# Patient Record
Sex: Male | Born: 1950
Health system: Southern US, Community
[De-identification: ages and names within clinical notes are randomized; demographics above are authoritative.]

## PROBLEM LIST (undated history)

## (undated) DIAGNOSIS — C7800 Secondary malignant neoplasm of unspecified lung: Secondary | ICD-10-CM

## (undated) DIAGNOSIS — C801 Malignant (primary) neoplasm, unspecified: Secondary | ICD-10-CM

## (undated) DIAGNOSIS — C787 Secondary malignant neoplasm of liver and intrahepatic bile duct: Secondary | ICD-10-CM

---

## 2018-07-20 DIAGNOSIS — K625 Hemorrhage of anus and rectum: Secondary | ICD-10-CM | POA: Diagnosis not present

## 2018-07-20 DIAGNOSIS — K5900 Constipation, unspecified: Secondary | ICD-10-CM | POA: Diagnosis not present

## 2018-07-20 DIAGNOSIS — K6289 Other specified diseases of anus and rectum: Secondary | ICD-10-CM | POA: Diagnosis not present

## 2018-07-28 ENCOUNTER — Other Ambulatory Visit: Payer: Self-pay | Admitting: Gastroenterology

## 2018-07-28 ENCOUNTER — Ambulatory Visit
Admission: RE | Admit: 2018-07-28 | Discharge: 2018-07-28 | Disposition: A | Discharge status: Home / Self Care | Payer: Self-pay | Source: Ambulatory Visit | Attending: Gastroenterology | Admitting: Gastroenterology

## 2018-07-28 DIAGNOSIS — R933 Abnormal findings on diagnostic imaging of other parts of digestive tract: Secondary | ICD-10-CM

## 2018-07-28 DIAGNOSIS — R918 Other nonspecific abnormal finding of lung field: Secondary | ICD-10-CM | POA: Diagnosis not present

## 2018-07-28 DIAGNOSIS — K769 Liver disease, unspecified: Secondary | ICD-10-CM | POA: Diagnosis not present

## 2018-07-28 DIAGNOSIS — K921 Melena: Secondary | ICD-10-CM | POA: Diagnosis not present

## 2018-07-28 DIAGNOSIS — D122 Benign neoplasm of ascending colon: Secondary | ICD-10-CM | POA: Diagnosis not present

## 2018-07-28 DIAGNOSIS — K5900 Constipation, unspecified: Secondary | ICD-10-CM | POA: Diagnosis not present

## 2018-07-28 DIAGNOSIS — K625 Hemorrhage of anus and rectum: Secondary | ICD-10-CM | POA: Diagnosis not present

## 2018-07-28 DIAGNOSIS — K635 Polyp of colon: Secondary | ICD-10-CM | POA: Diagnosis not present

## 2018-07-28 DIAGNOSIS — C20 Malignant neoplasm of rectum: Secondary | ICD-10-CM | POA: Diagnosis not present

## 2018-07-28 MED ORDER — IOPAMIDOL (ISOVUE-300) INJECTION 61%
125.0000 mL | Freq: Once | INTRAVENOUS | Status: AC | PRN
  Administered 2018-07-28: 125 mL via INTRAVENOUS

## 2018-08-03 ENCOUNTER — Telehealth: Payer: Self-pay | Admitting: Hematology

## 2018-08-03 NOTE — Telephone Encounter (Signed)
Lft vm to schedule an appt

## 2018-08-04 ENCOUNTER — Inpatient Hospital Stay: Payer: BLUE CROSS/BLUE SHIELD | Attending: Hematology | Admitting: Hematology

## 2018-08-04 ENCOUNTER — Encounter: Payer: Self-pay | Admitting: *Deleted

## 2018-08-04 ENCOUNTER — Encounter: Payer: Self-pay | Admitting: Hematology

## 2018-08-04 VITALS — BP 180/110 | HR 82 | Temp 98.7°F | Resp 17 | Ht 73.0 in | Wt 253.7 lb

## 2018-08-04 DIAGNOSIS — C20 Malignant neoplasm of rectum: Secondary | ICD-10-CM | POA: Diagnosis not present

## 2018-08-04 DIAGNOSIS — C787 Secondary malignant neoplasm of liver and intrahepatic bile duct: Secondary | ICD-10-CM

## 2018-08-04 DIAGNOSIS — Z7189 Other specified counseling: Secondary | ICD-10-CM

## 2018-08-04 DIAGNOSIS — Z006 Encounter for examination for normal comparison and control in clinical research program: Secondary | ICD-10-CM

## 2018-08-04 DIAGNOSIS — Z23 Encounter for immunization: Secondary | ICD-10-CM | POA: Diagnosis not present

## 2018-08-04 MED ORDER — INFLUENZA VAC SPLIT QUAD 0.5 ML IM SUSY
0.5000 mL | PREFILLED_SYRINGE | Freq: Once | INTRAMUSCULAR | Status: AC
  Administered 2018-08-04: 0.5 mL via INTRAMUSCULAR

## 2018-08-04 MED ORDER — LIDOCAINE-PRILOCAINE 2.5-2.5 % EX CREA: TOPICAL_CREAM | CUTANEOUS | 3 refills | Status: DC

## 2018-08-04 MED ORDER — INFLUENZA VAC SPLIT QUAD 0.5 ML IM SUSY
PREFILLED_SYRINGE | INTRAMUSCULAR | Status: AC
  Filled 2018-08-04: qty 0.5

## 2018-08-04 MED ORDER — ONDANSETRON HCL 8 MG PO TABS: 8.0000 mg | ORAL_TABLET | Freq: Two times a day (BID) | ORAL | 1 refills | Status: DC | PRN

## 2018-08-04 MED ORDER — PROCHLORPERAZINE MALEATE 10 MG PO TABS: 10.0000 mg | ORAL_TABLET | Freq: Four times a day (QID) | ORAL | 1 refills | Status: DC | PRN

## 2018-08-04 MED ORDER — CAPECITABINE 500 MG PO TABS: 850.0000 mg/m2 | ORAL_TABLET | Freq: Two times a day (BID) | ORAL | 0 refills | Status: DC

## 2018-08-04 MED ORDER — ACETAMINOPHEN-CODEINE #3 300-30 MG PO TABS: 1.0000 | ORAL_TABLET | ORAL | 0 refills | Status: DC | PRN

## 2018-08-04 NOTE — Progress Notes (Signed)
[place]  [phone] [phone]) [phone]  Clinic New Consult Note   Patient Care Team: Merry Proud, MD as PCP - General (Internal Medicine)   Date of Service:  08/04/2018   REFERRAL PHYSICIAN: DR. Benson Norway   CHIEF COMPLAINTS/PURPOSE OF CONSULTATION:  Newly diagnosed rectal Cancer   HISTORY OF PRESENTING ILLNESS:  Billy Mendoza 67 y.o. male is a here because of newly diagnosed rectal cancer. The patient was referred by Dr. Benson Norway. The patient presents to the clinic today accompanied by his wife Billy Mendoza, who is a Marine scientist.  He presented with rectal bleeding for one year, initially was on and off, overall mild, he thought was hemorrhoids.  It has been getting worse lately in the past 2 months, he has constipation also. He has also developed some rectal pain, constant, 4/10 on pain scale, on tylenol 2-4 times a day, worse at night, which wakes him up, but he is able to fall back sleep. No nausea, abdominal pain or bloating.  He also reports frequent night sweats in the past week, no fever or chills.  He had a dry cough last week, mild, improved now, he took some Mucinex last week. He had intentional weight loss for 25 lbs over 3 months.  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   Due to the worsening rectal bleeding, he was referred to gastroenterologist Dr. Almyra Free, underwent colonoscopy on July 28, 2018.  It showed a malignant partially obstructing tumor in the rectum, 8 cm from anal verge, biopsied.  Labs showed invasive adenocarcinoma.  He underwent staging CT chest, abdomen and pelvis with contrast, which unfortunately showed multiple small lung nodules, and several liver lesions, highly suspicious for metastasis.  He has no significant past medical history.  He is married, lives with his wife.  He has 2 adopted daughters, who lives in  [place].  They come in to visit him in a few weeks.  MEDICAL HISTORY:  History reviewed. No pertinent past medical history.  SURGICAL HISTORY: History reviewed. No pertinent surgical history.  SOCIAL HISTORY: Social History   Socioeconomic History  . Marital status: Married    Spouse name: Not on file  . Number of children: Not on file  . Years of education: Not on file  . Highest education level: Not on file  Occupational History  . Not on file  Social Needs  . Financial resource strain: Not on file  . Food insecurity:    Worry: Not on file    Inability: Not on file  . Transportation needs:    Medical: Not on file    Non-medical: Not on file  Tobacco Use  . Smoking status: Former Smoker    Years: 10.00    Types: Cigarettes, Cigars  . Smokeless tobacco: Never Used  . Tobacco comment: quit in Sep 2019   Substance and Sexual Activity  . Alcohol use: Yes    Alcohol/week: 1.0 - 2.0 standard drinks    Types: 1 - 2 Shots of liquor per week  . Drug use: Not on file  . Sexual activity: Not on file  Lifestyle  . Physical activity:    Days per week: Not on file    Minutes per session: Not on file  . Stress: Not on file  Relationships  . Social connections:    Talks on phone: Not on file    Gets together:  Not on file    Attends religious service: Not on file    Active member of club or organization: Not on file    Attends meetings of clubs or organizations: Not on file    Relationship status: Not on file  . Intimate partner violence:    Fear of current or ex partner: Not on file    Emotionally abused: Not on file    Physically abused: Not on file    Forced sexual activity: Not on file  Other Topics Concern  . Not on file  Social History Narrative  . Not on file    FAMILY HISTORY: Family History  Problem Relation Age of Onset  . Cancer Mother 43       breast cancer  . Atrial fibrillation Father   . Heart failure Father   . Cancer Maternal Grandmother         lung cancer     ALLERGIES:  has No Known Allergies.  MEDICATIONS:  Current Outpatient Medications  Medication Sig Dispense Refill  . acetaminophen (TYLENOL) 325 MG tablet Take 650 mg by mouth every 6 (six) hours as needed.    Marland Kitchen ibuprofen (ADVIL,MOTRIN) 200 MG tablet Take 200 mg by mouth every 6 (six) hours as needed.    . polyethylene glycol (MIRALAX / GLYCOLAX) packet Take 17 g by mouth daily.    Marland Kitchen acetaminophen-codeine (TYLENOL #3) 300-30 MG tablet Take 1 tablet by mouth every 4 (four) hours as needed for moderate pain. 20 tablet 0   No current facility-administered medications for this visit.     REVIEW OF SYSTEMS:    Constitutional: Denies fevers, chills or abnormal night sweats (+) mild fatigue, and 25 pounds intentional weight loss Eyes: Denies blurriness of vision, double vision or watery eyes Ears, nose, mouth, throat, and face: Denies mucositis or sore throat Respiratory: Deniesyspnea or wheezes, mild dry cough last week  Cardiovascular: Denies palpitation, chest discomfort or lower extremity swelling Gastrointestinal:  See HPI  Skin: Denies abnormal skin rashes Lymphatics: Denies new lymphadenopathy or easy bruising Neurological:Denies numbness, tingling or new weaknesses Behavioral/Psych: Mood is stable, no new changes  All other systems were reviewed with the patient and are negative.  PHYSICAL EXAMINATION: ECOG PERFORMANCE STATUS: 1 - Symptomatic but completely ambulatory  Vitals:   08/04/18 1445  BP: (!) 180/110  Pulse: 82  Resp: 17  Temp: 98.7 F (37.1 C)  SpO2: 95%   Filed Weights   08/04/18 1445  Weight: 253 lb 11.2 oz (115.1 kg)   GENERAL:alert, no distress and comfortable SKIN: skin color, texture, turgor are normal, no rashes or significant lesions EYES: normal, conjunctiva are pink and non-injected, sclera clear OROPHARYNX:no exudate, no erythema and lips, buccal mucosa, and tongue normal  NECK: supple, thyroid normal size, non-tender, without  nodularity LYMPH:  no palpable lymphadenopathy in the cervical, axillary or inguinal LUNGS: clear to auscultation and percussion with normal breathing effort HEART: regular rate & rhythm and no murmurs and no lower extremity edema ABDOMEN:abdomen soft, non-tender and normal bowel sounds Musculoskeletal:no cyanosis of digits and no clubbing  PSYCH: alert & oriented x 3 with fluent speech NEURO: no focal motor/sensory deficits  LABORATORY DATA:  I have reviewed the data as listed Outside lab on July 28, 2018  CEA 108 WBC 8.5, hemoglobin 12.7, hematocrit 36.6, MCV 91, platelet 404  PATHOLOGY   Biopsy 07/28/18 Final Microscopic Diagnosis A.Colon, Ascending, Polyp:  -Fragments of tubular adenoma -No high-grade dysplasia of malignancy.  B. Rectum, Mass, Polyp:  -  Invasive adenocarcinoma, moderately - poorly differentiatred.        PROCEDURES  Colonoscopy by Dr. Benson Norway 07/28/18 IMPRESSION:  -one 16m polyp in the ascending colon, removed with a cold snare. Resected and retrieved. -Malignant partiallt obstructing tumur in teh rectum. Biopsied -Diverticulosis in teh sigmoid colon.     RADIOGRAPHIC STUDIES: I have personally reviewed the radiological images as listed and agreed with the findings in the report. Ct Chest W Contrast  Result Date: 07/28/2018 CLINICAL DATA:  Patient with mass found on colonoscopy. EXAM: CT CHEST, ABDOMEN, AND PELVIS WITH CONTRAST TECHNIQUE: Multidetector CT imaging of the chest, abdomen and pelvis was performed following the standard protocol during bolus administration of intravenous contrast. [place] [address] Results: Creatinine 0.8 mg/dL. CONTRAST:  1265mISOVUE-300 IOPAMIDOL (ISOVUE-300) INJECTION 61% COMPARISON:  None. FINDINGS: CT CHEST FINDINGS Cardiovascular: Normal heart size. No pericardial effusion. Thoracic aortic vascular calcifications. Mediastinum/Nodes: No axillary adenopathy.  There is a 2.4 cm subcarinal lymph node (image 34; series 2). There is a 2.6 cm right hilar node (image 31; series 2). Normal appearance of the esophagus. There is a 1.3 cm left paratracheal node (image 25; series 2). Lungs/Pleura: Central airways are patent. Multiple bilateral pulmonary nodules are demonstrated. Reference subpleural 0.5 cm left lower lobe nodule (image 73; series 4). 0.3 cm nodule in the lingula (image 80; series 4). 0.8 cm right middle lobe nodule (image 88; series 4). 1.0 cm right lower lobe nodule (image 101; series 4). 0.7 cm right lower lobe nodule (image 110; series 4). 3.6 x 2.2 cm masslike area of consolidation subpleural right lower lobe (image 118; series 4). No pleural effusion or pneumothorax. Musculoskeletal: No aggressive or acute appearing osseous lesions. CT ABDOMEN PELVIS FINDINGS Hepatobiliary: Liver is normal in size and contour. Multiple low-attenuation lesions are demonstrated throughout the left and right hepatic lobes. Reference 1.8 x 1.9 cm lesion left hepatic lobe (image 55; series 2). Reference 7.2 x 5.6 cm lesion hepatic dome (image 45; series 2). Reference 1.8 x 2.1 cm lesion right hepatic lobe (image 58; series 2). Gallbladder is unremarkable. No intrahepatic or extrahepatic biliary ductal dilatation. Pancreas: Unremarkable Spleen: Unremarkable Adrenals/Urinary Tract: Normal adrenal glands. Kidneys enhance symmetrically with contrast. Multiple bilateral renal cysts are demonstrated. Additionally there are subcentimeter too small to characterize low-attenuation renal lesions. There is a 2.3 cm exophytic lesion off the interpolar region of the right kidney (image 72; series 2) with an internal density of 17 Hounsfield units, favored represent a mildly complicated cyst. No hydronephrosis. Urinary bladder is unremarkable. Stomach/Bowel: There is a heterogeneously enhancing mass involving the rectum which measures 5.0 x 3.5 cm (image 121; series 2). There is a 1.0 cm  pericolonic lymph node (image 116; series 2). There multiple smaller pericolonic nodes. Stranding and small amount of fluid about the rectum within the pelvis. No upstream bowel obstruction. Normal appendix. No evidence for small bowel obstruction. No free fluid or free intraperitoneal air. Normal morphology of the stomach. Vascular/Lymphatic: Normal caliber abdominal aorta. Peripheral calcified atherosclerotic plaque. No retroperitoneal lymphadenopathy. Reproductive: Heterogeneous calcifications in the prostate. Other: Small bilateral fat containing inguinal hernias. Musculoskeletal: Lumbar spine degenerative changes. No aggressive or acute appearing osseous lesions. IMPRESSION: 1. Heterogeneous mass at the level of the rectum most compatible with primary colonic carcinoma. There is adjacent fat stranding and small lymph nodes within the pelvis. 2. Multiple lesions within the liver compatible with hepatic metastatic disease. 3. Multiple pulmonary nodules compatible with pulmonary  metastatic disease. 4. Mediastinal adenopathy compatible with metastatic adenopathy. 5. There is a masslike area of subpleural consolidation within the right lower lobe which may represent metastatic disease, infectious/inflammatory process or potentially primary pulmonary malignancy. 6. There is a 2.3 cm exophytic lesion off the interpolar region the right kidney with internal density greater than that of fluid which may represent a complicated cyst. Consider performing follow-up exam with pre and post contrast sequences for definitive characterization. Electronically Signed   By: Lovey Newcomer M.D.   On: 07/28/2018 16:44   Ct Abdomen Pelvis W Contrast  Result Date: 07/28/2018 CLINICAL DATA:  Patient with mass found on colonoscopy. EXAM: CT CHEST, ABDOMEN, AND PELVIS WITH CONTRAST TECHNIQUE: Multidetector CT imaging of the chest, abdomen and pelvis was performed following the standard protocol during bolus administration of intravenous  contrast. Creatinine was obtained on site at [facility] at [address] Results: Creatinine 0.8 mg/dL. CONTRAST:  152m ISOVUE-300 IOPAMIDOL (ISOVUE-300) INJECTION 61% COMPARISON:  None. FINDINGS: CT CHEST FINDINGS Cardiovascular: Normal heart size. No pericardial effusion. Thoracic aortic vascular calcifications. Mediastinum/Nodes: No axillary adenopathy. There is a 2.4 cm subcarinal lymph node (image 34; series 2). There is a 2.6 cm right hilar node (image 31; series 2). Normal appearance of the esophagus. There is a 1.3 cm left paratracheal node (image 25; series 2). Lungs/Pleura: Central airways are patent. Multiple bilateral pulmonary nodules are demonstrated. Reference subpleural 0.5 cm left lower lobe nodule (image 73; series 4). 0.3 cm nodule in the lingula (image 80; series 4). 0.8 cm right middle lobe nodule (image 88; series 4). 1.0 cm right lower lobe nodule (image 101; series 4). 0.7 cm right lower lobe nodule (image 110; series 4). 3.6 x 2.2 cm masslike area of consolidation subpleural right lower lobe (image 118; series 4). No pleural effusion or pneumothorax. Musculoskeletal: No aggressive or acute appearing osseous lesions. CT ABDOMEN PELVIS FINDINGS Hepatobiliary: Liver is normal in size and contour. Multiple low-attenuation lesions are demonstrated throughout the left and right hepatic lobes. Reference 1.8 x 1.9 cm lesion left hepatic lobe (image 55; series 2). Reference 7.2 x 5.6 cm lesion hepatic dome (image 45; series 2). Reference 1.8 x 2.1 cm lesion right hepatic lobe (image 58; series 2). Gallbladder is unremarkable. No intrahepatic or extrahepatic biliary ductal dilatation. Pancreas: Unremarkable Spleen: Unremarkable Adrenals/Urinary Tract: Normal adrenal glands. Kidneys enhance symmetrically with contrast. Multiple bilateral renal cysts are demonstrated. Additionally there are subcentimeter too small to characterize low-attenuation renal lesions. There is a 2.3 cm exophytic  lesion off the interpolar region of the right kidney (image 72; series 2) with an internal density of 17 Hounsfield units, favored represent a mildly complicated cyst. No hydronephrosis. Urinary bladder is unremarkable. Stomach/Bowel: There is a heterogeneously enhancing mass involving the rectum which measures 5.0 x 3.5 cm (image 121; series 2). There is a 1.0 cm pericolonic lymph node (image 116; series 2). There multiple smaller pericolonic nodes. Stranding and small amount of fluid about the rectum within the pelvis. No upstream bowel obstruction. Normal appendix. No evidence for small bowel obstruction. No free fluid or free intraperitoneal air. Normal morphology of the stomach. Vascular/Lymphatic: Normal caliber abdominal aorta. Peripheral calcified atherosclerotic plaque. No retroperitoneal lymphadenopathy. Reproductive: Heterogeneous calcifications in the prostate. Other: Small bilateral fat containing inguinal hernias. Musculoskeletal: Lumbar spine degenerative changes. No aggressive or acute appearing osseous lesions. IMPRESSION: 1. Heterogeneous mass at the level of the rectum most compatible with primary colonic carcinoma. There is adjacent fat stranding and small lymph nodes  within the pelvis. 2. Multiple lesions within the liver compatible with hepatic metastatic disease. 3. Multiple pulmonary nodules compatible with pulmonary metastatic disease. 4. Mediastinal adenopathy compatible with metastatic adenopathy. 5. There is a masslike area of subpleural consolidation within the right lower lobe which may represent metastatic disease, infectious/inflammatory process or potentially primary pulmonary malignancy. 6. There is a 2.3 cm exophytic lesion off the interpolar region the right kidney with internal density greater than that of fluid which may represent a complicated cyst. Consider performing follow-up exam with pre and post contrast sequences for definitive characterization. Electronically Signed    By: Lovey Newcomer M.D.   On: 07/28/2018 16:44    CT CAP W Contrast 07/28/18  IMPRESSION: 1. Heterogeneous mass at the level of the rectum most compatible with primary colonic carcinoma. There is adjacent fat stranding and small lymph nodes within the pelvis.  2. Multiple lesions within the liver compatible with hepatic metastatic disease 3. Multiple pulmonary nodules compatible with pulmonary metastatic disease 4. Mediastinal adenopathy compatible with metastatic adenopathy.  5. There is a masslike area of subpleural consolidation within the right lower lobe which may represent metastatic disease, infectious.inflammatory process or potentially primary pulmonary malignancy.  6. There is a 2.3 cm exophytic lesion off the interpolar region of the right kidney with internal density greater than that of fluid which may represent a complicated cyst. Consider performing follow-up exam with pre and post contrast sequences for definitive characteristics.      ASSESSMENT & PLAN:  Billy Mendoza is a 67 y.o. male significant past medical history, presented with rectal bleeding for 1 year, and recent rectal pain and weight loss.    1.  Proximal rectal adenocarcinoma, likely metastatic to lungs, nodes and liver -I reviewed his outside colonoscopy, biopsy results, and his staging CT scan images in person with patient and his wife in details. -Unfortunately CT scan is highly suspicious for bilateral lung, node and multiple liver metastasis. -The proximal rectal mass is partially obstructing, he does not have significant symptoms of bowel obstruction, rectal bleeding is overall mild. -I recommend a liver biopsy to confirm metastasis -He is scheduled to see radiation oncologist Dr. Lisbeth Renshaw next week.  Given no significant bowel obstruction, severe pain or bleeding, he may not need immediate surgery or radiation for local symptoms related to his rectal tumor.  We did discuss the indication for radiation, and  surgery, such as worsening bowel obstruction, or severe bleeding or pain. -If the liver biopsy confirms metastasis, I recommend him to start with systemic chemotherapy, with a goal of disease control, and to prolong his life.  If he responds well to the chemotherapy, his rectal symptoms will likely also improve. -We review the nature history of metastatic colorectal cancer, the survival with or without treatment, We discussed his cancer is not curable, the goal of therapy is palliative. -We discussed the role of local therapy in metastatic colorectal cancer, such as SBRT to lung lesions, liver ablation or surgery, if he has dramatic response to chemotherapy, we will revisit this option of local therapy.  However giving the metastasis to the both lung and liver, especially diffuse liver metastasis, he is unlikely a candidate for local therapy. -I request his tumor to be tested for MMR, and foundation 1, to see if he is a candidate for immunotherapy or targeted therapy. -I recommend him to start chemotherapy in about 2 weeks.  We discussed chemo, such as FOLFOX, CAPOX, FOLFIRI, or FOLFOXIRI.  Tensional side effects, logistics discussed with  patient.  He strongly prefers oral chemo, and less frequent treatment, and opted CAPOX.  --Chemotherapy consent: Side effects including but does not not limited to, fatigue, nausea, vomiting, diarrhea, hair loss, neuropathy, fluid retention, renal and kidney dysfunction, neutropenic fever, needed for blood transfusion, bleeding, coronary artery spasm, heart attack, heart failure, were discussed with patient in great detail. He agrees to proceed. -The goal of therapy is palliative, along his life, and improve his cancer related symptoms. -Port placement by IR next week.  We discussed the benefit, and potential risks of port, after lengthy discussion, and with wife's encouragement, he agreed -We discussed low residue diet, to avoid bowel obstruction.  -We discussed using  stool softener and laxative as needed, to avoid constipation. -Patient consult next week -We also discussed his stress level, cancer related to depression and anxiety.  He has good support from his wife, feels less anxious this week than when he was diagnosed with cancer last week.  I offered social work consult, he declined. -will see him back with first cycle chemo   2. Rectal pain, constipation and bleeding  -He is taking Tylenol 2-4 times a day, pain is overall manageable.  He does not have occasional severe pain, wakes him up at night. -Prescribed Tylenol 3, to use as needed for severe pain -He is to use stool softener, laxative such as MiraLAX or Senokot, to avoid constipation -While she is bleeding closely.  We will check his iron level   3. Goal of care discussion  -We again discussed the incurable nature of his cancer, and the overall poor prognosis, especially if he does not have good response to chemotherapy or progress on chemo -The patient understands the goal of care is palliative. -He is full code for now    PLAN:  -IR liver biopsy and port placement next week -Chemo class, lab, dietitian consult next week -Lab, flush, follow-up, and chemo CAPOX starting the week of 11/11, I will prescribe Xeloda     Orders Placed This Encounter  Procedures  . IR Perc Tun Perit Cath W/Port    Standing Status:   Future    Standing Expiration Date:   10/05/2019    Order Specific Question:   Reason for exam:    Answer:   chemotherapy    Order Specific Question:   Preferred Imaging Location?    Answer:   [facility]  . US BIOPSY (LIVER)    Standing Status:   Future    Standing Expiration Date:   10/05/2019    Order Specific Question:   Lab orders requested (DO NOT place separate lab orders, these will be automatically ordered during procedure specimen collection):    Answer:   Surgical Pathology    Order Specific Question:   Reason for Exam (SYMPTOM  OR DIAGNOSIS REQUIRED)      Answer:   confirm metastasis    Order Specific Question:   Preferred imaging location?    Answer:   [facility]    All questions were answered. The patient knows to call the clinic with any problems, questions or concerns. I spent 55 minutes counseling the patient face to face. The total time spent in the appointment was 60 minutes and more than 50% was on counseling.     Truitt Merle, MD 08/04/2018 5:38 PM  I, Joslyn Devon, am acting as scribe for Truitt Merle, MD.   I have reviewed the above documentation for accuracy and completeness, and I agree with the above.

## 2018-08-04 NOTE — Progress Notes (Signed)
START ON PATHWAY REGIMEN - Colorectal     A cycle is every 21 days:     Capecitabine      Oxaliplatin      Bevacizumab-xxxx   **Always confirm dose/schedule in your pharmacy ordering system**  Patient Characteristics: Distant Metastases, First Line, Nonsurgical Candidate, KRAS Mutation Positive/Unknown, BRAF Wild-Type/Unknown, PS = 0,1; Bevacizumab Eligible Therapeutic Status: Distant Metastases BRAF Mutation Status: Awaiting Test Results KRAS/NRAS Mutation Status: Awaiting Test Results Line of Therapy: First Line Performance Status: PS = 0, 1 Bevacizumab Eligibility: Eligible Intent of Therapy: Non-Curative / Palliative Intent, Discussed with Patient

## 2018-08-04 NOTE — Research (Deleted)
EXACT SCIENCES 201801 BLOOD SAMPLE COLLECTION TO EVALUATE BIOMARKERS IN SUBJECTSWITH UNTREATED SOLID TUMORS.  Dr. Burr Medico referred patient for the above study. Met with patient and his wife briefly inexam room. Informed him that this study involves aone time collection of bloodand demographic/medical history information to beused for research. Informed patient that participation is completely voluntary.Blood must be collected prior to starting any cancer treatment or surgery. Patients will be given a [employer] gift card for their participation. Patient stated he was interested in learning more about this study. I providedhim with the [facility] embedded consent form for protocol version2.0, IRB approved 11/17/17. Informed him consent has to be signed prior to drawing blood for the study. Patient denied any questions at this time and states it is ok for research nurse to call him after his schedule is made to arrange a time to meet before his lab appointment next week. Gave patient my business card and a clinical researchinformational brochure. Encouraged him to call research nurse if any questions before our next contact. Theyverbalized understanding. Thanked patient very much for his time today and willingness to consider this study.  Foye Spurling, BSN, RN Clinical Research Nurse 08/04/2018 4:30 PM  Called patient to follow up after reviewing his schedule.  He has liver biopsy scheduled for 11/12 which is only 2 days before his lab appt on 11/14.  We would not be able to collect blood on 11/14 as study protocol requires at least 7 days between biopsy and blood collection.  Patient verbalized understanding and offered to have his blood drawn for the research study before his appt with Dr. Lisbeth Renshaw next week on Tues 11/5.  Patient agreed to come in one hour prior to Jack appt to consent for study and have blood drawn.  Informed patient I will meet him in the lobby of the cancer center at 1:30 pm on  Tuesday to review and sign consent prior to blood draw.  Thanked patient very much for his willingness to participate in this study.  Gave him my phone number and asked him to call if any problems or questions prior to our appt on Tuesday 08/09/18 at 1:30 pm.  He verbalized understanding.  Foye Spurling, BSN, RN Clinical Research Nurse 08/05/2018 4:20 PM   Met with patient and his wife in private exam room prior to lab appointment today. He was reminded that study participation is voluntary andit involves drawing 5 tubes of blood and sharing some medical and demographic information. Spent 10 minutes reviewing consent form for protocol version 2.0, IRB approved 11/17/17, with patient in it's entirety, including the purpose, risks and possible benefits of this study.  Patient denied any questions and stated he wants to enroll on the study.  Patient signed and dated the [place] embedded consent form. After patients signature, this research nurse spent 5 minutes asking patient family cancer history and his tobacco and alcoholhistory for the study. Thanked patient very much for his participation in this study. Patient was then escorted to lobby to register for his lab appointment.  Research Assistant, Farris Has, supplied research tubes and instructions to draw labs per protocol to Lab staff. She will also give patient the [employer] gift card after his blood is drawn today and a copy of his signed consent form.  Patient meets all eligibility criteria for this study and will be enrolled. Eligibility confirmed by second research nurse, Otilio Miu. Dr. Burr Medico also agrees patient meets all eligibility and none of the exclusion criteria to be enrolled  on this study.  Foye Spurling, BSN, RN Clinical Research Nurse 08/09/2018 2:00 PM   Research blood was drawn per protocol using Kit #                   , patient tolerated well without any adverse events. Blood processed and packed for shipping to study.  Foye Spurling, BSN, RN Clinical Research Nurse 08/09/2018 4:00 PM

## 2018-08-04 NOTE — Progress Notes (Signed)
  Oncology Nurse Navigator Documentation  Navigator Location: [facility] (08/04/18 1610) Referral date to RadOnc/MedOnc: 08/03/18 (08/04/18 1610) )Navigator Encounter Type: Initial MedOnc (08/04/18 1610)   Met with Billy Mendoza and his wife. Explained role of nurse navigator.  Contact names and phone numbers were provided for entire [facility] team.  Information on pelvic floor rehab provided.  No barriers to care identified at present time.  Will continue to follow as needed Abnormal Finding Date: 07/28/18 (08/04/18 1610)                 Patient Visit Type: MedOnc;Initial (08/04/18 1610) Treatment Phase: Abnormal Scans (08/04/18 1610) Barriers/Navigation Needs: No barriers at this time (08/04/18 1610)                Acuity: Level 2 (08/04/18 1610)   Acuity Level 2: Initial guidance, education and coordination as needed (08/04/18 1610)     Time Spent with Patient: 15 (08/04/18 1610)

## 2018-08-08 NOTE — Progress Notes (Signed)
GI Location of Tumor / Histology: Rectal Cancer  Billy Mendoza presented with rectal bleeding for one year, initially was on and off, overall mild.  Bleeding getting worse over the past 2 months associated with constipation and rectal pain.  He is also having frequent night sweats.  CT CAP 07/28/2018: Heterogeneous mass at the level of the rectum most compatible with primary colonic carcinoma.  There is adjacent fat stranding and small lymph nodes within the pelvis.  Multiple small lung nodules and several liver lesions, highly suspicious for metastasis.  Colonoscopy 07/28/2018: malignant partially obstructing tumor in the rectum, 8 cm from the anal verge.  Biopsies of   Past/Anticipated interventions by GI, if any: Dr. Benson Norway 08/02/2018 -Bleeding per rectum, constipation, proctalgia. -His current symptoms may be from an anal fissure, he decided to forego the rectal exam and he will have the exam at the time of the colonoscopy. -Recommendations will be finalized pending his procedure findings. -Plan: colonoscopy   Past/Anticipated interventions by surgeon, if any:   Past/Anticipated interventions by medical oncology, if any: Dr. Burr Medico 08/04/2018 - Unfortunately CT scan is highly suspicious for bilateral lung, node and multiple liver metastasis. -The proximal rectal mass is partially obstructing, he does not have significant symptoms of bowel obstruction, rectal bleeding is overall mild. -I recommend a liver biopsy to confirm metastasis. -He is scheduled to see radiation oncologist, given no significant bowel obstruction, severe pain or bleeding, he may not need immediate surgery or radiation for local symptoms related to his rectal tumor. -We did discuss the indication for radiation, and surgery, such as worsening bowel obstruction, or severe bleeding or pain. -If the liver biopsy confirms metastasis, I recommend him to start with systemic chemotherapy. -We discussed the role of local therapy  in metastatic colorectal cancer, such as SBRT to lung lesions, liver ablation or surgery, if he has dramatic response to chemotherapy, we will revisit this option of local therapy.  However giving the metastasis to the both lung and liver, especially diffuse liver metastasis, he is unlikely a candidate for local therapy. -I recommend him to start chemotherapy in about 2 weeks.  He strongly prefers oral chemo, and less frequent treatment, and opted CAPOX starting the week of 11/11, I will prescribe Xeloda.    Weight changes, if any: None that wasn't meant for, changed diet.  Bowel/Bladder complaints, if any: Some constipation, resolving.  Changes in diet, using miralax BID, benefiber in am.  Nausea / Vomiting, if any: No  Pain issues, if any:  4/10 without pain medication   Any blood per rectum: Ocassional bleeding, sometimes bright red, other times darker.  Bleeding has decreased.  BP (!) 153/89 (Patient Position: Sitting)   Pulse 88   Temp 99.2 F (37.3 C) (Oral)   Resp 20   Ht 6\' 1"  (1.854 m)   Wt 245 lb 12.8 oz (111.5 kg)   SpO2 97%   BMI 32.43 kg/m   Wt Readings from Last 3 Encounters:  08/09/18 245 lb 12.8 oz (111.5 kg)  08/04/18 253 lb 11.2 oz (115.1 kg)   SAFETY ISSUES:  Prior radiation? No  Pacemaker/ICD? No  Possible current pregnancy? No  Is the patient on methotrexate? No  Current Complaints/Details:

## 2018-08-09 ENCOUNTER — Other Ambulatory Visit: Payer: Self-pay

## 2018-08-09 ENCOUNTER — Ambulatory Visit
Admission: RE | Admit: 2018-08-09 | Discharge: 2018-08-09 | Disposition: A | Discharge status: Home / Self Care | Payer: BLUE CROSS/BLUE SHIELD | Source: Ambulatory Visit | Attending: Radiation Oncology | Admitting: Radiation Oncology

## 2018-08-09 ENCOUNTER — Inpatient Hospital Stay: Payer: BLUE CROSS/BLUE SHIELD | Attending: Radiation Oncology

## 2018-08-09 ENCOUNTER — Encounter: Payer: Self-pay | Admitting: Radiation Oncology

## 2018-08-09 VITALS — BP 153/89 | HR 88 | Temp 99.2°F | Resp 20 | Ht 73.0 in | Wt 245.8 lb

## 2018-08-09 DIAGNOSIS — Z006 Encounter for examination for normal comparison and control in clinical research program: Secondary | ICD-10-CM

## 2018-08-09 DIAGNOSIS — Z87891 Personal history of nicotine dependence: Secondary | ICD-10-CM | POA: Insufficient documentation

## 2018-08-09 DIAGNOSIS — C20 Malignant neoplasm of rectum: Secondary | ICD-10-CM | POA: Diagnosis not present

## 2018-08-09 DIAGNOSIS — C787 Secondary malignant neoplasm of liver and intrahepatic bile duct: Secondary | ICD-10-CM | POA: Insufficient documentation

## 2018-08-09 DIAGNOSIS — Z808 Family history of malignant neoplasm of other organs or systems: Secondary | ICD-10-CM | POA: Diagnosis not present

## 2018-08-09 DIAGNOSIS — Z5111 Encounter for antineoplastic chemotherapy: Secondary | ICD-10-CM | POA: Insufficient documentation

## 2018-08-09 DIAGNOSIS — Z79899 Other long term (current) drug therapy: Secondary | ICD-10-CM | POA: Insufficient documentation

## 2018-08-09 DIAGNOSIS — K625 Hemorrhage of anus and rectum: Secondary | ICD-10-CM | POA: Insufficient documentation

## 2018-08-09 DIAGNOSIS — K5900 Constipation, unspecified: Secondary | ICD-10-CM | POA: Insufficient documentation

## 2018-08-09 DIAGNOSIS — D500 Iron deficiency anemia secondary to blood loss (chronic): Secondary | ICD-10-CM | POA: Insufficient documentation

## 2018-08-09 DIAGNOSIS — Z452 Encounter for adjustment and management of vascular access device: Secondary | ICD-10-CM | POA: Insufficient documentation

## 2018-08-09 LAB — RESEARCH LABS

## 2018-08-09 NOTE — Progress Notes (Signed)
Radiation Oncology         [phone] ________________________________  Name: Billy Mendoza        MRN: [id number]  Date of Service: 08/09/2018 DOB: September 28, 1951  NA:[id number], [employer], MD  Carol Ada, MD     REFERRING PHYSICIAN: Carol Ada, MD   DIAGNOSIS: The primary encounter diagnosis was Rectal cancer [facility]). A diagnosis of Rectal cancer metastasized to liver [facility]) was also pertinent to this visit.   HISTORY OF PRESENT ILLNESS: Billy Mendoza is a 67 y.o. male seen at the request of Dr. Burr Medico for a newly diagnosed rectal cancer. The patient has noticed intermittent rectal bleeding for about a year. He was evaluated by Dr. Benson Norway and colonoscopy on 07/28/18 revealed a near obstructing lesion in the rectum and a biopsy revealed a poorly differentiated adenocarcinoma. He had CT imaging of the C/A/P on 07/28/18 revealed concerns for bilateral lung, mediastinal, and liver disease as well as thickening of the rectum at the site of his known diagnosis. He is going to undergo ultrasound biopsy on 08/16/18. He has plans to begin capeox with Dr Burr Medico and to start this on 08/18/18. He comes today to discuss options of palliative radiotherapy.    PREVIOUS RADIATION THERAPY: No   PAST MEDICAL HISTORY: History reviewed. No pertinent past medical history.     PAST SURGICAL HISTORY:History reviewed. No pertinent surgical history.   FAMILY HISTORY:  Family History  Problem Relation Age of Onset  . Cancer Mother 30       breast cancer  . Atrial fibrillation Father   . Heart failure Father   . Cancer Maternal Grandmother        lung cancer      SOCIAL HISTORY:  reports that he has quit smoking. His smoking use included cigarettes and cigars. He quit after 10.00 years of use. He has never used smokeless tobacco. He reports that he drinks about 1.0 - 2.0 standard drinks of alcohol per week. He reports that he does not use drugs.   ALLERGIES: Patient has no known allergies.   MEDICATIONS:   Current Outpatient Medications  Medication Sig Dispense Refill  . acetaminophen (TYLENOL) 325 MG tablet Take 650 mg by mouth every 6 (six) hours as needed.    Marland Kitchen acetaminophen-codeine (TYLENOL #3) 300-30 MG tablet Take 1 tablet by mouth every 4 (four) hours as needed for moderate pain. 20 tablet 0  . capecitabine (XELODA) 500 MG tablet Take 4 tablets (2,000 mg total) by mouth 2 (two) times daily after a meal. Take on days 1-14 of chemotherapy. Repeat every 21 days. 112 tablet 0  . ibuprofen (ADVIL,MOTRIN) 200 MG tablet Take 200 mg by mouth every 6 (six) hours as needed.    . lidocaine-prilocaine (EMLA) cream Apply to affected area once 30 g 3  . ondansetron (ZOFRAN) 8 MG tablet Take 1 tablet (8 mg total) by mouth 2 (two) times daily as needed for refractory nausea / vomiting. Start on day 3 after chemotherapy. 30 tablet 1  . polyethylene glycol (MIRALAX / GLYCOLAX) packet Take 17 g by mouth daily.    . prochlorperazine (COMPAZINE) 10 MG tablet Take 1 tablet (10 mg total) by mouth every 6 (six) hours as needed (Nausea or vomiting). 30 tablet 1   No current facility-administered medications for this encounter.      REVIEW OF SYSTEMS: On review of systems, the patient reports that he is doing well overall. He reports dry cough but denies hemoptysis. He is taking mucinex currently. He denies  any chest pain, shortness of breath, fevers, chills, night sweats. He has lost about 25 pounds but was also trying to lose weight he describes significant constipation but has now been able to manage this with miralax and his pain with OTC tylenol and ibuprofen.  He denies any bladder disturbances, and denies abdominal pain, nausea or vomiting. He denies any new musculoskeletal or joint aches or pains. A complete review of systems is obtained and is otherwise negative.     PHYSICAL EXAM:  Wt Readings from Last 3 Encounters:  08/09/18 245 lb 12.8 oz (111.5 kg)  08/04/18 253 lb 11.2 oz (115.1 kg)   Temp  Readings from Last 3 Encounters:  08/09/18 99.2 F (37.3 C) (Oral)  08/04/18 98.7 F (37.1 C) (Oral)   BP Readings from Last 3 Encounters:  08/09/18 (!) 153/89  08/04/18 (!) 180/110   Pulse Readings from Last 3 Encounters:  08/09/18 88  08/04/18 82   Pain Assessment Pain Score: 0-No pain/10  In general this is a well appearing Caucasian male  in no acute distress. He's alert and oriented x4 and appropriate throughout the examination. Cardiopulmonary assessment is negative for acute distress and he exhibits normal effort.    ECOG = 1  0 - Asymptomatic (Fully active, able to carry on all predisease activities without restriction)  1 - Symptomatic but completely ambulatory (Restricted in physically strenuous activity but ambulatory and able to carry out work of a light or sedentary nature. For example, light housework, office work)  2 - Symptomatic, <50% in bed during the day (Ambulatory and capable of all self care but unable to carry out any work activities. Up and about more than 50% of waking hours)  3 - Symptomatic, >50% in bed, but not bedbound (Capable of only limited self-care, confined to bed or chair 50% or more of waking hours)  4 - Bedbound (Completely disabled. Cannot carry on any self-care. Totally confined to bed or chair)  5 - Death   Eustace Pen MM, Creech RH, Tormey DC, et al. [phone]). "Toxicity and response criteria of the [facility]". Aline Oncol. 5 (6): 649-55    LABORATORY DATA:  No results found for: WBC, HGB, HCT, MCV, PLT No results found for: NA, K, CL, CO2 No results found for: ALT, AST, GGT, ALKPHOS, BILITOT    RADIOGRAPHY: Ct Chest W Contrast  Result Date: 07/28/2018 CLINICAL DATA:  Patient with mass found on colonoscopy. EXAM: CT CHEST, ABDOMEN, AND PELVIS WITH CONTRAST TECHNIQUE: Multidetector CT imaging of the chest, abdomen and pelvis was performed following the standard protocol during bolus administration of  intravenous contrast. Creatinine was obtained on site at [place] at [address] Results: Creatinine 0.8 mg/dL. CONTRAST:  127mL ISOVUE-300 IOPAMIDOL (ISOVUE-300) INJECTION 61% COMPARISON:  None. FINDINGS: CT CHEST FINDINGS Cardiovascular: Normal heart size. No pericardial effusion. Thoracic aortic vascular calcifications. Mediastinum/Nodes: No axillary adenopathy. There is a 2.4 cm subcarinal lymph node (image 34; series 2). There is a 2.6 cm right hilar node (image 31; series 2). Normal appearance of the esophagus. There is a 1.3 cm left paratracheal node (image 25; series 2). Lungs/Pleura: Central airways are patent. Multiple bilateral pulmonary nodules are demonstrated. Reference subpleural 0.5 cm left lower lobe nodule (image 73; series 4). 0.3 cm nodule in the lingula (image 80; series 4). 0.8 cm right middle lobe nodule (image 88; series 4). 1.0 cm right lower lobe nodule (image 101; series 4). 0.7 cm right lower lobe nodule (image  110; series 4). 3.6 x 2.2 cm masslike area of consolidation subpleural right lower lobe (image 118; series 4). No pleural effusion or pneumothorax. Musculoskeletal: No aggressive or acute appearing osseous lesions. CT ABDOMEN PELVIS FINDINGS Hepatobiliary: Liver is normal in size and contour. Multiple low-attenuation lesions are demonstrated throughout the left and right hepatic lobes. Reference 1.8 x 1.9 cm lesion left hepatic lobe (image 55; series 2). Reference 7.2 x 5.6 cm lesion hepatic dome (image 45; series 2). Reference 1.8 x 2.1 cm lesion right hepatic lobe (image 58; series 2). Gallbladder is unremarkable. No intrahepatic or extrahepatic biliary ductal dilatation. Pancreas: Unremarkable Spleen: Unremarkable Adrenals/Urinary Tract: Normal adrenal glands. Kidneys enhance symmetrically with contrast. Multiple bilateral renal cysts are demonstrated. Additionally there are subcentimeter too small to characterize low-attenuation renal lesions. There is a 2.3  cm exophytic lesion off the interpolar region of the right kidney (image 72; series 2) with an internal density of 17 Hounsfield units, favored represent a mildly complicated cyst. No hydronephrosis. Urinary bladder is unremarkable. Stomach/Bowel: There is a heterogeneously enhancing mass involving the rectum which measures 5.0 x 3.5 cm (image 121; series 2). There is a 1.0 cm pericolonic lymph node (image 116; series 2). There multiple smaller pericolonic nodes. Stranding and small amount of fluid about the rectum within the pelvis. No upstream bowel obstruction. Normal appendix. No evidence for small bowel obstruction. No free fluid or free intraperitoneal air. Normal morphology of the stomach. Vascular/Lymphatic: Normal caliber abdominal aorta. Peripheral calcified atherosclerotic plaque. No retroperitoneal lymphadenopathy. Reproductive: Heterogeneous calcifications in the prostate. Other: Small bilateral fat containing inguinal hernias. Musculoskeletal: Lumbar spine degenerative changes. No aggressive or acute appearing osseous lesions. IMPRESSION: 1. Heterogeneous mass at the level of the rectum most compatible with primary colonic carcinoma. There is adjacent fat stranding and small lymph nodes within the pelvis. 2. Multiple lesions within the liver compatible with hepatic metastatic disease. 3. Multiple pulmonary nodules compatible with pulmonary metastatic disease. 4. Mediastinal adenopathy compatible with metastatic adenopathy. 5. There is a masslike area of subpleural consolidation within the right lower lobe which may represent metastatic disease, infectious/inflammatory process or potentially primary pulmonary malignancy. 6. There is a 2.3 cm exophytic lesion off the interpolar region the right kidney with internal density greater than that of fluid which may represent a complicated cyst. Consider performing follow-up exam with pre and post contrast sequences for definitive characterization.  Electronically Signed   By: Lovey Newcomer M.D.   On: 07/28/2018 16:44   Ct Abdomen Pelvis W Contrast  Result Date: 07/28/2018 CLINICAL DATA:  Patient with mass found on colonoscopy. EXAM: CT CHEST, ABDOMEN, AND PELVIS WITH CONTRAST TECHNIQUE: Multidetector CT imaging of the chest, abdomen and pelvis was performed following the standard protocol during bolus administration of intravenous contrast. Creatinine was obtained on site at [place] at [address] Results: Creatinine 0.8 mg/dL. CONTRAST:  151mL ISOVUE-300 IOPAMIDOL (ISOVUE-300) INJECTION 61% COMPARISON:  None. FINDINGS: CT CHEST FINDINGS Cardiovascular: Normal heart size. No pericardial effusion. Thoracic aortic vascular calcifications. Mediastinum/Nodes: No axillary adenopathy. There is a 2.4 cm subcarinal lymph node (image 34; series 2). There is a 2.6 cm right hilar node (image 31; series 2). Normal appearance of the esophagus. There is a 1.3 cm left paratracheal node (image 25; series 2). Lungs/Pleura: Central airways are patent. Multiple bilateral pulmonary nodules are demonstrated. Reference subpleural 0.5 cm left lower lobe nodule (image 73; series 4). 0.3 cm nodule in the lingula (image 80; series 4). 0.8 cm right middle lobe nodule (  image 88; series 4). 1.0 cm right lower lobe nodule (image 101; series 4). 0.7 cm right lower lobe nodule (image 110; series 4). 3.6 x 2.2 cm masslike area of consolidation subpleural right lower lobe (image 118; series 4). No pleural effusion or pneumothorax. Musculoskeletal: No aggressive or acute appearing osseous lesions. CT ABDOMEN PELVIS FINDINGS Hepatobiliary: Liver is normal in size and contour. Multiple low-attenuation lesions are demonstrated throughout the left and right hepatic lobes. Reference 1.8 x 1.9 cm lesion left hepatic lobe (image 55; series 2). Reference 7.2 x 5.6 cm lesion hepatic dome (image 45; series 2). Reference 1.8 x 2.1 cm lesion right hepatic lobe (image 58; series 2).  Gallbladder is unremarkable. No intrahepatic or extrahepatic biliary ductal dilatation. Pancreas: Unremarkable Spleen: Unremarkable Adrenals/Urinary Tract: Normal adrenal glands. Kidneys enhance symmetrically with contrast. Multiple bilateral renal cysts are demonstrated. Additionally there are subcentimeter too small to characterize low-attenuation renal lesions. There is a 2.3 cm exophytic lesion off the interpolar region of the right kidney (image 72; series 2) with an internal density of 17 Hounsfield units, favored represent a mildly complicated cyst. No hydronephrosis. Urinary bladder is unremarkable. Stomach/Bowel: There is a heterogeneously enhancing mass involving the rectum which measures 5.0 x 3.5 cm (image 121; series 2). There is a 1.0 cm pericolonic lymph node (image 116; series 2). There multiple smaller pericolonic nodes. Stranding and small amount of fluid about the rectum within the pelvis. No upstream bowel obstruction. Normal appendix. No evidence for small bowel obstruction. No free fluid or free intraperitoneal air. Normal morphology of the stomach. Vascular/Lymphatic: Normal caliber abdominal aorta. Peripheral calcified atherosclerotic plaque. No retroperitoneal lymphadenopathy. Reproductive: Heterogeneous calcifications in the prostate. Other: Small bilateral fat containing inguinal hernias. Musculoskeletal: Lumbar spine degenerative changes. No aggressive or acute appearing osseous lesions. IMPRESSION: 1. Heterogeneous mass at the level of the rectum most compatible with primary colonic carcinoma. There is adjacent fat stranding and small lymph nodes within the pelvis. 2. Multiple lesions within the liver compatible with hepatic metastatic disease. 3. Multiple pulmonary nodules compatible with pulmonary metastatic disease. 4. Mediastinal adenopathy compatible with metastatic adenopathy. 5. There is a masslike area of subpleural consolidation within the right lower lobe which may represent  metastatic disease, infectious/inflammatory process or potentially primary pulmonary malignancy. 6. There is a 2.3 cm exophytic lesion off the interpolar region the right kidney with internal density greater than that of fluid which may represent a complicated cyst. Consider performing follow-up exam with pre and post contrast sequences for definitive characterization. Electronically Signed   By: Lovey Newcomer M.D.   On: 07/28/2018 16:44       IMPRESSION/PLAN: 1. 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 Dr. Lisbeth Renshaw discusses the pathology findings and reviews the nature of advanced rectal cancers. We reviewed the role of palliative radiotherapy to the rectum if he were having progressive pain, bleeding, or symptoms of lower intestinal obstruction.  We discussed the risks, benefits, short, and long term effects of radiotherapy. Dr. Lisbeth Renshaw discusses the delivery and logistics of radiotherapy and anticipates a course of 2 weeks of radiotherapy. At the completion of our discussion, the patient is comfortable with starting chemotherapy without any palliative radiotherapy, but will let us and Dr. Burr Medico know if his symptoms change and if he were interested in proceeding. We also discussed potential options of consolidative treatment at a later date depending on his response to treatment.   In a visit lasting 60 minutes, greater than 50% of the time was spent face to  face discussing his case, and coordinating the patient's care.   The above documentation reflects my direct findings during this shared patient visit. Please see the separate note by Dr. Lisbeth Renshaw on this date for the remainder of the patient's plan of care.    Carola Rhine, PAC

## 2018-08-10 ENCOUNTER — Inpatient Hospital Stay: Payer: BLUE CROSS/BLUE SHIELD

## 2018-08-11 ENCOUNTER — Encounter: Payer: Self-pay | Admitting: *Deleted

## 2018-08-11 NOTE — Research (Signed)
EXACT SCIENCES 201801 BLOOD SAMPLE COLLECTION TO EVALUATE BIOMARKERS IN SUBJECTSWITH UNTREATED SOLID TUMORS.  Dr. Burr Medico referred patient for the above study. Met with patient and his wife briefly inexam room. Informed him that this study involves aone time collection of bloodand demographic/medical history information to beused for research. Informed patient that participation is completely voluntary.Blood must be collected prior to starting any cancer treatment or surgery. Patients will be given a [employer] gift card for their participation. Patient stated he was interested in learning more about this study. I providedhim with the [facility] embedded consent form for protocol version2.0, IRB approved 11/17/17. Informed him consent has to be signed prior to drawing blood for the study. Patient denied any questions at this time and states it is ok for research nurse to call him after his schedule is made to arrange a time to meet before his lab appointment next week. Gave patient my business card and a clinical researchinformational brochure. Encouraged him to call research nurse if any questions before our next contact. Theyverbalized understanding. Thanked patient very much for his time today and willingness to consider this study.  Foye Spurling, BSN, RN Clinical Research Nurse 08/04/2018 4:30 PM  Called patient to follow up after reviewing his schedule.  He has liver biopsy scheduled for 11/12 which is only 2 days before his lab appt on 11/14.  We would not be able to collect blood on 11/14 as study protocol requires at least 7 days between biopsy and blood collection.  Patient verbalized understanding and offered to have his blood drawn for the research study before his appt with Dr. Lisbeth Renshaw next week on Tues 11/5.  Patient agreed to come in one hour prior to Jack appt to consent for study and have blood drawn.  Informed patient I will meet him in the lobby of the cancer center at 1:30 pm on  Tuesday to review and sign consent prior to blood draw.  Thanked patient very much for his willingness to participate in this study.  Gave him my phone number and asked him to call if any problems or questions prior to our appt on Tuesday 08/09/18 at 1:30 pm.  He verbalized understanding.  Foye Spurling, BSN, RN Clinical Research Nurse 08/05/2018 4:20 PM   Met with patient and his wife in private exam room prior to lab appointment today. He was reminded that study participation is voluntary andit involves drawing 5 tubes of blood and sharing some medical and demographic information. Spent 10 minutes reviewing consent form for protocol version 2.0, IRB approved 11/17/17, with patient in it's entirety, including the purpose, risks and possible benefits of this study.  Patient denied any questions and stated he wants to enroll on the study.  Patient signed and dated the [place] embedded consent form. After patients signature, this research nurse spent 5 minutes asking patient family cancer history and his tobacco and alcoholhistory for the study. Thanked patient very much for his participation in this study. Patient was then escorted to lobby to register for his lab appointment.  Research Assistant, Farris Has, supplied research tubes and instructions to draw labs per protocol to Lab staff. She will also give patient the [employer] gift card after his blood is drawn today and a copy of his signed consent form.  Patient meets all eligibility criteria for this study and will be enrolled. Eligibility confirmed by second research nurse, Otilio Miu. Dr. Burr Medico also agrees patient meets all eligibility and none of the exclusion criteria to be enrolled  on this study. PID [id number].  Foye Spurling, BSN, RN Clinical Research Nurse 08/09/2018 2:00 PM   Research blood was drawn per protocol using Kit #[id number]. Patient tolerated well without any adverse events. Blood processed per protocol and spun  on centrifuge with protocol settings, but the SST did not separate as it should after spinning.  Study managers and monitor were notified and we were instructed to send the blood samples in and they will let us know if the samples are unusable.   Foye Spurling, BSN, RN Clinical Research Nurse 08/09/2018 4:00 PM

## 2018-08-15 ENCOUNTER — Encounter: Payer: Self-pay | Admitting: Pharmacist

## 2018-08-15 ENCOUNTER — Other Ambulatory Visit: Payer: Self-pay | Admitting: Radiology

## 2018-08-16 ENCOUNTER — Ambulatory Visit (HOSPITAL_COMMUNITY)
Admission: RE | Admit: 2018-08-16 | Discharge: 2018-08-16 | Disposition: A | Discharge status: Home / Self Care | Payer: BLUE CROSS/BLUE SHIELD | Source: Ambulatory Visit | Attending: Hematology | Admitting: Hematology

## 2018-08-16 ENCOUNTER — Other Ambulatory Visit: Payer: Self-pay | Admitting: Hematology

## 2018-08-16 ENCOUNTER — Other Ambulatory Visit: Payer: Self-pay

## 2018-08-16 ENCOUNTER — Encounter (HOSPITAL_COMMUNITY): Payer: Self-pay

## 2018-08-16 DIAGNOSIS — C20 Malignant neoplasm of rectum: Secondary | ICD-10-CM

## 2018-08-16 DIAGNOSIS — R160 Hepatomegaly, not elsewhere classified: Secondary | ICD-10-CM | POA: Diagnosis not present

## 2018-08-16 DIAGNOSIS — C19 Malignant neoplasm of rectosigmoid junction: Secondary | ICD-10-CM | POA: Diagnosis not present

## 2018-08-16 DIAGNOSIS — R590 Localized enlarged lymph nodes: Secondary | ICD-10-CM | POA: Insufficient documentation

## 2018-08-16 DIAGNOSIS — C787 Secondary malignant neoplasm of liver and intrahepatic bile duct: Secondary | ICD-10-CM | POA: Diagnosis not present

## 2018-08-16 DIAGNOSIS — R918 Other nonspecific abnormal finding of lung field: Secondary | ICD-10-CM | POA: Insufficient documentation

## 2018-08-16 DIAGNOSIS — Z801 Family history of malignant neoplasm of trachea, bronchus and lung: Secondary | ICD-10-CM | POA: Insufficient documentation

## 2018-08-16 DIAGNOSIS — N289 Disorder of kidney and ureter, unspecified: Secondary | ICD-10-CM | POA: Insufficient documentation

## 2018-08-16 DIAGNOSIS — Z803 Family history of malignant neoplasm of breast: Secondary | ICD-10-CM | POA: Insufficient documentation

## 2018-08-16 DIAGNOSIS — Z87891 Personal history of nicotine dependence: Secondary | ICD-10-CM | POA: Insufficient documentation

## 2018-08-16 DIAGNOSIS — Z452 Encounter for adjustment and management of vascular access device: Secondary | ICD-10-CM | POA: Diagnosis not present

## 2018-08-16 HISTORY — PX: IR US GUIDE BX ASP/DRAIN: IMG2392

## 2018-08-16 HISTORY — PX: IR IMAGING GUIDED PORT INSERTION: IMG5740

## 2018-08-16 LAB — COMPREHENSIVE METABOLIC PANEL
ALT: 51 U/L — ABNORMAL HIGH (ref 0–44)
AST: 30 U/L (ref 15–41)
Albumin: 3.3 g/dL — ABNORMAL LOW (ref 3.5–5.0)
Alkaline Phosphatase: 178 U/L — ABNORMAL HIGH (ref 38–126)
Anion gap: 11 (ref 5–15)
BILIRUBIN TOTAL: 0.6 mg/dL (ref 0.3–1.2)
BUN: 21 mg/dL (ref 8–23)
CO2: 19 mmol/L — ABNORMAL LOW (ref 22–32)
Calcium: 8.6 mg/dL — ABNORMAL LOW (ref 8.9–10.3)
Chloride: 110 mmol/L (ref 98–111)
Creatinine, Ser: 0.86 mg/dL (ref 0.61–1.24)
GFR calc Af Amer: >60 mL/min (ref >60)
Glucose, Bld: 114 mg/dL — ABNORMAL HIGH (ref 70–99)
POTASSIUM: 3.8 mmol/L (ref 3.5–5.1)
Sodium: 140 mmol/L (ref 135–145)
TOTAL PROTEIN: 7 g/dL (ref 6.5–8.1)

## 2018-08-16 LAB — CBC WITH DIFFERENTIAL/PLATELET
Abs Immature Granulocytes: 0.07 10*3/uL (ref 0.00–0.07)
BASOS ABS: 0.1 10*3/uL (ref 0.0–0.1)
BASOS PCT: 1 %
EOS ABS: 0.2 10*3/uL (ref 0.0–0.5)
EOS PCT: 2 %
HEMATOCRIT: 31.7 % — AB (ref 39.0–52.0)
Hemoglobin: 10 g/dL — ABNORMAL LOW (ref 13.0–17.0)
Immature Granulocytes: 1 %
LYMPHS ABS: 1.2 10*3/uL (ref 0.7–4.0)
Lymphocytes Relative: 13 %
MCH: 31 pg (ref 26.0–34.0)
MCHC: 31.5 g/dL (ref 30.0–36.0)
MCV: 98.1 fL (ref 80.0–100.0)
Monocytes Absolute: 0.7 10*3/uL (ref 0.1–1.0)
Monocytes Relative: 8 %
NRBC: 0 % (ref 0.0–0.2)
Neutro Abs: 7 10*3/uL (ref 1.7–7.7)
Neutrophils Relative %: 75 %
Platelets: 366 10*3/uL (ref 150–400)
RBC: 3.23 MIL/uL — ABNORMAL LOW (ref 4.22–5.81)
RDW: 13.2 % (ref 11.5–15.5)
WBC: 9.3 10*3/uL (ref 4.0–10.5)

## 2018-08-16 LAB — PROTIME-INR
INR: 1.09
PROTHROMBIN TIME: 14 s (ref 11.4–15.2)

## 2018-08-16 MED ORDER — CEFAZOLIN SODIUM-DEXTROSE 2-4 GM/100ML-% IV SOLN
2.0000 g | INTRAVENOUS | Status: AC
  Administered 2018-08-16: 2 g via INTRAVENOUS

## 2018-08-16 MED ORDER — LIDOCAINE-EPINEPHRINE (PF) 2 %-1:200000 IJ SOLN
INTRAMUSCULAR | Status: AC | PRN
  Administered 2018-08-16: 8 mL

## 2018-08-16 MED ORDER — SODIUM CHLORIDE 0.9 % IV SOLN
INTRAVENOUS | Status: DC
  Administered 2018-08-16: 11:00:00 via INTRAVENOUS

## 2018-08-16 MED ORDER — MIDAZOLAM HCL 2 MG/2ML IJ SOLN
INTRAMUSCULAR | Status: AC
  Filled 2018-08-16: qty 4

## 2018-08-16 MED ORDER — FENTANYL CITRATE (PF) 100 MCG/2ML IJ SOLN
INTRAMUSCULAR | Status: AC
  Filled 2018-08-16: qty 2

## 2018-08-16 MED ORDER — GELATIN ABSORBABLE 12-7 MM EX MISC
CUTANEOUS | Status: AC
  Filled 2018-08-16: qty 1

## 2018-08-16 MED ORDER — LIDOCAINE HCL 1 % IJ SOLN
INTRAMUSCULAR | Status: AC
  Filled 2018-08-16: qty 20

## 2018-08-16 MED ORDER — LIDOCAINE-EPINEPHRINE (PF) 2 %-1:200000 IJ SOLN
INTRAMUSCULAR | Status: AC
  Filled 2018-08-16: qty 20

## 2018-08-16 MED ORDER — HEPARIN SOD (PORK) LOCK FLUSH 100 UNIT/ML IV SOLN
INTRAVENOUS | Status: AC
  Filled 2018-08-16: qty 5

## 2018-08-16 MED ORDER — FENTANYL CITRATE (PF) 100 MCG/2ML IJ SOLN
INTRAMUSCULAR | Status: AC | PRN
  Administered 2018-08-16 (×2): 50 ug via INTRAVENOUS

## 2018-08-16 MED ORDER — HEPARIN SOD (PORK) LOCK FLUSH 100 UNIT/ML IV SOLN
INTRAVENOUS | Status: AC | PRN
  Administered 2018-08-16: 500 [IU] via INTRAVENOUS

## 2018-08-16 MED ORDER — MIDAZOLAM HCL 2 MG/2ML IJ SOLN
INTRAMUSCULAR | Status: AC | PRN
  Administered 2018-08-16 (×4): 1 mg via INTRAVENOUS

## 2018-08-16 MED ORDER — CEFAZOLIN SODIUM-DEXTROSE 2-4 GM/100ML-% IV SOLN
INTRAVENOUS | Status: AC
  Filled 2018-08-16: qty 100

## 2018-08-16 MED ORDER — GELATIN ABSORBABLE 12-7 MM EX MISC
CUTANEOUS | Status: AC | PRN
  Administered 2018-08-16: 1 via TOPICAL

## 2018-08-16 MED ORDER — LIDOCAINE HCL (PF) 1 % IJ SOLN
INTRAMUSCULAR | Status: AC | PRN
  Administered 2018-08-16 (×2): 10 mL via SUBCUTANEOUS

## 2018-08-16 MED ORDER — MIDAZOLAM HCL 2 MG/2ML IJ SOLN
INTRAMUSCULAR | Status: AC
  Filled 2018-08-16: qty 2

## 2018-08-16 NOTE — Sedation Documentation (Addendum)
Liver Biopsy procedure begun.

## 2018-08-16 NOTE — Procedures (Signed)
Interventional Radiology Procedure Note  Procedure: US guided biopsy of right liver mass.  Mx 18g core.    Complications: None Recommendations:  - Ok to shower tomorrow - Do not submerge for 7 days - Routine care  - 1 hour dc home  Signed,  Dulcy Fanny. Earleen Newport, DO

## 2018-08-16 NOTE — Sedation Documentation (Signed)
Port a Cath procedure completed.

## 2018-08-16 NOTE — Discharge Instructions (Signed)
Liver Biopsy, Care After These instructions give you information on caring for yourself after your procedure. Your doctor may also give you more specific instructions. Call your doctor if you have any problems or questions after your procedure. Follow these instructions at home:  Rest at home for 1-2 days or as told by your doctor.  Have someone stay with you for at least 24 hours.  Do not do these things in the first 24 hours: ? Drive. ? Use machinery. ? Take care of other people. ? Sign legal documents. ? Take a bath or shower.  There are many different ways to close and cover a cut (incision). For example, a cut can be closed with stitches, skin glue, or adhesive strips. Follow your doctor's instructions on: ? Taking care of your cut. ? Changing and removing your bandage (dressing). ? Removing whatever was used to close your cut.  Do not drink alcohol in the first week.  Do not lift more than 5 pounds or play contact sports for the first 2 weeks.  Take medicines only as told by your doctor. For 1 week, do not take medicine that has aspirin in it or medicines like ibuprofen.  Get your test results. Contact a doctor if:  A cut bleeds and leaves more than just a small spot of blood.  A cut is red, puffs up (swells), or hurts more than before.  Fluid or something else comes from a cut.  A cut smells bad.  You have a fever or chills. Get help right away if:  You have swelling, bloating, or pain in your belly (abdomen).  You get dizzy or faint.  You have a rash.  You feel sick to your stomach (nauseous) or throw up (vomit).  You have trouble breathing, feel short of breath, or feel faint.  Your chest hurts.  You have problems talking or seeing.  You have trouble balancing or moving your arms or legs. This information is not intended to replace advice given to you by your health care provider. Make sure you discuss any questions you have with your health care  provider. Document Released: 06/30/2008 Document Revised: 02/27/2016 Document Reviewed: 11/17/2013 Elsevier Interactive Patient Education  2018 [place] Insertion, Care After This sheet gives you information about how to care for yourself after your procedure. Your health care provider may also give you more specific instructions. If you have problems or questions, contact your health care provider. What can I expect after the procedure? After your procedure, it is common to have:  Discomfort at the port insertion site.  Bruising on the skin over the port. This should improve over 3-4 days.  Follow these instructions at home: [facility] care  After your port is placed, you will get a manufacturer's information card. The card has information about your port. Keep this card with you at all times.  Take care of the port as told by your health care provider. Ask your health care provider if you or a family member can get training for taking care of the port at home. A home health care nurse may also take care of the port.  Make sure to remember what type of port you have. Incision care  Follow instructions from your health care provider about how to take care of your port insertion site. Make sure you: ? Wash your hands with soap and water before you change your bandage (dressing). If soap and water are not available, use  hand sanitizer. ? Change your dressing as told by your health care provider. ? Leave stitches (sutures), skin glue, or adhesive strips in place. These skin closures may need to stay in place for 2 weeks or longer. If adhesive strip edges start to loosen and curl up, you may trim the loose edges. Do not remove adhesive strips completely unless your health care provider tells you to do that.  Check your port insertion site every day for signs of infection. Check for: ? More redness, swelling, or pain. ? More fluid or blood. ? Warmth. ? Pus or a bad  smell. General instructions  Do not take baths, swim, or use a hot tub until your health care provider approves.  Do not lift anything that is heavier than 10 lb (4.5 kg) for a week, or as told by your health care provider.  Ask your health care provider when it is okay to: ? Return to work or school. ? Resume usual physical activities or sports.  Do not drive for 24 hours if you were given a medicine to help you relax (sedative).  Take over-the-counter and prescription medicines only as told by your health care provider.  Wear a medical alert bracelet in case of an emergency. This will tell any health care providers that you have a port.  Keep all follow-up visits as told by your health care provider. This is important. Contact a health care provider if:  You cannot flush your port with saline as directed, or you cannot draw blood from the port.  You have a fever or chills.  You have more redness, swelling, or pain around your port insertion site.  You have more fluid or blood coming from your port insertion site.  Your port insertion site feels warm to the touch.  You have pus or a bad smell coming from the port insertion site. Get help right away if:  You have chest pain or shortness of breath.  You have bleeding from your port that you cannot control. Summary  Take care of the port as told by your health care provider.  Change your dressing as told by your health care provider.  Keep all follow-up visits as told by your health care provider. This information is not intended to replace advice given to you by your health care provider. Make sure you discuss any questions you have with your health care provider. Document Released: 07/12/2013 Document Revised: 08/12/2016 Document Reviewed: 08/12/2016 Elsevier Interactive Patient Education  2017 [place].   Moderate Conscious Sedation, Adult, Care After These instructions provide you with information about caring  for yourself after your procedure. Your health care provider may also give you more specific instructions. Your treatment has been planned according to current medical practices, but problems sometimes occur. Call your health care provider if you have any problems or questions after your procedure. What can I expect after the procedure? After your procedure, it is common:  To feel sleepy for several hours.  To feel clumsy and have poor balance for several hours.  To have poor judgment for several hours.  To vomit if you eat too soon.  Follow these instructions at home: For at least 24 hours after the procedure:   Do not: ? Participate in activities where you could fall or become injured. ? Drive. ? Use heavy machinery. ? Drink alcohol. ? Take sleeping pills or medicines that cause drowsiness. ? Make important decisions or sign legal documents. ? Take care of children on  your own.  Rest. Eating and drinking  Follow the diet recommended by your health care provider.  If you vomit: ? Drink water, juice, or soup when you can drink without vomiting. ? Make sure you have little or no nausea before eating solid foods. General instructions  Have a responsible adult stay with you until you are awake and alert.  Take over-the-counter and prescription medicines only as told by your health care provider.  If you smoke, do not smoke without supervision.  Keep all follow-up visits as told by your health care provider. This is important. Contact a health care provider if:  You keep feeling nauseous or you keep vomiting.  You feel light-headed.  You develop a rash.  You have a fever. Get help right away if:  You have trouble breathing. This information is not intended to replace advice given to you by your health care provider. Make sure you discuss any questions you have with your health care provider. Document Released: 07/12/2013 Document Revised: 02/24/2016 Document  Reviewed: 01/11/2016 Elsevier Interactive Patient Education  [employer].

## 2018-08-16 NOTE — Discharge Instructions (Signed)
Implanted Port Home Guide °An implanted port is a type of central line that is placed under the skin. Central lines are used to provide IV access when treatment or nutrition needs to be given through a person’s veins. Implanted ports are used for long-term IV access. An implanted port may be placed because: °· You need IV medicine that would be irritating to the small veins in your hands or arms. °· You need long-term IV medicines, such as antibiotics. °· You need IV nutrition for a long period. °· You need frequent blood draws for lab tests. °· You need dialysis. ° °Implanted ports are usually placed in the chest area, but they can also be placed in the upper arm, the abdomen, or the leg. An implanted port has two main parts: °· Reservoir. The reservoir is round and will appear as a small, raised area under your skin. The reservoir is the part where a needle is inserted to give medicines or draw blood. °· Catheter. The catheter is a thin, flexible tube that extends from the reservoir. The catheter is placed into a large vein. Medicine that is inserted into the reservoir goes into the catheter and then into the vein. ° °How will I care for my incision site? °Do not get the incision site wet. Bathe or shower as directed by your health care provider. °How is my port accessed? °Special steps must be taken to access the port: °· Before the port is accessed, a numbing cream can be placed on the skin. This helps numb the skin over the port site. °· Your health care provider uses a sterile technique to access the port. °? Your health care provider must put on a mask and sterile gloves. °? The skin over your port is cleaned carefully with an antiseptic and allowed to dry. °? The port is gently pinched between sterile gloves, and a needle is inserted into the port. °· Only "non-coring" port needles should be used to access the port. Once the port is accessed, a blood return should be checked. This helps ensure that the port  is in the vein and is not clogged. °· If your port needs to remain accessed for a constant infusion, a clear (transparent) bandage will be placed over the needle site. The bandage and needle will need to be changed every week, or as directed by your health care provider. °· Keep the bandage covering the needle clean and dry. Do not get it wet. Follow your health care provider’s instructions on how to take a shower or bath while the port is accessed. °· If your port does not need to stay accessed, no bandage is needed over the port. ° °What is flushing? °Flushing helps keep the port from getting clogged. Follow your health care provider’s instructions on how and when to flush the port. Ports are usually flushed with saline solution or a medicine called heparin. The need for flushing will depend on how the port is used. °· If the port is used for intermittent medicines or blood draws, the port will need to be flushed: °? After medicines have been given. °? After blood has been drawn. °? As part of routine maintenance. °· If a constant infusion is running, the port may not need to be flushed. ° °How long will my port stay implanted? °The port can stay in for as long as your health care provider thinks it is needed. When it is time for the port to come out, surgery will be   done to remove it. The procedure is similar to the one performed when the port was put in. When should I seek immediate medical care? When you have an implanted port, you should seek immediate medical care if:  You notice a bad smell coming from the incision site.  You have swelling, redness, or drainage at the incision site.  You have more swelling or pain at the port site or the surrounding area.  You have a fever that is not controlled with medicine.  This information is not intended to replace advice given to you by your health care provider. Make sure you discuss any questions you have with your health care provider. Document  Released: 09/21/2005 Document Revised: 02/27/2016 Document Reviewed: 05/29/2013 Elsevier Interactive Patient Education  2017 [place]. Liver Biopsy, Care After These instructions give you information on caring for yourself after your procedure. Your doctor may also give you more specific instructions. Call your doctor if you have any problems or questions after your procedure. Follow these instructions at home:  Rest at home for 1-2 days or as told by your doctor.  Have someone stay with you for at least 24 hours.  Do not do these things in the first 24 hours: ? Drive. ? Use machinery. ? Take care of other people. ? Sign legal documents. ? Take a bath or shower.  There are many different ways to close and cover a cut (incision). For example, a cut can be closed with stitches, skin glue, or adhesive strips. Follow your doctor's instructions on: ? Taking care of your cut. ? Changing and removing your bandage (dressing). ? Removing whatever was used to close your cut.  Do not drink alcohol in the first week.  Do not lift more than 5 pounds or play contact sports for the first 2 weeks.  Take medicines only as told by your doctor. For 1 week, do not take medicine that has aspirin in it or medicines like ibuprofen.  Get your test results. Contact a doctor if:  A cut bleeds and leaves more than just a small spot of blood.  A cut is red, puffs up (swells), or hurts more than before.  Fluid or something else comes from a cut.  A cut smells bad.  You have a fever or chills. Get help right away if:  You have swelling, bloating, or pain in your belly (abdomen).  You get dizzy or faint.  You have a rash.  You feel sick to your stomach (nauseous) or throw up (vomit).  You have trouble breathing, feel short of breath, or feel faint.  Your chest hurts.  You have problems talking or seeing.  You have trouble balancing or moving your arms or legs. This information is not  intended to replace advice given to you by your health care provider. Make sure you discuss any questions you have with your health care provider. Document Released: 06/30/2008 Document Revised: 02/27/2016 Document Reviewed: 11/17/2013 Elsevier Interactive Patient Education  2018 [employer]. Liver Biopsy The liver is a large organ in the upper right-hand side of your abdomen. A liver biopsy is a procedure in which a tissue sample is taken from the liver and examined under a microscope. The procedure is done to confirm a suspected problem. There are three types of liver biopsies:  Percutaneous. In this type, an incision is made in your abdomen. The sample is removed through the incision with a needle.  Laparoscopic. In this type, several incisions are made in  the abdomen. A tiny camera is passed through one of the incisions to help guide the health care provider. The sample is removed through the other incision or incisions.  Transjugular. In this type, an incision is made in the neck. A tube is passed through the incision to the liver. The sample is removed through the tube with a needle.  Tell a health care provider about:  Any allergies you have.  All medicines you are taking, including vitamins, herbs, eye drops, creams, and over-the-counter medicines.  Any problems you or family members have had with anesthetic medicines.  Any blood disorders you have.  Any surgeries you have had.  Any medical conditions you have.  Possibility of pregnancy, if this applies. What are the risks? Generally, this is a safe procedure. However, problems can occur and include:  Bleeding.  Infection.  Bruising.  Collapsed lung.  Leak of digestive juices (bile) from the liver or gallbladder.  Problems with heart rhythm.  Pain at the biopsy site or in the right shoulder.  Low blood pressure (hypotension).  Injury to nearby organs or tissues.  What happens before the procedure?  Your  health care provider may do some blood or urine tests. These will help your health care provider learn how well your kidneys and liver are working and how well your blood clots.  Ask your health care provider if you will be able to go home the day of the procedure. Arrange for someone to take you home and stay with you for at least 24 hours.  Do not eat or drink anything after midnight on the night before the procedure or as directed by your health care provider.  Ask your health care provider about: ? Changing or stopping your regular medicines. This is especially important if you are taking diabetes medicines or blood thinners. ? Taking medicines such as aspirin and ibuprofen. These medicines can thin your blood. Do not take these medicines before your procedure if your health care provider asks you not to. What happens during the procedure? Regardless of the type of biopsy that will be done, you will have an IV line placed. Through this line, you will receive fluids and medicine to relax you. If you will be having a laparoscopic biopsy, you may also receive medicine through this line to make you sleep during the procedure (general anesthetic). Percutaneous Liver Biopsy  You will positioned on your back, with your right hand over your head.  A health care provider will locate your liver by tapping and pressing on the right side of your abdomen or with the help of an ultrasound machine or CT scan.  An area at the bottom of your last right rib will be numbed.  An incision will be made in the numbed area.  The biopsy needle will be inserted into the incision.  Several samples of liver tissue will be taken with the biopsy needle. You will be asked to hold your breath as each sample is taken. Laparoscopic Liver Biopsy  You will be positioned on your back.  Several small incisions will be made in your abdomen.  Your doctor will pass a tiny camera through one incision. The camera will allow  the liver to be viewed on a TV monitor in the operating room.  Tools will be passed through the other incision or incisions. These tools will be used to remove samples of liver tissue. Transjugular Liver Biopsy  You will be positioned on your back on an X-ray table,  with your head turned to your left.  An area on your neck just over your jugular vein will be numbed.  An incision will be made in the numbed area.  A tiny tube will be inserted through the incision. It will be pushed through the jugular vein to a blood vessel in the liver called the hepatic vein.  Dye will be inserted through the tube, and X-rays will be taken. The dye will make the blood vessels in the liver light up on the X-rays.  The biopsy needle will be pushed through the tube until it reaches the liver.  Samples of liver tissue will be taken with the biopsy needle.  The needle and the tube will be removed. After the samples are obtained, the incision or incisions will be closed. What happens after the procedure?  You will be taken to a recovery area.  You may have to lie on your right side for 1-2 hours. This will prevent bleeding from the biopsy site.  Your progress will be watched. Your blood pressure, pulse, and the biopsy site will be checked often.  You may have some pain or feel sick. If this happens, tell your health care provider.  As you begin to feel better, you will be offered ice and beverages.  You may be allowed to go home when the medicines have worn off and you can walk, drink, eat, and use the bathroom. This information is not intended to replace advice given to you by your health care provider. Make sure you discuss any questions you have with your health care provider. Document Released: 12/12/2003 Document Revised: 02/24/2016 Document Reviewed: 11/17/2013 Elsevier Interactive Patient Education  2018 [place]. Moderate Conscious Sedation, Adult, Care After These instructions provide  you with information about caring for yourself after your procedure. Your health care provider may also give you more specific instructions. Your treatment has been planned according to current medical practices, but problems sometimes occur. Call your health care provider if you have any problems or questions after your procedure. What can I expect after the procedure? After your procedure, it is common:  To feel sleepy for several hours.  To feel clumsy and have poor balance for several hours.  To have poor judgment for several hours.  To vomit if you eat too soon.  Follow these instructions at home: For at least 24 hours after the procedure:   Do not: ? Participate in activities where you could fall or become injured. ? Drive. ? Use heavy machinery. ? Drink alcohol. ? Take sleeping pills or medicines that cause drowsiness. ? Make important decisions or sign legal documents. ? Take care of children on your own.  Rest. Eating and drinking  Follow the diet recommended by your health care provider.  If you vomit: ? Drink water, juice, or soup when you can drink without vomiting. ? Make sure you have little or no nausea before eating solid foods. General instructions  Have a responsible adult stay with you until you are awake and alert.  Take over-the-counter and prescription medicines only as told by your health care provider.  If you smoke, do not smoke without supervision.  Keep all follow-up visits as told by your health care provider. This is important. Contact a health care provider if:  You keep feeling nauseous or you keep vomiting.  You feel light-headed.  You develop a rash.  You have a fever. Get help right away if:  You have trouble breathing. This information is  not intended to replace advice given to you by your health care provider. Make sure you discuss any questions you have with your health care provider. Document Released: 07/12/2013 Document  Revised: 02/24/2016 Document Reviewed: 01/11/2016 Elsevier Interactive Patient Education  [employer].

## 2018-08-16 NOTE — Procedures (Signed)
Interventional Radiology Procedure Note  Procedure: Placement of a right IJ approach single lumen PowerPort.  Tip is positioned at the superior cavoatrial junction and catheter is ready for immediate use.  Complications: None Recommendations:  - Ok to shower tomorrow - Do not submerge for 7 days - Routine line care   Signed,  Newel Oien S. Rolla Servidio, DO   

## 2018-08-16 NOTE — Consult Note (Signed)
Chief Complaint: Patient was seen in consultation today for image guided liver lesion biopsy and Port-A-Cath placement  Referring Physician(s): Feng,Yan  Supervising Physician: Corrie Mckusick  Patient Status: [facility] - Out-pt  History of Present Illness: Billy Mendoza is a 67 y.o. male, ex-smoker, with history of newly diagnosed rectal carcinoma and recent imaging revealing :   1. Heterogeneous mass at the level of the rectum most compatible with primary colonic carcinoma. There is adjacent fat stranding and small lymph nodes within the pelvis. 2. Multiple lesions within the liver compatible with hepatic metastatic disease. 3. Multiple pulmonary nodules compatible with pulmonary metastatic disease. 4. Mediastinal adenopathy compatible with metastatic adenopathy. 5. There is a masslike area of subpleural consolidation within the right lower lobe which may represent metastatic disease, infectious/inflammatory process or potentially primary pulmonary malignancy. 6. There is a 2.3 cm exophytic lesion off the interpolar region the right kidney with internal density greater than that of fluid which may represent a complicated cyst.   He presents today for image guided liver lesion biopsy for further evaluation as well as Port-A-Cath placement for palliative chemotherapy.  History reviewed. No pertinent past medical history.  History reviewed. No pertinent surgical history.  Allergies: Patient has no known allergies.  Medications: Prior to Admission medications   Medication Sig Start Date End Date Taking? Authorizing Provider  acetaminophen (TYLENOL) 325 MG tablet Take 650 mg by mouth every 6 (six) hours as needed.   Yes [provider]  guaifenesin (ROBITUSSIN) 100 MG/5ML syrup Take 200 mg by mouth 3 (three) times daily as needed for cough.   Yes [provider]  ibuprofen (ADVIL,MOTRIN) 200 MG tablet Take 200 mg by mouth every 6 (six) hours as needed.   Yes  [provider]  polyethylene glycol (MIRALAX / GLYCOLAX) packet Take 17 g by mouth daily.   Yes [provider]  acetaminophen-codeine (TYLENOL #3) 300-30 MG tablet Take 1 tablet by mouth every 4 (four) hours as needed for moderate pain. 08/04/18   Truitt Merle, MD  capecitabine (XELODA) 500 MG tablet Take 4 tablets (2,000 mg total) by mouth 2 (two) times daily after a meal. Take on days 1-14 of chemotherapy. Repeat every 21 days. 08/04/18   Truitt Merle, MD  lidocaine-prilocaine (EMLA) cream Apply to affected area once 08/04/18   Truitt Merle, MD  ondansetron (ZOFRAN) 8 MG tablet Take 1 tablet (8 mg total) by mouth 2 (two) times daily as needed for refractory nausea / vomiting. Start on day 3 after chemotherapy. 08/04/18   Truitt Merle, MD  prochlorperazine (COMPAZINE) 10 MG tablet Take 1 tablet (10 mg total) by mouth every 6 (six) hours as needed (Nausea or vomiting). 08/04/18   Truitt Merle, MD     Family History  Problem Relation Age of Onset  . Cancer Mother 62       breast cancer  . Atrial fibrillation Father   . Heart failure Father   . Cancer Maternal Grandmother        lung cancer     Social History   Socioeconomic History  . Marital status: Married    Spouse name: Not on file  . Number of children: Not on file  . Years of education: Not on file  . Highest education level: Not on file  Occupational History  . Not on file  Social Needs  . Financial resource strain: Not on file  . Food insecurity:    Worry: Not on file    Inability: Not on  file  . Transportation needs:    Medical: No    Non-medical: No  Tobacco Use  . Smoking status: Former Smoker    Years: 10.00    Types: Cigarettes, Cigars  . Smokeless tobacco: Never Used  . Tobacco comment: quit in Sep 2019   Substance and Sexual Activity  . Alcohol use: Yes    Alcohol/week: 1.0 - 2.0 standard drinks    Types: 1 - 2 Shots of liquor per week  . Drug use: Never  . Sexual activity: Not on file  Lifestyle    . Physical activity:    Days per week: Not on file    Minutes per session: Not on file  . Stress: Not on file  Relationships  . Social connections:    Talks on phone: Not on file    Gets together: Not on file    Attends religious service: Not on file    Active member of club or organization: Not on file    Attends meetings of clubs or organizations: Not on file    Relationship status: Not on file  Other Topics Concern  . Not on file  Social History Narrative  . Not on file      Review of Systems he denies fever, headache, chest pain, abdominal/back pain, nausea, vomiting; he has had occasional cough and some persistent rectal bleeding  Vital Signs: Pressure 156/89, heart rate 65, temp 98.5, respirations 16, O2 sat 96% room air   Physical Exam awake, alert.  Chest clear to auscultation bilaterally.  Heart with regular rate and rhythm.  Abdomen soft, obese, positive bowel sounds, nontender. No  significant lower extremity edema.  Imaging: Ct Chest W Contrast  Result Date: 07/28/2018 CLINICAL DATA:  Patient with mass found on colonoscopy. EXAM: CT CHEST, ABDOMEN, AND PELVIS WITH CONTRAST TECHNIQUE: Multidetector CT imaging of the chest, abdomen and pelvis was performed following the standard protocol during bolus administration of intravenous contrast. Creatinine was obtained on site at [place] at [address] Results: Creatinine 0.8 mg/dL. CONTRAST:  145mL ISOVUE-300 IOPAMIDOL (ISOVUE-300) INJECTION 61% COMPARISON:  None. FINDINGS: CT CHEST FINDINGS Cardiovascular: Normal heart size. No pericardial effusion. Thoracic aortic vascular calcifications. Mediastinum/Nodes: No axillary adenopathy. There is a 2.4 cm subcarinal lymph node (image 34; series 2). There is a 2.6 cm right hilar node (image 31; series 2). Normal appearance of the esophagus. There is a 1.3 cm left paratracheal node (image 25; series 2). Lungs/Pleura: Central airways are patent. Multiple bilateral  pulmonary nodules are demonstrated. Reference subpleural 0.5 cm left lower lobe nodule (image 73; series 4). 0.3 cm nodule in the lingula (image 80; series 4). 0.8 cm right middle lobe nodule (image 88; series 4). 1.0 cm right lower lobe nodule (image 101; series 4). 0.7 cm right lower lobe nodule (image 110; series 4). 3.6 x 2.2 cm masslike area of consolidation subpleural right lower lobe (image 118; series 4). No pleural effusion or pneumothorax. Musculoskeletal: No aggressive or acute appearing osseous lesions. CT ABDOMEN PELVIS FINDINGS Hepatobiliary: Liver is normal in size and contour. Multiple low-attenuation lesions are demonstrated throughout the left and right hepatic lobes. Reference 1.8 x 1.9 cm lesion left hepatic lobe (image 55; series 2). Reference 7.2 x 5.6 cm lesion hepatic dome (image 45; series 2). Reference 1.8 x 2.1 cm lesion right hepatic lobe (image 58; series 2). Gallbladder is unremarkable. No intrahepatic or extrahepatic biliary ductal dilatation. Pancreas: Unremarkable Spleen: Unremarkable Adrenals/Urinary Tract: Normal adrenal glands. Kidneys enhance symmetrically with  contrast. Multiple bilateral renal cysts are demonstrated. Additionally there are subcentimeter too small to characterize low-attenuation renal lesions. There is a 2.3 cm exophytic lesion off the interpolar region of the right kidney (image 72; series 2) with an internal density of 17 Hounsfield units, favored represent a mildly complicated cyst. No hydronephrosis. Urinary bladder is unremarkable. Stomach/Bowel: There is a heterogeneously enhancing mass involving the rectum which measures 5.0 x 3.5 cm (image 121; series 2). There is a 1.0 cm pericolonic lymph node (image 116; series 2). There multiple smaller pericolonic nodes. Stranding and small amount of fluid about the rectum within the pelvis. No upstream bowel obstruction. Normal appendix. No evidence for small bowel obstruction. No free fluid or free intraperitoneal  air. Normal morphology of the stomach. Vascular/Lymphatic: Normal caliber abdominal aorta. Peripheral calcified atherosclerotic plaque. No retroperitoneal lymphadenopathy. Reproductive: Heterogeneous calcifications in the prostate. Other: Small bilateral fat containing inguinal hernias. Musculoskeletal: Lumbar spine degenerative changes. No aggressive or acute appearing osseous lesions. IMPRESSION: 1. Heterogeneous mass at the level of the rectum most compatible with primary colonic carcinoma. There is adjacent fat stranding and small lymph nodes within the pelvis. 2. Multiple lesions within the liver compatible with hepatic metastatic disease. 3. Multiple pulmonary nodules compatible with pulmonary metastatic disease. 4. Mediastinal adenopathy compatible with metastatic adenopathy. 5. There is a masslike area of subpleural consolidation within the right lower lobe which may represent metastatic disease, infectious/inflammatory process or potentially primary pulmonary malignancy. 6. There is a 2.3 cm exophytic lesion off the interpolar region the right kidney with internal density greater than that of fluid which may represent a complicated cyst. Consider performing follow-up exam with pre and post contrast sequences for definitive characterization. Electronically Signed   By: Lovey Newcomer M.D.   On: 07/28/2018 16:44   Ct Abdomen Pelvis W Contrast  Result Date: 07/28/2018 CLINICAL DATA:  Patient with mass found on colonoscopy. EXAM: CT CHEST, ABDOMEN, AND PELVIS WITH CONTRAST TECHNIQUE: Multidetector CT imaging of the chest, abdomen and pelvis was performed following the standard protocol during bolus administration of intravenous contrast. Creatinine was obtained on site at [place] at [address] Results: Creatinine 0.8 mg/dL. CONTRAST:  152mL ISOVUE-300 IOPAMIDOL (ISOVUE-300) INJECTION 61% COMPARISON:  None. FINDINGS: CT CHEST FINDINGS Cardiovascular: Normal heart size. No pericardial  effusion. Thoracic aortic vascular calcifications. Mediastinum/Nodes: No axillary adenopathy. There is a 2.4 cm subcarinal lymph node (image 34; series 2). There is a 2.6 cm right hilar node (image 31; series 2). Normal appearance of the esophagus. There is a 1.3 cm left paratracheal node (image 25; series 2). Lungs/Pleura: Central airways are patent. Multiple bilateral pulmonary nodules are demonstrated. Reference subpleural 0.5 cm left lower lobe nodule (image 73; series 4). 0.3 cm nodule in the lingula (image 80; series 4). 0.8 cm right middle lobe nodule (image 88; series 4). 1.0 cm right lower lobe nodule (image 101; series 4). 0.7 cm right lower lobe nodule (image 110; series 4). 3.6 x 2.2 cm masslike area of consolidation subpleural right lower lobe (image 118; series 4). No pleural effusion or pneumothorax. Musculoskeletal: No aggressive or acute appearing osseous lesions. CT ABDOMEN PELVIS FINDINGS Hepatobiliary: Liver is normal in size and contour. Multiple low-attenuation lesions are demonstrated throughout the left and right hepatic lobes. Reference 1.8 x 1.9 cm lesion left hepatic lobe (image 55; series 2). Reference 7.2 x 5.6 cm lesion hepatic dome (image 45; series 2). Reference 1.8 x 2.1 cm lesion right hepatic lobe (image 58; series 2). Gallbladder is  unremarkable. No intrahepatic or extrahepatic biliary ductal dilatation. Pancreas: Unremarkable Spleen: Unremarkable Adrenals/Urinary Tract: Normal adrenal glands. Kidneys enhance symmetrically with contrast. Multiple bilateral renal cysts are demonstrated. Additionally there are subcentimeter too small to characterize low-attenuation renal lesions. There is a 2.3 cm exophytic lesion off the interpolar region of the right kidney (image 72; series 2) with an internal density of 17 Hounsfield units, favored represent a mildly complicated cyst. No hydronephrosis. Urinary bladder is unremarkable. Stomach/Bowel: There is a heterogeneously enhancing mass  involving the rectum which measures 5.0 x 3.5 cm (image 121; series 2). There is a 1.0 cm pericolonic lymph node (image 116; series 2). There multiple smaller pericolonic nodes. Stranding and small amount of fluid about the rectum within the pelvis. No upstream bowel obstruction. Normal appendix. No evidence for small bowel obstruction. No free fluid or free intraperitoneal air. Normal morphology of the stomach. Vascular/Lymphatic: Normal caliber abdominal aorta. Peripheral calcified atherosclerotic plaque. No retroperitoneal lymphadenopathy. Reproductive: Heterogeneous calcifications in the prostate. Other: Small bilateral fat containing inguinal hernias. Musculoskeletal: Lumbar spine degenerative changes. No aggressive or acute appearing osseous lesions. IMPRESSION: 1. Heterogeneous mass at the level of the rectum most compatible with primary colonic carcinoma. There is adjacent fat stranding and small lymph nodes within the pelvis. 2. Multiple lesions within the liver compatible with hepatic metastatic disease. 3. Multiple pulmonary nodules compatible with pulmonary metastatic disease. 4. Mediastinal adenopathy compatible with metastatic adenopathy. 5. There is a masslike area of subpleural consolidation within the right lower lobe which may represent metastatic disease, infectious/inflammatory process or potentially primary pulmonary malignancy. 6. There is a 2.3 cm exophytic lesion off the interpolar region the right kidney with internal density greater than that of fluid which may represent a complicated cyst. Consider performing follow-up exam with pre and post contrast sequences for definitive characterization. Electronically Signed   By: Lovey Newcomer M.D.   On: 07/28/2018 16:44    Labs:  CBC: Recent Labs    08/16/18 1119  WBC 9.3  HGB 10.0*  HCT 31.7*  PLT 366    COAGS: Recent Labs    08/16/18 1119  INR 1.09    BMP: Recent Labs    08/16/18 1119  NA 140  K 3.8  CL 110  CO2 19*    GLUCOSE 114*  BUN 21  CALCIUM 8.6*  CREATININE 0.86  GFRNONAA >60  GFRAA >60    LIVER FUNCTION TESTS: Recent Labs    08/16/18 1119  BILITOT 0.6  AST 30  ALT 51*  ALKPHOS 178*  PROT 7.0  ALBUMIN 3.3*    TUMOR MARKERS: No results for input(s): AFPTM, CEA, CA199, CHROMGRNA in the last 8760 hours.  Assessment and Plan: 67 y.o. male, ex-smoker, with history of newly diagnosed rectal carcinoma and recent imaging revealing :   1. Heterogeneous mass at the level of the rectum most compatible with primary colonic carcinoma. There is adjacent fat stranding and small lymph nodes within the pelvis. 2. Multiple lesions within the liver compatible with hepatic metastatic disease. 3. Multiple pulmonary nodules compatible with pulmonary metastatic disease. 4. Mediastinal adenopathy compatible with metastatic adenopathy. 5. There is a masslike area of subpleural consolidation within the right lower lobe which may represent metastatic disease, infectious/inflammatory process or potentially primary pulmonary malignancy. 6. There is a 2.3 cm exophytic lesion off the interpolar region the right kidney with internal density greater than that of fluid which may represent a complicated cyst.   He presents today for image guided liver lesion biopsy for  further evaluation as well as Port-A-Cath placement for palliative chemotherapy.  Details/risks of procedures, including but not limited to, internal bleeding, infection, injury to adjacent structures discussed with patient with his understanding and consent.   Thank you for this interesting consult.  I greatly enjoyed meeting Billy Mendoza and look forward to participating in their care.  A copy of this report was sent to the requesting provider on this date.  Electronically Signed: D. Rowe Robert, PA-C 08/16/2018, 11:48 AM   I spent a total of 30 minutes    in face to face in clinical consultation, greater than 50% of which was  counseling/coordinating care for image guided liver lesion biopsy and Port-A-Cath placement

## 2018-08-17 ENCOUNTER — Telehealth: Payer: Self-pay | Admitting: Pharmacist

## 2018-08-17 DIAGNOSIS — C787 Secondary malignant neoplasm of liver and intrahepatic bile duct: Principal | ICD-10-CM

## 2018-08-17 DIAGNOSIS — Z7189 Other specified counseling: Secondary | ICD-10-CM

## 2018-08-17 DIAGNOSIS — C20 Malignant neoplasm of rectum: Secondary | ICD-10-CM

## 2018-08-17 MED ORDER — CAPECITABINE 500 MG PO TABS: 850.0000 mg/m2 | ORAL_TABLET | Freq: Two times a day (BID) | ORAL | 0 refills | Status: DC

## 2018-08-17 NOTE — Telephone Encounter (Signed)
Oral Chemotherapy Pharmacist Encounter   I spoke with patient for overview of: Xeloda (capecitabine) for the treatment of newly diagnosed, metastatic rectal cancer in conjunction with oxaliplatin and Avastin, planned duration until disease progression or unacceptable toxicity.   Counseled patient on administration, dosing, side effects, monitoring, drug-food interactions, safe handling, storage, and disposal.  Patient will take Xeloda 500mg  tablets, 4 tablets (2000mg ) by mouth in AM and 4 tabs (2000mg ) by mouth in PM, within 30 minutes of finishing meals, on days 1-14 of each 21 day cycle.   Oxaliplatin will be dosed at 130 mg/m2 IV administered every 3 weeks Avastin will be dosed at 7.5 mg/kg IV every 3 weeks  Xeloda and oxaliplatin start date: planned for 08/18/18  Adverse effects of Xeloda include but are not limited to: fatigue, decreased blood counts, GI upset, diarrhea, and hand-foot syndrome.  We also discussed possible adverse events associated with oxaliplatin and Avastin administration. We discussed IV antiemetics that will be administered prior to oxaliplatin in the infusion room. We discussed infusion room processes, port access, and processes of the cancer center.  Patient has anti-emetic on hand and knows to take it if nausea develops.   Patient will obtain anti diarrheal and alert the office of 4 or more loose stools above baseline.  Reviewed with patient importance of keeping a medication schedule and plan for any missed doses.  Billy Mendoza voiced understanding and appreciation.   All questions answered. Medication reconciliation performed and medication/allergy list updated.  Insurance authorization has been obtained. Prescription must be filled at [facility] per insurance requirement. Patient updated about dispensing pharmacy requirement. Patient provided phone number to [facility] of [phone].  Patient knows to call the office  with questions or concerns. Oral Oncology Clinic will continue to follow.  Billy Mendoza, PharmD, BCPS, BCOP  08/17/2018   3:01 PM Oral Oncology Clinic [phone]

## 2018-08-17 NOTE — Telephone Encounter (Signed)
Oral Oncology Pharmacist Encounter  Received new prescription for Xeloda (capecitabine) for the treatment of newly diagnosed, metastatic rectal cancer in conjunction with oxaliplatin and Avastin, planned duration until disease progression or unacceptable toxicity.  Patient's rectal adenocarcinoma is moderate-poorly differentiated, found to be pMMR, MSI-S  Labs from 08/16/18 assessed, OK for treatment.  Xeloda will be dosed at ~825 mg/m2 BID for 14 days on, 7 days off, repeated every 21 days. Oxaliplatin will be dosed at 130 mg/m2 IV administered every 3 weeks Avastin will be dosed at 7.5 mg/kg IV every 3 weeks  Current medication list in Epic reviewed, no significant DDIs with Xeloda identified.  Prescription has been e-scribed to the [facility] for benefits analysis and approval.  Oral Oncology Clinic will continue to follow for insurance authorization, copayment issues, initial counseling and start date.  Johny Drilling, PharmD, BCPS, BCOP  08/17/2018 8:33 AM Oral Oncology Clinic [phone]

## 2018-08-17 NOTE — Telephone Encounter (Signed)
Oral Oncology Pharmacist Encounter  I called [facility] at [phone] to follow-up that they had received Xeloda prescription. Prescription has been received.  They have marked it as urgent and know that planned start date is tomorrow, 08/18/18.  Johny Drilling, PharmD, BCPS, BCOP  08/17/2018 3:39 PM Oral Oncology Clinic [phone]

## 2018-08-18 ENCOUNTER — Inpatient Hospital Stay: Payer: BLUE CROSS/BLUE SHIELD

## 2018-08-18 ENCOUNTER — Inpatient Hospital Stay (HOSPITAL_BASED_OUTPATIENT_CLINIC_OR_DEPARTMENT_OTHER): Payer: BLUE CROSS/BLUE SHIELD | Admitting: Hematology

## 2018-08-18 ENCOUNTER — Encounter: Payer: Self-pay | Admitting: *Deleted

## 2018-08-18 ENCOUNTER — Encounter: Payer: Self-pay | Admitting: Hematology

## 2018-08-18 ENCOUNTER — Telehealth: Payer: Self-pay | Admitting: Hematology

## 2018-08-18 ENCOUNTER — Ambulatory Visit: Payer: BLUE CROSS/BLUE SHIELD | Admitting: Nutrition

## 2018-08-18 VITALS — BP 164/100 | HR 74 | Temp 98.6°F | Resp 16 | Ht 73.0 in | Wt 250.9 lb

## 2018-08-18 DIAGNOSIS — Z5111 Encounter for antineoplastic chemotherapy: Secondary | ICD-10-CM | POA: Diagnosis present

## 2018-08-18 DIAGNOSIS — K5900 Constipation, unspecified: Secondary | ICD-10-CM

## 2018-08-18 DIAGNOSIS — K625 Hemorrhage of anus and rectum: Secondary | ICD-10-CM

## 2018-08-18 DIAGNOSIS — C20 Malignant neoplasm of rectum: Secondary | ICD-10-CM

## 2018-08-18 DIAGNOSIS — C787 Secondary malignant neoplasm of liver and intrahepatic bile duct: Secondary | ICD-10-CM

## 2018-08-18 DIAGNOSIS — Z95828 Presence of other vascular implants and grafts: Secondary | ICD-10-CM

## 2018-08-18 DIAGNOSIS — Z7189 Other specified counseling: Secondary | ICD-10-CM

## 2018-08-18 DIAGNOSIS — D500 Iron deficiency anemia secondary to blood loss (chronic): Secondary | ICD-10-CM

## 2018-08-18 DIAGNOSIS — Z452 Encounter for adjustment and management of vascular access device: Secondary | ICD-10-CM | POA: Diagnosis not present

## 2018-08-18 LAB — CMP (CANCER CENTER ONLY)
ALBUMIN: 2.8 g/dL — AB (ref 3.5–5.0)
ALK PHOS: 210 U/L — AB (ref 38–126)
ALT: 35 U/L (ref 0–44)
AST: 23 U/L (ref 15–41)
Anion gap: 10 (ref 5–15)
BUN: 17 mg/dL (ref 8–23)
CALCIUM: 9.1 mg/dL (ref 8.9–10.3)
CO2: 24 mmol/L (ref 22–32)
CREATININE: 1.07 mg/dL (ref 0.61–1.24)
Chloride: 111 mmol/L (ref 98–111)
GFR, Est AFR Am: >60 mL/min (ref >60)
GFR, Estimated: >60 mL/min (ref >60)
GLUCOSE: 106 mg/dL — AB (ref 70–99)
Potassium: 3.9 mmol/L (ref 3.5–5.1)
SODIUM: 145 mmol/L (ref 135–145)
Total Bilirubin: 0.3 mg/dL (ref 0.3–1.2)
Total Protein: 7 g/dL (ref 6.5–8.1)

## 2018-08-18 LAB — CBC WITH DIFFERENTIAL (CANCER CENTER ONLY)
Abs Immature Granulocytes: 0.07 10*3/uL (ref 0.00–0.07)
BASOS ABS: 0.1 10*3/uL (ref 0.0–0.1)
BASOS PCT: 1 %
EOS ABS: 0.2 10*3/uL (ref 0.0–0.5)
EOS PCT: 3 %
HCT: 30.1 % — ABNORMAL LOW (ref 39.0–52.0)
HEMOGLOBIN: 9.6 g/dL — AB (ref 13.0–17.0)
Immature Granulocytes: 1 %
Lymphocytes Relative: 18 %
Lymphs Abs: 1.5 10*3/uL (ref 0.7–4.0)
MCH: 30.4 pg (ref 26.0–34.0)
MCHC: 31.9 g/dL (ref 30.0–36.0)
MCV: 95.3 fL (ref 80.0–100.0)
Monocytes Absolute: 0.8 10*3/uL (ref 0.1–1.0)
Monocytes Relative: 9 %
NRBC: 0 % (ref 0.0–0.2)
Neutro Abs: 6 10*3/uL (ref 1.7–7.7)
Neutrophils Relative %: 68 %
PLATELETS: 365 10*3/uL (ref 150–400)
RBC: 3.16 MIL/uL — AB (ref 4.22–5.81)
RDW: 13.1 % (ref 11.5–15.5)
WBC: 8.7 10*3/uL (ref 4.0–10.5)

## 2018-08-18 LAB — IRON AND TIBC
Iron: 32 ug/dL — ABNORMAL LOW (ref 42–163)
Saturation Ratios: 12 % — ABNORMAL LOW (ref 20–55)
TIBC: 265 ug/dL (ref 202–409)
UIBC: 233 ug/dL (ref 117–376)

## 2018-08-18 LAB — FERRITIN: Ferritin: 219 ng/mL (ref 24–336)

## 2018-08-18 LAB — CEA (IN HOUSE-CHCC): CEA (CHCC-IN HOUSE): 273.45 ng/mL — AB (ref 0.00–5.00)

## 2018-08-18 MED ORDER — HEPARIN SOD (PORK) LOCK FLUSH 100 UNIT/ML IV SOLN
500.0000 [IU] | Freq: Once | INTRAVENOUS | Status: AC | PRN
  Administered 2018-08-18: 500 [IU]
  Filled 2018-08-18: qty 5

## 2018-08-18 MED ORDER — DEXAMETHASONE SODIUM PHOSPHATE 10 MG/ML IJ SOLN
10.0000 mg | Freq: Once | INTRAMUSCULAR | Status: AC
  Administered 2018-08-18: 10 mg via INTRAVENOUS

## 2018-08-18 MED ORDER — PALONOSETRON HCL INJECTION 0.25 MG/5ML
INTRAVENOUS | Status: AC
  Filled 2018-08-18: qty 5

## 2018-08-18 MED ORDER — OXALIPLATIN CHEMO INJECTION 100 MG/20ML
300.0000 mg | Freq: Once | INTRAVENOUS | Status: AC
  Administered 2018-08-18: 300 mg via INTRAVENOUS
  Filled 2018-08-18: qty 60

## 2018-08-18 MED ORDER — DEXTROSE 5 % IV SOLN
Freq: Once | INTRAVENOUS | Status: AC
  Administered 2018-08-18: 12:00:00 via INTRAVENOUS
  Filled 2018-08-18: qty 250

## 2018-08-18 MED ORDER — SODIUM CHLORIDE 0.9% FLUSH
10.0000 mL | INTRAVENOUS | Status: DC | PRN
  Administered 2018-08-18: 10 mL
  Filled 2018-08-18: qty 10

## 2018-08-18 MED ORDER — PALONOSETRON HCL INJECTION 0.25 MG/5ML
0.2500 mg | Freq: Once | INTRAVENOUS | Status: AC
  Administered 2018-08-18: 0.25 mg via INTRAVENOUS

## 2018-08-18 MED ORDER — DEXAMETHASONE SODIUM PHOSPHATE 10 MG/ML IJ SOLN
INTRAMUSCULAR | Status: AC
  Filled 2018-08-18: qty 1

## 2018-08-18 NOTE — Progress Notes (Signed)
Notified by Beulah Gandy with the [facility] [phone] study that blood sample was unusable and they ask if patient can be redrawn.  Informed study that patient started treatment today so he is not eligible for redraw.  Foye Spurling, BSN, RN Clinical Research Nurse 08/18/2018 4:40 PM

## 2018-08-18 NOTE — Progress Notes (Signed)
67 year old male diagnosed with metastatic rectal cancer. He is receiving his first chemotherapy treatment today. He is a patient of Dr. Burr Medico.  Past medical history is not significant.  Medications include Xeloda, MiraLAX, Compazine, and Zofran.  Labs include glucose 114 and albumin 3.3 on November 12.  Height: 6 feet 1 inch. Weight: 250 pounds November 14. Usual body weight: 273 pounds. BMI: 33.1.  Patient reports that he was following a reduced calorie diet and had achieved about a 25 pound weight loss over 3 months.  He was using intermittent fasting to achieve this. He does not intend to continue following a reduced calorie diet. Patient currently denies nutrition impact symptoms.  Nutrition diagnosis:  Food and nutrition related knowledge deficit related to metastatic rectal cancer as evidenced by no prior need for nutrition related information.  Intervention: Educated patient on the importance of smaller more frequent meals and snacks utilizing high-calorie, high-protein foods. Reviewed high-protein foods and provided fact sheets. Educated patient on foods to eat after receiving oxaliplatin and provided a fact sheet. Encourage patient to strive for weight maintenance. Suggested patient could try [employer] breakfast essentials for an oral nutrition supplement if needed.  Provided coupons. Questions were answered.  Teach back method used given.  Monitoring, evaluation, goals: Patient will tolerate adequate calories and protein to minimize weight loss throughout treatment.  Next visit: To be scheduled with treatment as needed.  **Disclaimer: This note was dictated with voice recognition software. Similar sounding words can inadvertently be transcribed and this note may contain transcription errors which may not have been corrected upon publication of note.**

## 2018-08-18 NOTE — Telephone Encounter (Signed)
Scheduled apt per 11/14 los - scheduled f/u - unable to schedule appt for treatment in 3 weeks due to availability - pt request latest treatment time possible due to work schedule.  Logged in add on book - and will call pt when appt is added.

## 2018-08-18 NOTE — Patient Instructions (Addendum)
[facility] Discharge Instructions for Patients Receiving Chemotherapy  Today you received the following chemotherapy agents Oxaliplatin (Eloxatin).  To help prevent nausea and vomiting after your treatment, we encourage you to take your nausea medication as prescribed. DO NOT TAKE ZOFRAN FOR THE NEXT 3 DAYS, TAKE COMPAZINE.   If you develop nausea and vomiting that is not controlled by your nausea medication, call the clinic.   BELOW ARE SYMPTOMS THAT SHOULD BE REPORTED IMMEDIATELY:  *FEVER GREATER THAN 100.5 F  *CHILLS WITH OR WITHOUT FEVER  NAUSEA AND VOMITING THAT IS NOT CONTROLLED WITH YOUR NAUSEA MEDICATION  *UNUSUAL SHORTNESS OF BREATH  *UNUSUAL BRUISING OR BLEEDING  TENDERNESS IN MOUTH AND THROAT WITH OR WITHOUT PRESENCE OF ULCERS  *URINARY PROBLEMS  *BOWEL PROBLEMS  UNUSUAL RASH Items with * indicate a potential emergency and should be followed up as soon as possible.  Feel free to call the clinic should you have any questions or concerns. The clinic phone number is [phone].  Please show the [place] at check-in to the Emergency Department and triage nurse.  Oxaliplatin Injection What is this medicine? OXALIPLATIN (ox AL i PLA tin) is a chemotherapy drug. It targets fast dividing cells, like cancer cells, and causes these cells to die. This medicine is used to treat cancers of the colon and rectum, and many other cancers. This medicine may be used for other purposes; ask your health care provider or pharmacist if you have questions. COMMON BRAND NAME(S): Eloxatin What should I tell my health care provider before I take this medicine? They need to know if you have any of these conditions: -kidney disease -an unusual or allergic reaction to oxaliplatin, other chemotherapy, other medicines, foods, dyes, or preservatives -pregnant or trying to get pregnant -breast-feeding How should I use this medicine? This drug is given as an infusion  into a vein. It is administered in a hospital or clinic by a specially trained health care professional. Talk to your pediatrician regarding the use of this medicine in children. Special care may be needed. Overdosage: If you think you have taken too much of this medicine contact a poison control center or emergency room at once. NOTE: This medicine is only for you. Do not share this medicine with others. What if I miss a dose? It is important not to miss a dose. Call your doctor or health care professional if you are unable to keep an appointment. What may interact with this medicine? -medicines to increase blood counts like filgrastim, pegfilgrastim, sargramostim -probenecid -some antibiotics like amikacin, gentamicin, neomycin, polymyxin B, streptomycin, tobramycin -zalcitabine Talk to your doctor or health care professional before taking any of these medicines: -acetaminophen -aspirin -ibuprofen -ketoprofen -naproxen This list may not describe all possible interactions. Give your health care provider a list of all the medicines, herbs, non-prescription drugs, or dietary supplements you use. Also tell them if you smoke, drink alcohol, or use illegal drugs. Some items may interact with your medicine. What should I watch for while using this medicine? Your condition will be monitored carefully while you are receiving this medicine. You will need important blood work done while you are taking this medicine. This medicine can make you more sensitive to cold. Do not drink cold drinks or use ice. Cover exposed skin before coming in contact with cold temperatures or cold objects. When out in cold weather wear warm clothing and cover your mouth and nose to warm the air that goes into your lungs. Tell  your doctor if you get sensitive to the cold. This drug may make you feel generally unwell. This is not uncommon, as chemotherapy can affect healthy cells as well as cancer cells. Report any side  effects. Continue your course of treatment even though you feel ill unless your doctor tells you to stop. In some cases, you may be given additional medicines to help with side effects. Follow all directions for their use. Call your doctor or health care professional for advice if you get a fever, chills or sore throat, or other symptoms of a cold or flu. Do not treat yourself. This drug decreases your body's ability to fight infections. Try to avoid being around people who are sick. This medicine may increase your risk to bruise or bleed. Call your doctor or health care professional if you notice any unusual bleeding. Be careful brushing and flossing your teeth or using a toothpick because you may get an infection or bleed more easily. If you have any dental work done, tell your dentist you are receiving this medicine. Avoid taking products that contain aspirin, acetaminophen, ibuprofen, naproxen, or ketoprofen unless instructed by your doctor. These medicines may hide a fever. Do not become pregnant while taking this medicine. Women should inform their doctor if they wish to become pregnant or think they might be pregnant. There is a potential for serious side effects to an unborn child. Talk to your health care professional or pharmacist for more information. Do not breast-feed an infant while taking this medicine. Call your doctor or health care professional if you get diarrhea. Do not treat yourself. What side effects may I notice from receiving this medicine? Side effects that you should report to your doctor or health care professional as soon as possible: -allergic reactions like skin rash, itching or hives, swelling of the face, lips, or tongue -low blood counts - This drug may decrease the number of white blood cells, red blood cells and platelets. You may be at increased risk for infections and bleeding. -signs of infection - fever or chills, cough, sore throat, pain or difficulty passing  urine -signs of decreased platelets or bleeding - bruising, pinpoint red spots on the skin, black, tarry stools, nosebleeds -signs of decreased red blood cells - unusually weak or tired, fainting spells, lightheadedness -breathing problems -chest pain, pressure -cough -diarrhea -jaw tightness -mouth sores -nausea and vomiting -pain, swelling, redness or irritation at the injection site -pain, tingling, numbness in the hands or feet -problems with balance, talking, walking -redness, blistering, peeling or loosening of the skin, including inside the mouth -trouble passing urine or change in the amount of urine Side effects that usually do not require medical attention (report to your doctor or health care professional if they continue or are bothersome): -changes in vision -constipation -hair loss -loss of appetite -metallic taste in the mouth or changes in taste -stomach pain This list may not describe all possible side effects. Call your doctor for medical advice about side effects. You may report side effects to FDA at 1-800-FDA-1088. Where should I keep my medicine? This drug is given in a hospital or clinic and will not be stored at home. NOTE: This sheet is a summary. It may not cover all possible information. If you have questions about this medicine, talk to your doctor, pharmacist, or health care provider.  2018 [employer] (2008-04-17 17:22:47)

## 2018-08-18 NOTE — Progress Notes (Signed)
[place]  [phone] [phone]  Clinic Follow Up Note   Patient Care Team: Merry Proud, MD as PCP - General (Internal Medicine) Truitt Merle, MD as Consulting Physician (Hematology) Carol Ada, MD as Consulting Physician (Gastroenterology)   Date of Service:  08/18/2018   CHIEF COMPLAIN: f/u metastatic colon cancer    Oncology History   Cancer Staging Rectal cancer metastasized to liver [facility]) Staging form: Colon and Rectum, AJCC 8th Edition - Clinical stage from 07/28/2018: Stage IVB (cTX, cNX, pM1b) - Signed by Truitt Merle, MD on 08/18/2018       Rectal cancer metastasized to liver ([place])   07/28/2018 Initial Biopsy    Biopsy 07/28/18 Final Microscopic Diagnosis A.Colon, Ascending, Polyp:  -Fragments of tubular adenoma -No high-grade dysplasia of malignancy.  B. Rectum, Mass, Polyp:  -Invasive adenocarcinoma, moderately - poorly differentiatred.     08/04/2018 Initial Diagnosis    Rectal cancer metastasized to liver [facility])    08/16/2018 Pathology Results    Biospy  Diagnosis 08/16/18 Liver, needle/core biopsy - ADENOCARCINOMA. Microscopic Comment By morphology, the findings are compatible with the provided clinical history of colorectal adenocarcinoma. Results reported to [name] on 08/18/2018. Intradepartmental consultation was obtained ([name]).     08/18/2018 -  Chemotherapy    CAPOX q3weeks with Xeloda '2000mg'$  BID 2 weeks on, 1 weeks off starting 08/18/18. May add Avastin with cycle 2. .     08/18/2018 Procedure    Colonoscopy by Dr. Benson Norway showed malignant appearing partially obstructing tumor in the rectum, 8 cm above anal verge.    08/18/2018 -  Chemotherapy    CAPOX q3weeks with Xeloda '2000mg'$  BID 2 weeks on, 1 weeks off starting 08/18/18. May add Avastin with cycle 2     HISTORY OF PRESENTING ILLNESS:  Billy Mendoza 67 y.o. male is a here because of newly diagnosed rectal cancer. The patient was  referred by Dr. Benson Norway. The patient presents to the clinic today accompanied by his wife Billy Mendoza, who is a Marine scientist.  He presented with rectal bleeding for one year, initially was on and off, overall mild, he thought was hemorrhoids.  It has been getting worse lately in the past 2 months, he has constipation also. He has also developed some rectal pain, constant, 4/10 on pain scale, on tylenol 2-4 times a day, worse at night, which wakes him up, but he is able to fall back sleep. No nausea, abdominal pain or bloating.  He also reports frequent night sweats in the past week, no fever or chills.  He had a dry cough last week, mild, improved now, he took some Mucinex last week. He had intentional weight loss for 25 lbs over 3 months.  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   Due to the worsening rectal bleeding, he was referred to gastroenterologist Dr. Almyra Free, underwent colonoscopy on July 28, 2018.  It showed a malignant partially obstructing tumor in the rectum, 8 cm from anal verge, biopsied.  Labs showed invasive adenocarcinoma.  He underwent staging CT chest, abdomen and pelvis with contrast, which unfortunately showed multiple small lung nodules, and several liver lesions, highly suspicious for metastasis.  He has no significant past medical history.  He is married, lives with his wife.  He has 2 adopted daughters, who lives in [place].  They come in to visit him in a few weeks.  CURRENT THERAPY: CAPOX q3weeks with Xeloda '2000mg'$  BID 2 weeks on, 1 weeks off starting 08/18/18. May add Avastin with cycle 2.   INTERVAL HISTORY  Billy Mendoza is here for a follow up and cycle 1 of CAPOX. He presents to the clinic today accompanied by his wife. He notes his liver biopsy this week went well for him. He noes he feels good. He notes he is eating adequately but more tired. He has always  taken a nap at baseline. He has been taking OTC Tylenol as needed every few hours to control his rectal pain. He has not tried the Tylenol #3 yet. Without OTC pain meds his pain is 4-5/10. He has reduced Miralax and Stool softeners as he assumes Xeloda can cause diarrhea.  He was able to pick up his antiemetics and wonders which one he should take first. He plans to go to work the day after infusion if he can tolerate it. He thinks he is stressed and anxious about his first cycle but he feels he is fine with proceeding with treatment. He notes his BP increased being in clinic today when it is usually normal.     MEDICAL HISTORY:  History reviewed. No pertinent past medical history.  SURGICAL HISTORY: Past Surgical History:  Procedure Laterality Date  . IR IMAGING GUIDED PORT INSERTION  08/16/2018  . IR US GUIDE BX ASP/DRAIN  08/16/2018    SOCIAL HISTORY: Social History   Socioeconomic History  . Marital status: Married    Spouse name: Not on file  . Number of children: Not on file  . Years of education: Not on file  . Highest education level: Not on file  Occupational History  . Not on file  Social Needs  . Financial resource strain: Not on file  . Food insecurity:    Worry: Not on file    Inability: Not on file  . Transportation needs:    Medical: No    Non-medical: No  Tobacco Use  . Smoking status: Former Smoker    Years: 10.00    Types: Cigarettes, Cigars  . Smokeless tobacco: Never Used  . Tobacco comment: quit in Sep 2019   Substance and Sexual Activity  . Alcohol use: Yes    Alcohol/week: 1.0 - 2.0 standard drinks    Types: 1 - 2 Shots of liquor per week  . Drug use: Never  . Sexual activity: Not on file  Lifestyle  . Physical activity:    Days per week: Not on file    Minutes per session: Not on file  . Stress: Not on file  Relationships  . Social connections:    Talks on phone: Not on file    Gets together: Not on file    Attends religious service: Not  on file    Active member of club or organization: Not on file    Attends meetings of clubs or organizations: Not on file    Relationship status: Not on file  . Intimate partner violence:    Fear of current or ex partner: Not on file    Emotionally abused: Not on file    Physically abused: Not on file    Forced sexual activity: Not on file  Other Topics Concern  . Not on file  Social History Narrative  . Not on file    FAMILY HISTORY: Family History  Problem Relation Age of Onset  . Cancer Mother 14       breast cancer  . Atrial  fibrillation Father   . Heart failure Father   . Cancer Maternal Grandmother        lung cancer     ALLERGIES:  has No Known Allergies.  MEDICATIONS:  Current Outpatient Medications  Medication Sig Dispense Refill  . acetaminophen (TYLENOL) 325 MG tablet Take 650 mg by mouth every 6 (six) hours as needed.    Marland Kitchen acetaminophen-codeine (TYLENOL #3) 300-30 MG tablet Take 1 tablet by mouth every 4 (four) hours as needed for moderate pain. 20 tablet 0  . capecitabine (XELODA) 500 MG tablet Take 4 tablets (2,000 mg total) by mouth 2 (two) times daily after a meal. Take on days 1-14 of chemotherapy. Repeat every 21 days. 112 tablet 0  . guaifenesin (ROBITUSSIN) 100 MG/5ML syrup Take 200 mg by mouth 3 (three) times daily as needed for cough.    Marland Kitchen ibuprofen (ADVIL,MOTRIN) 200 MG tablet Take 200 mg by mouth every 6 (six) hours as needed.    . lidocaine-prilocaine (EMLA) cream Apply to affected area once 30 g 3  . ondansetron (ZOFRAN) 8 MG tablet Take 1 tablet (8 mg total) by mouth 2 (two) times daily as needed for refractory nausea / vomiting. Start on day 3 after chemotherapy. 30 tablet 1  . polyethylene glycol (MIRALAX / GLYCOLAX) packet Take 17 g by mouth daily.    . prochlorperazine (COMPAZINE) 10 MG tablet Take 1 tablet (10 mg total) by mouth every 6 (six) hours as needed (Nausea or vomiting). 30 tablet 1   No current facility-administered medications for  this visit.     REVIEW OF SYSTEMS:   Constitutional: Denies fevers, chills or abnormal night sweats (+) increased tiredness at night   Eyes: Denies blurriness of vision, double vision or watery eyes Ears, nose, mouth, throat, and face: Denies mucositis or sore throat Respiratory: Denies dyspnea or wheezes, mild dry cough last week  Cardiovascular: Denies palpitation, chest discomfort or lower extremity swelling Gastrointestinal:  See HPI  Skin: Denies abnormal skin rashes Lymphatics: Denies new lymphadenopathy or easy bruising Neurological:Denies numbness, tingling or new weaknesses Behavioral/Psych: Mood is stable, no new changes  All other systems were reviewed with the patient and are negative.  PHYSICAL EXAMINATION: ECOG PERFORMANCE STATUS: 1 - Symptomatic but completely ambulatory  Vitals:   08/18/18 1037 08/18/18 1039  BP: (!) 163/106 (!) 164/100  Pulse: 74   Resp: 16   Temp: 98.6 F (37 C)   SpO2: 99%    Filed Weights   08/18/18 1037  Weight: 250 lb 14.4 oz (113.8 kg)     GENERAL:alert, no distress and comfortable SKIN: skin color, texture, turgor are normal, no rashes or significant lesions EYES: normal, conjunctiva are pink and non-injected, sclera clear OROPHARYNX:no exudate, no erythema and lips, buccal mucosa, and tongue normal  NECK: supple, thyroid normal size, non-tender, without nodularity LYMPH:  no palpable lymphadenopathy in the cervical, axillary or inguinal LUNGS: clear to auscultation and percussion with normal breathing effort HEART: regular rate & rhythm and no murmurs and no lower extremity edema ABDOMEN:abdomen soft, non-tender and normal bowel sounds Musculoskeletal:no cyanosis of digits and no clubbing  PSYCH: alert & oriented x 3 with fluent speech NEURO: no focal motor/sensory deficits  LABORATORY DATA:   CBC Latest Ref Rng & Units 08/18/2018 08/16/2018  WBC 4.0 - 10.5 K/uL 8.7 9.3  Hemoglobin 13.0 - 17.0 g/dL 9.6(L) 10.0(L)  Hematocrit  39.0 - 52.0 % 30.1(L) 31.7(L)  Platelets 150 - 400 K/uL 365 366   CMP Latest Ref Rng &  Units 08/18/2018 08/16/2018  Glucose 70 - 99 mg/dL 106(H) 114(H)  BUN 8 - 23 mg/dL 17 21  Creatinine 0.61 - 1.24 mg/dL 1.07 0.86  Sodium 135 - 145 mmol/L 145 140  Potassium 3.5 - 5.1 mmol/L 3.9 3.8  Chloride 98 - 111 mmol/L 111 110  CO2 22 - 32 mmol/L 24 19(L)  Calcium 8.9 - 10.3 mg/dL 9.1 8.6(L)  Total Protein 6.5 - 8.1 g/dL 7.0 7.0  Total Bilirubin 0.3 - 1.2 mg/dL 0.3 0.6  Alkaline Phos 38 - 126 U/L 210(H) 178(H)  AST 15 - 41 U/L 23 30  ALT 0 - 44 U/L 35 51(H)   I have reviewed the data as listed Outside lab on July 28, 2018  CEA 108 WBC 8.5, hemoglobin 12.7, hematocrit 36.6, MCV 91, platelet 404  PATHOLOGY  Biopsy  Diagnosis 08/16/18 Liver, needle/core biopsy - ADENOCARCINOMA. Microscopic Comment By morphology, the findings are compatible with the provided clinical history of colorectal adenocarcinoma. Results reported to [name] on 08/18/2018. Intradepartmental consultation was obtained ([name]).   Biopsy 07/28/18 Final Microscopic Diagnosis A.Colon, Ascending, Polyp:  -Fragments of tubular adenoma -No high-grade dysplasia of malignancy.  B. Rectum, Mass, Polyp:  -Invasive adenocarcinoma, moderately - poorly differentiatred.        PROCEDURES  Colonoscopy by Dr. Benson Norway 07/28/18 IMPRESSION:  -one 79m polyp in the ascending colon, removed with a cold snare. Resected and retrieved. -Malignant partially obstructing tumur in teh rectum. Biopsied -Diverticulosis in teh sigmoid colon.     RADIOGRAPHIC STUDIES: I have personally reviewed the radiological images as listed and agreed with the findings in the report. Ct Chest W Contrast  Result Date: 07/28/2018 CLINICAL DATA:  Patient with mass found on colonoscopy. EXAM: CT CHEST, ABDOMEN, AND PELVIS WITH CONTRAST TECHNIQUE: Multidetector CT imaging of the chest, abdomen and pelvis was performed following the  standard protocol during bolus administration of intravenous contrast. [place] [address] Results: Creatinine 0.8 mg/dL. CONTRAST:  1245mISOVUE-300 IOPAMIDOL (ISOVUE-300) INJECTION 61% COMPARISON:  None. FINDINGS: CT CHEST FINDINGS Cardiovascular: Normal heart size. No pericardial effusion. Thoracic aortic vascular calcifications. Mediastinum/Nodes: No axillary adenopathy. There is a 2.4 cm subcarinal lymph node (image 34; series 2). There is a 2.6 cm right hilar node (image 31; series 2). Normal appearance of the esophagus. There is a 1.3 cm left paratracheal node (image 25; series 2). Lungs/Pleura: Central airways are patent. Multiple bilateral pulmonary nodules are demonstrated. Reference subpleural 0.5 cm left lower lobe nodule (image 73; series 4). 0.3 cm nodule in the lingula (image 80; series 4). 0.8 cm right middle lobe nodule (image 88; series 4). 1.0 cm right lower lobe nodule (image 101; series 4). 0.7 cm right lower lobe nodule (image 110; series 4). 3.6 x 2.2 cm masslike area of consolidation subpleural right lower lobe (image 118; series 4). No pleural effusion or pneumothorax. Musculoskeletal: No aggressive or acute appearing osseous lesions. CT ABDOMEN PELVIS FINDINGS Hepatobiliary: Liver is normal in size and contour. Multiple low-attenuation lesions are demonstrated throughout the left and right hepatic lobes. Reference 1.8 x 1.9 cm lesion left hepatic lobe (image 55; series 2). Reference 7.2 x 5.6 cm lesion hepatic dome (image 45; series 2). Reference 1.8 x 2.1 cm lesion right hepatic lobe (image 58; series 2). Gallbladder is unremarkable. No intrahepatic or extrahepatic biliary ductal dilatation. Pancreas: Unremarkable Spleen: Unremarkable Adrenals/Urinary Tract: Normal adrenal glands. Kidneys enhance symmetrically with contrast. Multiple bilateral renal cysts are demonstrated. Additionally  there are subcentimeter too small to  characterize low-attenuation renal lesions. There is a 2.3 cm exophytic lesion off the interpolar region of the right kidney (image 72; series 2) with an internal density of 17 Hounsfield units, favored represent a mildly complicated cyst. No hydronephrosis. Urinary bladder is unremarkable. Stomach/Bowel: There is a heterogeneously enhancing mass involving the rectum which measures 5.0 x 3.5 cm (image 121; series 2). There is a 1.0 cm pericolonic lymph node (image 116; series 2). There multiple smaller pericolonic nodes. Stranding and small amount of fluid about the rectum within the pelvis. No upstream bowel obstruction. Normal appendix. No evidence for small bowel obstruction. No free fluid or free intraperitoneal air. Normal morphology of the stomach. Vascular/Lymphatic: Normal caliber abdominal aorta. Peripheral calcified atherosclerotic plaque. No retroperitoneal lymphadenopathy. Reproductive: Heterogeneous calcifications in the prostate. Other: Small bilateral fat containing inguinal hernias. Musculoskeletal: Lumbar spine degenerative changes. No aggressive or acute appearing osseous lesions. IMPRESSION: 1. Heterogeneous mass at the level of the rectum most compatible with primary colonic carcinoma. There is adjacent fat stranding and small lymph nodes within the pelvis. 2. Multiple lesions within the liver compatible with hepatic metastatic disease. 3. Multiple pulmonary nodules compatible with pulmonary metastatic disease. 4. Mediastinal adenopathy compatible with metastatic adenopathy. 5. There is a masslike area of subpleural consolidation within the right lower lobe which may represent metastatic disease, infectious/inflammatory process or potentially primary pulmonary malignancy. 6. There is a 2.3 cm exophytic lesion off the interpolar region the right kidney with internal density greater than that of fluid which may represent a complicated cyst. Consider performing follow-up exam with pre and post  contrast sequences for definitive characterization. Electronically Signed   By: Lovey Newcomer M.D.   On: 07/28/2018 16:44   Ct Abdomen Pelvis W Contrast  Result Date: 07/28/2018 CLINICAL DATA:  Patient with mass found on colonoscopy. EXAM: CT CHEST, ABDOMEN, AND PELVIS WITH CONTRAST TECHNIQUE: Multidetector CT imaging of the chest, abdomen and pelvis was performed following the standard protocol during bolus administration of intravenous contrast. Creatinine was obtained on site at [place] at [address] Results: Creatinine 0.8 mg/dL. CONTRAST:  147m ISOVUE-300 IOPAMIDOL (ISOVUE-300) INJECTION 61% COMPARISON:  None. FINDINGS: CT CHEST FINDINGS Cardiovascular: Normal heart size. No pericardial effusion. Thoracic aortic vascular calcifications. Mediastinum/Nodes: No axillary adenopathy. There is a 2.4 cm subcarinal lymph node (image 34; series 2). There is a 2.6 cm right hilar node (image 31; series 2). Normal appearance of the esophagus. There is a 1.3 cm left paratracheal node (image 25; series 2). Lungs/Pleura: Central airways are patent. Multiple bilateral pulmonary nodules are demonstrated. Reference subpleural 0.5 cm left lower lobe nodule (image 73; series 4). 0.3 cm nodule in the lingula (image 80; series 4). 0.8 cm right middle lobe nodule (image 88; series 4). 1.0 cm right lower lobe nodule (image 101; series 4). 0.7 cm right lower lobe nodule (image 110; series 4). 3.6 x 2.2 cm masslike area of consolidation subpleural right lower lobe (image 118; series 4). No pleural effusion or pneumothorax. Musculoskeletal: No aggressive or acute appearing osseous lesions. CT ABDOMEN PELVIS FINDINGS Hepatobiliary: Liver is normal in size and contour. Multiple low-attenuation lesions are demonstrated throughout the left and right hepatic lobes. Reference 1.8 x 1.9 cm lesion left hepatic lobe (image 55; series 2). Reference 7.2 x 5.6 cm lesion hepatic dome (image 45; series 2). Reference 1.8 x 2.1  cm lesion right hepatic lobe (image 58; series 2). Gallbladder is unremarkable. No intrahepatic or extrahepatic biliary ductal  dilatation. Pancreas: Unremarkable Spleen: Unremarkable Adrenals/Urinary Tract: Normal adrenal glands. Kidneys enhance symmetrically with contrast. Multiple bilateral renal cysts are demonstrated. Additionally there are subcentimeter too small to characterize low-attenuation renal lesions. There is a 2.3 cm exophytic lesion off the interpolar region of the right kidney (image 72; series 2) with an internal density of 17 Hounsfield units, favored represent a mildly complicated cyst. No hydronephrosis. Urinary bladder is unremarkable. Stomach/Bowel: There is a heterogeneously enhancing mass involving the rectum which measures 5.0 x 3.5 cm (image 121; series 2). There is a 1.0 cm pericolonic lymph node (image 116; series 2). There multiple smaller pericolonic nodes. Stranding and small amount of fluid about the rectum within the pelvis. No upstream bowel obstruction. Normal appendix. No evidence for small bowel obstruction. No free fluid or free intraperitoneal air. Normal morphology of the stomach. Vascular/Lymphatic: Normal caliber abdominal aorta. Peripheral calcified atherosclerotic plaque. No retroperitoneal lymphadenopathy. Reproductive: Heterogeneous calcifications in the prostate. Other: Small bilateral fat containing inguinal hernias. Musculoskeletal: Lumbar spine degenerative changes. No aggressive or acute appearing osseous lesions. IMPRESSION: 1. Heterogeneous mass at the level of the rectum most compatible with primary colonic carcinoma. There is adjacent fat stranding and small lymph nodes within the pelvis. 2. Multiple lesions within the liver compatible with hepatic metastatic disease. 3. Multiple pulmonary nodules compatible with pulmonary metastatic disease. 4. Mediastinal adenopathy compatible with metastatic adenopathy. 5. There is a masslike area of subpleural consolidation  within the right lower lobe which may represent metastatic disease, infectious/inflammatory process or potentially primary pulmonary malignancy. 6. There is a 2.3 cm exophytic lesion off the interpolar region the right kidney with internal density greater than that of fluid which may represent a complicated cyst. Consider performing follow-up exam with pre and post contrast sequences for definitive characterization. Electronically Signed   By: Lovey Newcomer M.D.   On: 07/28/2018 16:44   Ir US Guide Bx Asp/drain  Result Date: 08/16/2018 INDICATION: 67 year old male with a history of colorectal carcinoma, likely metastatic to liver EXAM: IR ULTRASOUND GUIDANCE MEDICATIONS: None. ANESTHESIA/SEDATION: Moderate (conscious) sedation was employed during this procedure. A total of Versed 2.0 mg and Fentanyl 50 mcg was administered intravenously. Moderate Sedation Time: 10 minutes. The patient's level of consciousness and vital signs were monitored continuously by radiology nursing throughout the procedure under my direct supervision. FLUOROSCOPY TIME:  None COMPLICATIONS: None PROCEDURE: Informed written consent was obtained from the patient after a thorough discussion of the procedural risks, benefits and alternatives. All questions were addressed. Maximal Sterile Barrier Technique was utilized including caps, mask, sterile gowns, sterile gloves, sterile drape, hand hygiene and skin antiseptic. A timeout was performed prior to the initiation of the procedure. Patient positioned supine position on the ultrasound table. Images of the upper abdomen were performed with images stored sent to PACs. The patient is then prepped and draped in the usual sterile fashion. 1% lidocaine was used for local anesthesia. Using ultrasound guidance, 17 gauge guide needle was advanced into the right liver, targeting the selected mass target. Once the needle tip was confirmed, multiple 18 gauge core biopsy were performed. Gel-Foam was  infused upon withdrawal. Final image was stored. Patient tolerated the procedure well and remained hemodynamically stable throughout. No complications were encountered and no significant blood loss. IMPRESSION: Status post ultrasound-guided biopsy of right liver lesion. Tissue specimen sent to pathology for complete histopathologic analysis. Signed, Dulcy Fanny. Dellia Nims, [facility] Radiology Electronically Signed   By: Corrie Mckusick D.O.  On: 08/16/2018 14:06   Ir Imaging Guided Port Insertion  Result Date: 08/16/2018 INDICATION: 67 year old male with a history metastatic colorectal carcinoma EXAM: IMPLANTED PORT A CATH PLACEMENT WITH ULTRASOUND AND FLUOROSCOPIC GUIDANCE MEDICATIONS: 2.0 g Ancef; The antibiotic was administered within an appropriate time interval prior to skin puncture. ANESTHESIA/SEDATION: Moderate (conscious) sedation was employed during this procedure. A total of Versed 2.0 mg and Fentanyl 50 mcg was administered intravenously. Moderate Sedation Time: 19 minutes. The patient's level of consciousness and vital signs were monitored continuously by radiology nursing throughout the procedure under my direct supervision. FLUOROSCOPY TIME:  0 minutes, 6 seconds (2 mGy) COMPLICATIONS: None PROCEDURE: The procedure, risks, benefits, and alternatives were explained to the patient. Questions regarding the procedure were encouraged and answered. The patient understands and consents to the procedure. Ultrasound survey was performed with images stored and sent to PACs. The right neck and chest was prepped with chlorhexidine, and draped in the usual sterile fashion using maximum barrier technique (cap and mask, sterile gown, sterile gloves, large sterile sheet, hand hygiene and cutaneous antiseptic). Antibiotic prophylaxis was provided with 2.0g Ancef administered IV one hour prior to skin incision. Local anesthesia was attained by infiltration with  1% lidocaine without epinephrine. Ultrasound demonstrated patency of the right internal jugular vein, and this was documented with an image. Under real-time ultrasound guidance, this vein was accessed with a 21 gauge micropuncture needle and image documentation was performed. A small dermatotomy was made at the access site with an 11 scalpel. A 0.018" wire was advanced into the SVC and used to estimate the length of the internal catheter. The access needle exchanged for a 57F micropuncture vascular sheath. The 0.018" wire was then removed and a 0.035" wire advanced into the IVC. An appropriate location for the subcutaneous reservoir was selected below the clavicle and an incision was made through the skin and underlying soft tissues. The subcutaneous tissues were then dissected using a combination of blunt and sharp surgical technique and a pocket was formed. A single lumen power injectable portacatheter was then tunneled through the subcutaneous tissues from the pocket to the dermatotomy and the port reservoir placed within the subcutaneous pocket. The venous access site was then serially dilated and a peel away vascular sheath placed over the wire. The wire was removed and the port catheter advanced into position under fluoroscopic guidance. The catheter tip is positioned in the cavoatrial junction. This was documented with a spot image. The portacatheter was then tested and found to flush and aspirate well. The port was flushed with saline followed by 100 units/mL heparinized saline. The pocket was then closed in two layers using first subdermal inverted interrupted absorbable sutures followed by a running subcuticular suture. The epidermis was then sealed with Dermabond. The dermatotomy at the venous access site was also seal with Dermabond. Patient tolerated the procedure well and remained hemodynamically stable throughout. No complications encountered and no significant blood loss encountered IMPRESSION: Status  post right IJ port catheter placement. Catheter ready for use. Signed, Dulcy Fanny. Dellia Nims, RPVI [facility] Radiology Electronically Signed   By: Corrie Mckusick D.O.   On: 08/16/2018 14:07    CT CAP W Contrast 07/28/18  IMPRESSION: 1. Heterogeneous mass at the level of the rectum most compatible with primary colonic carcinoma. There is adjacent fat stranding and small lymph nodes within the pelvis.  2. Multiple lesions within the liver compatible with hepatic metastatic disease 3. Multiple pulmonary nodules compatible with pulmonary  metastatic disease 4. Mediastinal adenopathy compatible with metastatic adenopathy.  5. There is a masslike area of subpleural consolidation within the right lower lobe which may represent metastatic disease, infectious.inflammatory process or potentially primary pulmonary malignancy.  6. There is a 2.3 cm exophytic lesion off the interpolar region of the right kidney with internal density greater than that of fluid which may represent a complicated cyst. Consider performing follow-up exam with pre and post contrast sequences for definitive characteristics.      ASSESSMENT & PLAN:  Cameran Pettey is a 67 y.o. male significant past medical history, presented with rectal bleeding for 1 year, and recent rectal pain and weight loss.    1.  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 -Unfortunately CT scan is highly suspicious for bilateral lung, node and multiple liver metastasis. -The proximal rectal mass is partially obstructing, he does not have significant symptoms of bowel obstruction, rectal bleeding is overall mild. No need for urgent surgery  -We discussed his 08/16/18 Liver biopsy which confirmed metastasis from rectal cancer, I  recommend him to start with systemic chemotherapy, with a goal of disease control, and to prolong his life. If he responds well to the chemotherapy, his rectal symptoms will likely also improve. -he is starting first-line chemotherapy CAPOX today, chemo consent was previously obtained.  He participated the chemo class, I reviewed the potential side effects and management with him and his wife again. -I have requested his tumor to be tested for MMR, and foundation one, to see if he is a candidate for immunotherapy or targeted therapy. -I discussed his Chemotherapy has not been improved by his insurance. I spoke with our pharmacist and we will proceed with IV Oxaliplatin today as it is cheap enough to cover. May add Avastin at next cycle. His Xeloda is still being arranged, He will receive samples to start today. -Labs reviewed. Hg at 9.6 today, CMP, CEA and Iron panel are still pending. Overall adequate to proceed with Oxaliplatin today.  -I strongly encouraged him to contact clinic if he develops significant or unexpected side effects.  -F/u in 1 week for toxicity checkup.  2. Rectal pain, constipation and bleeding  -He is taking Tylenol 2-4 times a day, pain is overall manageable. He does have occasional severe pain, wakes him up at night. -Prescribed Tylenol 3, to use as needed for severe pain on 08/04/18 -He is to use stool softener, laxative such as MiraLAX or Senokot, to avoid constipation -Will closely monitor rectal bleeding and check iron panel.  -Pain remains controlled with Tylenol. I advised him to not take Tylenol more than 2-3 times a day as this can mask his fever. If he feels his Tylenol or Tylenol #3 is not enough I can call in stronger medication.   3. Anemia -I discussed this is related to his rectal bleeding  -His Hg dropped to 9.6 today (08/18/18). His Iron panel is still pending. If iron is low I will set up IV iron next week. -Continue oral iron    4. Goal of care  discussion  -We again discussed the incurable nature of his cancer, and the overall poor prognosis, especially if he does not have good response to chemotherapy or progress on chemo -The patient understands the goal of care is palliative. -He is full code for now  PLAN:  -Labs reviewed and adequate to proceed with Oxaliplatin today  -He will receive samples of Xeloda to start today  -Lab, flush and f/u with me at 4pm next Friday  -Lab, flush, f/u and Oxaliplatin and Avastin in 3 weeks  -I requested FO on his liver biopsy today   No orders of the defined types were placed in this encounter.   All questions were answered. The patient knows to call the clinic with any problems, questions or concerns. I spent 25 minutes counseling the patient face to face. The total time spent in the appointment was 30 minutes and more than 50% was on counseling.     Truitt Merle, MD 08/18/2018   I, Joslyn Devon, am acting as scribe for Truitt Merle, MD.   I have reviewed the above documentation for accuracy and completeness, and I agree with the above.

## 2018-08-18 NOTE — Telephone Encounter (Signed)
Oral Oncology Pharmacist Encounter  I met with patient and wife, Kristina, in infusion room today. We briefly reviewed Xeloda administration as well as possible side effects. We discussed mechanism of action of antinausea medications and appropriate timing of administration of each of the agents based upon Aloxi administration today. Patient and wife informed that I spoke with [facility] pharmacy yesterday afternoon, they do have his prescription, they have marked his prescription as urgent. They were instructed to call [facility] this afternoon to follow-up on status of prescription and provide alternate contact phone numbers and full insurance information. Patient provided physician samples of Xeloda 500 mg tablets so the he may start his Xeloda administration this evening. Enough Xeloda was provided to patient today for administration through Monday (08/22/2018) disease.  All questions answered. They know to call the office with any additional questions or concerns.  Jesse Mack, PharmD, BCPS, BCOP  08/18/2018 12:55 PM Oral Oncology Clinic [phone] 

## 2018-08-23 NOTE — Telephone Encounter (Signed)
Oral Oncology Pharmacist Encounter  Received call from patient that he still had not received his Xeloda prescription from [facility]. Patient was provided samples in the office on 08/18/2018, and he took his last dose of provider supplied samples last night (08/22/2018). Patient said he did call dispensing pharmacy yesterday and was told that his prescription was still in the benefits investigation phase.  I called [facility] today at [phone] to follow-up on patient's prescription status. Representative informed me that it remained in the benefits investigation phase. Representative questioned why this was still the case as the prescription was marked as urgent on 08/17/2018. Representative unable to transfer me to benefits verification department, however, he did get his team lead involved. Team lead stated that he would reach out to benefit verification department himself in an effort to reach out to the patient today to schedule his first shipment of Xeloda. Representative stated that they would contact me directly if they needed anything from the office, I provided direct dial to oral oncology clinic.  Patient questioned about any adverse events that he had experienced since his first administration of oxaliplatin that occurred on 08/18/2018. Patient states that he did have a pretty significant decrease in energy level that is starting to remit as of yesterday and today. Patient does state that he experienced some mild cold sensitivity that is remitting as well. Patient also stated that he experienced some head cloudiness after his infusion last Thursday and did not think that he would be able to drive himself home after his oxaliplatin infusions.  Patient informed of information that I received from dispensing pharmacy. I will continue to update patient with any additional information that I might receive as we are working to get his Xeloda prescription in his  hand.  Patient stated that he would reach out to [place] today at 3 PM. I will attempt to recontact dispensing pharmacy prior to leaving today as well.  Patient expressed understanding and appreciation. All questions answered. Patient is to call the office with any additional questions or concerns.  Billy Mendoza, PharmD, BCPS, BCOP  08/23/2018 11:35 AM Oral Oncology Clinic [phone]

## 2018-08-24 NOTE — Telephone Encounter (Signed)
Oral Oncology Pharmacist Encounter  I called [facility] to follow-up on patient Xeloda prescription. Representative stated that it was finally through the benefits verification phase and now is in the final phase of verification prior to be placed on a q. for the pharmacy to reach out to patient to schedule delivery of his first shipment of the Xeloda.  Representative question several times as to normal length of prescription processing time as it had been 96 business hours since prescription was received in the pharmacy. Representative unable to answer most of my questions.  I called patient and updated him about my call with [facility]. Patient instructed to call [facility] at about 4 PM today to see if he can go on and schedule his Xeloda shipment. Patient expressed understanding and appreciation.  Johny Drilling, PharmD, BCPS, BCOP  08/24/2018 3:18 PM Oral Oncology Clinic [phone]

## 2018-08-26 ENCOUNTER — Encounter: Payer: Self-pay | Admitting: Hematology

## 2018-08-26 ENCOUNTER — Inpatient Hospital Stay (HOSPITAL_BASED_OUTPATIENT_CLINIC_OR_DEPARTMENT_OTHER): Payer: BLUE CROSS/BLUE SHIELD | Admitting: Hematology

## 2018-08-26 ENCOUNTER — Inpatient Hospital Stay: Payer: BLUE CROSS/BLUE SHIELD

## 2018-08-26 VITALS — BP 166/105 | HR 95 | Temp 99.3°F | Resp 20 | Ht 73.0 in | Wt 252.6 lb

## 2018-08-26 DIAGNOSIS — C787 Secondary malignant neoplasm of liver and intrahepatic bile duct: Secondary | ICD-10-CM | POA: Diagnosis not present

## 2018-08-26 DIAGNOSIS — K5900 Constipation, unspecified: Secondary | ICD-10-CM | POA: Diagnosis not present

## 2018-08-26 DIAGNOSIS — K625 Hemorrhage of anus and rectum: Secondary | ICD-10-CM | POA: Diagnosis not present

## 2018-08-26 DIAGNOSIS — Z5111 Encounter for antineoplastic chemotherapy: Secondary | ICD-10-CM | POA: Diagnosis not present

## 2018-08-26 DIAGNOSIS — D500 Iron deficiency anemia secondary to blood loss (chronic): Secondary | ICD-10-CM

## 2018-08-26 DIAGNOSIS — C20 Malignant neoplasm of rectum: Secondary | ICD-10-CM

## 2018-08-26 DIAGNOSIS — Z95828 Presence of other vascular implants and grafts: Secondary | ICD-10-CM

## 2018-08-26 LAB — CMP (CANCER CENTER ONLY)
ALBUMIN: 2.9 g/dL — AB (ref 3.5–5.0)
ALT: 19 U/L (ref 0–44)
ANION GAP: 12 (ref 5–15)
AST: 25 U/L (ref 15–41)
Alkaline Phosphatase: 206 U/L — ABNORMAL HIGH (ref 38–126)
BUN: 17 mg/dL (ref 8–23)
CHLORIDE: 109 mmol/L (ref 98–111)
CO2: 21 mmol/L — AB (ref 22–32)
Calcium: 8.6 mg/dL — ABNORMAL LOW (ref 8.9–10.3)
Creatinine: 0.84 mg/dL (ref 0.61–1.24)
GFR, Estimated: >60 mL/min (ref >60)
GLUCOSE: 145 mg/dL — AB (ref 70–99)
Potassium: 3.7 mmol/L (ref 3.5–5.1)
SODIUM: 142 mmol/L (ref 135–145)
Total Bilirubin: 0.2 mg/dL — ABNORMAL LOW (ref 0.3–1.2)
Total Protein: 6.7 g/dL (ref 6.5–8.1)

## 2018-08-26 LAB — CBC WITH DIFFERENTIAL (CANCER CENTER ONLY)
ABS IMMATURE GRANULOCYTES: 0.21 10*3/uL — AB (ref 0.00–0.07)
BASOS ABS: 0.1 10*3/uL (ref 0.0–0.1)
Basophils Relative: 1 %
Eosinophils Absolute: 0.2 10*3/uL (ref 0.0–0.5)
Eosinophils Relative: 2 %
HEMATOCRIT: 28.7 % — AB (ref 39.0–52.0)
HEMOGLOBIN: 9.1 g/dL — AB (ref 13.0–17.0)
IMMATURE GRANULOCYTES: 2 %
LYMPHS ABS: 1.7 10*3/uL (ref 0.7–4.0)
LYMPHS PCT: 16 %
MCH: 29.6 pg (ref 26.0–34.0)
MCHC: 31.7 g/dL (ref 30.0–36.0)
MCV: 93.5 fL (ref 80.0–100.0)
MONOS PCT: 7 %
Monocytes Absolute: 0.8 10*3/uL (ref 0.1–1.0)
NEUTROS PCT: 72 %
NRBC: 0.2 % (ref 0.0–0.2)
Neutro Abs: 7.9 10*3/uL — ABNORMAL HIGH (ref 1.7–7.7)
Platelet Count: 230 10*3/uL (ref 150–400)
RBC: 3.07 MIL/uL — ABNORMAL LOW (ref 4.22–5.81)
RDW: 12.7 % (ref 11.5–15.5)
WBC Count: 10.8 10*3/uL — ABNORMAL HIGH (ref 4.0–10.5)

## 2018-08-26 MED ORDER — HEPARIN SOD (PORK) LOCK FLUSH 100 UNIT/ML IV SOLN
500.0000 [IU] | Freq: Once | INTRAVENOUS | Status: DC | PRN
  Filled 2018-08-26: qty 5

## 2018-08-26 MED ORDER — SODIUM CHLORIDE 0.9% FLUSH
10.0000 mL | INTRAVENOUS | Status: DC | PRN
  Administered 2018-08-26: 10 mL
  Filled 2018-08-26: qty 10

## 2018-08-26 NOTE — Progress Notes (Signed)
[facility]   [phone] [phone]) [phone]   Clinic Follow up Note   Patient Care Team: Merry Proud, MD as PCP - General (Internal Medicine) Truitt Merle, MD as Consulting Physician (Hematology) Carol Ada, MD as Consulting Physician (Gastroenterology)  Date of Service:  08/26/2018  CHIEF COMPLAINT: Follow up of metastatic rectal cancer  SUMMARY OF ONCOLOGIC HISTORY: Oncology History   Cancer Staging Rectal cancer metastasized to liver [facility]) Staging form: Colon and Rectum, AJCC 8th Edition - Clinical stage from 07/28/2018: Stage IVB (cTX, cNX, pM1b) - Signed by Truitt Merle, MD on 08/18/2018       Rectal cancer metastasized to liver ([place])   07/28/2018 Initial Biopsy    Biopsy 07/28/18 Final Microscopic Diagnosis A.Colon, Ascending, Polyp:  -Fragments of tubular adenoma -No high-grade dysplasia of malignancy.  B. Rectum, Mass, Polyp:  -Invasive adenocarcinoma, moderately - poorly differentiatred.     07/28/2018 Cancer Staging    Staging form: Colon and Rectum, AJCC 8th Edition - Clinical stage from 07/28/2018: Stage IVB (cTX, cNX, pM1b) - Signed by Truitt Merle, MD on 08/18/2018    08/04/2018 Initial Diagnosis    Rectal cancer metastasized to liver ([place])    08/16/2018 Pathology Results    Biospy  Diagnosis 08/16/18 Liver, needle/core biopsy - ADENOCARCINOMA. Microscopic Comment By morphology, the findings are compatible with the provided clinical history of colorectal adenocarcinoma. Results reported to [name] on 08/18/2018. Intradepartmental consultation was obtained ([name]).     08/18/2018 Procedure    Colonoscopy by Dr. Benson Norway showed malignant appearing partially obstructing tumor in the rectum, 8 cm above anal verge.    08/18/2018 -  Chemotherapy    CAPOX q3weeks with Xeloda 2000mg  BID 2 weeks on, 1 weeks off starting 08/18/18. May add Avastin with cycle 2      CURRENT THERAPY:  CAPOX q3weeks with Xeloda 2000mg  BID  2 weeks on, 1 week off started 08/18/18.   INTERVAL HISTORY:  Billy Mendoza is here for a follow up of rectal cancer post first cycle treatment last week. He presents to the clinic today by himself. He suggests I should call his wife to update her. He notes after first infusion he felt lightheaded with fogginess. He notes his BP is not low at home. He notes his cold sensitivity lasted 6 days, now resolved. Will be off Xeloda 09/01/18. He denies thrush, or GI issues. He does have dry skin.  He notes his Xeloda prescription still has not come, but has enough of the sample to get him to 3 more days. He notes, his wife's concern after standing up fast and experienced tunnel vision. After pausing this resolved and did not happen again. He notes a different episode of peripheral vision loss that went away a moment later and has not occurred again. He has been able to make all shifts of work and make it through the shift. He notes he does not consistently drink enough water.    REVIEW OF SYSTEMS:   Constitutional: Denies fevers, chills or abnormal weight loss (+) lightheadedness and fogginess Eyes: Denies blurriness of vision Ears, nose, mouth, throat, and face: Denies mucositis or sore throat Respiratory: Denies cough, dyspnea or wheezes Cardiovascular: Denies palpitation, chest discomfort or lower extremity swelling Gastrointestinal:  Denies nausea, heartburn or change in bowel habits Skin: Denies abnormal skin rashes (+) dry skin  Lymphatics: Denies new lymphadenopathy or easy bruising Neurological:Denies numbness, tingling or new weaknesses (+) cold sensitivity  Behavioral/Psych: Mood is stable, no new changes  All other systems were reviewed with the patient and are negative.  MEDICAL HISTORY:  History reviewed. No pertinent past medical history.  SURGICAL HISTORY: Past Surgical History:  Procedure Laterality Date  . IR IMAGING GUIDED PORT INSERTION  08/16/2018  . IR US GUIDE BX ASP/DRAIN   08/16/2018    I have reviewed the social history and family history with the patient and they are unchanged from previous note.  ALLERGIES:  has No Known Allergies.  MEDICATIONS:  Current Outpatient Medications  Medication Sig Dispense Refill  . acetaminophen (TYLENOL) 325 MG tablet Take 650 mg by mouth every 6 (six) hours as needed.    Marland Kitchen acetaminophen-codeine (TYLENOL #3) 300-30 MG tablet Take 1 tablet by mouth every 4 (four) hours as needed for moderate pain. 20 tablet 0  . capecitabine (XELODA) 500 MG tablet Take 4 tablets (2,000 mg total) by mouth 2 (two) times daily after a meal. Take on days 1-14 of chemotherapy. Repeat every 21 days. 112 tablet 0  . guaifenesin (ROBITUSSIN) 100 MG/5ML syrup Take 200 mg by mouth 3 (three) times daily as needed for cough.    Marland Kitchen ibuprofen (ADVIL,MOTRIN) 200 MG tablet Take 200 mg by mouth every 6 (six) hours as needed.    . lidocaine-prilocaine (EMLA) cream Apply to affected area once 30 g 3  . ondansetron (ZOFRAN) 8 MG tablet Take 1 tablet (8 mg total) by mouth 2 (two) times daily as needed for refractory nausea / vomiting. Start on day 3 after chemotherapy. 30 tablet 1  . polyethylene glycol (MIRALAX / GLYCOLAX) packet Take 17 g by mouth daily.    . prochlorperazine (COMPAZINE) 10 MG tablet Take 1 tablet (10 mg total) by mouth every 6 (six) hours as needed (Nausea or vomiting). 30 tablet 1   No current facility-administered medications for this visit.     PHYSICAL EXAMINATION: ECOG PERFORMANCE STATUS: 1 - Symptomatic but completely ambulatory  Vitals:   08/26/18 1554  BP: (!) 166/105  Pulse: 95  Resp: 20  Temp: 99.3 F (37.4 C)  SpO2: 96%   Filed Weights   08/26/18 1554  Weight: 252 lb 9.6 oz (114.6 kg)    GENERAL:alert, no distress and comfortable SKIN: skin color, texture, turgor are normal, no rashes or significant lesions EYES: normal, Conjunctiva are pink and non-injected, sclera clear OROPHARYNX:no exudate, no erythema and lips,  buccal mucosa, and tongue normal  NECK: supple, thyroid normal size, non-tender, without nodularity LYMPH:  no palpable lymphadenopathy in the cervical, axillary or inguinal LUNGS: clear to auscultation and percussion with normal breathing effort HEART: regular rate & rhythm and no murmurs and no lower extremity edema ABDOMEN:abdomen soft, non-tender and normal bowel sounds Musculoskeletal:no cyanosis of digits and no clubbing  NEURO: alert & oriented x 3 with fluent speech, no focal motor/sensory deficits  LABORATORY DATA:  I have reviewed the data as listed CBC Latest Ref Rng & Units 08/26/2018 08/18/2018 08/16/2018  WBC 4.0 - 10.5 K/uL 10.8(H) 8.7 9.3  Hemoglobin 13.0 - 17.0 g/dL 9.1(L) 9.6(L) 10.0(L)  Hematocrit 39.0 - 52.0 % 28.7(L) 30.1(L) 31.7(L)  Platelets 150 - 400 K/uL 230 365 366     CMP Latest Ref Rng & Units 08/26/2018 08/18/2018 08/16/2018  Glucose 70 - 99 mg/dL 145(H) 106(H) 114(H)  BUN 8 - 23 mg/dL 17 17 21   Creatinine 0.61 - 1.24 mg/dL 0.84 1.07 0.86  Sodium 135 - 145 mmol/L 142 145 140  Potassium 3.5 - 5.1 mmol/L 3.7 3.9 3.8  Chloride 98 - 111  mmol/L 109 111 110  CO2 22 - 32 mmol/L 21(L) 24 19(L)  Calcium 8.9 - 10.3 mg/dL 8.6(L) 9.1 8.6(L)  Total Protein 6.5 - 8.1 g/dL 6.7 7.0 7.0  Total Bilirubin 0.3 - 1.2 mg/dL 0.2(L) 0.3 0.6  Alkaline Phos 38 - 126 U/L 206(H) 210(H) 178(H)  AST 15 - 41 U/L 25 23 30   ALT 0 - 44 U/L 19 35 51(H)      RADIOGRAPHIC STUDIES: I have personally reviewed the radiological images as listed and agreed with the findings in the report. No results found.   ASSESSMENT & PLAN:  Billy Mendoza is a 67 y.o. male with   1.  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  -He has tolerated the first cycle chemo moderately well so far except for fatigue, cold  sensitivity and low appetite. Will complete Xeloda 08/31/2018. He has recovered well from the oxaliplatinum infusion last week -I encouraged him to contact me if he develops significant or unexpected side effects from Xeloda. We reviewed potential side effects from Xeloda again, especially hand-and-foot syndrome. -I strongly encouraged him to continue to eat adequately and drink at least 40 ounces of water daily. I suggest weighing himself at home.  -Labs reviewed, WBC at 10/8, Hg at 9/1, [place] at 7.9, CMP is still pending.  -Cycle 2 starts 09/08/18.  -F/u in 2 weeks   2. Rectal pain, constipation and bleeding -He is to use stool softener, laxative such as Miralax or Senokot, to avoid constipation. Constipation controlled  -Will closely monitor rectal bleeding and check iron panel.  -Pain remains controlled with Tylenol. I advised him to not take Tylenol more than 2-3 times a day as this can mask his fever. If he feels his Tylenol or Tylenol #3 is not enough I can call in stronger medication.   3. Anemia -This is related to his rectal bleeding  -Hg at 9.1 today (08/26/18) -Will monitor. If it drops further I may consider IV iron     PLAN:  -doing well overall with some expected side effects from chemo  -I will call his wife with update of this visit.  -Lab, flush, f/u and Oxaliplatin in 2 weeks     No problem-specific Assessment & Plan notes found for this encounter.   No orders of the defined types were placed in this encounter.  All questions were answered. The patient knows to call the clinic with any problems, questions or concerns. No barriers to learning was detected. I spent 15 minutes counseling the patient face to face. The total time spent in the appointment was 20 minutes and more than 50% was on counseling and review of test results     Truitt Merle, MD 08/26/2018   I, Joslyn Devon, am acting as scribe for Truitt Merle, MD.   I have reviewed the above documentation for  accuracy and completeness, and I agree with the above.

## 2018-08-29 ENCOUNTER — Telehealth: Payer: Self-pay | Admitting: Hematology

## 2018-08-29 ENCOUNTER — Encounter (HOSPITAL_COMMUNITY): Payer: Self-pay | Admitting: Hematology

## 2018-08-29 NOTE — Telephone Encounter (Signed)
No los °

## 2018-08-29 NOTE — Telephone Encounter (Signed)
Oral Oncology Patient Advocate Encounter  Confirmed with [facility] that Xeloda was delivered to the patient 08/26/18.  [place] [facility] [place] Phone [phone] Fax [phone]

## 2018-08-30 ENCOUNTER — Inpatient Hospital Stay (HOSPITAL_BASED_OUTPATIENT_CLINIC_OR_DEPARTMENT_OTHER): Payer: BLUE CROSS/BLUE SHIELD | Admitting: Hematology

## 2018-08-30 ENCOUNTER — Telehealth: Payer: Self-pay | Admitting: Hematology

## 2018-08-30 VITALS — BP 164/102 | HR 91 | Temp 98.6°F | Resp 20 | Ht 73.0 in | Wt 249.4 lb

## 2018-08-30 DIAGNOSIS — K5900 Constipation, unspecified: Secondary | ICD-10-CM | POA: Diagnosis not present

## 2018-08-30 DIAGNOSIS — K625 Hemorrhage of anus and rectum: Secondary | ICD-10-CM

## 2018-08-30 DIAGNOSIS — D500 Iron deficiency anemia secondary to blood loss (chronic): Secondary | ICD-10-CM

## 2018-08-30 DIAGNOSIS — C20 Malignant neoplasm of rectum: Secondary | ICD-10-CM

## 2018-08-30 DIAGNOSIS — C787 Secondary malignant neoplasm of liver and intrahepatic bile duct: Secondary | ICD-10-CM | POA: Diagnosis not present

## 2018-08-30 DIAGNOSIS — Z5111 Encounter for antineoplastic chemotherapy: Secondary | ICD-10-CM | POA: Diagnosis not present

## 2018-08-30 NOTE — Progress Notes (Signed)
[place]  [phone] [phone]) [phone]  Clinic Follow up Note   Patient Care Team: Merry Proud, MD as PCP - General (Internal Medicine) Truitt Merle, MD as Consulting Physician (Hematology) Carol Ada, MD as Consulting Physician (Gastroenterology) 08/30/2018   Chief Complaint: Discuss FO test result   SUMMARY OF ONCOLOGIC HISTORY: Oncology History   Cancer Staging Rectal cancer metastasized to liver [facility]) Staging form: Colon and Rectum, AJCC 8th Edition - Clinical stage from 07/28/2018: Stage IVB (cTX, cNX, pM1b) - Signed by Truitt Merle, MD on 08/18/2018       Rectal cancer metastasized to liver ([place])   07/28/2018 Initial Biopsy    Biopsy 07/28/18 Final Microscopic Diagnosis A.Colon, Ascending, Polyp:  -Fragments of tubular adenoma -No high-grade dysplasia of malignancy.  B. Rectum, Mass, Polyp:  -Invasive adenocarcinoma, moderately - poorly differentiatred.     07/28/2018 Cancer Staging    Staging form: Colon and Rectum, AJCC 8th Edition - Clinical stage from 07/28/2018: Stage IVB (cTX, cNX, pM1b) - Signed by Truitt Merle, MD on 08/18/2018    08/04/2018 Initial Diagnosis    Rectal cancer metastasized to liver ([place])    08/16/2018 Pathology Results    Biospy  Diagnosis 08/16/18 Liver, needle/core biopsy - ADENOCARCINOMA. Microscopic Comment By morphology, the findings are compatible with the provided clinical history of colorectal adenocarcinoma. Results reported to [name] on 08/18/2018. Intradepartmental consultation was obtained ([name]).     08/18/2018 Procedure    Colonoscopy by Dr. Benson Norway showed malignant appearing partially obstructing tumor in the rectum, 8 cm above anal verge.    08/18/2018 -  Chemotherapy    CAPOX q3weeks with Xeloda '2000mg'$  BID 2 weeks on, 1 weeks off starting 08/18/18. May add Avastin with cycle 2    CURRENT THERAPY CAPOX q3weeks with Xeloda '2000mg'$  BID 2 weeks on, 1 week off started  08/18/18. Will change to FOLFOXIRI from cycle 2   INTERVAL HISTORY: [place] Espina is a 67 y.o. male who is here for follow-up and discuss his foundation 1 genomic testing results.  He came into the clinic with his wife today.  He is tolerating Xarelto well overall, no significant skin toxicities.  Mild fatigue and low appetite is stable.  He will complete Xeloda tomorrow.  Pert inent positives and negatives of review of systems are listed and detailed within the above HPI.   REVIEW OF SYSTEMS:   Constitutional: Denies fevers, chills or abnormal weight loss, (+) fatigue  Eyes: Denies blurriness of vision Ears, nose, mouth, throat, and face: Denies mucositis or sore throat Respiratory: Denies cough, dyspnea or wheezes Cardiovascular: Denies palpitation, chest discomfort or lower extremity swelling Gastrointestinal:  Denies nausea, heartburn or change in bowel habits, (+) low appetite  Skin: Denies abnormal skin rashes Lymphatics: Denies new lymphadenopathy or easy bruising Neurological:Denies numbness, tingling or new weaknesses Behavioral/Psych: Mood is stable, no new changes  All other systems were reviewed with the patient and are negative.  MEDICAL HISTORY:  No past medical history on file.  SURGICAL HISTORY: Past Surgical History:  Procedure Laterality Date  . IR IMAGING GUIDED PORT INSERTION  08/16/2018  . IR US GUIDE BX ASP/DRAIN  08/16/2018    I have reviewed the social history and family history with the patient and they are unchanged from previous note.  ALLERGIES:  has No Known Allergies.  MEDICATIONS:  Current Outpatient Medications  Medication Sig Dispense Refill  . capecitabine (XELODA) 500 MG tablet Take 4 tablets (2,000 mg total) by mouth 2 (two)  times daily after a meal. Take on days 1-14 of chemotherapy. Repeat every 21 days. 112 tablet 0  . guaifenesin (ROBITUSSIN) 100 MG/5ML syrup Take 200 mg by mouth 3 (three) times daily as needed for cough.    Marland Kitchen ibuprofen  (ADVIL,MOTRIN) 200 MG tablet Take 200 mg by mouth every 6 (six) hours as needed.    . polyethylene glycol (MIRALAX / GLYCOLAX) packet Take 17 g by mouth daily.    Marland Kitchen acetaminophen (TYLENOL) 325 MG tablet Take 650 mg by mouth every 6 (six) hours as needed.    Marland Kitchen acetaminophen-codeine (TYLENOL #3) 300-30 MG tablet Take 1 tablet by mouth every 4 (four) hours as needed for moderate pain. (Patient not taking: Reported on 08/30/2018) 20 tablet 0  . lidocaine-prilocaine (EMLA) cream Apply to affected area once (Patient not taking: Reported on 08/30/2018) 30 g 3  . ondansetron (ZOFRAN) 8 MG tablet Take 1 tablet (8 mg total) by mouth 2 (two) times daily as needed for refractory nausea / vomiting. Start on day 3 after chemotherapy. (Patient not taking: Reported on 08/30/2018) 30 tablet 1  . prochlorperazine (COMPAZINE) 10 MG tablet Take 1 tablet (10 mg total) by mouth every 6 (six) hours as needed (Nausea or vomiting). (Patient not taking: Reported on 08/30/2018) 30 tablet 1   No current facility-administered medications for this visit.     PHYSICAL EXAMINATION: ECOG PERFORMANCE STATUS: 1 - Symptomatic but completely ambulatory  Vitals:   08/30/18 1552  BP: (!) 164/102  Pulse: 91  Resp: 20  Temp: 98.6 F (37 C)  SpO2: 98%   Filed Weights   08/30/18 1552  Weight: 249 lb 6.4 oz (113.1 kg)    GENERAL:alert, no distress and comfortable SKIN: skin color, texture, turgor are normal, no rashes or significant lesions EYES: normal, Conjunctiva are pink and non-injected, sclera clear OROPHARYNX:no exudate, no erythema and lips, buccal mucosa, and tongue normal  NECK: supple, thyroid normal size, non-tender, without nodularity LYMPH:  no palpable lymphadenopathy in the cervical, axillary or inguinal LUNGS: clear to auscultation and percussion with normal breathing effort HEART: regular rate & rhythm and no murmurs and no lower extremity edema ABDOMEN:abdomen soft, non-tender and normal bowel  sounds Musculoskeletal:no cyanosis of digits and no clubbing  NEURO: alert & oriented x 3 with fluent speech, no focal motor/sensory deficits  LABORATORY DATA:  I have reviewed the data as listed CBC Latest Ref Rng & Units 08/26/2018 08/18/2018 08/16/2018  WBC 4.0 - 10.5 K/uL 10.8(H) 8.7 9.3  Hemoglobin 13.0 - 17.0 g/dL 9.1(L) 9.6(L) 10.0(L)  Hematocrit 39.0 - 52.0 % 28.7(L) 30.1(L) 31.7(L)  Platelets 150 - 400 K/uL 230 365 366     CMP Latest Ref Rng & Units 08/26/2018 08/18/2018 08/16/2018  Glucose 70 - 99 mg/dL 145(H) 106(H) 114(H)  BUN 8 - 23 mg/dL _0 Creatinine 0.61 - 1.24 mg/dL 0.84 1.07 0.86  Sodium 135 - 145 mmol/L 142 145 140  Potassium 3.5 - 5.1 mmol/L 3.7 3.9 3.8  Chloride 98 - 111 mmol/L 109 111 110  CO2 22 - 32 mmol/L 21(L) 24 19(L)  Calcium 8.9 - 10.3 mg/dL 8.6(L) 9.1 8.6(L)  Total Protein 6.5 - 8.1 g/dL 6.7 7.0 7.0  Total Bilirubin 0.3 - 1.2 mg/dL 0.2(L) 0.3 0.6  Alkaline Phos 38 - 126 U/L 206(H) 210(H) 178(H)  AST 15 - 41 U/L _1 ALT 0 - 44 U/L 19 35 51(H)     RADIOGRAPHIC STUDIES: I have personally reviewed the radiological images  as listed and agreed with the findings in the report. No results found.   ASSESSMENT & PLAN:   Billy Mendoza is a 67 y.o. male with no significant past medical history, presented with rectal bleeding for 1 year, and recent rectal pain and weight loss.  1. Proximal rectal adenocarcinoma, metastatic to liver and likely tolungsandnodes -His staging CT scan image showed multiple liver, lung and thoracic node metastasis, and liver biopsy confirmed metastatic adenocarcinoma, consistent with colorectal primary.  -He started CAPOX q3weeks with Xeloda 2043m BID 2 weeks on, 1 week off on 08/18/2018. He tolerated the first cycle moderately well -I discussed his foundation one genomic testing results, which showed MSI stable disease, K-ras and and rise wild-type, BRAF V600E mutation. We discussed that BRAF mutated colorectal  cancer is much more aggressive, and less sensitive to chemotherapy.  I recommend changing his chemo to FOLFOXIRI and Avastin for better disease control. We also discussed targeted therapy options, such as BRAF inhibitor and EGFR inhibotor with chemo, and triple TKI (BRAF inhibitor encorafenib, MEK inhibitor binimetinib and EGFR inhibition cetuximab or panitumumab in the BEACON trial which is still ongoing) as next line therapy. He voiced good understanding  --Chemotherapy consent: Side effects including but does not not limited to, fatigue, nausea, vomiting, diarrhea, hair loss, neuropathy, fluid retention, renal and kidney dysfunction, neutropenic fever, needed for blood transfusion, bleeding, hypertension, proteinuria, hemorrhage, bowel perforation, were discussed with patient in great detail. He agrees to proceed. We particularly discussed the management of diarrhea, with Imodium and Lomotil. -Plan to change with second cycle chemo  2. Rectal pain and bleeding, constipation -He uses stool softeners and laxatives as needed -Pain is controlled with Tylenol  3. Anemia -Labs reviewed, he serum iron level is low, ferritin is normal, I encouraged him to use oral iron -We will monitor closely while he is on chemotherapy  4. Goal of care discussion  -We again discussed the incurable nature of his cancer, and the overall poor prognosis due to the BRAF mutation metastatic disease, especially if he does not have good response to chemotherapy or progress on chemo -The patient understands the goal of care is palliative. -He is full code now    Plan -He will complete first cycle CAPOX tomorrow, then off chemo for one week -change chemo to FOLFOXIRI and avastin from cycle 2 -RTC on 12/5   No problem-specific Assessment & Plan notes found for this encounter.   No orders of the defined types were placed in this encounter.  All questions were answered. The patient knows to call the clinic with any  problems, questions or concerns. No barriers to learning was detected. I spent 20 minutes counseling the patient face to face. The total time spent in the appointment was 30 minutes and more than 50% was on counseling and review of test results  I, Noor Dweik am acting as scribe for Dr. YTruitt Merle  I have reviewed the above documentation for accuracy and completeness, and I agree with the above.      YTruitt Merle MD 08/30/2018

## 2018-08-30 NOTE — Telephone Encounter (Signed)
Per MD schedule patient today

## 2018-09-01 ENCOUNTER — Encounter: Payer: Self-pay | Admitting: Hematology

## 2018-09-01 NOTE — Progress Notes (Signed)
DISCONTINUE ON PATHWAY REGIMEN - Colorectal     A cycle is every 21 days:     Capecitabine      Oxaliplatin      Bevacizumab-xxxx   **Always confirm dose/schedule in your pharmacy ordering system**  REASON: Other Reason PRIOR TREATMENT: [id number]: CapeOx + Bevacizumab q21 Days TREATMENT RESPONSE: Unable to Evaluate  START OFF PATHWAY REGIMEN - Colorectal   [id number]:FOLFOXIRI (5-Fluorouracil 2,400 mg/m2/cycle) + Bevacizumab 5 mg/kg IV D1 q14 Days:   A cycle is every 14 days:     Bevacizumab-xxxx      Irinotecan      Oxaliplatin      Leucovorin      5-Fluorouracil   **Always confirm dose/schedule in your pharmacy ordering system**  Patient Characteristics: Distant Metastases, First Line, Nonsurgical Candidate, KRAS/NRAS Wild-Type, BRAF Mutation Positive, PS = 0,1; Bevacizumab Eligible Therapeutic Status: Distant Metastases BRAF Mutation Status: Mutation Positive KRAS/NRAS Mutation Status: Wild-Type (no mutation) Line of Therapy: First Environmental consultant Status: PS = 0, 1 Bevacizumab Eligibility: Eligible Intent of Therapy: Non-Curative / Palliative Intent, Discussed with Patient

## 2018-09-02 ENCOUNTER — Telehealth: Payer: Self-pay | Admitting: Hematology

## 2018-09-02 NOTE — Telephone Encounter (Signed)
Called patient and patient was okay with his appointments not being able to be moved to early morning(dec 5), because of availability.  Also verified patient upcoming appointments Sep 22 2018

## 2018-09-04 ENCOUNTER — Other Ambulatory Visit: Payer: Self-pay | Admitting: Hematology

## 2018-09-05 ENCOUNTER — Telehealth: Payer: Self-pay | Admitting: Hematology

## 2018-09-05 NOTE — Telephone Encounter (Signed)
Called regarding 12/3 and 12/4

## 2018-09-06 ENCOUNTER — Inpatient Hospital Stay: Payer: BLUE CROSS/BLUE SHIELD

## 2018-09-06 ENCOUNTER — Inpatient Hospital Stay: Payer: BLUE CROSS/BLUE SHIELD | Attending: Hematology

## 2018-09-06 DIAGNOSIS — Z452 Encounter for adjustment and management of vascular access device: Secondary | ICD-10-CM | POA: Diagnosis not present

## 2018-09-06 DIAGNOSIS — C20 Malignant neoplasm of rectum: Secondary | ICD-10-CM | POA: Insufficient documentation

## 2018-09-06 DIAGNOSIS — D6481 Anemia due to antineoplastic chemotherapy: Secondary | ICD-10-CM | POA: Diagnosis not present

## 2018-09-06 DIAGNOSIS — C787 Secondary malignant neoplasm of liver and intrahepatic bile duct: Secondary | ICD-10-CM | POA: Insufficient documentation

## 2018-09-06 DIAGNOSIS — K5900 Constipation, unspecified: Secondary | ICD-10-CM | POA: Diagnosis not present

## 2018-09-06 DIAGNOSIS — Z95828 Presence of other vascular implants and grafts: Secondary | ICD-10-CM

## 2018-09-06 DIAGNOSIS — I10 Essential (primary) hypertension: Secondary | ICD-10-CM | POA: Diagnosis not present

## 2018-09-06 DIAGNOSIS — Z5111 Encounter for antineoplastic chemotherapy: Secondary | ICD-10-CM | POA: Insufficient documentation

## 2018-09-06 DIAGNOSIS — Z5112 Encounter for antineoplastic immunotherapy: Secondary | ICD-10-CM | POA: Diagnosis present

## 2018-09-06 DIAGNOSIS — Z5189 Encounter for other specified aftercare: Secondary | ICD-10-CM | POA: Insufficient documentation

## 2018-09-06 LAB — CBC WITH DIFFERENTIAL (CANCER CENTER ONLY)
Abs Immature Granulocytes: 0.07 10*3/uL (ref 0.00–0.07)
Basophils Absolute: 0.1 10*3/uL (ref 0.0–0.1)
Basophils Relative: 1 %
Eosinophils Absolute: 0.2 10*3/uL (ref 0.0–0.5)
Eosinophils Relative: 3 %
HCT: 30.2 % — ABNORMAL LOW (ref 39.0–52.0)
Hemoglobin: 9.5 g/dL — ABNORMAL LOW (ref 13.0–17.0)
Immature Granulocytes: 1 %
Lymphocytes Relative: 25 %
Lymphs Abs: 1.7 10*3/uL (ref 0.7–4.0)
MCH: 30.2 pg (ref 26.0–34.0)
MCHC: 31.5 g/dL (ref 30.0–36.0)
MCV: 95.9 fL (ref 80.0–100.0)
Monocytes Absolute: 0.7 10*3/uL (ref 0.1–1.0)
Monocytes Relative: 10 %
Neutro Abs: 4.4 10*3/uL (ref 1.7–7.7)
Neutrophils Relative %: 60 %
Platelet Count: 263 10*3/uL (ref 150–400)
RBC: 3.15 MIL/uL — ABNORMAL LOW (ref 4.22–5.81)
RDW: 15.8 % — ABNORMAL HIGH (ref 11.5–15.5)
WBC Count: 7.1 10*3/uL (ref 4.0–10.5)
nRBC: 0 % (ref 0.0–0.2)

## 2018-09-06 LAB — CMP (CANCER CENTER ONLY)
ALBUMIN: 3 g/dL — AB (ref 3.5–5.0)
ALT: 17 U/L (ref 0–44)
AST: 23 U/L (ref 15–41)
Alkaline Phosphatase: 144 U/L — ABNORMAL HIGH (ref 38–126)
Anion gap: 7 (ref 5–15)
BUN: 18 mg/dL (ref 8–23)
CO2: 24 mmol/L (ref 22–32)
Calcium: 8.7 mg/dL — ABNORMAL LOW (ref 8.9–10.3)
Chloride: 111 mmol/L (ref 98–111)
Creatinine: 1.04 mg/dL (ref 0.61–1.24)
GFR, Est AFR Am: >60 mL/min (ref >60)
GFR, Estimated: >60 mL/min (ref >60)
GLUCOSE: 95 mg/dL (ref 70–99)
Potassium: 4.3 mmol/L (ref 3.5–5.1)
Sodium: 142 mmol/L (ref 135–145)
Total Bilirubin: 0.3 mg/dL (ref 0.3–1.2)
Total Protein: 7 g/dL (ref 6.5–8.1)

## 2018-09-06 LAB — TOTAL PROTEIN, URINE DIPSTICK: Protein, ur: <30 mg/dL — AB

## 2018-09-06 MED ORDER — HEPARIN SOD (PORK) LOCK FLUSH 100 UNIT/ML IV SOLN
500.0000 [IU] | Freq: Once | INTRAVENOUS | Status: AC | PRN
  Administered 2018-09-06: 500 [IU]
  Filled 2018-09-06: qty 5

## 2018-09-06 MED ORDER — SODIUM CHLORIDE 0.9% FLUSH
10.0000 mL | INTRAVENOUS | Status: DC | PRN
  Administered 2018-09-06: 10 mL
  Filled 2018-09-06: qty 10

## 2018-09-06 NOTE — Progress Notes (Signed)
[place]  Telephone:(336) [phone] Fax:(336) [phone]  Clinic Follow up Note   Patient Care Team: Merry Proud, MD as PCP - General (Internal Medicine) Truitt Merle, MD as Consulting Physician (Hematology) Carol Ada, MD as Consulting Physician (Gastroenterology) 09/07/2018   Chief Complaint: F/u on rectal cancer  SUMMARY OF ONCOLOGIC HISTORY: Oncology History   Cancer Staging Rectal cancer metastasized to liver [facility]) Staging form: Colon and Rectum, AJCC 8th Edition - Clinical stage from 07/28/2018: Stage IVB (cTX, cNX, pM1b) - Signed by Truitt Merle, MD on 08/18/2018       Rectal cancer metastasized to liver ([place])   07/28/2018 Initial Biopsy    Biopsy 07/28/18 Final Microscopic Diagnosis A.Colon, Ascending, Polyp:  -Fragments of tubular adenoma -No high-grade dysplasia of malignancy.  B. Rectum, Mass, Polyp:  -Invasive adenocarcinoma, moderately - poorly differentiatred.     07/28/2018 Cancer Staging    Staging form: Colon and Rectum, AJCC 8th Edition - Clinical stage from 07/28/2018: Stage IVB (cTX, cNX, pM1b) - Signed by Truitt Merle, MD on 08/18/2018    08/04/2018 Initial Diagnosis    Rectal cancer metastasized to liver ([place])    08/16/2018 Pathology Results    Biospy  Diagnosis 08/16/18 Liver, needle/core biopsy - ADENOCARCINOMA. Microscopic Comment By morphology, the findings are compatible with the provided clinical history of colorectal adenocarcinoma. Results reported to [name] on 08/18/2018. Intradepartmental consultation was obtained ([name]).     08/18/2018 Procedure    Colonoscopy by Dr. Benson Norway showed malignant appearing partially obstructing tumor in the rectum, 8 cm above anal verge.    08/18/2018 -  Chemotherapy    CAPOX q3weeks with Xeloda '2000mg'$  BID 2 weeks on, 1 weeks off starting 08/18/18. May add Avastin with cycle 2    09/07/2018 -  Chemotherapy    The patient had palonosetron (ALOXI) injection 0.25 mg, 0.25 mg,  Intravenous,  Once, 1 of 6 cycles Administration: 0.25 mg (09/07/2018) pegfilgrastim-cbqv (UDENYCA) injection 6 mg, 6 mg, Subcutaneous, Once, 1 of 6 cycles irinotecan (CAMPTOSAR) 360 mg in dextrose 5 % 500 mL chemo infusion, 150 mg/m2 = 360 mg (100 % of original dose 150 mg/m2), Intravenous,  Once, 1 of 6 cycles Dose modification: 150 mg/m2 (original dose 150 mg/m2, Cycle 1, Reason: Provider Judgment) Administration: 360 mg (09/07/2018) leucovorin 482 mg in dextrose 5 % 250 mL infusion, 200 mg/m2 = 482 mg, Intravenous,  Once, 1 of 6 cycles Administration: 482 mg (09/07/2018) oxaliplatin (ELOXATIN) 200 mg in dextrose 5 % 500 mL chemo infusion, 82 mg/m2 = 205 mg, Intravenous,  Once, 1 of 6 cycles Administration: 200 mg (09/07/2018) fluorouracil (ADRUCIL) 6,750 mg in sodium chloride 0.9 % 115 mL chemo infusion, 2,800 mg/m2 = 6,750 mg (100 % of original dose 2,800 mg/m2), Intravenous, 1 Day/Dose, 1 of 6 cycles Dose modification: 2,800 mg/m2 (original dose 2,800 mg/m2, Cycle 1, Reason: Provider Judgment) Administration: 6,750 mg (09/07/2018)  for chemotherapy treatment.     09/07/2018 -  Chemotherapy    The patient had bevacizumab (AVASTIN) 600 mg in sodium chloride 0.9 % 100 mL chemo infusion, 5.2 mg/kg = 575 mg, Intravenous,  Once, 1 of 6 cycles Administration: 600 mg (09/07/2018)  for chemotherapy treatment.     CURRENT THERAPY FOLFOXIRI and Avastin  INTERVAL HISTORY: Billy Mendoza is a 67 y.o. male who is here for follow-up and first cycle FOFOXIRI.  He is here with his wife in the infusion room. He is eating well and is gaining weight. He has recovered very well from  last cycle CAPOX.   Pertinent positives and negatives of review of systems are listed and detailed within the above HPI.   REVIEW OF SYSTEMS:   Constitutional: Denies fevers, chills or abnormal weight loss (+) good appetite (+) weight gain  Eyes: Denies blurriness of vision Ears, nose, mouth, throat, and face: Denies mucositis  or sore throat Respiratory: Denies cough, dyspnea or wheezes Cardiovascular: Denies palpitation, chest discomfort or lower extremity swelling Gastrointestinal:  Denies nausea, heartburn or change in bowel habits Skin: Denies abnormal skin rashes Lymphatics: Denies new lymphadenopathy or easy bruising Neurological:Denies numbness, tingling or new weaknesses Behavioral/Psych: Mood is stable, no new changes  All other systems were reviewed with the patient and are negative.  MEDICAL HISTORY:  No past medical history on file.  SURGICAL HISTORY: Past Surgical History:  Procedure Laterality Date  . IR IMAGING GUIDED PORT INSERTION  08/16/2018  . IR US GUIDE BX ASP/DRAIN  08/16/2018    I have reviewed the social history and family history with the patient and they are unchanged from previous note.  ALLERGIES:  has No Known Allergies.  MEDICATIONS:  Current Outpatient Medications  Medication Sig Dispense Refill  . acetaminophen (TYLENOL) 325 MG tablet Take 650 mg by mouth every 6 (six) hours as needed.    Marland Kitchen acetaminophen-codeine (TYLENOL #3) 300-30 MG tablet Take 1 tablet by mouth every 4 (four) hours as needed for moderate pain. (Patient not taking: Reported on 08/30/2018) 20 tablet 0  . diphenoxylate-atropine (LOMOTIL) 2.5-0.025 MG tablet Take 1-2 tablets by mouth 4 (four) times daily as needed for diarrhea or loose stools. Up to 8 tabs daily for severe diarrhea 60 tablet 2  . guaifenesin (ROBITUSSIN) 100 MG/5ML syrup Take 200 mg by mouth 3 (three) times daily as needed for cough.    Marland Kitchen ibuprofen (ADVIL,MOTRIN) 200 MG tablet Take 200 mg by mouth every 6 (six) hours as needed.    . polyethylene glycol (MIRALAX / GLYCOLAX) packet Take 17 g by mouth daily.     No current facility-administered medications for this visit.     PHYSICAL EXAMINATION: ECOG PERFORMANCE STATUS: 0 - Asymptomatic Vitals: BP 142/89 Pulse 65 RR 18 BMI 33.45  GENERAL:alert, no distress and comfortable SKIN: skin  color, texture, turgor are normal, no rashes or significant lesions EYES: normal, Conjunctiva are pink and non-injected, sclera clear OROPHARYNX:no exudate, no erythema and lips, buccal mucosa, and tongue normal  NECK: supple, thyroid normal size, non-tender, without nodularity LYMPH:  no palpable lymphadenopathy in the cervical, axillary or inguinal LUNGS: clear to auscultation and percussion with normal breathing effort HEART: regular rate & rhythm and no murmurs and no lower extremity edema ABDOMEN:abdomen soft, non-tender and normal bowel sounds Musculoskeletal:no cyanosis of digits and no clubbing  NEURO: alert & oriented x 3 with fluent speech, no focal motor/sensory deficits  LABORATORY DATA:  I have reviewed the data as listed CBC Latest Ref Rng & Units 09/06/2018 08/26/2018 08/18/2018  WBC 4.0 - 10.5 K/uL 7.1 10.8(H) 8.7  Hemoglobin 13.0 - 17.0 g/dL 9.5(L) 9.1(L) 9.6(L)  Hematocrit 39.0 - 52.0 % 30.2(L) 28.7(L) 30.1(L)  Platelets 150 - 400 K/uL 263 230 365     CMP Latest Ref Rng & Units 09/06/2018 08/26/2018 08/18/2018  Glucose 70 - 99 mg/dL 95 145(H) 106(H)  BUN 8 - 23 mg/dL _0 Creatinine 0.61 - 1.24 mg/dL 1.04 0.84 1.07  Sodium 135 - 145 mmol/L 142 142 145  Potassium 3.5 - 5.1 mmol/L 4.3 3.7 3.9  Chloride 98 -  111 mmol/L 111 109 111  CO2 22 - 32 mmol/L 24 21(L) 24  Calcium 8.9 - 10.3 mg/dL 8.7(L) 8.6(L) 9.1  Total Protein 6.5 - 8.1 g/dL 7.0 6.7 7.0  Total Bilirubin 0.3 - 1.2 mg/dL 0.3 0.2(L) 0.3  Alkaline Phos 38 - 126 U/L 144(H) 206(H) 210(H)  AST 15 - 41 U/L _0 ALT 0 - 44 U/L 17 19 35      RADIOGRAPHIC STUDIES: I have personally reviewed the radiological images as listed and agreed with the findings in the report. No results found.   ASSESSMENT & PLAN:  Billy Mendoza is a 67 y.o. male with history of  1. Proximal rectal adenocarcinoma, metastatic to liver and likely tolungsandnodes -Diagnosed in 07/2018.  -tolerated first cycle CAPOX well  overall.  -due to BRAF mutation, I changed his chemo to FOLFOXIRI and Avastin. -Labs reviewed, CBC showed Hg 9.5. CMP showed 8.7 albumin 3 ALP 144. -I educated him about side effects of his treatment and advised him to use Claritin to prevent reactions. He knows to use imodium for diarrhea and tylenol for the pain.  -He knows to call or go to the ER if he gets dehydrated or had any concerns.  -I advised him to stay hydrated and not hesitate to come for IVF if needed.  -I will prescribe lomotil. He will use imodium if he had >2 BM and lomotil if >4 BMs.  -Today's chemo dose will be reduced. -I will schedule for IVF with pump d/c -f/u in 2 weeks with las and cycle 2 chemo   2. Rectal pain and bleeding, constipation -He uses stool softeners and laxatives for his constipation. -He uses Tylenol for the pain  3. Anemia secondary to cancer and chemotherapy - Labs reviewed, Hg 9.5, stable   4. Goal of care discussion -He is full code now -He understands his disease is incurable, and the goal of therapy is palliative to prolong his life.    Plan  -Will prescribe Lomotil -Labs reviewed, good to proceed with chemo FOLFOXIRI and avastin with reduced dose today -will schedule for IVF with pump d/c -f/u in 2 weeks with labs and chemo   No problem-specific Assessment & Plan notes found for this encounter.   No orders of the defined types were placed in this encounter.  All questions were answered. The patient knows to call the clinic with any problems, questions or concerns. No barriers to learning was detected.  I spent 15 minutes counseling the patient face to face. The total time spent in the appointment was 20 minutes and more than 50% was on counseling and review of test results  I, Noor Dweik am acting as scribe for [name].  I have reviewed the above documentation for accuracy and completeness, and I agree with the above.      Truitt Merle, MD 09/07/2018

## 2018-09-07 ENCOUNTER — Inpatient Hospital Stay: Payer: BLUE CROSS/BLUE SHIELD

## 2018-09-07 ENCOUNTER — Encounter: Payer: Self-pay | Admitting: Hematology

## 2018-09-07 ENCOUNTER — Inpatient Hospital Stay (HOSPITAL_BASED_OUTPATIENT_CLINIC_OR_DEPARTMENT_OTHER): Payer: BLUE CROSS/BLUE SHIELD | Admitting: Hematology

## 2018-09-07 VITALS — BP 142/89 | HR 65 | Temp 98.2°F | Resp 18 | Ht 73.0 in | Wt 253.5 lb

## 2018-09-07 DIAGNOSIS — D6481 Anemia due to antineoplastic chemotherapy: Secondary | ICD-10-CM | POA: Diagnosis not present

## 2018-09-07 DIAGNOSIS — K5900 Constipation, unspecified: Secondary | ICD-10-CM

## 2018-09-07 DIAGNOSIS — C20 Malignant neoplasm of rectum: Secondary | ICD-10-CM

## 2018-09-07 DIAGNOSIS — C787 Secondary malignant neoplasm of liver and intrahepatic bile duct: Principal | ICD-10-CM

## 2018-09-07 DIAGNOSIS — Z5112 Encounter for antineoplastic immunotherapy: Secondary | ICD-10-CM | POA: Diagnosis not present

## 2018-09-07 DIAGNOSIS — Z7189 Other specified counseling: Secondary | ICD-10-CM

## 2018-09-07 MED ORDER — SODIUM CHLORIDE 0.9 % IV SOLN
Freq: Once | INTRAVENOUS | Status: AC
  Administered 2018-09-07: 09:00:00 via INTRAVENOUS
  Filled 2018-09-07: qty 250

## 2018-09-07 MED ORDER — SODIUM CHLORIDE 0.9 % IV SOLN
5.2000 mg/kg | Freq: Once | INTRAVENOUS | Status: AC
  Administered 2018-09-07: 600 mg via INTRAVENOUS
  Filled 2018-09-07: qty 16

## 2018-09-07 MED ORDER — IRINOTECAN HCL CHEMO INJECTION 100 MG/5ML
150.0000 mg/m2 | Freq: Once | INTRAVENOUS | Status: AC
  Administered 2018-09-07: 360 mg via INTRAVENOUS
  Filled 2018-09-07: qty 15

## 2018-09-07 MED ORDER — IBUPROFEN 200 MG PO TABS
ORAL_TABLET | ORAL | Status: AC
  Filled 2018-09-07: qty 2

## 2018-09-07 MED ORDER — PALONOSETRON HCL INJECTION 0.25 MG/5ML
INTRAVENOUS | Status: AC
  Filled 2018-09-07: qty 5

## 2018-09-07 MED ORDER — SODIUM CHLORIDE 0.9 % IV SOLN: 10.0000 mg | Freq: Once | INTRAVENOUS | Status: DC

## 2018-09-07 MED ORDER — ATROPINE SULFATE 1 MG/ML IJ SOLN: 0.5000 mg | Freq: Once | INTRAMUSCULAR | Status: DC | PRN

## 2018-09-07 MED ORDER — SODIUM CHLORIDE 0.9 % IV SOLN
2800.0000 mg/m2 | INTRAVENOUS | Status: DC
  Administered 2018-09-07: 6750 mg via INTRAVENOUS
  Filled 2018-09-07: qty 135

## 2018-09-07 MED ORDER — DEXTROSE 5 % IV SOLN
Freq: Once | INTRAVENOUS | Status: AC
  Administered 2018-09-07: 08:00:00 via INTRAVENOUS
  Filled 2018-09-07: qty 250

## 2018-09-07 MED ORDER — DEXAMETHASONE SODIUM PHOSPHATE 10 MG/ML IJ SOLN
10.0000 mg | Freq: Once | INTRAMUSCULAR | Status: AC
  Administered 2018-09-07: 10 mg via INTRAVENOUS

## 2018-09-07 MED ORDER — IBUPROFEN 200 MG PO TABS
400.0000 mg | ORAL_TABLET | Freq: Once | ORAL | Status: AC
  Administered 2018-09-07: 400 mg via ORAL

## 2018-09-07 MED ORDER — LEUCOVORIN CALCIUM INJECTION 350 MG
200.0000 mg/m2 | Freq: Once | INTRAVENOUS | Status: AC
  Administered 2018-09-07: 482 mg via INTRAVENOUS
  Filled 2018-09-07: qty 24.1

## 2018-09-07 MED ORDER — DEXAMETHASONE SODIUM PHOSPHATE 10 MG/ML IJ SOLN
INTRAMUSCULAR | Status: AC
  Filled 2018-09-07: qty 1

## 2018-09-07 MED ORDER — OXALIPLATIN CHEMO INJECTION 100 MG/20ML
82.0000 mg/m2 | Freq: Once | INTRAVENOUS | Status: AC
  Administered 2018-09-07: 200 mg via INTRAVENOUS
  Filled 2018-09-07: qty 40

## 2018-09-07 MED ORDER — DIPHENOXYLATE-ATROPINE 2.5-0.025 MG PO TABS: 1.0000 | ORAL_TABLET | Freq: Four times a day (QID) | ORAL | 2 refills | Status: AC | PRN

## 2018-09-07 MED ORDER — PALONOSETRON HCL INJECTION 0.25 MG/5ML
0.2500 mg | Freq: Once | INTRAVENOUS | Status: AC
  Administered 2018-09-07: 0.25 mg via INTRAVENOUS

## 2018-09-07 MED ORDER — ATROPINE SULFATE 1 MG/ML IJ SOLN
INTRAMUSCULAR | Status: AC
  Filled 2018-09-07: qty 1

## 2018-09-07 NOTE — Patient Instructions (Signed)
[facility] Discharge Instructions for Patients Receiving Chemotherapy  Today you received the following chemotherapy agents Bevacizumab (AVASTIN), Irinotecan (CAMPTOSAR), Oxaliplatin (ELOXATIN), Leucovorin & Fluorouracil (ADRUCIL).  To help prevent nausea and vomiting after your treatment, we encourage you to take your nausea medication as prescribed.   If you develop nausea and vomiting that is not controlled by your nausea medication, call the clinic.   BELOW ARE SYMPTOMS THAT SHOULD BE REPORTED IMMEDIATELY:  *FEVER GREATER THAN 100.5 F  *CHILLS WITH OR WITHOUT FEVER  NAUSEA AND VOMITING THAT IS NOT CONTROLLED WITH YOUR NAUSEA MEDICATION  *UNUSUAL SHORTNESS OF BREATH  *UNUSUAL BRUISING OR BLEEDING  TENDERNESS IN MOUTH AND THROAT WITH OR WITHOUT PRESENCE OF ULCERS  *URINARY PROBLEMS  *BOWEL PROBLEMS  UNUSUAL RASH Items with * indicate a potential emergency and should be followed up as soon as possible.  Feel free to call the clinic should you have any questions or concerns. The clinic phone number is [phone].  Please show the [facility] at check-in to the Emergency Department and triage nurse.  Bevacizumab injection What is this medicine? BEVACIZUMAB (be va SIZ yoo mab) is a monoclonal antibody. It is used to treat many types of cancer. This medicine may be used for other purposes; ask your health care provider or pharmacist if you have questions. COMMON BRAND NAME(S): Avastin What should I tell my health care provider before I take this medicine? They need to know if you have any of these conditions: -diabetes -heart disease -high blood pressure -history of coughing up blood -prior anthracycline chemotherapy (e.g., doxorubicin, daunorubicin, epirubicin) -recent or ongoing radiation therapy -recent or planning to have surgery -stroke -an unusual or allergic reaction to bevacizumab, hamster proteins, mouse proteins, other medicines, foods,  dyes, or preservatives -pregnant or trying to get pregnant -breast-feeding How should I use this medicine? This medicine is for infusion into a vein. It is given by a health care professional in a hospital or clinic setting. Talk to your pediatrician regarding the use of this medicine in children. Special care may be needed. Overdosage: If you think you have taken too much of this medicine contact a poison control center or emergency room at once. NOTE: This medicine is only for you. Do not share this medicine with others. What if I miss a dose? It is important not to miss your dose. Call your doctor or health care professional if you are unable to keep an appointment. What may interact with this medicine? Interactions are not expected. This list may not describe all possible interactions. Give your health care provider a list of all the medicines, herbs, non-prescription drugs, or dietary supplements you use. Also tell them if you smoke, drink alcohol, or use illegal drugs. Some items may interact with your medicine. What should I watch for while using this medicine? Your condition will be monitored carefully while you are receiving this medicine. You will need important blood work and urine testing done while you are taking this medicine. This medicine may increase your risk to bruise or bleed. Call your doctor or health care professional if you notice any unusual bleeding. This medicine should be started at least 28 days following major surgery and the site of the surgery should be totally healed. Check with your doctor before scheduling dental work or surgery while you are receiving this treatment. Talk to your doctor if you have recently had surgery or if you have a wound that has not healed. Do not become  pregnant while taking this medicine or for 6 months after stopping it. Women should inform their doctor if they wish to become pregnant or think they might be pregnant. There is a potential  for serious side effects to an unborn child. Talk to your health care professional or pharmacist for more information. Do not breast-feed an infant while taking this medicine and for 6 months after the last dose. This medicine has caused ovarian failure in some women. This medicine may interfere with the ability to have a child. You should talk to your doctor or health care professional if you are concerned about your fertility. What side effects may I notice from receiving this medicine? Side effects that you should report to your doctor or health care professional as soon as possible: -allergic reactions like skin rash, itching or hives, swelling of the face, lips, or tongue -chest pain or chest tightness -chills -coughing up blood -high fever -seizures -severe constipation -signs and symptoms of bleeding such as bloody or black, tarry stools; red or dark-brown urine; spitting up blood or brown material that looks like coffee grounds; red spots on the skin; unusual bruising or bleeding from the eye, gums, or nose -signs and symptoms of a blood clot such as breathing problems; chest pain; severe, sudden headache; pain, swelling, warmth in the leg -signs and symptoms of a stroke like changes in vision; confusion; trouble speaking or understanding; severe headaches; sudden numbness or weakness of the face, arm or leg; trouble walking; dizziness; loss of balance or coordination -stomach pain -sweating -swelling of legs or ankles -vomiting -weight gain Side effects that usually do not require medical attention (report to your doctor or health care professional if they continue or are bothersome): -back pain -changes in taste -decreased appetite -dry skin -nausea -tiredness This list may not describe all possible side effects. Call your doctor for medical advice about side effects. You may report side effects to FDA at 1-800-FDA-1088. Where should I keep my medicine? This drug is given in a  hospital or clinic and will not be stored at home. NOTE: This sheet is a summary. It may not cover all possible information. If you have questions about this medicine, talk to your doctor, pharmacist, or health care provider.  2018 Elsevier/Gold Standard (2016-09-18 14:33:29)  Irinotecan injection What is this medicine? IRINOTECAN (ir in oh TEE kan ) is a chemotherapy drug. It is used to treat colon and rectal cancer. This medicine may be used for other purposes; ask your health care provider or pharmacist if you have questions. COMMON BRAND NAME(S): Camptosar What should I tell my health care provider before I take this medicine? They need to know if you have any of these conditions: -blood disorders -dehydration -diarrhea -infection (especially a virus infection such as chickenpox, cold sores, or herpes) -liver disease -low blood counts, like low white cell, platelet, or red cell counts -recent or ongoing radiation therapy -an unusual or allergic reaction to irinotecan, sorbitol, other chemotherapy, other medicines, foods, dyes, or preservatives -pregnant or trying to get pregnant -breast-feeding How should I use this medicine? This drug is given as an infusion into a vein. It is administered in a hospital or clinic by a specially trained health care professional. Talk to your pediatrician regarding the use of this medicine in children. Special care may be needed. Overdosage: If you think you have taken too much of this medicine contact a poison control center or emergency room at once. NOTE: This medicine is only for  you. Do not share this medicine with others. What if I miss a dose? It is important not to miss your dose. Call your doctor or health care professional if you are unable to keep an appointment. What may interact with this medicine? Do not take this medicine with any of the following medications: -atazanavir -certain medicines for fungal infections like itraconazole and  ketoconazole -St. John's Wort This medicine may also interact with the following medications: -dexamethasone -diuretics -laxatives -medicines for seizures like carbamazepine, mephobarbital, phenobarbital, phenytoin, primidone -medicines to increase blood counts like filgrastim, pegfilgrastim, sargramostim -prochlorperazine -vaccines This list may not describe all possible interactions. Give your health care provider a list of all the medicines, herbs, non-prescription drugs, or dietary supplements you use. Also tell them if you smoke, drink alcohol, or use illegal drugs. Some items may interact with your medicine. What should I watch for while using this medicine? Your condition will be monitored carefully while you are receiving this medicine. You will need important blood work done while you are taking this medicine. This drug may make you feel generally unwell. This is not uncommon, as chemotherapy can affect healthy cells as well as cancer cells. Report any side effects. Continue your course of treatment even though you feel ill unless your doctor tells you to stop. In some cases, you may be given additional medicines to help with side effects. Follow all directions for their use. You may get drowsy or dizzy. Do not drive, use machinery, or do anything that needs mental alertness until you know how this medicine affects you. Do not stand or sit up quickly, especially if you are an older patient. This reduces the risk of dizzy or fainting spells. Call your doctor or health care professional for advice if you get a fever, chills or sore throat, or other symptoms of a cold or flu. Do not treat yourself. This drug decreases your body's ability to fight infections. Try to avoid being around people who are sick. This medicine may increase your risk to bruise or bleed. Call your doctor or health care professional if you notice any unusual bleeding. Be careful brushing and flossing your teeth or using  a toothpick because you may get an infection or bleed more easily. If you have any dental work done, tell your dentist you are receiving this medicine. Avoid taking products that contain aspirin, acetaminophen, ibuprofen, naproxen, or ketoprofen unless instructed by your doctor. These medicines may hide a fever. Do not become pregnant while taking this medicine. Women should inform their doctor if they wish to become pregnant or think they might be pregnant. There is a potential for serious side effects to an unborn child. Talk to your health care professional or pharmacist for more information. Do not breast-feed an infant while taking this medicine. What side effects may I notice from receiving this medicine? Side effects that you should report to your doctor or health care professional as soon as possible: -allergic reactions like skin rash, itching or hives, swelling of the face, lips, or tongue -low blood counts - this medicine may decrease the number of white blood cells, red blood cells and platelets. You may be at increased risk for infections and bleeding. -signs of infection - fever or chills, cough, sore throat, pain or difficulty passing urine -signs of decreased platelets or bleeding - bruising, pinpoint red spots on the skin, black, tarry stools, blood in the urine -signs of decreased red blood cells - unusually weak or tired, fainting spells,  lightheadedness -breathing problems -chest pain -diarrhea -feeling faint or lightheaded, falls -flushing, runny nose, sweating during infusion -mouth sores or pain -pain, swelling, redness or irritation where injected -pain, swelling, warmth in the leg -pain, tingling, numbness in the hands or feet -problems with balance, talking, walking -stomach cramps, pain -trouble passing urine or change in the amount of urine -vomiting as to be unable to hold down drinks or food -yellowing of the eyes or skin Side effects that usually do not require  medical attention (report to your doctor or health care professional if they continue or are bothersome): -constipation -hair loss -headache -loss of appetite -nausea, vomiting -stomach upset This list may not describe all possible side effects. Call your doctor for medical advice about side effects. You may report side effects to FDA at 1-800-FDA-1088. Where should I keep my medicine? This drug is given in a hospital or clinic and will not be stored at home. NOTE: This sheet is a summary. It may not cover all possible information. If you have questions about this medicine, talk to your doctor, pharmacist, or health care provider.  2018 Elsevier/Gold Standard (2013-03-20 16:29:32)  Oxaliplatin Injection What is this medicine? OXALIPLATIN (ox AL i PLA tin) is a chemotherapy drug. It targets fast dividing cells, like cancer cells, and causes these cells to die. This medicine is used to treat cancers of the colon and rectum, and many other cancers. This medicine may be used for other purposes; ask your health care provider or pharmacist if you have questions. COMMON BRAND NAME(S): Eloxatin What should I tell my health care provider before I take this medicine? They need to know if you have any of these conditions: -kidney disease -an unusual or allergic reaction to oxaliplatin, other chemotherapy, other medicines, foods, dyes, or preservatives -pregnant or trying to get pregnant -breast-feeding How should I use this medicine? This drug is given as an infusion into a vein. It is administered in a hospital or clinic by a specially trained health care professional. Talk to your pediatrician regarding the use of this medicine in children. Special care may be needed. Overdosage: If you think you have taken too much of this medicine contact a poison control center or emergency room at once. NOTE: This medicine is only for you. Do not share this medicine with others. What if I miss a dose? It is  important not to miss a dose. Call your doctor or health care professional if you are unable to keep an appointment. What may interact with this medicine? -medicines to increase blood counts like filgrastim, pegfilgrastim, sargramostim -probenecid -some antibiotics like amikacin, gentamicin, neomycin, polymyxin B, streptomycin, tobramycin -zalcitabine Talk to your doctor or health care professional before taking any of these medicines: -acetaminophen -aspirin -ibuprofen -ketoprofen -naproxen This list may not describe all possible interactions. Give your health care provider a list of all the medicines, herbs, non-prescription drugs, or dietary supplements you use. Also tell them if you smoke, drink alcohol, or use illegal drugs. Some items may interact with your medicine. What should I watch for while using this medicine? Your condition will be monitored carefully while you are receiving this medicine. You will need important blood work done while you are taking this medicine. This medicine can make you more sensitive to cold. Do not drink cold drinks or use ice. Cover exposed skin before coming in contact with cold temperatures or cold objects. When out in cold weather wear warm clothing and cover your mouth and nose to warm  the air that goes into your lungs. Tell your doctor if you get sensitive to the cold. This drug may make you feel generally unwell. This is not uncommon, as chemotherapy can affect healthy cells as well as cancer cells. Report any side effects. Continue your course of treatment even though you feel ill unless your doctor tells you to stop. In some cases, you may be given additional medicines to help with side effects. Follow all directions for their use. Call your doctor or health care professional for advice if you get a fever, chills or sore throat, or other symptoms of a cold or flu. Do not treat yourself. This drug decreases your body's ability to fight infections. Try to  avoid being around people who are sick. This medicine may increase your risk to bruise or bleed. Call your doctor or health care professional if you notice any unusual bleeding. Be careful brushing and flossing your teeth or using a toothpick because you may get an infection or bleed more easily. If you have any dental work done, tell your dentist you are receiving this medicine. Avoid taking products that contain aspirin, acetaminophen, ibuprofen, naproxen, or ketoprofen unless instructed by your doctor. These medicines may hide a fever. Do not become pregnant while taking this medicine. Women should inform their doctor if they wish to become pregnant or think they might be pregnant. There is a potential for serious side effects to an unborn child. Talk to your health care professional or pharmacist for more information. Do not breast-feed an infant while taking this medicine. Call your doctor or health care professional if you get diarrhea. Do not treat yourself. What side effects may I notice from receiving this medicine? Side effects that you should report to your doctor or health care professional as soon as possible: -allergic reactions like skin rash, itching or hives, swelling of the face, lips, or tongue -low blood counts - This drug may decrease the number of white blood cells, red blood cells and platelets. You may be at increased risk for infections and bleeding. -signs of infection - fever or chills, cough, sore throat, pain or difficulty passing urine -signs of decreased platelets or bleeding - bruising, pinpoint red spots on the skin, black, tarry stools, nosebleeds -signs of decreased red blood cells - unusually weak or tired, fainting spells, lightheadedness -breathing problems -chest pain, pressure -cough -diarrhea -jaw tightness -mouth sores -nausea and vomiting -pain, swelling, redness or irritation at the injection site -pain, tingling, numbness in the hands or  feet -problems with balance, talking, walking -redness, blistering, peeling or loosening of the skin, including inside the mouth -trouble passing urine or change in the amount of urine Side effects that usually do not require medical attention (report to your doctor or health care professional if they continue or are bothersome): -changes in vision -constipation -hair loss -loss of appetite -metallic taste in the mouth or changes in taste -stomach pain This list may not describe all possible side effects. Call your doctor for medical advice about side effects. You may report side effects to FDA at 1-800-FDA-1088. Where should I keep my medicine? This drug is given in a hospital or clinic and will not be stored at home. NOTE: This sheet is a summary. It may not cover all possible information. If you have questions about this medicine, talk to your doctor, pharmacist, or health care provider.  2018 Elsevier/Gold Standard (2008-04-17 17:22:47)  Leucovorin injection What is this medicine? LEUCOVORIN (loo koe VOR in) is  used to prevent or treat the harmful effects of some medicines. This medicine is used to treat anemia caused by a low amount of folic acid in the body. It is also used with 5-fluorouracil (5-FU) to treat colon cancer. This medicine may be used for other purposes; ask your health care provider or pharmacist if you have questions. What should I tell my health care provider before I take this medicine? They need to know if you have any of these conditions: -anemia from low levels of vitamin B-12 in the blood -an unusual or allergic reaction to leucovorin, folic acid, other medicines, foods, dyes, or preservatives -pregnant or trying to get pregnant -breast-feeding How should I use this medicine? This medicine is for injection into a muscle or into a vein. It is given by a health care professional in a hospital or clinic setting. Talk to your pediatrician regarding the use of  this medicine in children. Special care may be needed. Overdosage: If you think you have taken too much of this medicine contact a poison control center or emergency room at once. NOTE: This medicine is only for you. Do not share this medicine with others. What if I miss a dose? This does not apply. What may interact with this medicine? -capecitabine -fluorouracil -phenobarbital -phenytoin -primidone -trimethoprim-sulfamethoxazole This list may not describe all possible interactions. Give your health care provider a list of all the medicines, herbs, non-prescription drugs, or dietary supplements you use. Also tell them if you smoke, drink alcohol, or use illegal drugs. Some items may interact with your medicine. What should I watch for while using this medicine? Your condition will be monitored carefully while you are receiving this medicine. This medicine may increase the side effects of 5-fluorouracil, 5-FU. Tell your doctor or health care professional if you have diarrhea or mouth sores that do not get better or that get worse. What side effects may I notice from receiving this medicine? Side effects that you should report to your doctor or health care professional as soon as possible: -allergic reactions like skin rash, itching or hives, swelling of the face, lips, or tongue -breathing problems -fever, infection -mouth sores -unusual bleeding or bruising -unusually weak or tired Side effects that usually do not require medical attention (report to your doctor or health care professional if they continue or are bothersome): -constipation or diarrhea -loss of appetite -nausea, vomiting This list may not describe all possible side effects. Call your doctor for medical advice about side effects. You may report side effects to FDA at 1-800-FDA-1088. Where should I keep my medicine? This drug is given in a hospital or clinic and will not be stored at home. NOTE: This sheet is a summary.  It may not cover all possible information. If you have questions about this medicine, talk to your doctor, pharmacist, or health care provider.  2018 Elsevier/Gold Standard (2008-03-27 16:50:29)  Fluorouracil, 5-FU injection What is this medicine? FLUOROURACIL, 5-FU (flure oh YOOR a sil) is a chemotherapy drug. It slows the growth of cancer cells. This medicine is used to treat many types of cancer like breast cancer, colon or rectal cancer, pancreatic cancer, and stomach cancer. This medicine may be used for other purposes; ask your health care provider or pharmacist if you have questions. COMMON BRAND NAME(S): Adrucil What should I tell my health care provider before I take this medicine? They need to know if you have any of these conditions: -blood disorders -dihydropyrimidine dehydrogenase (DPD) deficiency -infection (especially a virus  infection such as chickenpox, cold sores, or herpes) -kidney disease -liver disease -malnourished, poor nutrition -recent or ongoing radiation therapy -an unusual or allergic reaction to fluorouracil, other chemotherapy, other medicines, foods, dyes, or preservatives -pregnant or trying to get pregnant -breast-feeding How should I use this medicine? This drug is given as an infusion or injection into a vein. It is administered in a hospital or clinic by a specially trained health care professional. Talk to your pediatrician regarding the use of this medicine in children. Special care may be needed. Overdosage: If you think you have taken too much of this medicine contact a poison control center or emergency room at once. NOTE: This medicine is only for you. Do not share this medicine with others. What if I miss a dose? It is important not to miss your dose. Call your doctor or health care professional if you are unable to keep an appointment. What may interact with this  medicine? -allopurinol -cimetidine -dapsone -digoxin -hydroxyurea -leucovorin -levamisole -medicines for seizures like ethotoin, fosphenytoin, phenytoin -medicines to increase blood counts like filgrastim, pegfilgrastim, sargramostim -medicines that treat or prevent blood clots like warfarin, enoxaparin, and dalteparin -methotrexate -metronidazole -pyrimethamine -some other chemotherapy drugs like busulfan, cisplatin, estramustine, vinblastine -trimethoprim -trimetrexate -vaccines Talk to your doctor or health care professional before taking any of these medicines: -acetaminophen -aspirin -ibuprofen -ketoprofen -naproxen This list may not describe all possible interactions. Give your health care provider a list of all the medicines, herbs, non-prescription drugs, or dietary supplements you use. Also tell them if you smoke, drink alcohol, or use illegal drugs. Some items may interact with your medicine. What should I watch for while using this medicine? Visit your doctor for checks on your progress. This drug may make you feel generally unwell. This is not uncommon, as chemotherapy can affect healthy cells as well as cancer cells. Report any side effects. Continue your course of treatment even though you feel ill unless your doctor tells you to stop. In some cases, you may be given additional medicines to help with side effects. Follow all directions for their use. Call your doctor or health care professional for advice if you get a fever, chills or sore throat, or other symptoms of a cold or flu. Do not treat yourself. This drug decreases your body's ability to fight infections. Try to avoid being around people who are sick. This medicine may increase your risk to bruise or bleed. Call your doctor or health care professional if you notice any unusual bleeding. Be careful brushing and flossing your teeth or using a toothpick because you may get an infection or bleed more easily. If you  have any dental work done, tell your dentist you are receiving this medicine. Avoid taking products that contain aspirin, acetaminophen, ibuprofen, naproxen, or ketoprofen unless instructed by your doctor. These medicines may hide a fever. Do not become pregnant while taking this medicine. Women should inform their doctor if they wish to become pregnant or think they might be pregnant. There is a potential for serious side effects to an unborn child. Talk to your health care professional or pharmacist for more information. Do not breast-feed an infant while taking this medicine. Men should inform their doctor if they wish to father a child. This medicine may lower sperm counts. Do not treat diarrhea with over the counter products. Contact your doctor if you have diarrhea that lasts more than 2 days or if it is severe and watery. This medicine can make you more  sensitive to the sun. Keep out of the sun. If you cannot avoid being in the sun, wear protective clothing and use sunscreen. Do not use sun lamps or tanning beds/booths. What side effects may I notice from receiving this medicine? Side effects that you should report to your doctor or health care professional as soon as possible: -allergic reactions like skin rash, itching or hives, swelling of the face, lips, or tongue -low blood counts - this medicine may decrease the number of white blood cells, red blood cells and platelets. You may be at increased risk for infections and bleeding. -signs of infection - fever or chills, cough, sore throat, pain or difficulty passing urine -signs of decreased platelets or bleeding - bruising, pinpoint red spots on the skin, black, tarry stools, blood in the urine -signs of decreased red blood cells - unusually weak or tired, fainting spells, lightheadedness -breathing problems -changes in vision -chest pain -mouth sores -nausea and vomiting -pain, swelling, redness at site where injected -pain, tingling,  numbness in the hands or feet -redness, swelling, or sores on hands or feet -stomach pain -unusual bleeding Side effects that usually do not require medical attention (report to your doctor or health care professional if they continue or are bothersome): -changes in finger or toe nails -diarrhea -dry or itchy skin -hair loss -headache -loss of appetite -sensitivity of eyes to the light -stomach upset -unusually teary eyes This list may not describe all possible side effects. Call your doctor for medical advice about side effects. You may report side effects to FDA at 1-800-FDA-1088. Where should I keep my medicine? This drug is given in a hospital or clinic and will not be stored at home. NOTE: This sheet is a summary. It may not cover all possible information. If you have questions about this medicine, talk to your doctor, pharmacist, or health care provider.  2018 Elsevier/Gold Standard (2008-01-25 13:53:16)

## 2018-09-08 ENCOUNTER — Other Ambulatory Visit: Payer: BLUE CROSS/BLUE SHIELD

## 2018-09-08 ENCOUNTER — Encounter: Payer: BLUE CROSS/BLUE SHIELD | Admitting: Nutrition

## 2018-09-08 ENCOUNTER — Ambulatory Visit: Payer: BLUE CROSS/BLUE SHIELD

## 2018-09-08 ENCOUNTER — Telehealth: Payer: Self-pay | Admitting: Hematology

## 2018-09-08 ENCOUNTER — Ambulatory Visit: Payer: BLUE CROSS/BLUE SHIELD | Admitting: Hematology

## 2018-09-08 NOTE — Telephone Encounter (Signed)
Scheduled fluids for 12/6 per 12/4 sch message - pt is aware of appt date and time

## 2018-09-09 ENCOUNTER — Inpatient Hospital Stay: Payer: BLUE CROSS/BLUE SHIELD

## 2018-09-09 VITALS — BP 152/89 | HR 59 | Temp 97.8°F | Resp 18

## 2018-09-09 DIAGNOSIS — Z95828 Presence of other vascular implants and grafts: Secondary | ICD-10-CM

## 2018-09-09 DIAGNOSIS — C20 Malignant neoplasm of rectum: Secondary | ICD-10-CM

## 2018-09-09 DIAGNOSIS — C787 Secondary malignant neoplasm of liver and intrahepatic bile duct: Principal | ICD-10-CM

## 2018-09-09 DIAGNOSIS — Z7189 Other specified counseling: Secondary | ICD-10-CM

## 2018-09-09 DIAGNOSIS — Z5112 Encounter for antineoplastic immunotherapy: Secondary | ICD-10-CM | POA: Diagnosis not present

## 2018-09-09 MED ORDER — PEGFILGRASTIM-CBQV 6 MG/0.6ML ~~LOC~~ SOSY
6.0000 mg | PREFILLED_SYRINGE | Freq: Once | SUBCUTANEOUS | Status: AC
  Administered 2018-09-09: 6 mg via SUBCUTANEOUS

## 2018-09-09 MED ORDER — SODIUM CHLORIDE 0.9% FLUSH
10.0000 mL | INTRAVENOUS | Status: DC | PRN
  Administered 2018-09-09: 10 mL
  Filled 2018-09-09: qty 10

## 2018-09-09 MED ORDER — SODIUM CHLORIDE 0.9 % IV SOLN
Freq: Once | INTRAVENOUS | Status: AC
  Administered 2018-09-09: 15:00:00 via INTRAVENOUS
  Filled 2018-09-09: qty 250

## 2018-09-09 MED ORDER — HEPARIN SOD (PORK) LOCK FLUSH 100 UNIT/ML IV SOLN
500.0000 [IU] | Freq: Once | INTRAVENOUS | Status: AC | PRN
  Administered 2018-09-09: 500 [IU]
  Filled 2018-09-09: qty 5

## 2018-09-09 MED ORDER — PEGFILGRASTIM-CBQV 6 MG/0.6ML ~~LOC~~ SOSY
PREFILLED_SYRINGE | SUBCUTANEOUS | Status: AC
  Filled 2018-09-09: qty 0.6

## 2018-09-09 NOTE — Patient Instructions (Signed)
Pegfilgrastim injection What is this medicine? PEGFILGRASTIM (PEG fil gra stim) is a long-acting granulocyte colony-stimulating factor that stimulates the growth of neutrophils, a type of white blood cell important in the body's fight against infection. It is used to reduce the incidence of fever and infection in patients with certain types of cancer who are receiving chemotherapy that affects the bone marrow, and to increase survival after being exposed to high doses of radiation. This medicine may be used for other purposes; ask your health care provider or pharmacist if you have questions. COMMON BRAND NAME(S): Neulasta, Udenyca What should I tell my health care provider before I take this medicine? They need to know if you have any of these conditions: -kidney disease -latex allergy -ongoing radiation therapy -sickle cell disease -skin reactions to acrylic adhesives (On-Body Injector only) -an unusual or allergic reaction to pegfilgrastim, filgrastim, other medicines, foods, dyes, or preservatives -pregnant or trying to get pregnant -breast-feeding How should I use this medicine? This medicine is for injection under the skin. If you get this medicine at home, you will be taught how to prepare and give the pre-filled syringe or how to use the On-body Injector. Refer to the patient Instructions for Use for detailed instructions. Use exactly as directed. Tell your healthcare provider immediately if you suspect that the On-body Injector may not have performed as intended or if you suspect the use of the On-body Injector resulted in a missed or partial dose. It is important that you put your used needles and syringes in a special sharps container. Do not put them in a trash can. If you do not have a sharps container, call your pharmacist or healthcare provider to get one. Talk to your pediatrician regarding the use of this medicine in children. While this drug may be prescribed for selected  conditions, precautions do apply. Overdosage: If you think you have taken too much of this medicine contact a poison control center or emergency room at once. NOTE: This medicine is only for you. Do not share this medicine with others. What if I miss a dose? It is important not to miss your dose. Call your doctor or health care professional if you miss your dose. If you miss a dose due to an On-body Injector failure or leakage, a new dose should be administered as soon as possible using a single prefilled syringe for manual use. What may interact with this medicine? Interactions have not been studied. Give your health care provider a list of all the medicines, herbs, non-prescription drugs, or dietary supplements you use. Also tell them if you smoke, drink alcohol, or use illegal drugs. Some items may interact with your medicine. This list may not describe all possible interactions. Give your health care provider a list of all the medicines, herbs, non-prescription drugs, or dietary supplements you use. Also tell them if you smoke, drink alcohol, or use illegal drugs. Some items may interact with your medicine. What should I watch for while using this medicine? You may need blood work done while you are taking this medicine. If you are going to need a MRI, CT scan, or other procedure, tell your doctor that you are using this medicine (On-Body Injector only). What side effects may I notice from receiving this medicine? Side effects that you should report to your doctor or health care professional as soon as possible: -allergic reactions like skin rash, itching or hives, swelling of the face, lips, or tongue -dizziness -fever -pain, redness, or irritation at  site where injected -pinpoint red spots on the skin -red or dark-brown urine -shortness of breath or breathing problems -stomach or side pain, or pain at the shoulder -swelling -tiredness -trouble passing urine or change in the amount of  urine Side effects that usually do not require medical attention (report to your doctor or health care professional if they continue or are bothersome): -bone pain -muscle pain This list may not describe all possible side effects. Call your doctor for medical advice about side effects. You may report side effects to FDA at 1-800-FDA-1088. Where should I keep my medicine? Keep out of the reach of children. Store pre-filled syringes in a refrigerator between 2 and 8 degrees C (36 and 46 degrees F). Do not freeze. Keep in carton to protect from light. Throw away this medicine if it is left out of the refrigerator for more than 48 hours. Throw away any unused medicine after the expiration date. NOTE: This sheet is a summary. It may not cover all possible information. If you have questions about this medicine, talk to your doctor, pharmacist, or health care provider.  2018 Elsevier/Gold Standard (2016-09-17 12:58:03) Dehydration, Adult Dehydration is when there is not enough fluid or water in your body. This happens when you lose more fluids than you take in. Dehydration can range from mild to very bad. It should be treated right away to keep it from getting very bad. Symptoms of mild dehydration may include:  Thirst.  Dry lips.  Slightly dry mouth.  Dry, warm skin.  Dizziness. Symptoms of moderate dehydration may include:  Very dry mouth.  Muscle cramps.  Dark pee (urine). Pee may be the color of tea.  Your body making less pee.  Your eyes making fewer tears.  Heartbeat that is uneven or faster than normal (palpitations).  Headache.  Light-headedness, especially when you stand up from sitting.  Fainting (syncope). Symptoms of very bad dehydration may include:  Changes in skin, such as: ? Cold and clammy skin. ? Blotchy (mottled) or pale skin. ? Skin that does not quickly return to normal after being lightly pinched and let go (poor skin turgor).  Changes in body fluids,  such as: ? Feeling very thirsty. ? Your eyes making fewer tears. ? Not sweating when body temperature is high, such as in hot weather. ? Your body making very little pee.  Changes in vital signs, such as: ? Weak pulse. ? Pulse that is more than 100 beats a minute when you are sitting still. ? Fast breathing. ? Low blood pressure.  Other changes, such as: ? Sunken eyes. ? Cold hands and feet. ? Confusion. ? Lack of energy (lethargy). ? Trouble waking up from sleep. ? Short-term weight loss. ? Unconsciousness. Follow these instructions at home:  If told by your doctor, drink an ORS: ? Make an ORS by using instructions on the package. ? Start by drinking small amounts, about  cup (120 mL) every 5-10 minutes. ? Slowly drink more until you have had the amount that your doctor said to have.  Drink enough clear fluid to keep your pee clear or pale yellow. If you were told to drink an ORS, finish the ORS first, then start slowly drinking clear fluids. Drink fluids such as: ? Water. Do not drink only water by itself. Doing that can make the salt (sodium) level in your body get too low (hyponatremia). ? Ice chips. ? Fruit juice that you have added water to (diluted). ? Low-calorie sports drinks.  Avoid: ? Alcohol. ? Drinks that have a lot of sugar. These include high-calorie sports drinks, fruit juice that does not have water added, and soda. ? Caffeine. ? Foods that are greasy or have a lot of fat or sugar.  Take over-the-counter and prescription medicines only as told by your doctor.  Do not take salt tablets. Doing that can make the salt level in your body get too high (hypernatremia).  Eat foods that have minerals (electrolytes). Examples include bananas, oranges, potatoes, tomatoes, and spinach.  Keep all follow-up visits as told by your doctor. This is important. Contact a doctor if:  You have belly (abdominal) pain that: ? Gets worse. ? Stays in one area  (localizes).  You have a rash.  You have a stiff neck.  You get angry or annoyed more easily than normal (irritability).  You are more sleepy than normal.  You have a harder time waking up than normal.  You feel: ? Weak. ? Dizzy. ? Very thirsty.  You have peed (urinated) only a small amount of very dark pee during 6-8 hours. Get help right away if:  You have symptoms of very bad dehydration.  You cannot drink fluids without throwing up (vomiting).  Your symptoms get worse with treatment.  You have a fever.  You have a very bad headache.  You are throwing up or having watery poop (diarrhea) and it: ? Gets worse. ? Does not go away.  You have blood or something green (bile) in your throw-up.  You have blood in your poop (stool). This may cause poop to look black and tarry.  You have not peed in 6-8 hours.  You pass out (faint).  Your heart rate when you are sitting still is more than 100 beats a minute.  You have trouble breathing. This information is not intended to replace advice given to you by your health care provider. Make sure you discuss any questions you have with your health care provider. Document Released: 07/18/2009 Document Revised: 04/10/2016 Document Reviewed: 11/15/2015 Elsevier Interactive Patient Education  2018 [employer].

## 2018-09-16 ENCOUNTER — Other Ambulatory Visit: Payer: Self-pay | Admitting: Hematology

## 2018-09-21 DIAGNOSIS — Z5112 Encounter for antineoplastic immunotherapy: Secondary | ICD-10-CM | POA: Diagnosis not present

## 2018-09-22 ENCOUNTER — Inpatient Hospital Stay: Payer: BLUE CROSS/BLUE SHIELD

## 2018-09-22 ENCOUNTER — Encounter: Payer: BLUE CROSS/BLUE SHIELD | Admitting: Nutrition

## 2018-09-22 ENCOUNTER — Inpatient Hospital Stay (HOSPITAL_BASED_OUTPATIENT_CLINIC_OR_DEPARTMENT_OTHER): Payer: BLUE CROSS/BLUE SHIELD | Admitting: Hematology

## 2018-09-22 ENCOUNTER — Encounter: Payer: Self-pay | Admitting: Hematology

## 2018-09-22 VITALS — BP 153/98 | HR 77 | Temp 97.9°F | Resp 20 | Wt 249.2 lb

## 2018-09-22 DIAGNOSIS — C20 Malignant neoplasm of rectum: Secondary | ICD-10-CM | POA: Diagnosis not present

## 2018-09-22 DIAGNOSIS — K5900 Constipation, unspecified: Secondary | ICD-10-CM | POA: Diagnosis not present

## 2018-09-22 DIAGNOSIS — C787 Secondary malignant neoplasm of liver and intrahepatic bile duct: Principal | ICD-10-CM

## 2018-09-22 DIAGNOSIS — D6481 Anemia due to antineoplastic chemotherapy: Secondary | ICD-10-CM | POA: Diagnosis not present

## 2018-09-22 DIAGNOSIS — Z95828 Presence of other vascular implants and grafts: Secondary | ICD-10-CM

## 2018-09-22 DIAGNOSIS — Z5112 Encounter for antineoplastic immunotherapy: Secondary | ICD-10-CM | POA: Diagnosis not present

## 2018-09-22 DIAGNOSIS — I10 Essential (primary) hypertension: Secondary | ICD-10-CM

## 2018-09-22 DIAGNOSIS — Z7189 Other specified counseling: Secondary | ICD-10-CM

## 2018-09-22 DIAGNOSIS — D500 Iron deficiency anemia secondary to blood loss (chronic): Secondary | ICD-10-CM

## 2018-09-22 LAB — CBC WITH DIFFERENTIAL (CANCER CENTER ONLY)
Abs Immature Granulocytes: 0.06 10*3/uL (ref 0.00–0.07)
Basophils Absolute: 0 10*3/uL (ref 0.0–0.1)
Basophils Relative: 0 %
Eosinophils Absolute: 0.4 10*3/uL (ref 0.0–0.5)
Eosinophils Relative: 6 %
HEMATOCRIT: 29.3 % — AB (ref 39.0–52.0)
Hemoglobin: 9.4 g/dL — ABNORMAL LOW (ref 13.0–17.0)
IMMATURE GRANULOCYTES: 1 %
Lymphocytes Relative: 28 %
Lymphs Abs: 1.8 10*3/uL (ref 0.7–4.0)
MCH: 30.4 pg (ref 26.0–34.0)
MCHC: 32.1 g/dL (ref 30.0–36.0)
MCV: 94.8 fL (ref 80.0–100.0)
Monocytes Absolute: 0.6 10*3/uL (ref 0.1–1.0)
Monocytes Relative: 9 %
Neutro Abs: 3.8 10*3/uL (ref 1.7–7.7)
Neutrophils Relative %: 56 %
Platelet Count: 166 10*3/uL (ref 150–400)
RBC: 3.09 MIL/uL — ABNORMAL LOW (ref 4.22–5.81)
RDW: 16.7 % — AB (ref 11.5–15.5)
WBC Count: 6.7 10*3/uL (ref 4.0–10.5)
nRBC: 0 % (ref 0.0–0.2)

## 2018-09-22 LAB — CMP (CANCER CENTER ONLY)
ALK PHOS: 141 U/L — AB (ref 38–126)
ALT: 17 U/L (ref 0–44)
ANION GAP: 9 (ref 5–15)
AST: 19 U/L (ref 15–41)
Albumin: 3.1 g/dL — ABNORMAL LOW (ref 3.5–5.0)
BUN: 16 mg/dL (ref 8–23)
CO2: 23 mmol/L (ref 22–32)
Calcium: 8.5 mg/dL — ABNORMAL LOW (ref 8.9–10.3)
Chloride: 109 mmol/L (ref 98–111)
Creatinine: 0.79 mg/dL (ref 0.61–1.24)
GFR, Est AFR Am: >60 mL/min (ref >60)
GFR, Estimated: >60 mL/min (ref >60)
GLUCOSE: 109 mg/dL — AB (ref 70–99)
Potassium: 3.7 mmol/L (ref 3.5–5.1)
SODIUM: 141 mmol/L (ref 135–145)
Total Bilirubin: 0.2 mg/dL — ABNORMAL LOW (ref 0.3–1.2)
Total Protein: 6.7 g/dL (ref 6.5–8.1)

## 2018-09-22 LAB — CEA (IN HOUSE-CHCC): CEA (CHCC-In House): 137.33 ng/mL — ABNORMAL HIGH (ref 0.00–5.00)

## 2018-09-22 LAB — TOTAL PROTEIN, URINE DIPSTICK: Protein, ur: NEGATIVE mg/dL

## 2018-09-22 LAB — FERRITIN: Ferritin: 100 ng/mL (ref 24–336)

## 2018-09-22 MED ORDER — SODIUM CHLORIDE 0.9 % IV SOLN
5.3000 mg/kg | Freq: Once | INTRAVENOUS | Status: AC
  Administered 2018-09-22: 600 mg via INTRAVENOUS
  Filled 2018-09-22: qty 16

## 2018-09-22 MED ORDER — ATROPINE SULFATE 1 MG/ML IJ SOLN
INTRAMUSCULAR | Status: AC
  Filled 2018-09-22: qty 1

## 2018-09-22 MED ORDER — PALONOSETRON HCL INJECTION 0.25 MG/5ML
0.2500 mg | Freq: Once | INTRAVENOUS | Status: AC
  Administered 2018-09-22: 0.25 mg via INTRAVENOUS

## 2018-09-22 MED ORDER — OXALIPLATIN CHEMO INJECTION 100 MG/20ML
84.0000 mg/m2 | Freq: Once | INTRAVENOUS | Status: AC
  Administered 2018-09-22: 200 mg via INTRAVENOUS
  Filled 2018-09-22: qty 40

## 2018-09-22 MED ORDER — SODIUM CHLORIDE 0.9 % IV SOLN
Freq: Once | INTRAVENOUS | Status: AC
  Administered 2018-09-22: 13:00:00 via INTRAVENOUS
  Filled 2018-09-22: qty 250

## 2018-09-22 MED ORDER — SODIUM CHLORIDE 0.9 % IV SOLN
3000.0000 mg/m2 | INTRAVENOUS | Status: DC
  Administered 2018-09-22: 7250 mg via INTRAVENOUS
  Filled 2018-09-22: qty 145

## 2018-09-22 MED ORDER — LEUCOVORIN CALCIUM INJECTION 350 MG
200.0000 mg/m2 | Freq: Once | INTRAVENOUS | Status: AC
  Administered 2018-09-22: 482 mg via INTRAVENOUS
  Filled 2018-09-22: qty 24.1

## 2018-09-22 MED ORDER — DEXTROSE 5 % IV SOLN
Freq: Once | INTRAVENOUS | Status: AC
  Administered 2018-09-22: 12:00:00 via INTRAVENOUS
  Filled 2018-09-22: qty 250

## 2018-09-22 MED ORDER — DEXAMETHASONE SODIUM PHOSPHATE 10 MG/ML IJ SOLN
INTRAMUSCULAR | Status: AC
  Filled 2018-09-22: qty 1

## 2018-09-22 MED ORDER — DEXTROSE 5 % IV SOLN
Freq: Once | INTRAVENOUS | Status: AC
  Administered 2018-09-22: 13:00:00 via INTRAVENOUS
  Filled 2018-09-22: qty 250

## 2018-09-22 MED ORDER — IRINOTECAN HCL CHEMO INJECTION 100 MG/5ML
165.0000 mg/m2 | Freq: Once | INTRAVENOUS | Status: AC
  Administered 2018-09-22: 400 mg via INTRAVENOUS
  Filled 2018-09-22: qty 5

## 2018-09-22 MED ORDER — ATROPINE SULFATE 1 MG/ML IJ SOLN
0.5000 mg | Freq: Once | INTRAMUSCULAR | Status: AC | PRN
  Administered 2018-09-22: 0.5 mg via INTRAVENOUS

## 2018-09-22 MED ORDER — SODIUM CHLORIDE 0.9% FLUSH
10.0000 mL | INTRAVENOUS | Status: DC | PRN
  Administered 2018-09-22: 10 mL
  Filled 2018-09-22: qty 10

## 2018-09-22 MED ORDER — PALONOSETRON HCL INJECTION 0.25 MG/5ML
INTRAVENOUS | Status: AC
  Filled 2018-09-22: qty 5

## 2018-09-22 MED ORDER — DEXAMETHASONE SODIUM PHOSPHATE 10 MG/ML IJ SOLN
10.0000 mg | Freq: Once | INTRAMUSCULAR | Status: AC
  Administered 2018-09-22: 10 mg via INTRAVENOUS

## 2018-09-22 NOTE — Progress Notes (Addendum)
[facility]   Telephone:(336) [phone] Fax:(336) [phone]   Clinic Follow up Note   Patient Care Team: Billy Proud, MD as PCP - General (Internal Medicine) Billy Merle, MD as Consulting Physician (Hematology) Billy Ada, MD as Consulting Physician (Gastroenterology)  Date of Service:  09/22/2018  CHIEF COMPLAINT: F/u of rectal cancer  SUMMARY OF ONCOLOGIC HISTORY: Oncology History   Cancer Staging Rectal cancer metastasized to liver [facility]) Staging form: Colon and Rectum, AJCC 8th Edition - Clinical stage from 07/28/2018: Stage IVB (cTX, cNX, pM1b) - Signed by Billy Merle, MD on 08/18/2018       Rectal cancer metastasized to liver ([place])   07/28/2018 Initial Biopsy    Biopsy 07/28/18 Final Microscopic Diagnosis A.Colon, Ascending, Polyp:  -Fragments of tubular adenoma -No high-grade dysplasia of malignancy.  B. Rectum, Mass, Polyp:  -Invasive adenocarcinoma, moderately - poorly differentiatred.     07/28/2018 Cancer Staging    Staging form: Colon and Rectum, AJCC 8th Edition - Clinical stage from 07/28/2018: Stage IVB (cTX, cNX, pM1b) - Signed by Billy Merle, MD on 08/18/2018    08/04/2018 Initial Diagnosis    Rectal cancer metastasized to liver ([place])    08/16/2018 Pathology Results    Biospy  Diagnosis 08/16/18 Liver, needle/core biopsy - ADENOCARCINOMA. Microscopic Comment By morphology, the findings are compatible with the provided clinical history of colorectal adenocarcinoma. Results reported to Dr. Truitt Mendoza on 08/18/2018. Intradepartmental consultation was obtained (Billy Mendoza).     08/18/2018 Procedure    Colonoscopy by Dr. Benson Mendoza showed malignant appearing partially obstructing tumor in the rectum, 8 cm above anal verge.    08/18/2018 - 09/07/2018 Chemotherapy    CAPOX q3weeks with Xeloda 2063m BID 2 weeks on, 1 weeks off starting 08/18/18.  Swithced treatment due to BRAF mutation.     09/07/2018 -  Chemotherapy    FOLFOXIRI and avastin  every 2 weeks starting 09/07/18      CURRENT THERAPY:  FOLFOXIRI and avastin every 2 weeks starting 09/07/18  INTERVAL HISTORY:  Billy Mendoza here for a follow up and treatment. He presents to the infusion room with his wife. He overall feels great today. He notes last cycle went well with being down around day 3-5 with fatigue and little activity. He can fix lunch and get the trash out if he has to. He returned to work 6 days ago and was able to be sufficient. He had diarrhea for a few hours for which he took imodium for with relief.  He notes indigestion last night, but now resolved. He thinks he gained a few pounds. His wife notes his voice was hoarse with a cough the last few days. She notes this cough has been present for a few months and never completely goes away. He denies having acid reflux. His wife is concerned about his chest change. He notes one episode of chest discomfort, but notes he is able to still take stairs. He notes throat tightness after oxaliplatin infusion which lasts for a few moments. He will contact our sEducation officer, museumabout help with FMLA.      REVIEW OF SYSTEMS:   Constitutional: Denies fevers, chills or abnormal weight loss (+) weight gain (+) fatigue  Eyes: Denies blurriness of vision Ears, nose, mouth, throat, and face: Denies mucositis or sore throat Respiratory: Denies dyspnea or wheezes (+) recurrent cough (+) hoarse voice. (+) 1 episode of chest discomfort  Cardiovascular: Denies palpitation, chest discomfort or lower extremity swelling Gastrointestinal:  Denies nausea, heartburn or  change in bowel habits Skin: Denies abnormal skin rashes Lymphatics: Denies new lymphadenopathy or easy bruising Neurological:Denies numbness, tingling or new weaknesses Behavioral/Psych: Mood is stable, no new changes  All other systems were reviewed with the patient and are negative.  MEDICAL HISTORY:  No past medical history on file.  SURGICAL HISTORY: Past Surgical  History:  Procedure Laterality Date  . IR IMAGING GUIDED PORT INSERTION  08/16/2018  . IR US GUIDE BX ASP/DRAIN  08/16/2018    I have reviewed the social history and family history with the patient and they are unchanged from previous note.  ALLERGIES:  has No Known Allergies.  MEDICATIONS:  Current Outpatient Medications  Medication Sig Dispense Refill  . acetaminophen (TYLENOL) 325 MG tablet Take 650 mg by mouth every 6 (six) hours as needed.    Marland Kitchen acetaminophen-codeine (TYLENOL #3) 300-30 MG tablet Take 1 tablet by mouth every 4 (four) hours as needed for moderate pain. (Patient not taking: Reported on 08/30/2018) 20 tablet 0  . diphenoxylate-atropine (LOMOTIL) 2.5-0.025 MG tablet Take 1-2 tablets by mouth 4 (four) times daily as needed for diarrhea or loose stools. Up to 8 tabs daily for severe diarrhea 60 tablet 2  . guaifenesin (ROBITUSSIN) 100 MG/5ML syrup Take 200 mg by mouth 3 (three) times daily as needed for cough.    Marland Kitchen ibuprofen (ADVIL,MOTRIN) 200 MG tablet Take 200 mg by mouth every 6 (six) hours as needed.    . polyethylene glycol (MIRALAX / GLYCOLAX) packet Take 17 g by mouth daily.     No current facility-administered medications for this visit.     PHYSICAL EXAMINATION: ECOG PERFORMANCE STATUS: 1 - Symptomatic but completely ambulatory Blood pressure 153/98, heart rate 77, respiratory 20, temperature 36.6, pulse ox 100% on room air GENERAL:alert, no distress and comfortable SKIN: skin color, texture, turgor are normal, no rashes or significant lesions EYES: normal, Conjunctiva are pink and non-injected, sclera clear OROPHARYNX:no exudate, no erythema and lips, buccal mucosa, and tongue normal  NECK: supple, thyroid normal size, non-tender, without nodularity LYMPH:  no palpable lymphadenopathy in the cervical, axillary or inguinal LUNGS: clear to auscultation and percussion with normal breathing effort (+) mild crackling of lungs HEART: regular rate & rhythm and no  murmurs and no lower extremity edema ABDOMEN:abdomen soft, non-tender and normal bowel sounds Musculoskeletal:no cyanosis of digits and no clubbing  NEURO: alert & oriented x 3 with fluent speech, no focal motor/sensory deficits  LABORATORY DATA:  I have reviewed the data as listed CBC Latest Ref Rng & Units 09/21/2018 09/06/2018 08/26/2018  WBC 4.0 - 10.5 K/uL 6.7 7.1 10.8(H)  Hemoglobin 13.0 - 17.0 g/dL 9.4(L) 9.5(L) 9.1(L)  Hematocrit 39.0 - 52.0 % 29.3(L) 30.2(L) 28.7(L)  Platelets 150 - 400 K/uL 166 263 230     CMP Latest Ref Rng & Units 09/21/2018 09/06/2018 08/26/2018  Glucose 70 - 99 mg/dL 109(H) 95 145(H)  BUN 8 - 23 mg/dL 16 18 17   Creatinine 0.61 - 1.24 mg/dL 0.79 1.04 0.84  Sodium 135 - 145 mmol/L 141 142 142  Potassium 3.5 - 5.1 mmol/L 3.7 4.3 3.7  Chloride 98 - 111 mmol/L 109 111 109  CO2 22 - 32 mmol/L 23 24 21(L)  Calcium 8.9 - 10.3 mg/dL 8.5(L) 8.7(L) 8.6(L)  Total Protein 6.5 - 8.1 g/dL 6.7 7.0 6.7  Total Bilirubin 0.3 - 1.2 mg/dL 0.2(L) 0.3 0.2(L)  Alkaline Phos 38 - 126 U/L 141(H) 144(H) 206(H)  AST 15 - 41 U/L 19 23 25   ALT 0 -  44 U/L 17 17 19       RADIOGRAPHIC STUDIES: I have personally reviewed the radiological images as listed and agreed with the findings in the report. No results found.   ASSESSMENT & PLAN:  Billy Mendoza is a 67 y.o. male with history of    1. 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 He completed 1 cycle of CAPOX before changing to FOLFOXIRI and avastin due to BRAF mutation in his tumor.  -He is tolerating reduced dose FOLFOXIRI well with voice change and 1 episode of diarrhea, manageable, moderate fatigue for 4-5 days, recovered well. He will continue imodium use as needed.  -He had mild crackling at lung bases upon exam today, I suspect this is related to not taking full breaths. I instructed him to practice taking deep breathes,  remain active and continue adequate diet.  -Labs reviewed and adequate to proceed with treatment today with increased dose to see if he tolerates.  -If he is not eating adequately, taking in enough fluids or has severe diarrhea, I encouraged him to contact clinic for IV Fluids.  -Plan to rescan after cycle 4.  -I will set up meeting with social worker to go over financial support.  -F/u in 2 weeks   2. Rectal pain and bleeding, constipation  -He uses stool softeners and laxatives for his constipation. -He uses Tylenol for the pain -improved   3. Anemia secondary to cancer and chemotherapy - Labs reviewed, Hg stable at 9.4 today (09/22/18)  4. HTN -his BP has been high during his office visits, no previous history of hypertension, possible related to anxiety -I recommend patient to monitor at home.  5. Goal of care discussion -He is full code now -He understands his disease is incurable, and the goal of therapy is palliative to prolong his life.    Plan  -Labs reviewed and adequate to proceed with treatment today with increased dose of chemo.  -Lab, flush, f/u and FOLFOXIRI in 2 weeks    No problem-specific Assessment & Plan notes found for this encounter.   No orders of the defined types were placed in this encounter.  All questions were answered. The patient knows to call the clinic with any problems, questions or concerns. No barriers to learning was detected.  I spent 20 minutes counseling the patient face to face. The total time spent in the appointment was 25 minutes and more than 50% was on counseling and review of test results     Billy Merle, MD 09/22/2018   I, Joslyn Devon, am acting as scribe for Billy Merle, MD.   I have reviewed the above documentation for accuracy and completeness, and I agree with the above.

## 2018-09-22 NOTE — Progress Notes (Signed)
Per Dr. Burr Medico, [place] to increase pump rate to complete infusion at 1330 on Saturday. Patient's Pump rate increased to 5.7 mL/hr. Patient knows to arrive for pump D/C at 1315 on Saturday.

## 2018-09-22 NOTE — Patient Instructions (Addendum)
[facility] Discharge Instructions for Patients Receiving Chemotherapy  Today you received the following chemotherapy agents Bevacizumab (AVASTIN), Irinotecan (CAMPTOSAR), Oxaliplatin (ELOXATIN), Leucovorin & Flourouracil (ADRUCIL).  To help prevent nausea and vomiting after your treatment, we encourage you to take your nausea medication as prescribed.   If you develop nausea and vomiting that is not controlled by your nausea medication, call the clinic.   BELOW ARE SYMPTOMS THAT SHOULD BE REPORTED IMMEDIATELY:  *FEVER GREATER THAN 100.5 F  *CHILLS WITH OR WITHOUT FEVER  NAUSEA AND VOMITING THAT IS NOT CONTROLLED WITH YOUR NAUSEA MEDICATION  *UNUSUAL SHORTNESS OF BREATH  *UNUSUAL BRUISING OR BLEEDING  TENDERNESS IN MOUTH AND THROAT WITH OR WITHOUT PRESENCE OF ULCERS  *URINARY PROBLEMS  *BOWEL PROBLEMS  UNUSUAL RASH Items with * indicate a potential emergency and should be followed up as soon as possible.  Feel free to call the clinic should you have any questions or concerns. The clinic phone number is [phone].  Please show the [place] at check-in to the Emergency Department and triage nurse.

## 2018-09-23 ENCOUNTER — Telehealth: Payer: Self-pay | Admitting: Hematology

## 2018-09-23 NOTE — Telephone Encounter (Signed)
No los per 12/19.

## 2018-09-24 ENCOUNTER — Inpatient Hospital Stay: Payer: BLUE CROSS/BLUE SHIELD

## 2018-09-24 VITALS — BP 148/98 | HR 70 | Temp 98.0°F | Resp 16

## 2018-09-24 DIAGNOSIS — Z5112 Encounter for antineoplastic immunotherapy: Secondary | ICD-10-CM | POA: Diagnosis not present

## 2018-09-24 DIAGNOSIS — Z7189 Other specified counseling: Secondary | ICD-10-CM

## 2018-09-24 DIAGNOSIS — C20 Malignant neoplasm of rectum: Secondary | ICD-10-CM

## 2018-09-24 DIAGNOSIS — C787 Secondary malignant neoplasm of liver and intrahepatic bile duct: Principal | ICD-10-CM

## 2018-09-24 MED ORDER — SODIUM CHLORIDE 0.9% FLUSH
10.0000 mL | INTRAVENOUS | Status: DC | PRN
  Administered 2018-09-24: 10 mL
  Filled 2018-09-24: qty 10

## 2018-09-24 MED ORDER — HEPARIN SOD (PORK) LOCK FLUSH 100 UNIT/ML IV SOLN
500.0000 [IU] | Freq: Once | INTRAVENOUS | Status: AC | PRN
  Administered 2018-09-24: 500 [IU]
  Filled 2018-09-24: qty 5

## 2018-09-24 MED ORDER — TBO-FILGRASTIM 480 MCG/0.8ML ~~LOC~~ SOSY
480.0000 ug | PREFILLED_SYRINGE | Freq: Once | SUBCUTANEOUS | Status: AC
  Administered 2018-09-24: 480 ug via SUBCUTANEOUS

## 2018-09-24 MED ORDER — TBO-FILGRASTIM 480 MCG/0.8ML ~~LOC~~ SOSY
PREFILLED_SYRINGE | SUBCUTANEOUS | Status: AC
  Filled 2018-09-24: qty 0.8

## 2018-09-24 NOTE — Patient Instructions (Signed)
Tbo-Filgrastim injection What is this medicine? TBO-FILGRASTIM (T B O fil GRA stim) is a granulocyte colony-stimulating factor that stimulates the growth of neutrophils, a type of white blood cell important in the body's fight against infection. It is used to reduce the incidence of fever and infection in patients with certain types of cancer who are receiving chemotherapy that affects the bone marrow. This medicine may be used for other purposes; ask your health care provider or pharmacist if you have questions. COMMON BRAND NAME(S): Granix What should I tell my health care provider before I take this medicine? They need to know if you have any of these conditions: -bone scan or tests planned -kidney disease -sickle cell anemia -an unusual or allergic reaction to tbo-filgrastim, filgrastim, pegfilgrastim, other medicines, foods, dyes, or preservatives -pregnant or trying to get pregnant -breast-feeding How should I use this medicine? This medicine is for injection under the skin. If you get this medicine at home, you will be taught how to prepare and give this medicine. Refer to the Instructions for Use that come with your medication packaging. Use exactly as directed. Take your medicine at regular intervals. Do not take your medicine more often than directed. It is important that you put your used needles and syringes in a special sharps container. Do not put them in a trash can. If you do not have a sharps container, call your pharmacist or healthcare provider to get one. Talk to your pediatrician regarding the use of this medicine in children. While this drug may be prescribed for children as young as 1 month of age for selected conditions, precautions do apply. Overdosage: If you think you have taken too much of this medicine contact a poison control center or emergency room at once. NOTE: This medicine is only for you. Do not share this medicine with others. What if I miss a dose? It is  important not to miss your dose. Call your doctor or health care professional if you miss a dose. What may interact with this medicine? This medicine may interact with the following medications: -medicines that may cause a release of neutrophils, such as lithium This list may not describe all possible interactions. Give your health care provider a list of all the medicines, herbs, non-prescription drugs, or dietary supplements you use. Also tell them if you smoke, drink alcohol, or use illegal drugs. Some items may interact with your medicine. What should I watch for while using this medicine? You may need blood work done while you are taking this medicine. What side effects may I notice from receiving this medicine? Side effects that you should report to your doctor or health care professional as soon as possible: -allergic reactions like skin rash, itching or hives, swelling of the face, lips, or tongue -back pain -blood in the urine -dark urine -dizziness -fast heartbeat -feeling faint -shortness of breath or breathing problems -signs and symptoms of infection like fever or chills; cough; or sore throat -signs and symptoms of kidney injury like trouble passing urine or change in the amount of urine -stomach or side pain, or pain at the shoulder -sweating -swelling of the legs, ankles, or abdomen -tiredness Side effects that usually do not require medical attention (report to your doctor or health care professional if they continue or are bothersome): -bone pain -diarrhea -headache -muscle pain -vomiting This list may not describe all possible side effects. Call your doctor for medical advice about side effects. You may report side effects to FDA at   1-800-FDA-1088. Where should I keep my medicine? Keep out of the reach of children. Store in a refrigerator between 2 and 8 degrees C (36 and 46 degrees F). Keep in carton to protect from light. Throw away this medicine if it is left out  of the refrigerator for more than 5 consecutive days. Throw away any unused medicine after the expiration date. NOTE: This sheet is a summary. It may not cover all possible information. If you have questions about this medicine, talk to your doctor, pharmacist, or health care provider.  2019 Elsevier/Gold Standard (2017-05-11 16:56:18)  

## 2018-09-26 ENCOUNTER — Encounter: Payer: Self-pay | Admitting: Hematology

## 2018-09-26 ENCOUNTER — Encounter: Payer: Self-pay | Admitting: *Deleted

## 2018-09-26 NOTE — Progress Notes (Signed)
[place] Work  Holiday representative received referral from Futures trader for disability questions and concerns.  CSW contacted patient at home to offer support and assess for needs.  Patient stated he was currently working full time and feeling "well" enough to continue working.  Patient is 22 and receiving his Medicare benefit, but elected to wait until 68 to receive his social security benefit.  Patient stated he does have a disability benefit through his employer, but is not sure of the details.  CSW encouraged patient to contact his HR to determine the details of his benefits.  Patient plans to do so today.  Patient stated his primary frustration was the [facility] process.  CSW informed patient that [facility] could assist him with that process.  He just needs to collect the paperwork from his employer and bring to [facility].  CSW and patient discussed scheduling his chemotherapy treatment to best fit his work schedule, and using his FMLA and work disability benefits creatively to help his reach 67 years old next year when he can receive his full social security benefit.  CSW provided contact information and encouraged patient to call with any additional questions or concerns.    Johnnye Lana, MSW, LCSW, OSW-C  Clinical Social Worker [facility] [phone]

## 2018-09-26 NOTE — Progress Notes (Signed)
Called patient to introduce myself as Arboriculturist and to offer available resources. Patient has 3 insurance therefore copay assistance is not needed.  Discussed the one-time $700 Engineer, drilling. Patient states they are over the income for the grant guidelines quoted.   He advised me to email him with my contact information for any additional financial questions or concerns. This was done.

## 2018-10-06 ENCOUNTER — Encounter: Payer: Self-pay | Admitting: Adult Health

## 2018-10-06 ENCOUNTER — Inpatient Hospital Stay: Payer: BLUE CROSS/BLUE SHIELD

## 2018-10-06 ENCOUNTER — Inpatient Hospital Stay: Payer: BLUE CROSS/BLUE SHIELD | Attending: Adult Health | Admitting: Adult Health

## 2018-10-06 ENCOUNTER — Telehealth: Payer: Self-pay | Admitting: Adult Health

## 2018-10-06 VITALS — BP 172/108 | HR 73 | Temp 98.5°F | Resp 18 | Ht 73.0 in | Wt 249.4 lb

## 2018-10-06 DIAGNOSIS — C20 Malignant neoplasm of rectum: Secondary | ICD-10-CM | POA: Diagnosis present

## 2018-10-06 DIAGNOSIS — C787 Secondary malignant neoplasm of liver and intrahepatic bile duct: Principal | ICD-10-CM

## 2018-10-06 DIAGNOSIS — K5900 Constipation, unspecified: Secondary | ICD-10-CM | POA: Insufficient documentation

## 2018-10-06 DIAGNOSIS — D6481 Anemia due to antineoplastic chemotherapy: Secondary | ICD-10-CM | POA: Insufficient documentation

## 2018-10-06 DIAGNOSIS — Z5112 Encounter for antineoplastic immunotherapy: Secondary | ICD-10-CM | POA: Insufficient documentation

## 2018-10-06 DIAGNOSIS — I10 Essential (primary) hypertension: Secondary | ICD-10-CM | POA: Diagnosis not present

## 2018-10-06 DIAGNOSIS — Z5189 Encounter for other specified aftercare: Secondary | ICD-10-CM | POA: Insufficient documentation

## 2018-10-06 DIAGNOSIS — Z452 Encounter for adjustment and management of vascular access device: Secondary | ICD-10-CM | POA: Diagnosis not present

## 2018-10-06 DIAGNOSIS — I2699 Other pulmonary embolism without acute cor pulmonale: Secondary | ICD-10-CM | POA: Insufficient documentation

## 2018-10-06 DIAGNOSIS — D509 Iron deficiency anemia, unspecified: Secondary | ICD-10-CM | POA: Diagnosis not present

## 2018-10-06 DIAGNOSIS — G629 Polyneuropathy, unspecified: Secondary | ICD-10-CM | POA: Diagnosis not present

## 2018-10-06 DIAGNOSIS — Z5111 Encounter for antineoplastic chemotherapy: Secondary | ICD-10-CM | POA: Diagnosis present

## 2018-10-06 DIAGNOSIS — Z95828 Presence of other vascular implants and grafts: Secondary | ICD-10-CM

## 2018-10-06 LAB — CBC WITH DIFFERENTIAL (CANCER CENTER ONLY)
Abs Immature Granulocytes: 0.01 10*3/uL (ref 0.00–0.07)
Basophils Absolute: 0.1 10*3/uL (ref 0.0–0.1)
Basophils Relative: 1 %
Eosinophils Absolute: 0.2 10*3/uL (ref 0.0–0.5)
Eosinophils Relative: 5 %
HCT: 30 % — ABNORMAL LOW (ref 39.0–52.0)
Hemoglobin: 9.6 g/dL — ABNORMAL LOW (ref 13.0–17.0)
Immature Granulocytes: 0 %
Lymphocytes Relative: 41 %
Lymphs Abs: 1.7 10*3/uL (ref 0.7–4.0)
MCH: 30.3 pg (ref 26.0–34.0)
MCHC: 32 g/dL (ref 30.0–36.0)
MCV: 94.6 fL (ref 80.0–100.0)
Monocytes Absolute: 0.8 10*3/uL (ref 0.1–1.0)
Monocytes Relative: 19 %
Neutro Abs: 1.4 10*3/uL — ABNORMAL LOW (ref 1.7–7.7)
Neutrophils Relative %: 34 %
Platelet Count: 254 10*3/uL (ref 150–400)
RBC: 3.17 MIL/uL — AB (ref 4.22–5.81)
RDW: 18.1 % — ABNORMAL HIGH (ref 11.5–15.5)
WBC Count: 4.1 10*3/uL (ref 4.0–10.5)
nRBC: 0 % (ref 0.0–0.2)

## 2018-10-06 LAB — CMP (CANCER CENTER ONLY)
ALT: 20 U/L (ref 0–44)
AST: 22 U/L (ref 15–41)
Albumin: 3.3 g/dL — ABNORMAL LOW (ref 3.5–5.0)
Alkaline Phosphatase: 118 U/L (ref 38–126)
Anion gap: 9 (ref 5–15)
BUN: 15 mg/dL (ref 8–23)
CHLORIDE: 111 mmol/L (ref 98–111)
CO2: 21 mmol/L — ABNORMAL LOW (ref 22–32)
Calcium: 8.6 mg/dL — ABNORMAL LOW (ref 8.9–10.3)
Creatinine: 0.87 mg/dL (ref 0.61–1.24)
GFR, Est AFR Am: >60 mL/min (ref >60)
Glucose, Bld: 96 mg/dL (ref 70–99)
Potassium: 3.3 mmol/L — ABNORMAL LOW (ref 3.5–5.1)
Sodium: 141 mmol/L (ref 135–145)
Total Bilirubin: 0.3 mg/dL (ref 0.3–1.2)
Total Protein: 6.8 g/dL (ref 6.5–8.1)

## 2018-10-06 LAB — TOTAL PROTEIN, URINE DIPSTICK: Protein, ur: NEGATIVE mg/dL

## 2018-10-06 MED ORDER — HEPARIN SOD (PORK) LOCK FLUSH 100 UNIT/ML IV SOLN
500.0000 [IU] | Freq: Once | INTRAVENOUS | Status: AC | PRN
  Administered 2018-10-06: 500 [IU]
  Filled 2018-10-06: qty 5

## 2018-10-06 MED ORDER — SODIUM CHLORIDE 0.9% FLUSH
10.0000 mL | Freq: Once | INTRAVENOUS | Status: AC | PRN
  Administered 2018-10-06: 10 mL
  Filled 2018-10-06: qty 10

## 2018-10-06 MED ORDER — SODIUM CHLORIDE 0.9% FLUSH
3.0000 mL | Freq: Once | INTRAVENOUS | Status: DC | PRN
  Filled 2018-10-06: qty 10

## 2018-10-06 NOTE — Progress Notes (Signed)
[facility] Follow up:    Merry Proud, MD No address on file   DIAGNOSIS: Cancer Staging Rectal cancer metastasized to liver [facility]) Staging form: Colon and Rectum, AJCC 8th Edition - Clinical stage from 07/28/2018: Stage IVB (cTX, cNX, pM1b) - Signed by Truitt Merle, MD on 08/18/2018   SUMMARY OF ONCOLOGIC HISTORY: Oncology History   Cancer Staging Rectal cancer metastasized to liver [facility]) Staging form: Colon and Rectum, AJCC 8th Edition - Clinical stage from 07/28/2018: Stage IVB (cTX, cNX, pM1b) - Signed by Truitt Merle, MD on 08/18/2018       Rectal cancer metastasized to liver ([place])   07/28/2018 Initial Biopsy    Biopsy 07/28/18 Final Microscopic Diagnosis A.Colon, Ascending, Polyp:  -Fragments of tubular adenoma -No high-grade dysplasia of malignancy.  B. Rectum, Mass, Polyp:  -Invasive adenocarcinoma, moderately - poorly differentiatred.     07/28/2018 Cancer Staging    Staging form: Colon and Rectum, AJCC 8th Edition - Clinical stage from 07/28/2018: Stage IVB (cTX, cNX, pM1b) - Signed by Truitt Merle, MD on 08/18/2018    08/04/2018 Initial Diagnosis    Rectal cancer metastasized to liver ([place])    08/16/2018 Pathology Results    Biospy  Diagnosis 08/16/18 Liver, needle/core biopsy - ADENOCARCINOMA. Microscopic Comment By morphology, the findings are compatible with the provided clinical history of colorectal adenocarcinoma. Results reported to [name] on 08/18/2018. Intradepartmental consultation was obtained ([name]).     08/18/2018 Procedure    Colonoscopy by Dr. Benson Norway showed malignant appearing partially obstructing tumor in the rectum, 8 cm above anal verge.    08/18/2018 - 09/07/2018 Chemotherapy    CAPOX q3weeks with Xeloda 2064m BID 2 weeks on, 1 weeks off starting 08/18/18.  Swithced treatment due to BRAF mutation.     09/07/2018 -  Chemotherapy    FOLFOXIRI and avastin every 2 weeks starting 09/07/18     CURRENT  THERAPY: Folfoxiri and avastin every 2 weeks due 10/08/2018  INTERVAL HISTORY: Billy Saiz89y.[id number]. male returns for evaluation prior to receiving his chemotherapy with folfoxiri and avastin given every 2 weeks.  He is doing moderately well today.  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  He has occasional, mild, hardly noticeable peripheral neuropathy that is very intermittent. He denies any motor changes.  He says that he scratched a scab off of his arm accidentally and it bled more than usual.  He also noted that his hematochezia is improved after his treatments, along with his lower abdominal pain.     Patient Active Problem List   Diagnosis Date Noted  . Port-A-Cath in place 08/18/2018  . Iron deficiency anemia due to chronic blood loss 08/18/2018  . Rectal cancer metastasized to liver ([place] Mendo[place] 08/04/2018  . Goals of care, counseling/discussion 08/04/2018    has No Known Allergies.  MEDICAL HISTORY: History reviewed. No pertinent past medical history.  SURGICAL HISTORY: Past Surgical History:  Procedure Laterality Date  . IR IMAGING GUIDED PORT INSERTION  08/16/2018  . IR [place] BX ASP/DRAIN  08/16/2018    SOCIAL HISTORY: Social History   Socioeconomic History  . Marital status: Married    Spouse name: Not on file  . Number of children: Not on file  . Years of education: Not on file  . Highest education level: Not on  file  Occupational History  . Not on file  Social Needs  . Financial resource strain: Not on file  . Food insecurity:    Worry: Not on file    Inability: Not on file  . Transportation needs:    Medical: No    Non-medical: No  Tobacco Use  . Smoking status: Former Smoker    Years: 10.00    Types: Cigarettes, Cigars  . Smokeless tobacco: Never Used  . Tobacco comment: quit in Sep  2019   Substance and Sexual Activity  . Alcohol use: Yes    Alcohol/week: 1.0 - 2.0 standard drinks    Types: 1 - 2 Shots of liquor per week  . Drug use: Never  . Sexual activity: Not on file  Lifestyle  . Physical activity:    Days per week: Not on file    Minutes per session: Not on file  . Stress: Not on file  Relationships  . Social connections:    Talks on phone: Not on file    Gets together: Not on file    Attends religious service: Not on file    Active member of club or organization: Not on file    Attends meetings of clubs or organizations: Not on file    Relationship status: Not on file  . Intimate partner violence:    Fear of current or ex partner: Not on file    Emotionally abused: Not on file    Physically abused: Not on file    Forced sexual activity: Not on file  Other Topics Concern  . Not on file  Social History Narrative  . Not on file    FAMILY HISTORY: Family History  Problem Relation Age of Onset  . Cancer Mother 66       breast cancer  . Atrial fibrillation Father   . Heart failure Father   . Cancer Maternal Grandmother        lung cancer     Review of Systems  Constitutional: Positive for fatigue. Negative for appetite change, chills, fever and unexpected weight change.  HENT:   Negative for hearing loss, lump/mass and trouble swallowing.   Eyes: Negative for eye problems and icterus.  Respiratory: Negative for chest tightness, cough and shortness of breath.   Cardiovascular: Negative for chest pain, leg swelling and palpitations.  Gastrointestinal: Positive for blood in stool. Negative for abdominal distention, constipation, diarrhea and rectal pain.  Endocrine: Negative for hot flashes.  Musculoskeletal: Negative for arthralgias.  Skin: Negative for itching and rash.  Neurological: Negative for dizziness.  Hematological: Negative for adenopathy. Does not bruise/bleed easily.  Psychiatric/Behavioral: Negative for depression. The patient is  not nervous/anxious.       PHYSICAL EXAMINATION  ECOG PERFORMANCE STATUS: 1 - Symptomatic but completely ambulatory  Vitals:   10/06/18 0955  BP: (!) 172/108  Pulse: 73  Resp: 18  Temp: 98.5 F (36.9 C)  SpO2: 99%    Physical Exam Constitutional:      Appearance: Normal appearance.  HENT:     Head: Normocephalic and atraumatic.     Mouth/Throat:     Mouth: Mucous membranes are moist.     Pharynx: Oropharynx is clear. No oropharyngeal exudate.  Eyes:     Pupils: Pupils are equal, round, and reactive to light.  Neck:     Musculoskeletal: Neck supple.  Cardiovascular:     Rate and Rhythm: Normal rate and regular rhythm.     Heart sounds: Normal heart sounds.  Pulmonary:     Effort: Pulmonary effort is normal.     Breath sounds: Normal breath sounds.  Abdominal:     General: Bowel sounds are normal. There is no distension.     Palpations: Abdomen is soft. There is no mass.     Tenderness: There is no abdominal tenderness.  Musculoskeletal:        General: No swelling.  Lymphadenopathy:     Cervical: No cervical adenopathy.  Skin:    General: Skin is warm and dry.     Capillary Refill: Capillary refill takes less than 2 seconds.  Neurological:     General: No focal deficit present.     Mental Status: He is alert.  Psychiatric:        Mood and Affect: Mood normal.        Behavior: Behavior normal.     LABORATORY DATA:  CBC    Component Value Date/Time   WBC 4.1 10/06/2018 0903   WBC 9.3 08/16/2018 1119   RBC 3.17 (L) 10/06/2018 0903   HGB 9.6 (L) 10/06/2018 0903   HCT 30.0 (L) 10/06/2018 0903   PLT 254 10/06/2018 0903   MCV 94.6 10/06/2018 0903   MCH 30.3 10/06/2018 0903   MCHC 32.0 10/06/2018 0903   RDW 18.1 (H) 10/06/2018 0903   LYMPHSABS 1.7 10/06/2018 0903   MONOABS 0.8 10/06/2018 0903   EOSABS 0.2 10/06/2018 0903   BASOSABS 0.1 10/06/2018 0903    CMP     Component Value Date/Time   NA 141 10/06/2018 0903   K 3.3 (L) 10/06/2018 0903   CL  111 10/06/2018 0903   CO2 21 (L) 10/06/2018 0903   GLUCOSE 96 10/06/2018 0903   BUN 15 10/06/2018 0903   CREATININE 0.87 10/06/2018 0903   CALCIUM 8.6 (L) 10/06/2018 0903   PROT 6.8 10/06/2018 0903   ALBUMIN 3.3 (L) 10/06/2018 0903   AST 22 10/06/2018 0903   ALT 20 10/06/2018 0903   ALKPHOS 118 10/06/2018 0903   BILITOT 0.3 10/06/2018 0903   GFRNONAA >60 10/06/2018 0903   GFRAA >60 10/06/2018 0903        ASSESSMENT and THERAPY PLAN:   Rectal cancer metastasized to liver ([place]) Billy Mendoza is a pleasant 68 year old man who is here for evaluation of his metastatic rectal cancer prior to receiving treatment with FOLFOXIRI and Avastin.  He is tolerating his treatment well and clinically sounds like he is responding.  1. Metastatic rectal cancer:  Tumor markers improving, clinically pain and symptoms associated with his rectal cancer presentation is improving.  His labs are stable.  He will proceed with treatment on Saturday.  He is ok to treat with an ANC of 1.4.    2.  Intermittent grade 1 peripheral neuropathy: Very infrequent.  Discussed with patient and reviewed potential for possible increase.  Will monitor.    Maleko will come in on Saturday for his chemotherapy.  He will then have pump DC the following Monday.  He will see Dr. Burr Medico on 1/15 prior to his next treatment.     All questions were answered. The patient knows to call the clinic with any problems, questions or concerns. We can certainly see the patient much sooner if necessary.  A total of (20) minutes of face-to-face time was spent with this patient with greater than 50% of that time in counseling and care-coordination.  This note was electronically signed. Scot Dock, NP 10/06/2018

## 2018-10-06 NOTE — Telephone Encounter (Signed)
Gave avs and calendar ° °

## 2018-10-06 NOTE — Assessment & Plan Note (Signed)
Billy Mendoza is a pleasant 69 year old man who is here for evaluation of his metastatic rectal cancer prior to receiving treatment with FOLFOXIRI and Avastin.  He is tolerating his treatment well and clinically sounds like he is responding.  1. Metastatic rectal cancer:  Tumor markers improving, clinically pain and symptoms associated with his rectal cancer presentation is improving.  His labs are stable.  He will proceed with treatment on Saturday.  He is ok to treat with an ANC of 1.4.    2.  Intermittent grade 1 peripheral neuropathy: Very infrequent.  Discussed with patient and reviewed potential for possible increase.  Will monitor.    Raymar will come in on Saturday for his chemotherapy.  He will then have pump DC the following Monday.  He will see Dr. Burr Medico on 1/15 prior to his next treatment.

## 2018-10-08 ENCOUNTER — Inpatient Hospital Stay: Payer: BLUE CROSS/BLUE SHIELD

## 2018-10-08 VITALS — BP 169/108 | HR 92 | Temp 98.5°F | Resp 16

## 2018-10-08 DIAGNOSIS — C787 Secondary malignant neoplasm of liver and intrahepatic bile duct: Principal | ICD-10-CM

## 2018-10-08 DIAGNOSIS — Z7189 Other specified counseling: Secondary | ICD-10-CM

## 2018-10-08 DIAGNOSIS — C20 Malignant neoplasm of rectum: Secondary | ICD-10-CM

## 2018-10-08 DIAGNOSIS — Z5112 Encounter for antineoplastic immunotherapy: Secondary | ICD-10-CM | POA: Diagnosis not present

## 2018-10-08 MED ORDER — CLONIDINE HCL 0.1 MG PO TABS
0.1000 mg | ORAL_TABLET | Freq: Once | ORAL | Status: AC
  Administered 2018-10-08: 0.1 mg via ORAL

## 2018-10-08 MED ORDER — SODIUM CHLORIDE 0.9 % IV SOLN
5.3000 mg/kg | Freq: Once | INTRAVENOUS | Status: AC
  Administered 2018-10-08: 600 mg via INTRAVENOUS
  Filled 2018-10-08: qty 16

## 2018-10-08 MED ORDER — PALONOSETRON HCL INJECTION 0.25 MG/5ML
0.2500 mg | Freq: Once | INTRAVENOUS | Status: AC
  Administered 2018-10-08: 0.25 mg via INTRAVENOUS

## 2018-10-08 MED ORDER — SODIUM CHLORIDE 0.9 % IV SOLN
Freq: Once | INTRAVENOUS | Status: AC
  Filled 2018-10-08: qty 250

## 2018-10-08 MED ORDER — DEXAMETHASONE SODIUM PHOSPHATE 10 MG/ML IJ SOLN
10.0000 mg | Freq: Once | INTRAMUSCULAR | Status: AC
  Administered 2018-10-08: 10 mg via INTRAVENOUS

## 2018-10-08 MED ORDER — DEXTROSE 5 % IV SOLN
Freq: Once | INTRAVENOUS | Status: AC
  Administered 2018-10-08: 11:00:00 via INTRAVENOUS
  Filled 2018-10-08: qty 250

## 2018-10-08 MED ORDER — SODIUM CHLORIDE 0.9 % IV SOLN
3000.0000 mg/m2 | INTRAVENOUS | Status: DC
  Administered 2018-10-08: 7250 mg via INTRAVENOUS
  Filled 2018-10-08: qty 145

## 2018-10-08 MED ORDER — OXALIPLATIN CHEMO INJECTION 100 MG/20ML
84.0000 mg/m2 | Freq: Once | INTRAVENOUS | Status: AC
  Administered 2018-10-08: 200 mg via INTRAVENOUS
  Filled 2018-10-08: qty 40

## 2018-10-08 MED ORDER — SODIUM CHLORIDE 0.9 % IV SOLN
Freq: Once | INTRAVENOUS | Status: AC
  Administered 2018-10-08: 09:00:00 via INTRAVENOUS
  Filled 2018-10-08: qty 250

## 2018-10-08 MED ORDER — LEUCOVORIN CALCIUM INJECTION 350 MG
200.0000 mg/m2 | Freq: Once | INTRAVENOUS | Status: AC
  Administered 2018-10-08: 482 mg via INTRAVENOUS
  Filled 2018-10-08: qty 24.1

## 2018-10-08 MED ORDER — ATROPINE SULFATE 1 MG/ML IJ SOLN
INTRAMUSCULAR | Status: AC
  Filled 2018-10-08: qty 1

## 2018-10-08 MED ORDER — CLONIDINE HCL 0.1 MG PO TABS
ORAL_TABLET | ORAL | Status: AC
  Filled 2018-10-08: qty 1

## 2018-10-08 MED ORDER — ATROPINE SULFATE 1 MG/ML IJ SOLN
0.5000 mg | Freq: Once | INTRAMUSCULAR | Status: AC | PRN
  Administered 2018-10-08: 0.5 mg via INTRAVENOUS

## 2018-10-08 MED ORDER — PALONOSETRON HCL INJECTION 0.25 MG/5ML
INTRAVENOUS | Status: AC
  Filled 2018-10-08: qty 5

## 2018-10-08 MED ORDER — IRINOTECAN HCL CHEMO INJECTION 100 MG/5ML
165.0000 mg/m2 | Freq: Once | INTRAVENOUS | Status: AC
  Administered 2018-10-08: 400 mg via INTRAVENOUS
  Filled 2018-10-08: qty 15

## 2018-10-08 MED ORDER — DEXAMETHASONE SODIUM PHOSPHATE 10 MG/ML IJ SOLN
INTRAMUSCULAR | Status: AC
  Filled 2018-10-08: qty 1

## 2018-10-08 NOTE — Progress Notes (Signed)
Per Dr. Irene Limbo okay to proceed with avastin today, will monitor for SBP over 170.

## 2018-10-10 ENCOUNTER — Inpatient Hospital Stay: Payer: BLUE CROSS/BLUE SHIELD

## 2018-10-10 VITALS — BP 147/97 | HR 81 | Temp 98.4°F | Resp 16

## 2018-10-10 DIAGNOSIS — C20 Malignant neoplasm of rectum: Secondary | ICD-10-CM

## 2018-10-10 DIAGNOSIS — Z5112 Encounter for antineoplastic immunotherapy: Secondary | ICD-10-CM | POA: Diagnosis not present

## 2018-10-10 DIAGNOSIS — C787 Secondary malignant neoplasm of liver and intrahepatic bile duct: Principal | ICD-10-CM

## 2018-10-10 DIAGNOSIS — Z7189 Other specified counseling: Secondary | ICD-10-CM

## 2018-10-10 MED ORDER — TBO-FILGRASTIM 480 MCG/0.8ML ~~LOC~~ SOSY
PREFILLED_SYRINGE | SUBCUTANEOUS | Status: AC
  Filled 2018-10-10: qty 0.8

## 2018-10-10 MED ORDER — HEPARIN SOD (PORK) LOCK FLUSH 100 UNIT/ML IV SOLN
500.0000 [IU] | Freq: Once | INTRAVENOUS | Status: AC | PRN
  Administered 2018-10-10: 500 [IU]
  Filled 2018-10-10: qty 5

## 2018-10-10 MED ORDER — TBO-FILGRASTIM 480 MCG/0.8ML ~~LOC~~ SOSY
480.0000 ug | PREFILLED_SYRINGE | Freq: Once | SUBCUTANEOUS | Status: AC
  Administered 2018-10-10: 480 ug via SUBCUTANEOUS

## 2018-10-10 MED ORDER — SODIUM CHLORIDE 0.9% FLUSH
10.0000 mL | INTRAVENOUS | Status: DC | PRN
  Administered 2018-10-10: 10 mL
  Filled 2018-10-10: qty 10

## 2018-10-17 NOTE — Progress Notes (Signed)
[place]   [phone] Fax:(336) [phone]   Clinic Follow up Note   Patient Care Team: Merry Proud, MD as PCP - General (Internal Medicine) Truitt Merle, MD as Consulting Physician (Hematology) Carol Ada, MD as Consulting Physician (Gastroenterology)  Date of Service:  10/19/2018  CHIEF COMPLAINT: F/u of rectal cancer  SUMMARY OF ONCOLOGIC HISTORY: Oncology History   Cancer Staging Rectal cancer metastasized to liver [facility]) Staging form: Colon and Rectum, AJCC 8th Edition - Clinical stage from 07/28/2018: Stage IVB (cTX, cNX, pM1b) - Signed by Truitt Merle, MD on 08/18/2018       Rectal cancer metastasized to liver ([place])   07/28/2018 Initial Biopsy    Biopsy 07/28/18 Final Microscopic Diagnosis A.Colon, Ascending, Polyp:  -Fragments of tubular adenoma -No high-grade dysplasia of malignancy.  B. Rectum, Mass, Polyp:  -Invasive adenocarcinoma, moderately - poorly differentiatred.     07/28/2018 Cancer Staging    Staging form: Colon and Rectum, AJCC 8th Edition - Clinical stage from 07/28/2018: Stage IVB (cTX, cNX, pM1b) - Signed by Truitt Merle, MD on 08/18/2018    08/04/2018 Initial Diagnosis    Rectal cancer metastasized to liver ([name])    08/16/2018 Pathology Results    Biospy  Diagnosis 08/16/18 Liver, needle/core biopsy - ADENOCARCINOMA. Microscopic Comment By morphology, the findings are compatible with the provided clinical history of colorectal adenocarcinoma. Results reported to [name] on 08/18/2018. Intradepartmental consultation was obtained ([name]).     08/16/2018 Miscellaneous    Foundation 1 genomic testing: MS-stable Tumor mutation burden  3/Mb K-ras/NRAS wild-type BRAF V600 E KDMSA R266Q PTEN T369f* SMAD4 loss TP53 loss      08/18/2018 Procedure    Colonoscopy by Dr. HBenson Norwayshowed malignant appearing partially obstructing tumor in the rectum, 8 cm above anal verge.    08/18/2018 - 09/07/2018  Chemotherapy    CAPOX q3weeks with Xeloda 20019mBID 2 weeks on, 1 weeks off starting 08/18/18.  Swithced treatment due to BRAF mutation.     09/07/2018 -  Chemotherapy    FOLFOXIRI and avastin every 2 weeks starting 09/07/18      CURRENT THERAPY:  FOLFOXIRI and Avastin every 2 weeks starting 09/07/18  INTERVAL HISTORY:  [name] here for a follow up and FOLFOXIRI. He presents to the clinic today by himself. He notes after his last chemo he still get fatigued but able to go to work for 10 hours or less. He notes his rectal pain has much improved with treatment and only takes ibuprofen as needed. He denies any neuropathy.    He notes at home his BP has been increasing. Yesterday at home it was 130-140 range. He notes having personal issues going on with his father's health. [name] as he is the POA. He is willing to try medication. He is also willing [name]    REVIEW OF SYSTEMS:   Constitutional: Denies fevers, chills or abnormal weight loss (+) fatigue Eyes: Denies blurriness of vision Ears, nose, mouth, throat, and face: Denies mucositis or sore throat Respiratory: Denies cough, dyspnea or wheezes Cardiovascular: Denies palpitation, chest discomfort or lower extremity swelling Gastrointestinal:  Denies nausea, heartburn or change in bowel habits (+) Minimal rectal pain  Skin: Denies abnormal skin rashes Lymphatics: Denies new lymphadenopathy or easy bruising Neurological:Denies numbness, tingling or new weaknesses Behavioral/Psych: Mood is stable, no new changes (+)  Stress All other systems were reviewed with the patient and are negative.  MEDICAL HISTORY:  History reviewed. No pertinent past medical history.  SURGICAL HISTORY: Past Surgical History:  Procedure Laterality Date  . IR IMAGING GUIDED PORT INSERTION  08/16/2018  . IR US GUIDE BX ASP/DRAIN   08/16/2018    I have reviewed the social history and family history with the patient and they are unchanged from previous note.  ALLERGIES:  has No Known Allergies.  MEDICATIONS:  Current Outpatient Medications  Medication Sig Dispense Refill  . diphenoxylate-atropine (LOMOTIL) 2.5-0.025 MG tablet Take 1-2 tablets by mouth 4 (four) times daily as needed for diarrhea or loose stools. Up to 8 tabs daily for severe diarrhea 60 tablet 2  . ibuprofen (ADVIL,MOTRIN) 200 MG tablet Take 200 mg by mouth every 6 (six) hours as needed.    Marland Kitchen amLODipine (NORVASC) 5 MG tablet Take 1 tablet (5 mg total) by mouth daily. 30 tablet 2   No current facility-administered medications for this visit.     PHYSICAL EXAMINATION: ECOG PERFORMANCE STATUS: 1 - Symptomatic but completely ambulatory  Vitals:   10/19/18 0921 10/19/18 0925  BP: (!) 166/115 (!) 174/108  Pulse: 79   Resp: 17   Temp: 98.6 F (37 C)   SpO2: 100%    Filed Weights   10/19/18 0921  Weight: 243 lb 14.4 oz (110.6 kg)    GENERAL:alert, no distress and comfortable SKIN: skin color, texture, turgor are normal, no rashes or significant lesions EYES: normal, Conjunctiva are pink and non-injected, sclera clear OROPHARYNX:no exudate, no erythema and lips, buccal mucosa, and tongue normal  NECK: supple, thyroid normal size, non-tender, without nodularity LYMPH:  no palpable lymphadenopathy in the cervical, axillary or inguinal LUNGS: clear to auscultation and percussion with normal breathing effort HEART: regular rate & rhythm and no murmurs and no lower extremity edema ABDOMEN:abdomen soft, non-tender and normal bowel sounds Musculoskeletal:no cyanosis of digits and no clubbing  NEURO: alert & oriented x 3 with fluent speech, no focal motor/sensory deficits  LABORATORY DATA:  I have reviewed the data as listed CBC Latest Ref Rng & Units 10/19/2018 10/06/2018 09/21/2018  WBC 4.0 - 10.5 K/uL 3.9(L) 4.1 6.7  Hemoglobin 13.0 - 17.0 g/dL  9.9(L) 9.6(L) 9.4(L)  Hematocrit 39.0 - 52.0 % 30.8(L) 30.0(L) 29.3(L)  Platelets 150 - 400 K/uL 160 254 166     CMP Latest Ref Rng & Units 10/19/2018 10/06/2018 09/21/2018  Glucose 70 - 99 mg/dL 97 96 109(H)  BUN 8 - 23 mg/dL 14 15 16   Creatinine 0.61 - 1.24 mg/dL 0.79 0.87 0.79  Sodium 135 - 145 mmol/L 142 141 141  Potassium 3.5 - 5.1 mmol/L 3.2(L) 3.3(L) 3.7  Chloride 98 - 111 mmol/L 111 111 109  CO2 22 - 32 mmol/L 22 21(L) 23  Calcium 8.9 - 10.3 mg/dL 8.5(L) 8.6(L) 8.5(L)  Total Protein 6.5 - 8.1 g/dL 7.0 6.8 6.7  Total Bilirubin 0.3 - 1.2 mg/dL 0.4 0.3 0.2(L)  Alkaline Phos 38 - 126 U/L 112 118 141(H)  AST 15 - 41 U/L 21 22 19   ALT 0 - 44 U/L 20 20 17       RADIOGRAPHIC STUDIES: I have personally reviewed the radiological images as listed and agreed with the findings in the report. No results found.   ASSESSMENT & PLAN:  Billy Mendoza is a 68 y.o. male with   1. Proximal rectal adenocarcinoma, metastatic to liver and likely tolungsandnodes, MSS, KRAS/NRAS wild type, BRAF V600E mutation (+) -  He was diagnosed in10/2019.He completed 1 cycle of CAPOX before changing to FOLFOXIRI and Avastin due to BRAF mutation in his tumor.  -I discussed that his metastatic disease is not curable, he will continue treatment for as long as he can tolerate to control disease. I discussed at some point his cancer will progress and we will switch to another treatment regimen. He understands.  -He continues to tolerate FOLFOXIRI moderately well with fatigue, but still able to work full time   -Labs reviewed, WBC at 3.9, Hg at 9.9, ANC at 1.5, otherwise CBC stable. CEA, CMP are still pending. Will proceed with cycle 4 FOLFOXIRI today with same dose.  -I discussed he has not been on full dose treatment. He opted to wait until after his [place] trip before increasing his dose. This is understandable.  -Next CT scan before next visit.  -His abdominal pain has resolved since he started chemotherapy,  his tumor marker CEA has also significantly decreased, clinically he is responding to chemo. --He has had mild weight loss, he will speak with dietician in infusion room today. I encouraged him to increase his nutritional supplement to maintain his weight.  -F/u in 2-3 weeks   2. Rectal pain and bleeding, constipation  -He uses stool softeners and laxatives for his constipation. -Will treatment his pain is minimal now. Uses Ibuprofen as needed.   3. Anemiasecondary tocancer and chemotherapy -Labs reviewed,Hg stable at 9.9 today (10/19/18)   4. HTN  -His BP has been intermittently high during his office visits. Not on medication currently  -BP at 174/108 today (10/19/2018) -He notes BP at home has been increasing to 130-140 range. He notes to being stressed lately due to father's health.  -I discussed Avastin can also increase his BP. I recommend him starting BP medication to control this as if untreated can lead to stroke or heart attack. He agreed.  -I called in Amlodipine today for him to start -Will recheck BP in clinic today, If still high will give 1 dose clondidine today    5. Goal of care discussion -He is full code now -He understands his disease is incurable,and the goal of therapy is palliative to prolong his life.    Plan -I called in Amlodipine 59m daily today  -Labs reviewed and adequate to proceed cycle 4 FOLFOXIRI and Avastin with same dose today  -Lab, flush, f/u and chemo FOLFOXIRI and Avastin every 2 weeks X2, starting on 2/3   -CT scan around 1/30, f/u on 2/3   No problem-specific Assessment & Plan notes found for this encounter.   Orders Placed This Encounter  Procedures  . CT Abdomen Pelvis W Contrast    Standing Status:   Future    Standing Expiration Date:   10/19/2019    Order Specific Question:   If indicated for the ordered procedure, I authorize the administration of contrast media per Radiology protocol    Answer:   Yes    Order Specific  Question:   Preferred imaging location?    Answer:   [facility]    Order Specific Question:   Is Oral Contrast requested for this exam?    Answer:   Yes, Per Radiology protocol    Order Specific Question:   Radiology Contrast Protocol - do NOT remove file path    Answer:   \\[email or web address]  . CT Chest W Contrast    Standing Status:   Future    Standing Expiration Date:   10/19/2019  Order Specific Question:   If indicated for the ordered procedure, I authorize the administration of contrast media per Radiology protocol    Answer:   Yes    Order Specific Question:   Preferred imaging location?    Answer:   [facility]    Order Specific Question:   Radiology Contrast Protocol - do NOT remove file path    Answer:   \\[email or web address]   All questions were answered. The patient knows to call the clinic with any problems, questions or concerns. No barriers to learning was detected. I spent 20 minutes counseling the patient face to face. The total time spent in the appointment was 25 minutes and more than 50% was on counseling and review of test results     Truitt Merle, MD 10/19/2018   I, Joslyn Devon, am acting as scribe for Truitt Merle, MD.   I have reviewed the above documentation for accuracy and completeness, and I agree with the above.

## 2018-10-19 ENCOUNTER — Inpatient Hospital Stay (HOSPITAL_BASED_OUTPATIENT_CLINIC_OR_DEPARTMENT_OTHER): Payer: BLUE CROSS/BLUE SHIELD | Admitting: Hematology

## 2018-10-19 ENCOUNTER — Inpatient Hospital Stay: Payer: BLUE CROSS/BLUE SHIELD | Admitting: Nutrition

## 2018-10-19 ENCOUNTER — Inpatient Hospital Stay: Payer: BLUE CROSS/BLUE SHIELD

## 2018-10-19 ENCOUNTER — Encounter: Payer: Self-pay | Admitting: Hematology

## 2018-10-19 ENCOUNTER — Encounter: Payer: Self-pay | Admitting: General Practice

## 2018-10-19 VITALS — BP 133/84

## 2018-10-19 VITALS — BP 174/108 | HR 79 | Temp 98.6°F | Resp 17 | Ht 73.0 in | Wt 243.9 lb

## 2018-10-19 DIAGNOSIS — Z5112 Encounter for antineoplastic immunotherapy: Secondary | ICD-10-CM | POA: Diagnosis not present

## 2018-10-19 DIAGNOSIS — C20 Malignant neoplasm of rectum: Secondary | ICD-10-CM

## 2018-10-19 DIAGNOSIS — G629 Polyneuropathy, unspecified: Secondary | ICD-10-CM | POA: Diagnosis not present

## 2018-10-19 DIAGNOSIS — C787 Secondary malignant neoplasm of liver and intrahepatic bile duct: Principal | ICD-10-CM

## 2018-10-19 DIAGNOSIS — Z7189 Other specified counseling: Secondary | ICD-10-CM

## 2018-10-19 DIAGNOSIS — I10 Essential (primary) hypertension: Secondary | ICD-10-CM | POA: Diagnosis not present

## 2018-10-19 DIAGNOSIS — D6481 Anemia due to antineoplastic chemotherapy: Secondary | ICD-10-CM

## 2018-10-19 DIAGNOSIS — K5900 Constipation, unspecified: Secondary | ICD-10-CM

## 2018-10-19 LAB — CBC WITH DIFFERENTIAL (CANCER CENTER ONLY)
Abs Immature Granulocytes: 0.01 10*3/uL (ref 0.00–0.07)
Basophils Absolute: 0 10*3/uL (ref 0.0–0.1)
Basophils Relative: 1 %
Eosinophils Absolute: 0.1 10*3/uL (ref 0.0–0.5)
Eosinophils Relative: 2 %
HCT: 30.8 % — ABNORMAL LOW (ref 39.0–52.0)
Hemoglobin: 9.9 g/dL — ABNORMAL LOW (ref 13.0–17.0)
IMMATURE GRANULOCYTES: 0 %
Lymphocytes Relative: 48 %
Lymphs Abs: 1.8 10*3/uL (ref 0.7–4.0)
MCH: 30.3 pg (ref 26.0–34.0)
MCHC: 32.1 g/dL (ref 30.0–36.0)
MCV: 94.2 fL (ref 80.0–100.0)
Monocytes Absolute: 0.4 10*3/uL (ref 0.1–1.0)
Monocytes Relative: 10 %
Neutro Abs: 1.5 10*3/uL — ABNORMAL LOW (ref 1.7–7.7)
Neutrophils Relative %: 39 %
Platelet Count: 160 10*3/uL (ref 150–400)
RBC: 3.27 MIL/uL — AB (ref 4.22–5.81)
RDW: 18.4 % — AB (ref 11.5–15.5)
WBC: 3.9 10*3/uL — AB (ref 4.0–10.5)
nRBC: 0 % (ref 0.0–0.2)

## 2018-10-19 LAB — FERRITIN: Ferritin: 101 ng/mL (ref 24–336)

## 2018-10-19 LAB — CMP (CANCER CENTER ONLY)
ALT: 20 U/L (ref 0–44)
ANION GAP: 9 (ref 5–15)
AST: 21 U/L (ref 15–41)
Albumin: 3.4 g/dL — ABNORMAL LOW (ref 3.5–5.0)
Alkaline Phosphatase: 112 U/L (ref 38–126)
BUN: 14 mg/dL (ref 8–23)
CO2: 22 mmol/L (ref 22–32)
Calcium: 8.5 mg/dL — ABNORMAL LOW (ref 8.9–10.3)
Chloride: 111 mmol/L (ref 98–111)
Creatinine: 0.79 mg/dL (ref 0.61–1.24)
GFR, Est AFR Am: >60 mL/min (ref >60)
GFR, Estimated: >60 mL/min (ref >60)
Glucose, Bld: 97 mg/dL (ref 70–99)
Potassium: 3.2 mmol/L — ABNORMAL LOW (ref 3.5–5.1)
Sodium: 142 mmol/L (ref 135–145)
Total Bilirubin: 0.4 mg/dL (ref 0.3–1.2)
Total Protein: 7 g/dL (ref 6.5–8.1)

## 2018-10-19 LAB — TOTAL PROTEIN, URINE DIPSTICK: PROTEIN: NEGATIVE mg/dL

## 2018-10-19 LAB — CEA (IN HOUSE-CHCC): CEA (CHCC-In House): 61.42 ng/mL — ABNORMAL HIGH (ref 0.00–5.00)

## 2018-10-19 MED ORDER — DEXTROSE 5 % IV SOLN
Freq: Once | INTRAVENOUS | Status: AC
  Administered 2018-10-19: 11:00:00 via INTRAVENOUS
  Filled 2018-10-19: qty 250

## 2018-10-19 MED ORDER — CLONIDINE HCL 0.1 MG PO TABS
0.1000 mg | ORAL_TABLET | Freq: Once | ORAL | Status: AC
  Administered 2018-10-19: 0.1 mg via ORAL

## 2018-10-19 MED ORDER — DEXAMETHASONE SODIUM PHOSPHATE 10 MG/ML IJ SOLN
10.0000 mg | Freq: Once | INTRAMUSCULAR | Status: AC
  Administered 2018-10-19: 10 mg via INTRAVENOUS

## 2018-10-19 MED ORDER — PALONOSETRON HCL INJECTION 0.25 MG/5ML
0.2500 mg | Freq: Once | INTRAVENOUS | Status: AC
  Administered 2018-10-19: 0.25 mg via INTRAVENOUS

## 2018-10-19 MED ORDER — LEUCOVORIN CALCIUM INJECTION 350 MG
200.0000 mg/m2 | Freq: Once | INTRAVENOUS | Status: AC
  Administered 2018-10-19: 482 mg via INTRAVENOUS
  Filled 2018-10-19: qty 24.1

## 2018-10-19 MED ORDER — PALONOSETRON HCL INJECTION 0.25 MG/5ML
INTRAVENOUS | Status: AC
  Filled 2018-10-19: qty 5

## 2018-10-19 MED ORDER — DEXTROSE 5 % IV SOLN
Freq: Once | INTRAVENOUS | Status: AC
  Administered 2018-10-19: 10:00:00 via INTRAVENOUS
  Filled 2018-10-19: qty 250

## 2018-10-19 MED ORDER — OXALIPLATIN CHEMO INJECTION 100 MG/20ML
84.0000 mg/m2 | Freq: Once | INTRAVENOUS | Status: AC
  Administered 2018-10-19: 200 mg via INTRAVENOUS
  Filled 2018-10-19: qty 40

## 2018-10-19 MED ORDER — SODIUM CHLORIDE 0.9 % IV SOLN
3000.0000 mg/m2 | INTRAVENOUS | Status: DC
  Administered 2018-10-19: 7250 mg via INTRAVENOUS
  Filled 2018-10-19: qty 145

## 2018-10-19 MED ORDER — ATROPINE SULFATE 1 MG/ML IJ SOLN
0.5000 mg | Freq: Once | INTRAMUSCULAR | Status: AC | PRN
  Administered 2018-10-19: 0.5 mg via INTRAVENOUS

## 2018-10-19 MED ORDER — IRINOTECAN HCL CHEMO INJECTION 100 MG/5ML
165.0000 mg/m2 | Freq: Once | INTRAVENOUS | Status: AC
  Administered 2018-10-19: 400 mg via INTRAVENOUS
  Filled 2018-10-19: qty 15

## 2018-10-19 MED ORDER — SODIUM CHLORIDE 0.9% FLUSH
10.0000 mL | INTRAVENOUS | Status: DC | PRN
  Administered 2018-10-19: 10 mL
  Filled 2018-10-19: qty 10

## 2018-10-19 MED ORDER — SODIUM CHLORIDE 0.9 % IV SOLN
5.2000 mg/kg | Freq: Once | INTRAVENOUS | Status: AC
  Administered 2018-10-19: 600 mg via INTRAVENOUS
  Filled 2018-10-19: qty 8

## 2018-10-19 MED ORDER — CLONIDINE HCL 0.1 MG PO TABS
ORAL_TABLET | ORAL | Status: AC
  Filled 2018-10-19: qty 1

## 2018-10-19 MED ORDER — SODIUM CHLORIDE 0.9 % IV SOLN: 5.0000 mg/kg | Freq: Once | INTRAVENOUS | Status: DC

## 2018-10-19 MED ORDER — ATROPINE SULFATE 1 MG/ML IJ SOLN
INTRAMUSCULAR | Status: AC
  Filled 2018-10-19: qty 1

## 2018-10-19 MED ORDER — AMLODIPINE BESYLATE 5 MG PO TABS: 5.0000 mg | ORAL_TABLET | Freq: Every day | ORAL | 2 refills | Status: DC

## 2018-10-19 MED ORDER — DEXAMETHASONE SODIUM PHOSPHATE 10 MG/ML IJ SOLN
INTRAMUSCULAR | Status: AC
  Filled 2018-10-19: qty 1

## 2018-10-19 NOTE — Progress Notes (Signed)
[place] CSW Progress Notes  Follow up visit w patient in infusion room.  MD referred for discussion of disability application process. Spoke w patient - he is not interested in applying for disability at this time; inquired about FMLA.  Advised him to bring in paperwork for himself and possibly his spouse who provides care for him as needed.  Pt will bring to next MD appt so [facility] staff can assist as needed.    Edwyna Shell, LCSW Clinical Social Worker Phone:  [phone]

## 2018-10-19 NOTE — Progress Notes (Signed)
Per Dr. Burr Medico, ok to treat with BP 153/99. Pt to be giving BP meds (see mar).

## 2018-10-19 NOTE — Progress Notes (Signed)
Nutrition follow-up completed with patient receiving chemotherapy for metastatic rectal cancer. Patient reports that he has increased cold sensation that lasts approximately 9 days. He denies nutrition impact symptoms. Weight decreased and documented 243 pounds on January 15 down from 250 pounds November 14. Patient has tried [employer] essentials however does not like these at room temperature.  Nutrition diagnosis: Food and nutrition related knowledge deficit improved.  Intervention: Patient was educated to continue strategies for adequate calories and protein intake Recommended patient continue to strive for weight maintenance. Questions were answered.  Teach back method used.  Monitoring, evaluation, goals: Patient will tolerate adequate calories and protein to minimize weight loss throughout treatment.  Next visit: To be scheduled as needed.  **Disclaimer: This note was dictated with voice recognition software. Similar sounding words can inadvertently be transcribed and this note may contain transcription errors which may not have been corrected upon publication of note.**

## 2018-10-19 NOTE — Patient Instructions (Signed)
[facility] Discharge Instructions for Patients Receiving Chemotherapy  Today you received the following chemotherapy agents Bevacizumab (AVASTIN), Irinotecan (CAMPTOSAR), Oxaliplatin (ELOXATIN), Leucovorin & Flourouracil (ADRUCIL).  To help prevent nausea and vomiting after your treatment, we encourage you to take your nausea medication as prescribed.   If you develop nausea and vomiting that is not controlled by your nausea medication, call the clinic.   BELOW ARE SYMPTOMS THAT SHOULD BE REPORTED IMMEDIATELY:  *FEVER GREATER THAN 100.5 F  *CHILLS WITH OR WITHOUT FEVER  NAUSEA AND VOMITING THAT IS NOT CONTROLLED WITH YOUR NAUSEA MEDICATION  *UNUSUAL SHORTNESS OF BREATH  *UNUSUAL BRUISING OR BLEEDING  TENDERNESS IN MOUTH AND THROAT WITH OR WITHOUT PRESENCE OF ULCERS  *URINARY PROBLEMS  *BOWEL PROBLEMS  UNUSUAL RASH Items with * indicate a potential emergency and should be followed up as soon as possible.  Feel free to call the clinic should you have any questions or concerns. The clinic phone number is [phone].  Please show the [place] at check-in to the Emergency Department and triage nurse.

## 2018-10-20 ENCOUNTER — Telehealth: Payer: Self-pay | Admitting: Hematology

## 2018-10-20 NOTE — Telephone Encounter (Signed)
Called patient about scheduled appts per 01/15 los.  Patient aware of appts and patient is also aware that I have to put the treatment in the book to get added for 02/04.  Patient also stated he is okay with coming two separate days for the appointment.  Will call the patient back when the treatment has been added.

## 2018-10-21 ENCOUNTER — Inpatient Hospital Stay: Payer: BLUE CROSS/BLUE SHIELD

## 2018-10-21 VITALS — BP 133/78

## 2018-10-21 DIAGNOSIS — C787 Secondary malignant neoplasm of liver and intrahepatic bile duct: Principal | ICD-10-CM

## 2018-10-21 DIAGNOSIS — Z7189 Other specified counseling: Secondary | ICD-10-CM

## 2018-10-21 DIAGNOSIS — Z5112 Encounter for antineoplastic immunotherapy: Secondary | ICD-10-CM | POA: Diagnosis not present

## 2018-10-21 DIAGNOSIS — C20 Malignant neoplasm of rectum: Secondary | ICD-10-CM

## 2018-10-21 MED ORDER — SODIUM CHLORIDE 0.9% FLUSH
10.0000 mL | INTRAVENOUS | Status: DC | PRN
  Administered 2018-10-21: 10 mL
  Filled 2018-10-21: qty 10

## 2018-10-21 MED ORDER — HEPARIN SOD (PORK) LOCK FLUSH 100 UNIT/ML IV SOLN
500.0000 [IU] | Freq: Once | INTRAVENOUS | Status: AC | PRN
  Administered 2018-10-21: 500 [IU]
  Filled 2018-10-21: qty 5

## 2018-10-21 MED ORDER — TBO-FILGRASTIM 480 MCG/0.8ML ~~LOC~~ SOSY
480.0000 ug | PREFILLED_SYRINGE | Freq: Once | SUBCUTANEOUS | Status: AC
  Administered 2018-10-21: 480 ug via SUBCUTANEOUS

## 2018-10-21 MED ORDER — TBO-FILGRASTIM 480 MCG/0.8ML ~~LOC~~ SOSY
PREFILLED_SYRINGE | SUBCUTANEOUS | Status: AC
  Filled 2018-10-21: qty 0.8

## 2018-10-21 NOTE — Patient Instructions (Signed)
Tbo-Filgrastim injection What is this medicine? TBO-FILGRASTIM (T B O fil GRA stim) is a granulocyte colony-stimulating factor that stimulates the growth of neutrophils, a type of white blood cell important in the body's fight against infection. It is used to reduce the incidence of fever and infection in patients with certain types of cancer who are receiving chemotherapy that affects the bone marrow. This medicine may be used for other purposes; ask your health care provider or pharmacist if you have questions. COMMON BRAND NAME(S): Granix What should I tell my health care provider before I take this medicine? They need to know if you have any of these conditions: -bone scan or tests planned -kidney disease -sickle cell anemia -an unusual or allergic reaction to tbo-filgrastim, filgrastim, pegfilgrastim, other medicines, foods, dyes, or preservatives -pregnant or trying to get pregnant -breast-feeding How should I use this medicine? This medicine is for injection under the skin. If you get this medicine at home, you will be taught how to prepare and give this medicine. Refer to the Instructions for Use that come with your medication packaging. Use exactly as directed. Take your medicine at regular intervals. Do not take your medicine more often than directed. It is important that you put your used needles and syringes in a special sharps container. Do not put them in a trash can. If you do not have a sharps container, call your pharmacist or healthcare provider to get one. Talk to your pediatrician regarding the use of this medicine in children. While this drug may be prescribed for children as young as 1 month of age for selected conditions, precautions do apply. Overdosage: If you think you have taken too much of this medicine contact a poison control center or emergency room at once. NOTE: This medicine is only for you. Do not share this medicine with others. What if I miss a dose? It is  important not to miss your dose. Call your doctor or health care professional if you miss a dose. What may interact with this medicine? This medicine may interact with the following medications: -medicines that may cause a release of neutrophils, such as lithium This list may not describe all possible interactions. Give your health care provider a list of all the medicines, herbs, non-prescription drugs, or dietary supplements you use. Also tell them if you smoke, drink alcohol, or use illegal drugs. Some items may interact with your medicine. What should I watch for while using this medicine? You may need blood work done while you are taking this medicine. What side effects may I notice from receiving this medicine? Side effects that you should report to your doctor or health care professional as soon as possible: -allergic reactions like skin rash, itching or hives, swelling of the face, lips, or tongue -back pain -blood in the urine -dark urine -dizziness -fast heartbeat -feeling faint -shortness of breath or breathing problems -signs and symptoms of infection like fever or chills; cough; or sore throat -signs and symptoms of kidney injury like trouble passing urine or change in the amount of urine -stomach or side pain, or pain at the shoulder -sweating -swelling of the legs, ankles, or abdomen -tiredness Side effects that usually do not require medical attention (report to your doctor or health care professional if they continue or are bothersome): -bone pain -diarrhea -headache -muscle pain -vomiting This list may not describe all possible side effects. Call your doctor for medical advice about side effects. You may report side effects to FDA at   1-800-FDA-1088. Where should I keep my medicine? Keep out of the reach of children. Store in a refrigerator between 2 and 8 degrees C (36 and 46 degrees F). Keep in carton to protect from light. Throw away this medicine if it is left out  of the refrigerator for more than 5 consecutive days. Throw away any unused medicine after the expiration date. NOTE: This sheet is a summary. It may not cover all possible information. If you have questions about this medicine, talk to your doctor, pharmacist, or health care provider.  2019 Elsevier/Gold Standard (2017-05-11 16:56:18)  

## 2018-10-23 ENCOUNTER — Other Ambulatory Visit: Payer: Self-pay | Admitting: Hematology

## 2018-10-23 MED ORDER — POTASSIUM CHLORIDE CRYS ER 20 MEQ PO TBCR: 20.0000 meq | EXTENDED_RELEASE_TABLET | Freq: Every day | ORAL | 1 refills | Status: DC

## 2018-10-25 ENCOUNTER — Telehealth: Payer: Self-pay

## 2018-10-25 NOTE — Telephone Encounter (Signed)
Spoke with patient with lab results per Dr. Burr Medico labs are good except Potassium is running a little low, instructed to take Potassium 20 meq daily which has been sent in, patient verbalized an understanding and we will work on getting his CT scan scheduled.

## 2018-11-04 ENCOUNTER — Other Ambulatory Visit: Payer: Self-pay

## 2018-11-04 ENCOUNTER — Encounter (HOSPITAL_COMMUNITY): Payer: Self-pay

## 2018-11-04 ENCOUNTER — Encounter: Payer: Self-pay | Admitting: Hematology

## 2018-11-04 ENCOUNTER — Inpatient Hospital Stay (HOSPITAL_BASED_OUTPATIENT_CLINIC_OR_DEPARTMENT_OTHER): Payer: BLUE CROSS/BLUE SHIELD | Admitting: Hematology

## 2018-11-04 ENCOUNTER — Ambulatory Visit (HOSPITAL_COMMUNITY)
Admission: RE | Admit: 2018-11-04 | Discharge: 2018-11-04 | Disposition: A | Discharge status: Home / Self Care | Payer: BLUE CROSS/BLUE SHIELD | Source: Ambulatory Visit | Attending: Hematology | Admitting: Hematology

## 2018-11-04 VITALS — BP 176/122 | HR 79 | Temp 98.2°F | Resp 20 | Ht 73.0 in | Wt 255.9 lb

## 2018-11-04 DIAGNOSIS — I10 Essential (primary) hypertension: Secondary | ICD-10-CM

## 2018-11-04 DIAGNOSIS — I2699 Other pulmonary embolism without acute cor pulmonale: Secondary | ICD-10-CM

## 2018-11-04 DIAGNOSIS — C20 Malignant neoplasm of rectum: Secondary | ICD-10-CM

## 2018-11-04 DIAGNOSIS — D6481 Anemia due to antineoplastic chemotherapy: Secondary | ICD-10-CM

## 2018-11-04 DIAGNOSIS — C787 Secondary malignant neoplasm of liver and intrahepatic bile duct: Secondary | ICD-10-CM | POA: Insufficient documentation

## 2018-11-04 DIAGNOSIS — Z5112 Encounter for antineoplastic immunotherapy: Secondary | ICD-10-CM | POA: Diagnosis not present

## 2018-11-04 MED ORDER — SODIUM CHLORIDE (PF) 0.9 % IJ SOLN
INTRAMUSCULAR | Status: AC
  Filled 2018-11-04: qty 50

## 2018-11-04 MED ORDER — CLONIDINE HCL 0.1 MG PO TABS: 0.1000 mg | ORAL_TABLET | Freq: Once | ORAL | Status: DC

## 2018-11-04 MED ORDER — IOHEXOL 300 MG/ML  SOLN
100.0000 mL | Freq: Once | INTRAMUSCULAR | Status: AC | PRN
  Administered 2018-11-04: 100 mL via INTRAVENOUS

## 2018-11-04 MED ORDER — CLONIDINE HCL 0.1 MG PO TABS
0.1000 mg | ORAL_TABLET | Freq: Once | ORAL | Status: AC
  Administered 2018-11-04: 0.1 mg via ORAL

## 2018-11-04 MED ORDER — RIVAROXABAN (XARELTO) VTE STARTER PACK (15 & 20 MG): ORAL_TABLET | ORAL | 0 refills | Status: DC

## 2018-11-04 MED ORDER — CLONIDINE HCL 0.1 MG PO TABS
ORAL_TABLET | ORAL | Status: AC
  Filled 2018-11-04: qty 1

## 2018-11-04 MED FILL — XARELTO STARTER PACK: 15 & 20 | 30 days supply | Qty: 51 | Fill #0

## 2018-11-04 NOTE — Progress Notes (Signed)
[place]   [phone] [phone]   Clinic Follow up Note   Patient Care Team: Merry Proud, MD as PCP - General (Internal Medicine) Truitt Merle, MD as Consulting Physician (Hematology) Carol Ada, MD as Consulting Physician (Gastroenterology)  Date of Service:  11/04/2018  CHIEF COMPLAINT: new PE   SUMMARY OF ONCOLOGIC HISTORY: Oncology History   Cancer Staging Rectal cancer metastasized to liver [facility]) Staging form: Colon and Rectum, AJCC 8th Edition - Clinical stage from 07/28/2018: Stage IVB (cTX, cNX, pM1b) - Signed by Truitt Merle, MD on 08/18/2018       Rectal cancer metastasized to liver ([place])   07/28/2018 Initial Biopsy    Biopsy 07/28/18 Final Microscopic Diagnosis A.Colon, Ascending, Polyp:  -Fragments of tubular adenoma -No high-grade dysplasia of malignancy.  B. Rectum, Mass, Polyp:  -Invasive adenocarcinoma, moderately - poorly differentiatred.     07/28/2018 Cancer Staging    Staging form: Colon and Rectum, AJCC 8th Edition - Clinical stage from 07/28/2018: Stage IVB (cTX, cNX, pM1b) - Signed by Truitt Merle, MD on 08/18/2018    08/04/2018 Initial Diagnosis    Rectal cancer metastasized to liver ([place])    08/16/2018 Pathology Results    Biospy  Diagnosis 08/16/18 Liver, needle/core biopsy - ADENOCARCINOMA. Microscopic Comment By morphology, the findings are compatible with the provided clinical history of colorectal adenocarcinoma. Results reported to [name] on 08/18/2018. Intradepartmental consultation was obtained ([name]).     08/16/2018 Miscellaneous    Foundation 1 genomic testing: MS-stable Tumor mutation burden  3/Mb K-ras/NRAS wild-type BRAF V600 E KDMSA R266Q PTEN T344f* SMAD4 loss TP53 loss      08/18/2018 Procedure    Colonoscopy by Dr. HBenson Norwayshowed malignant appearing partially obstructing tumor in the rectum, 8 cm above anal verge.    08/18/2018 - 09/07/2018 Chemotherapy   CAPOX q3weeks with Xeloda 20054mBID 2 weeks on, 1 weeks off starting 08/18/18.  Swithced treatment due to BRAF mutation.     09/07/2018 -  Chemotherapy    FOLFOXIRI and avastin every 2 weeks starting 09/07/18    11/04/2018 Imaging    CT CAP 11/04/18 IMPRESSION: 1. Peripheral filling defect in the right main pulmonary artery extending primarily into the right lower lobe pulmonary artery. While the peripheral location indicates some degree of chronicity of this pulmonary embolus, this is a new finding compared to 07/28/2018. The right ventricular to left ventricular ratio is 0.7, which does not specifically indicate right heart strain. 2. The malignancy appears considerably improved, with reduced size pulmonary nodules; reduced size and increased internal necrosis of the hepatic metastatic lesions; marked improvement in the thoracic adenopathy; reduced conspicuity of the rectal tumor; and significant reduction in perirectal tumor burden/adenopathy. 3. Other imaging findings of potential clinical significance: Aortic Atherosclerosis (ICD10-I70.0). Coronary atherosclerosis. Bilateral renal cysts. Small umbilical hernia contains adipose tissue.  Critical Value/emergent results were called by telephone at the time of interpretation on 11/04/2018 at 3:05 pm to Dr. YATruitt Merle who verbally acknowledged these results.      CURRENT THERAPY:  FOLFOXIRI and Avastin every 2 weeks starting 09/07/18  INTERVAL HISTORY:  [name] here for a follow up after CT CAP today, which showed a new right PE. [place] He denies chest pain, leg swelling on any symptoms prior to this. He notes his father's memorial service will be on 2/14 ans 2/17 and would like to attend by pushing treatment back.  REVIEW OF SYSTEMS:   Constitutional: Denies fevers, chills or abnormal weight loss Eyes: Denies blurriness of vision Ears, nose, mouth, throat,  and face: Denies mucositis or sore throat Respiratory: Denies cough, dyspnea or wheezes Cardiovascular: Denies palpitation, chest discomfort or lower extremity swelling Gastrointestinal:  Denies nausea, heartburn or change in bowel habits Skin: Denies abnormal skin rashes Lymphatics: Denies new lymphadenopathy or easy bruising Neurological:Denies numbness, tingling or new weaknesses Behavioral/Psych: Mood is stable, no new changes  All other systems were reviewed with the patient and are negative.  MEDICAL HISTORY:  History reviewed. No pertinent past medical history.  SURGICAL HISTORY: Past Surgical History:  Procedure Laterality Date  . IR IMAGING GUIDED PORT INSERTION  08/16/2018  . IR US GUIDE BX ASP/DRAIN  08/16/2018    I have reviewed the social history and family history with the patient and they are unchanged from previous note.  ALLERGIES:  has No Known Allergies.  MEDICATIONS:  Current Outpatient Medications  Medication Sig Dispense Refill  . amLODipine (NORVASC) 5 MG tablet Take 1 tablet (5 mg total) by mouth daily. 30 tablet 2  . diphenoxylate-atropine (LOMOTIL) 2.5-0.025 MG tablet Take 1-2 tablets by mouth 4 (four) times daily as needed for diarrhea or loose stools. Up to 8 tabs daily for severe diarrhea 60 tablet 2  . ibuprofen (ADVIL,MOTRIN) 200 MG tablet Take 200 mg by mouth every 6 (six) hours as needed.    . potassium chloride SA (K-DUR,KLOR-CON) 20 MEQ tablet Take 1 tablet (20 mEq total) by mouth daily. 30 tablet 1  . Rivaroxaban 15 & 20 MG TBPK Take as directed on package: Start with one 64m tablet by mouth twice a day with food. On Day 22, switch to one 258mtablet once a day with food. 51 each 0   No current facility-administered medications for this visit.    Facility-Administered Medications Ordered in Other Visits  Medication Dose Route Frequency Provider Last Rate Last Dose  . sodium chloride (PF) 0.9 % injection             PHYSICAL  EXAMINATION: ECOG PERFORMANCE STATUS: 0 - Asymptomatic  Vitals:   11/04/18 1541 11/04/18 1552  BP: (!) 177/122 (!) 176/122  Pulse: 79   Resp: 20   Temp: 98.2 F (36.8 C)   SpO2: 98%    Filed Weights   11/04/18 1541  Weight: 255 lb 14.4 oz (116.1 kg)    GENERAL:alert, no distress and comfortable SKIN: skin color, texture, turgor are normal, no rashes or significant lesions EYES: normal, Conjunctiva are pink and non-injected, sclera clear OROPHARYNX:no exudate, no erythema and lips, buccal mucosa, and tongue normal  NECK: supple, thyroid normal size, non-tender, without nodularity LYMPH:  no palpable lymphadenopathy in the cervical, axillary or inguinal LUNGS: clear to auscultation and percussion with normal breathing effort HEART: regular rate & rhythm and no murmurs and no lower extremity edema ABDOMEN:abdomen soft, non-tender and normal bowel sounds Musculoskeletal:no cyanosis of digits and no clubbing  NEURO: alert & oriented x 3 with fluent speech, no focal motor/sensory deficits  LABORATORY DATA:  I have reviewed the data as listed CBC Latest Ref Rng & Units 10/19/2018 10/06/2018 09/21/2018  WBC 4.0 - 10.5 K/uL 3.9(L) 4.1 6.7  Hemoglobin 13.0 - 17.0 g/dL 9.9(L) 9.6(L) 9.4(L)  Hematocrit 39.0 - 52.0 % 30.8(L) 30.0(L) 29.3(L)  Platelets 150 - 400 K/uL 160 254 166     CMP Latest Ref Rng & Units 10/19/2018 10/06/2018 09/21/2018  Glucose 70 - 99 mg/dL 97 96  109(H)  BUN 8 - 23 mg/dL 14 15 16   Creatinine 0.61 - 1.24 mg/dL 0.79 0.87 0.79  Sodium 135 - 145 mmol/L 142 141 141  Potassium 3.5 - 5.1 mmol/L 3.2(L) 3.3(L) 3.7  Chloride 98 - 111 mmol/L 111 111 109  CO2 22 - 32 mmol/L 22 21(L) 23  Calcium 8.9 - 10.3 mg/dL 8.5(L) 8.6(L) 8.5(L)  Total Protein 6.5 - 8.1 g/dL 7.0 6.8 6.7  Total Bilirubin 0.3 - 1.2 mg/dL 0.4 0.3 0.2(L)  Alkaline Phos 38 - 126 U/L 112 118 141(H)  AST 15 - 41 U/L 21 22 19   ALT 0 - 44 U/L 20 20 17       RADIOGRAPHIC STUDIES: I have personally reviewed  the radiological images as listed and agreed with the findings in the report. Ct Chest W Contrast  Result Date: 11/04/2018 CLINICAL DATA:  Restaging of metastatic rectal cancer. EXAM: CT CHEST, ABDOMEN, AND PELVIS WITH CONTRAST TECHNIQUE: Multidetector CT imaging of the chest, abdomen and pelvis was performed following the standard protocol during bolus administration of intravenous contrast. CONTRAST:  148m OMNIPAQUE IOHEXOL 300 MG/ML  SOLN COMPARISON:  07/28/2018 FINDINGS: CT CHEST FINDINGS Cardiovascular: As shown on images 96 through 112 of series 5, there is filling defect in the main right pulmonary artery near the bifurcation primarily extending into the right lower lobe pulmonary artery, compatible thrombus. This is peripherally located, and accordingly may be chronic, but was not present on 07/28/2018. Right ventricular to left ventricular ratio 0.7. Atherosclerotic calcification of the aortic arch and of the left anterior descending coronary artery. Right Port-A-Cath tip: SVC. Mediastinum/Nodes: Previous right hilar and right subcarinal adenopathy has essentially resolved. Subcarinal node 0.9 cm in short axis on image 31/2, formerly 2.4 cm. Lungs/Pleura: The previous right middle lobe nodule appears much less solid and more bilobed on today's exam, 0.5 by 0.8 cm. This nodule was previously 0.7 by 0.9 cm and had a much more solid appearance. A right lower lobe nodule on image 93/4 measures 0.7 by 0.5 cm, formerly 1.0 by 0.9 cm. The prior consolidation in the posterior basal segment right lower lobe with adjacent local nodularity has cleared. Musculoskeletal: Unremarkable CT ABDOMEN PELVIS FINDINGS Hepatobiliary: Overall significantly improved hepatic metastatic lesions. The dominant lesion in the dome of the right hepatic lobe measures 5.5 by 3.9 cm on image 44/2, formerly by my measurements 7.2 by 6.0 cm. A second lesion in the right hepatic lobe measures 1.8 by 1.6 cm, formerly 2.2 by 2.1 cm. The  lesions also or more sharply hypodense, favoring internal necrosis. I do not see any definite new lesions. The gallbladder appears unremarkable.  No biliary dilatation. Pancreas: Unremarkable Spleen: Unremarkable Adrenals/Urinary Tract: Bilateral hypodense renal lesions present in a roughly similar pattern to prior exam. No worrisome imaging characteristics. Some of these lesions are technically too small to characterize. The adrenal glands appear normal. The ureters urinary bladder appear normal. Stomach/Bowel: Continued wall thickening in the rectum although without the prior degree of enhancement. The perirectal tumor measuring 3.6 by 3.5 cm previously has indistinct margins and measures a maximum of 1.9 by 1.2 cm today. Stable presacral edema. Significantly diminished perirectal adenopathy, with 1 node measuring 0.8 cm in short axis on image 114 of series 2, formerly 1.6 cm. Vascular/Lymphatic: Aortoiliac atherosclerotic vascular disease. Reproductive: Punctate calcifications in the prostate gland. Other: No supplemental non-categorized findings. Musculoskeletal: Fatty bilateral spermatic cords. Small umbilical hernia contains adipose tissue. Mild sclerosis along the right sacroiliac joint, likely degenerative. IMPRESSION: 1. Peripheral filling  defect in the right main pulmonary artery extending primarily into the right lower lobe pulmonary artery. While the peripheral location indicates some degree of chronicity of this pulmonary embolus, this is a new finding compared to 07/28/2018. The right ventricular to left ventricular ratio is 0.7, which does not specifically indicate right heart strain. 2. The malignancy appears considerably improved, with reduced size pulmonary nodules; reduced size and increased internal necrosis of the hepatic metastatic lesions; marked improvement in the thoracic adenopathy; reduced conspicuity of the rectal tumor; and significant reduction in perirectal tumor burden/adenopathy. 3.  Other imaging findings of potential clinical significance: Aortic Atherosclerosis (ICD10-I70.0). Coronary atherosclerosis. Bilateral renal cysts. Small umbilical hernia contains adipose tissue. Critical Value/emergent results were called by telephone at the time of interpretation on 11/04/2018 at 3:05 pm to [name] , who verbally acknowledged these results. Electronically Signed   By: Van Clines M.D.   On: 11/04/2018 15:06   Ct Abdomen Pelvis W Contrast  Result Date: 11/04/2018 CLINICAL DATA:  Restaging of metastatic rectal cancer. EXAM: CT CHEST, ABDOMEN, AND PELVIS WITH CONTRAST TECHNIQUE: Multidetector CT imaging of the chest, abdomen and pelvis was performed following the standard protocol during bolus administration of intravenous contrast. CONTRAST:  128m OMNIPAQUE IOHEXOL 300 MG/ML  SOLN COMPARISON:  07/28/2018 FINDINGS: CT CHEST FINDINGS Cardiovascular: As shown on images 96 through 112 of series 5, there is filling defect in the main right pulmonary artery near the bifurcation primarily extending into the right lower lobe pulmonary artery, compatible thrombus. This is peripherally located, and accordingly may be chronic, but was not present on 07/28/2018. Right ventricular to left ventricular ratio 0.7. Atherosclerotic calcification of the aortic arch and of the left anterior descending coronary artery. Right Port-A-Cath tip: SVC. Mediastinum/Nodes: Previous right hilar and right subcarinal adenopathy has essentially resolved. Subcarinal node 0.9 cm in short axis on image 31/2, formerly 2.4 cm. Lungs/Pleura: The previous right middle lobe nodule appears much less solid and more bilobed on today's exam, 0.5 by 0.8 cm. This nodule was previously 0.7 by 0.9 cm and had a much more solid appearance. A right lower lobe nodule on image 93/4 measures 0.7 by 0.5 cm, formerly 1.0 by 0.9 cm. The prior consolidation in the posterior basal segment right lower lobe with adjacent local nodularity has  cleared. Musculoskeletal: Unremarkable CT ABDOMEN PELVIS FINDINGS Hepatobiliary: Overall significantly improved hepatic metastatic lesions. The dominant lesion in the dome of the right hepatic lobe measures 5.5 by 3.9 cm on image 44/2, formerly by my measurements 7.2 by 6.0 cm. A second lesion in the right hepatic lobe measures 1.8 by 1.6 cm, formerly 2.2 by 2.1 cm. The lesions also or more sharply hypodense, favoring internal necrosis. I do not see any definite new lesions. The gallbladder appears unremarkable.  No biliary dilatation. Pancreas: Unremarkable Spleen: Unremarkable Adrenals/Urinary Tract: Bilateral hypodense renal lesions present in a roughly similar pattern to prior exam. No worrisome imaging characteristics. Some of these lesions are technically too small to characterize. The adrenal glands appear normal. The ureters urinary bladder appear normal. Stomach/Bowel: Continued wall thickening in the rectum although without the prior degree of enhancement. The perirectal tumor measuring 3.6 by 3.5 cm previously has indistinct margins and measures a maximum of 1.9 by 1.2 cm today. Stable presacral edema. Significantly diminished perirectal adenopathy, with 1 node measuring 0.8 cm in short axis on image 114 of series 2, formerly 1.6 cm. Vascular/Lymphatic: Aortoiliac atherosclerotic vascular disease. Reproductive: Punctate calcifications in the prostate gland. Other: No supplemental non-categorized  findings. Musculoskeletal: Fatty bilateral spermatic cords. Small umbilical hernia contains adipose tissue. Mild sclerosis along the right sacroiliac joint, likely degenerative. IMPRESSION: 1. Peripheral filling defect in the right main pulmonary artery extending primarily into the right lower lobe pulmonary artery. While the peripheral location indicates some degree of chronicity of this pulmonary embolus, this is a new finding compared to 07/28/2018. The right ventricular to left ventricular ratio is 0.7, which  does not specifically indicate right heart strain. 2. The malignancy appears considerably improved, with reduced size pulmonary nodules; reduced size and increased internal necrosis of the hepatic metastatic lesions; marked improvement in the thoracic adenopathy; reduced conspicuity of the rectal tumor; and significant reduction in perirectal tumor burden/adenopathy. 3. Other imaging findings of potential clinical significance: Aortic Atherosclerosis (ICD10-I70.0). Coronary atherosclerosis. Bilateral renal cysts. Small umbilical hernia contains adipose tissue. Critical Value/emergent results were called by telephone at the time of interpretation on 11/04/2018 at 3:05 pm to [name] , who verbally acknowledged these results. Electronically Signed   By: Van Clines M.D.   On: 11/04/2018 15:06     ASSESSMENT & PLAN:  Billy Mendoza is a 68 y.o. male with    1. Right PE -CT CAP from today showed right main Pulmonary artery PE, appears to be subacute or chronic, may have been present for up to a month. He is completely asymptomatic, no leg edema.  He is not hypoxic on ambulatory. -His PE is likely related to his underlying metastatic colon cancer and chemotherapy, especially Avastin. -CT scan showed no evidence of right heart strain.  Given his asymptomatic, I will treat as outpatient. -I discussed anticoagulant treatment with oral Coumadin, Xarelto or Lovenox injections. I reviewed benefits and side effects, especially increased risk of bleeding, in great detail.  -I discussed given his metastasis disease and longterm chemo treatment, I recommend anticoagulant indefinitely, likely for the rest of his life.  -He opted for Xarelto, I prescribed to start today 72m BID for 3 weeks and starting 4th week he will take 243monce daily. I provided him with medication coupon.   2. Proximal rectal adenocarcinoma, metastatic to liver and likely tolungsandnodes, MSS, KRAS/NRAS wild type, BRAF V600E  mutation (+) -He was diagnosed in10/2019.He completed 1 cycle of CAPOX before changing to FOLFOXIRI and Avastin due to BRAF mutation in his tumor.  -I discussed that his metastatic disease is not curable, he will continue treatment for as long as he can tolerate to control disease. I discussed at some point his cancer will progress and we will switch to another treatment regimen. He understands.  -He continues to tolerate FOLFOXIRI moderately well with fatigue, but still able to work full time   -We discussed his CT CAP from today which showed significant improvement in liver mets and rectal mass. This is great response to treatment. Will continue current treatment. -I discussed we will continue treatment for as long as he can tolerate and it controls disease. If he continues to have major response to treatment may consider surgery or procedure.  -F/u in 3 weeks   2. Rectal pain and bleeding, constipation  -He uses stool softeners and laxatives for his constipation. -With treatment his pain and constipation is minimal now. Uses Ibuprofen as needed.   3. Anemiasecondary tocancer and chemotherapy -Labs reviewed,Hg has been stable in 9-10 range lately   4. HTN  -He notes BP at home has been increasing to 130-140 range. He notes to being stressed lately due to father's passing -He recently started  Amlodipine after last visit.  -BP at 174/108 today (11/04/18). Will repeat in clinic today   5. Goal of care discussion -He is full code now -He understands his disease is incurable,and the goal of therapy is palliative to prolong his life.   Plan -I prescribed him Xarelto start pack to start today, he will refill at [facility]  -he will return 2/4 for cycle 5 chemo, will continue same dose  -I will see him before cycle 6    No problem-specific Assessment & Plan notes found for this encounter.   No orders of the defined types were placed in this encounter.  All questions were  answered. The patient knows to call the clinic with any problems, questions or concerns. No barriers to learning was detected. I spent 20 minutes counseling the patient face to face. The total time spent in the appointment was 25 minutes and more than 50% was on counseling and review of test results     Truitt Merle, MD 11/04/2018   I, Joslyn Devon, am acting as scribe for Truitt Merle, MD.   I have reviewed the above documentation for accuracy and completeness, and I agree with the above.

## 2018-11-04 NOTE — Addendum Note (Signed)
Addended by: Tora Kindred on: 11/04/2018 04:27 PM   Modules accepted: Orders

## 2018-11-04 NOTE — Progress Notes (Signed)
Cl

## 2018-11-04 NOTE — Progress Notes (Signed)
Billy Mendoza was taken to the [facility] to see Dr. Burr Medico after CT scan. I notified Cheryl, RN via phone of the existing IV still remaining in Mr. Leyda arm. They will remove as directed by discharge vs. Admission.

## 2018-11-04 NOTE — Addendum Note (Signed)
Addended by: Tora Kindred on: 11/04/2018 04:23 PM   Modules accepted: Orders

## 2018-11-07 ENCOUNTER — Telehealth: Payer: Self-pay | Admitting: Hematology

## 2018-11-07 ENCOUNTER — Other Ambulatory Visit: Payer: BLUE CROSS/BLUE SHIELD

## 2018-11-07 ENCOUNTER — Ambulatory Visit: Payer: BLUE CROSS/BLUE SHIELD | Admitting: Hematology

## 2018-11-07 NOTE — Telephone Encounter (Signed)
Called patient per 01/31 los.  Patient did not answer.

## 2018-11-08 ENCOUNTER — Inpatient Hospital Stay: Payer: BLUE CROSS/BLUE SHIELD

## 2018-11-08 ENCOUNTER — Inpatient Hospital Stay: Payer: BLUE CROSS/BLUE SHIELD | Attending: Adult Health

## 2018-11-08 VITALS — BP 144/82 | HR 68 | Temp 98.4°F | Resp 16 | Wt 246.8 lb

## 2018-11-08 DIAGNOSIS — Z5112 Encounter for antineoplastic immunotherapy: Secondary | ICD-10-CM | POA: Diagnosis present

## 2018-11-08 DIAGNOSIS — C787 Secondary malignant neoplasm of liver and intrahepatic bile duct: Principal | ICD-10-CM

## 2018-11-08 DIAGNOSIS — D709 Neutropenia, unspecified: Secondary | ICD-10-CM | POA: Insufficient documentation

## 2018-11-08 DIAGNOSIS — Z5189 Encounter for other specified aftercare: Secondary | ICD-10-CM | POA: Diagnosis not present

## 2018-11-08 DIAGNOSIS — D6481 Anemia due to antineoplastic chemotherapy: Secondary | ICD-10-CM | POA: Diagnosis not present

## 2018-11-08 DIAGNOSIS — Z5111 Encounter for antineoplastic chemotherapy: Secondary | ICD-10-CM | POA: Insufficient documentation

## 2018-11-08 DIAGNOSIS — C20 Malignant neoplasm of rectum: Secondary | ICD-10-CM

## 2018-11-08 DIAGNOSIS — I10 Essential (primary) hypertension: Secondary | ICD-10-CM | POA: Diagnosis not present

## 2018-11-08 DIAGNOSIS — Z452 Encounter for adjustment and management of vascular access device: Secondary | ICD-10-CM | POA: Diagnosis not present

## 2018-11-08 DIAGNOSIS — Z7189 Other specified counseling: Secondary | ICD-10-CM

## 2018-11-08 DIAGNOSIS — Z7901 Long term (current) use of anticoagulants: Secondary | ICD-10-CM | POA: Diagnosis not present

## 2018-11-08 DIAGNOSIS — I2699 Other pulmonary embolism without acute cor pulmonale: Secondary | ICD-10-CM | POA: Diagnosis not present

## 2018-11-08 DIAGNOSIS — G893 Neoplasm related pain (acute) (chronic): Secondary | ICD-10-CM | POA: Insufficient documentation

## 2018-11-08 LAB — CBC WITH DIFFERENTIAL (CANCER CENTER ONLY)
Abs Immature Granulocytes: 0.02 10*3/uL (ref 0.00–0.07)
Basophils Absolute: 0 10*3/uL (ref 0.0–0.1)
Basophils Relative: 1 %
EOS PCT: 4 %
Eosinophils Absolute: 0.2 10*3/uL (ref 0.0–0.5)
HCT: 32.2 % — ABNORMAL LOW (ref 39.0–52.0)
Hemoglobin: 10.3 g/dL — ABNORMAL LOW (ref 13.0–17.0)
Immature Granulocytes: 0 %
Lymphocytes Relative: 41 %
Lymphs Abs: 2 10*3/uL (ref 0.7–4.0)
MCH: 31.6 pg (ref 26.0–34.0)
MCHC: 32 g/dL (ref 30.0–36.0)
MCV: 98.8 fL (ref 80.0–100.0)
Monocytes Absolute: 0.8 10*3/uL (ref 0.1–1.0)
Monocytes Relative: 16 %
Neutro Abs: 1.8 10*3/uL (ref 1.7–7.7)
Neutrophils Relative %: 38 %
Platelet Count: 123 10*3/uL — ABNORMAL LOW (ref 150–400)
RBC: 3.26 MIL/uL — ABNORMAL LOW (ref 4.22–5.81)
RDW: 21.8 % — AB (ref 11.5–15.5)
WBC Count: 4.8 10*3/uL (ref 4.0–10.5)
nRBC: 0 % (ref 0.0–0.2)

## 2018-11-08 LAB — CMP (CANCER CENTER ONLY)
ALT: 33 U/L (ref 0–44)
AST: 30 U/L (ref 15–41)
Albumin: 3.3 g/dL — ABNORMAL LOW (ref 3.5–5.0)
Alkaline Phosphatase: 121 U/L (ref 38–126)
Anion gap: 9 (ref 5–15)
BUN: 15 mg/dL (ref 8–23)
CO2: 23 mmol/L (ref 22–32)
Calcium: 8.6 mg/dL — ABNORMAL LOW (ref 8.9–10.3)
Chloride: 110 mmol/L (ref 98–111)
Creatinine: 0.84 mg/dL (ref 0.61–1.24)
GFR, Est AFR Am: >60 mL/min (ref >60)
GFR, Estimated: >60 mL/min (ref >60)
Glucose, Bld: 101 mg/dL — ABNORMAL HIGH (ref 70–99)
Potassium: 3.7 mmol/L (ref 3.5–5.1)
Sodium: 142 mmol/L (ref 135–145)
Total Bilirubin: 0.5 mg/dL (ref 0.3–1.2)
Total Protein: 6.9 g/dL (ref 6.5–8.1)

## 2018-11-08 LAB — TOTAL PROTEIN, URINE DIPSTICK: Protein, ur: NEGATIVE mg/dL

## 2018-11-08 MED ORDER — LEUCOVORIN CALCIUM INJECTION 350 MG
200.0000 mg/m2 | Freq: Once | INTRAVENOUS | Status: AC
  Administered 2018-11-08: 482 mg via INTRAVENOUS
  Filled 2018-11-08: qty 24.1

## 2018-11-08 MED ORDER — PALONOSETRON HCL INJECTION 0.25 MG/5ML
INTRAVENOUS | Status: AC
  Filled 2018-11-08: qty 5

## 2018-11-08 MED ORDER — OXALIPLATIN CHEMO INJECTION 100 MG/20ML
200.0000 mg | Freq: Once | INTRAVENOUS | Status: AC
  Administered 2018-11-08: 200 mg via INTRAVENOUS
  Filled 2018-11-08: qty 40

## 2018-11-08 MED ORDER — SODIUM CHLORIDE 0.9 % IV SOLN
3000.0000 mg/m2 | INTRAVENOUS | Status: DC
  Administered 2018-11-08: 7250 mg via INTRAVENOUS
  Filled 2018-11-08: qty 145

## 2018-11-08 MED ORDER — SODIUM CHLORIDE 0.9 % IV SOLN
Freq: Once | INTRAVENOUS | Status: AC
  Administered 2018-11-08: 10:00:00 via INTRAVENOUS
  Filled 2018-11-08: qty 250

## 2018-11-08 MED ORDER — DEXTROSE 5 % IV SOLN
Freq: Once | INTRAVENOUS | Status: AC
  Administered 2018-11-08: 12:00:00 via INTRAVENOUS
  Filled 2018-11-08: qty 250

## 2018-11-08 MED ORDER — SODIUM CHLORIDE 0.9 % IV SOLN
600.0000 mg | Freq: Once | INTRAVENOUS | Status: AC
  Administered 2018-11-08: 600 mg via INTRAVENOUS
  Filled 2018-11-08: qty 16

## 2018-11-08 MED ORDER — IRINOTECAN HCL CHEMO INJECTION 100 MG/5ML
165.0000 mg/m2 | Freq: Once | INTRAVENOUS | Status: AC
  Administered 2018-11-08: 400 mg via INTRAVENOUS
  Filled 2018-11-08: qty 15

## 2018-11-08 MED ORDER — ATROPINE SULFATE 1 MG/ML IJ SOLN
0.5000 mg | Freq: Once | INTRAMUSCULAR | Status: AC | PRN
  Administered 2018-11-08: 0.5 mg via INTRAVENOUS

## 2018-11-08 MED ORDER — DEXAMETHASONE SODIUM PHOSPHATE 10 MG/ML IJ SOLN
10.0000 mg | Freq: Once | INTRAMUSCULAR | Status: AC
  Administered 2018-11-08: 10 mg via INTRAVENOUS

## 2018-11-08 MED ORDER — DEXAMETHASONE SODIUM PHOSPHATE 10 MG/ML IJ SOLN
INTRAMUSCULAR | Status: AC
  Filled 2018-11-08: qty 1

## 2018-11-08 MED ORDER — ATROPINE SULFATE 1 MG/ML IJ SOLN
INTRAMUSCULAR | Status: AC
  Filled 2018-11-08: qty 1

## 2018-11-08 MED ORDER — PALONOSETRON HCL INJECTION 0.25 MG/5ML
0.2500 mg | Freq: Once | INTRAVENOUS | Status: AC
  Administered 2018-11-08: 0.25 mg via INTRAVENOUS

## 2018-11-08 NOTE — Patient Instructions (Signed)
[facility] Discharge Instructions for Patients Receiving Chemotherapy  Today you received the following chemotherapy agents Bevacizumab (AVASTIN), Irinotecan (CAMPTOSAR), Oxaliplatin (ELOXATIN), Leucovorin & Flourouracil (ADRUCIL).  To help prevent nausea and vomiting after your treatment, we encourage you to take your nausea medication as prescribed.   If you develop nausea and vomiting that is not controlled by your nausea medication, call the clinic.   BELOW ARE SYMPTOMS THAT SHOULD BE REPORTED IMMEDIATELY:  *FEVER GREATER THAN 100.5 F  *CHILLS WITH OR WITHOUT FEVER  NAUSEA AND VOMITING THAT IS NOT CONTROLLED WITH YOUR NAUSEA MEDICATION  *UNUSUAL SHORTNESS OF BREATH  *UNUSUAL BRUISING OR BLEEDING  TENDERNESS IN MOUTH AND THROAT WITH OR WITHOUT PRESENCE OF ULCERS  *URINARY PROBLEMS  *BOWEL PROBLEMS  UNUSUAL RASH Items with * indicate a potential emergency and should be followed up as soon as possible.  Feel free to call the clinic should you have any questions or concerns. The clinic phone number is [phone].  Please show the [facility] at check-in to the Emergency Department and triage nurse.

## 2018-11-09 ENCOUNTER — Other Ambulatory Visit: Payer: Self-pay

## 2018-11-09 DIAGNOSIS — I10 Essential (primary) hypertension: Secondary | ICD-10-CM

## 2018-11-09 MED ORDER — AMLODIPINE BESYLATE 5 MG PO TABS: 5.0000 mg | ORAL_TABLET | Freq: Two times a day (BID) | ORAL | 2 refills | Status: DC

## 2018-11-10 ENCOUNTER — Inpatient Hospital Stay: Payer: BLUE CROSS/BLUE SHIELD

## 2018-11-10 VITALS — BP 133/92 | HR 85 | Temp 98.5°F | Resp 18

## 2018-11-10 DIAGNOSIS — Z7189 Other specified counseling: Secondary | ICD-10-CM

## 2018-11-10 DIAGNOSIS — C20 Malignant neoplasm of rectum: Secondary | ICD-10-CM

## 2018-11-10 DIAGNOSIS — C787 Secondary malignant neoplasm of liver and intrahepatic bile duct: Principal | ICD-10-CM

## 2018-11-10 DIAGNOSIS — Z5112 Encounter for antineoplastic immunotherapy: Secondary | ICD-10-CM | POA: Diagnosis not present

## 2018-11-10 MED ORDER — TBO-FILGRASTIM 480 MCG/0.8ML ~~LOC~~ SOSY
480.0000 ug | PREFILLED_SYRINGE | Freq: Once | SUBCUTANEOUS | Status: AC
  Administered 2018-11-10: 480 ug via SUBCUTANEOUS

## 2018-11-10 MED ORDER — HEPARIN SOD (PORK) LOCK FLUSH 100 UNIT/ML IV SOLN
500.0000 [IU] | Freq: Once | INTRAVENOUS | Status: AC | PRN
  Administered 2018-11-10: 500 [IU]
  Filled 2018-11-10: qty 5

## 2018-11-10 MED ORDER — TBO-FILGRASTIM 480 MCG/0.8ML ~~LOC~~ SOSY
PREFILLED_SYRINGE | SUBCUTANEOUS | Status: AC
  Filled 2018-11-10: qty 0.8

## 2018-11-10 MED ORDER — SODIUM CHLORIDE 0.9% FLUSH
10.0000 mL | INTRAVENOUS | Status: DC | PRN
  Administered 2018-11-10: 10 mL
  Filled 2018-11-10: qty 10

## 2018-11-10 NOTE — Patient Instructions (Signed)
Tbo-Filgrastim injection What is this medicine? TBO-FILGRASTIM (T B O fil GRA stim) is a granulocyte colony-stimulating factor that stimulates the growth of neutrophils, a type of white blood cell important in the body's fight against infection. It is used to reduce the incidence of fever and infection in patients with certain types of cancer who are receiving chemotherapy that affects the bone marrow. This medicine may be used for other purposes; ask your health care provider or pharmacist if you have questions. COMMON BRAND NAME(S): Granix What should I tell my health care provider before I take this medicine? They need to know if you have any of these conditions: -bone scan or tests planned -kidney disease -sickle cell anemia -an unusual or allergic reaction to tbo-filgrastim, filgrastim, pegfilgrastim, other medicines, foods, dyes, or preservatives -pregnant or trying to get pregnant -breast-feeding How should I use this medicine? This medicine is for injection under the skin. If you get this medicine at home, you will be taught how to prepare and give this medicine. Refer to the Instructions for Use that come with your medication packaging. Use exactly as directed. Take your medicine at regular intervals. Do not take your medicine more often than directed. It is important that you put your used needles and syringes in a special sharps container. Do not put them in a trash can. If you do not have a sharps container, call your pharmacist or healthcare provider to get one. Talk to your pediatrician regarding the use of this medicine in children. While this drug may be prescribed for children as young as 1 month of age for selected conditions, precautions do apply. Overdosage: If you think you have taken too much of this medicine contact a poison control center or emergency room at once. NOTE: This medicine is only for you. Do not share this medicine with others. What if I miss a dose? It is  important not to miss your dose. Call your doctor or health care professional if you miss a dose. What may interact with this medicine? This medicine may interact with the following medications: -medicines that may cause a release of neutrophils, such as lithium This list may not describe all possible interactions. Give your health care provider a list of all the medicines, herbs, non-prescription drugs, or dietary supplements you use. Also tell them if you smoke, drink alcohol, or use illegal drugs. Some items may interact with your medicine. What should I watch for while using this medicine? You may need blood work done while you are taking this medicine. What side effects may I notice from receiving this medicine? Side effects that you should report to your doctor or health care professional as soon as possible: -allergic reactions like skin rash, itching or hives, swelling of the face, lips, or tongue -back pain -blood in the urine -dark urine -dizziness -fast heartbeat -feeling faint -shortness of breath or breathing problems -signs and symptoms of infection like fever or chills; cough; or sore throat -signs and symptoms of kidney injury like trouble passing urine or change in the amount of urine -stomach or side pain, or pain at the shoulder -sweating -swelling of the legs, ankles, or abdomen -tiredness Side effects that usually do not require medical attention (report to your doctor or health care professional if they continue or are bothersome): -bone pain -diarrhea -headache -muscle pain -vomiting This list may not describe all possible side effects. Call your doctor for medical advice about side effects. You may report side effects to FDA at   1-800-FDA-1088. Where should I keep my medicine? Keep out of the reach of children. Store in a refrigerator between 2 and 8 degrees C (36 and 46 degrees F). Keep in carton to protect from light. Throw away this medicine if it is left out  of the refrigerator for more than 5 consecutive days. Throw away any unused medicine after the expiration date. NOTE: This sheet is a summary. It may not cover all possible information. If you have questions about this medicine, talk to your doctor, pharmacist, or health care provider.  2019 Elsevier/Gold Standard (2017-05-11 16:56:18)  

## 2018-11-14 ENCOUNTER — Telehealth: Payer: Self-pay | Admitting: Hematology

## 2018-11-14 NOTE — Telephone Encounter (Signed)
Called patient and patient is aware of his appt dates per 01/31 los.

## 2018-11-21 ENCOUNTER — Other Ambulatory Visit: Payer: BLUE CROSS/BLUE SHIELD

## 2018-11-21 ENCOUNTER — Ambulatory Visit: Payer: BLUE CROSS/BLUE SHIELD

## 2018-11-21 ENCOUNTER — Ambulatory Visit: Payer: BLUE CROSS/BLUE SHIELD | Admitting: Hematology

## 2018-11-22 NOTE — Progress Notes (Signed)
Billy Mendoza   Telephone:([phone]) [phone] Fax:(336) [phone]   Clinic Follow up Note   Patient Care Team: Billy Proud, MD as PCP - General (Internal Medicine) Billy Merle, MD as Consulting Physician (Hematology) Billy Ada, MD as Consulting Physician (Gastroenterology) 11/24/2018  CHIEF COMPLAINT: F/u of rectal cancer metastasized to liver   SUMMARY OF ONCOLOGIC HISTORY: Oncology History   Cancer Staging Rectal cancer metastasized to liver [facility]) Staging form: Colon and Rectum, AJCC 8th Edition - Clinical stage from 07/28/2018: Stage IVB (cTX, cNX, pM1b) - Signed by Billy Merle, MD on 08/18/2018       Rectal cancer metastasized to liver ([place])   07/28/2018 Initial Biopsy    Biopsy 07/28/18 Final Microscopic Diagnosis A.Colon, Ascending, Polyp:  -Fragments of tubular adenoma -No high-grade dysplasia of malignancy.  B. Rectum, Mass, Polyp:  -Invasive adenocarcinoma, moderately - poorly differentiatred.     07/28/2018 Cancer Staging    Staging form: Colon and Rectum, AJCC 8th Edition - Clinical stage from 07/28/2018: Stage IVB (cTX, cNX, pM1b) - Signed by Billy Merle, MD on 08/18/2018    08/04/2018 Initial Diagnosis    Rectal cancer metastasized to liver ([place])    08/16/2018 Pathology Results    Biospy  Diagnosis 08/16/18 Liver, needle/core biopsy - ADENOCARCINOMA. Microscopic Comment By morphology, the findings are compatible with the provided clinical history of colorectal adenocarcinoma. Results reported to Dr. Truitt Mendoza on 08/18/2018. Intradepartmental consultation was obtained (Billy Mendoza).     08/16/2018 Miscellaneous    Foundation 1 genomic testing: MS-stable Tumor mutation burden  3/Mb K-ras/NRAS wild-type BRAF V600 E KDMSA R266Q PTEN T340f* SMAD4 loss TP53 loss      08/18/2018 Procedure    Colonoscopy by Dr. HBenson Mendoza malignant appearing partially obstructing tumor in the rectum, 8 cm above anal verge.    08/18/2018 - 09/07/2018  Chemotherapy    CAPOX q3weeks with Xeloda 20010mBID 2 weeks on, 1 weeks off starting 08/18/18.  Swithced treatment due to BRAF mutation.     09/07/2018 -  Chemotherapy    FOLFOXIRI and avastin every 2 weeks starting 09/07/18    11/04/2018 Imaging    CT CAP 11/04/18 IMPRESSION: 1. Peripheral filling defect in the right main pulmonary artery extending primarily into the right lower lobe pulmonary artery. While the peripheral location indicates some degree of chronicity of this pulmonary embolus, this is a new finding compared to 07/28/2018. The right ventricular to left ventricular ratio is 0.7, which does not specifically indicate right heart strain. 2. The malignancy appears considerably improved, with reduced size pulmonary nodules; reduced size and increased internal necrosis of the hepatic metastatic lesions; marked improvement in the thoracic adenopathy; reduced conspicuity of the rectal tumor; and significant reduction in perirectal tumor burden/adenopathy. 3. Other imaging findings of potential clinical significance: Aortic Atherosclerosis (ICD10-I70.0). Coronary atherosclerosis. Bilateral renal cysts. Small umbilical hernia contains adipose tissue.  Critical Value/emergent results were called by telephone at the time of interpretation on 11/04/2018 at 3:05 pm to Dr. YATruitt Mendoza who verbally acknowledged these results.     CURRENT THERAPY  FOLFOXIRI andAvastinevery 2 weeks starting 09/07/18  INTERVAL HISTORY: [name] a 68.o. male who is here for follow-up. Since last visit, he started Xarelto for his right PE. Today, he is here alone and is doing well. [facility] [facility] [facility]. After chemotherapy, he experiences cold sensitivity on his fingers, decrease in appetite but it improves as time progresses. The coldness from his fingers  improves after about 7-9 days. His energy levels are fine, and is able to go to work. Denies, fever, cp,  bleeding or stomach pain. Today, he has mild numbness on his fingers.   Pertinent positives and negatives of review of systems are listed and detailed within the above HPI.  REVIEW OF SYSTEMS:  Constitutional: Denies fevers, chills or abnormal weight loss Eyes: Denies blurriness of vision Ears, nose, mouth, throat, and face: Denies mucositis or sore throat Respiratory: Denies cough, dyspnea or wheezes Cardiovascular: Denies palpitation, chest discomfort or lower extremity swelling Gastrointestinal:  Denies nausea, heartburn or change in bowel habits Skin: Denies abnormal skin rashes Lymphatics: Denies new lymphadenopathy or easy bruising Neurological: New weaknesses, (+) mild numbness in fingers Behavioral/Psych: Mood is stable, no new changes  All other systems were reviewed with the patient and are negative.  MEDICAL HISTORY:  History reviewed. No pertinent past medical history.  SURGICAL HISTORY: Past Surgical History:  Procedure Laterality Date  . IR IMAGING GUIDED PORT INSERTION  08/16/2018  . IR US GUIDE BX ASP/DRAIN  08/16/2018    I have reviewed the social history and family history with the patient and they are unchanged from previous note.  ALLERGIES:  has No Known Allergies.  MEDICATIONS:  Current Outpatient Medications  Medication Sig Dispense Refill  . amLODipine (NORVASC) 5 MG tablet Take 1 tablet (5 mg total) by mouth 2 (two) times daily. 60 tablet 2  . diphenoxylate-atropine (LOMOTIL) 2.5-0.025 MG tablet Take 1-2 tablets by mouth 4 (four) times daily as needed for diarrhea or loose stools. Up to 8 tabs daily for severe diarrhea 60 tablet 2  . ibuprofen (ADVIL,MOTRIN) 200 MG tablet Take 200 mg by mouth every 6 (six) hours as needed.    . potassium chloride SA (K-DUR,KLOR-CON) 20 MEQ tablet Take 1 tablet (20 mEq total) by mouth daily. 30 tablet 1  . Rivaroxaban 15 & 20 MG TBPK Take as directed on package: Start with one 55m tablet by mouth twice a day with food.  On Day 22, switch to one 224mtablet once a day with food. 51 each 0   Current Facility-Administered Medications  Medication Dose Route Frequency Provider Last Rate Last Dose  . 0.9 %  sodium chloride infusion   Intravenous Once FeTruitt MerleMD      . alteplase (CATHFLO ACTIVASE) injection 2 mg  2 mg Intracatheter Once PRN FeTruitt MerleMD      . alteplase (CATHFLO ACTIVASE) injection 2 mg  2 mg Intracatheter Once PRN FeTruitt MerleMD      . heparin lock flush 100 unit/mL  500 Units Intracatheter Once PRN FeTruitt MerleMD      . heparin lock flush 100 unit/mL  250 Units Intracatheter Once PRN FeTruitt MerleMD      . sodium chloride flush (NS) 0.9 % injection 10 mL  10 mL Intracatheter PRN FeTruitt MerleMD      . sodium chloride flush (NS) 0.9 % injection 10 mL  10 mL Intracatheter Once PRN FeTruitt MerleMD      . sodium chloride flush (NS) 0.9 % injection 3 mL  3 mL Intracatheter Once PRN FeTruitt MerleMD        PHYSICAL EXAMINATION: ECOG PERFORMANCE STATUS: 1 - Symptomatic but completely ambulatory  Vitals:   11/24/18 0839  BP: (!) 148/92  Pulse: 86  Resp: 17  Temp: 98.6 F (37 C)  SpO2: 99%   Filed Weights   11/24/18 0839  Weight: 244 lb  4.8 oz (110.8 kg)    GENERAL:alert, no distress and comfortable SKIN: skin color, texture, turgor are normal, no rashes or significant lesions EYES: normal, Conjunctiva are pink and non-injected, sclera clear OROPHARYNX:no exudate, no erythema and lips, buccal mucosa, and tongue normal  NECK: supple, thyroid normal size, non-tender, without nodularity LYMPH:  no palpable lymphadenopathy in the cervical, axillary or inguinal LUNGS: clear to auscultation and percussion with normal breathing effort HEART: regular rate & rhythm and no murmurs and no lower extremity edema ABDOMEN:abdomen soft, non-tender and normal bowel sounds Musculoskeletal:no cyanosis of digits and no clubbing  NEURO: alert & oriented x 3 with fluent speech, no focal motor/sensory  deficits  LABORATORY DATA:  I have reviewed the data as listed CBC Latest Ref Rng & Units 11/24/2018 11/08/2018 10/19/2018  WBC 4.0 - 10.5 K/uL 3.5(L) 4.8 3.9(L)  Hemoglobin 13.0 - 17.0 g/dL 9.7(L) 10.3(L) 9.9(L)  Hematocrit 39.0 - 52.0 % 30.5(L) 32.2(L) 30.8(L)  Platelets 150 - 400 K/uL 141(L) 123(L) 160     CMP Latest Ref Rng & Units 11/24/2018 11/08/2018 10/19/2018  Glucose 70 - 99 mg/dL 109(H) 101(H) 97  BUN 8 - 23 mg/dL _0 Creatinine 0.61 - 1.24 mg/dL 0.89 0.84 0.79  Sodium 135 - 145 mmol/L 139 142 142  Potassium 3.5 - 5.1 mmol/L 3.5 3.7 3.2(L)  Chloride 98 - 111 mmol/L 110 110 111  CO2 22 - 32 mmol/L 21(L) 23 22  Calcium 8.9 - 10.3 mg/dL 8.6(L) 8.6(L) 8.5(L)  Total Protein 6.5 - 8.1 g/dL 6.7 6.9 7.0  Total Bilirubin 0.3 - 1.2 mg/dL 0.3 0.5 0.4  Alkaline Phos 38 - 126 U/L 121 121 112  AST 15 - 41 U/L _1 ALT 0 - 44 U/L 34 33 20      RADIOGRAPHIC STUDIES: I have personally reviewed the radiological images as listed and agreed with the findings in the report. No results found.   ASSESSMENT & PLAN:  Billy Mendoza is a 68 y.o. male with history of   1. Proximal rectal adenocarcinoma, metastatic to liver and likely tolungsandnodes, MSS,KRAS/NRAS wild type,BRAF V600E mutation (+) -He was diagnosed in10/2019.He completed 1 cycle of CAPOX before changing to FOLFOXIRI andAvastindue to BRAF mutation in his tumor.  - Hismetastatic diseaseis not curable, he will continue treatment for as long as he can tolerate to control disease.  -He continues to tolerate FOLFOXIRI moderately well and last restaging CT CAP on 11/04/2018 showed great partial response to treatment.  -Patient again asked for duration of chemotherapy.  If she develops more side effects, especially neuropathy from oxaliplatin, we can change his treatment to FOLFIRI.  If he continues to have excellent response after 9 to 12 months chemo, we can also consider change to maintenance therapy with 5-FU or  Xeloda with Avastin. -Lab reviewed, he has significant neutropenia with ANC 0.5, will postpone his chemo to next week.  We will give him Granix injection today and again in 2 days. -I will change Granix to [place] if his insurance approves, giving on day 3 with pump DC -Continue current dose chemotherapy every 2 weeks  2. Right PE -His PE is likely related to his underlying metastatic colon cancer and chemotherapy, especially Avastin. -CT CAP from 11/04/2018 showed right main Pulmonary artery PE, appears to be subacute or chronic, may have been present for up to a month. He is completely asymptomatic, no leg edema. No evidence of right heart strain.  -He is tolerating Xarelto well, currently on  maintenance dose 20 mg daily, will continue indefinitely.  2. Rectal pain and bleeding, constipation  -He uses stool softeners and laxatives for his constipation. Uses Ibuprofen as needed. -With treatment his pain and constipation is minimal now.   3. Anemiasecondary tocancer and chemotherapy -Labs reviewed, hemoglobin 9.7, overall stable, no need blood transfusion.  4. HTN  -He notes BP at home has been increasing to 130-140 range. He notes to being stressed lately due to father's passing -Takes  Amlodipine  - BP today at 148/92 (11/24/17), overall controlled.  5. Goal of care discussion -He is full code now -He understands his disease is incurable,and the goal of therapy is palliative to prolong his life.   Plan  - Cancel infusion today due to neutropenia  - Lab, flush and chemo FOLFOXFIRI and avastin next week  then every 2 weeks X4 - F/u with me before each treatment except next week  - Tbo-Filgrastim injection today, and one on Saturday then change to Udenyca injection on day 3     No problem-specific Assessment & Plan notes found for this encounter.   Orders Placed This Encounter  Procedures  . SCHEDULING COMMUNICATION INJECTION    Schedule 30 minute port flush appointment   . SCHEDULING COMMUNICATION    Please schedule 3 hour infusion visit for fluids and hydration  . Treatment conditions    Standing Status:   Standing    Number of Occurrences:   1    Order Specific Question:   Did you provide treatment parameters in the comments section?    Answer:   Yes  . [facility]    Lab Appointment 15 Minutes  . SCHEDULING COMMUNICATION INJECTION    Injection Appt 1 hr.  . Treatment conditions    Provider to add parameters.    Standing Status:   Standing    Number of Occurrences:   1    Order Specific Question:   Did you provide treatment parameters in the comments section?    Answer:   Yes   All questions were answered. The patient knows to call the clinic with any problems, questions or concerns. No barriers to learning was detected. I spent 25 minutes counseling the patient face to face. The total time spent in the appointment was 30 minutes and more than 50% was on counseling and review of test results  I, Manson Allan am acting as scribe for Dr. Truitt Mendoza.  I have reviewed the above documentation for accuracy and completeness, and I agree with the above.     Billy Merle, MD 11/24/2018

## 2018-11-24 ENCOUNTER — Inpatient Hospital Stay: Payer: BLUE CROSS/BLUE SHIELD

## 2018-11-24 ENCOUNTER — Encounter: Payer: Self-pay | Admitting: Hematology

## 2018-11-24 ENCOUNTER — Inpatient Hospital Stay (HOSPITAL_BASED_OUTPATIENT_CLINIC_OR_DEPARTMENT_OTHER): Payer: BLUE CROSS/BLUE SHIELD | Admitting: Hematology

## 2018-11-24 ENCOUNTER — Telehealth: Payer: Self-pay | Admitting: Hematology

## 2018-11-24 VITALS — BP 148/92 | HR 86 | Temp 98.6°F | Resp 17 | Ht 73.0 in | Wt 244.3 lb

## 2018-11-24 DIAGNOSIS — C20 Malignant neoplasm of rectum: Secondary | ICD-10-CM

## 2018-11-24 DIAGNOSIS — Z7901 Long term (current) use of anticoagulants: Secondary | ICD-10-CM

## 2018-11-24 DIAGNOSIS — Z95828 Presence of other vascular implants and grafts: Secondary | ICD-10-CM

## 2018-11-24 DIAGNOSIS — C787 Secondary malignant neoplasm of liver and intrahepatic bile duct: Principal | ICD-10-CM

## 2018-11-24 DIAGNOSIS — I2699 Other pulmonary embolism without acute cor pulmonale: Secondary | ICD-10-CM

## 2018-11-24 DIAGNOSIS — D6481 Anemia due to antineoplastic chemotherapy: Secondary | ICD-10-CM | POA: Diagnosis not present

## 2018-11-24 DIAGNOSIS — I10 Essential (primary) hypertension: Secondary | ICD-10-CM

## 2018-11-24 DIAGNOSIS — G893 Neoplasm related pain (acute) (chronic): Secondary | ICD-10-CM | POA: Diagnosis not present

## 2018-11-24 DIAGNOSIS — D709 Neutropenia, unspecified: Secondary | ICD-10-CM | POA: Diagnosis not present

## 2018-11-24 DIAGNOSIS — D500 Iron deficiency anemia secondary to blood loss (chronic): Secondary | ICD-10-CM

## 2018-11-24 DIAGNOSIS — Z5112 Encounter for antineoplastic immunotherapy: Secondary | ICD-10-CM | POA: Diagnosis not present

## 2018-11-24 LAB — CMP (CANCER CENTER ONLY)
ALK PHOS: 121 U/L (ref 38–126)
ALT: 34 U/L (ref 0–44)
AST: 31 U/L (ref 15–41)
Albumin: 3.2 g/dL — ABNORMAL LOW (ref 3.5–5.0)
Anion gap: 8 (ref 5–15)
BUN: 14 mg/dL (ref 8–23)
CALCIUM: 8.6 mg/dL — AB (ref 8.9–10.3)
CO2: 21 mmol/L — ABNORMAL LOW (ref 22–32)
Chloride: 110 mmol/L (ref 98–111)
Creatinine: 0.89 mg/dL (ref 0.61–1.24)
GFR, Est AFR Am: >60 mL/min (ref >60)
GFR, Estimated: >60 mL/min (ref >60)
Glucose, Bld: 109 mg/dL — ABNORMAL HIGH (ref 70–99)
Potassium: 3.5 mmol/L (ref 3.5–5.1)
Sodium: 139 mmol/L (ref 135–145)
Total Bilirubin: 0.3 mg/dL (ref 0.3–1.2)
Total Protein: 6.7 g/dL (ref 6.5–8.1)

## 2018-11-24 LAB — CBC WITH DIFFERENTIAL (CANCER CENTER ONLY)
Abs Immature Granulocytes: 0.01 10*3/uL (ref 0.00–0.07)
Basophils Absolute: 0 10*3/uL (ref 0.0–0.1)
Basophils Relative: 1 %
Eosinophils Absolute: 0.2 10*3/uL (ref 0.0–0.5)
Eosinophils Relative: 5 %
HCT: 30.5 % — ABNORMAL LOW (ref 39.0–52.0)
Hemoglobin: 9.7 g/dL — ABNORMAL LOW (ref 13.0–17.0)
Immature Granulocytes: 0 %
Lymphocytes Relative: 59 %
Lymphs Abs: 2.1 10*3/uL (ref 0.7–4.0)
MCH: 31.7 pg (ref 26.0–34.0)
MCHC: 31.8 g/dL (ref 30.0–36.0)
MCV: 99.7 fL (ref 80.0–100.0)
Monocytes Absolute: 0.8 10*3/uL (ref 0.1–1.0)
Monocytes Relative: 22 %
Neutro Abs: 0.5 10*3/uL — ABNORMAL LOW (ref 1.7–7.7)
Neutrophils Relative %: 13 %
Platelet Count: 141 10*3/uL — ABNORMAL LOW (ref 150–400)
RBC: 3.06 MIL/uL — ABNORMAL LOW (ref 4.22–5.81)
RDW: 21.2 % — ABNORMAL HIGH (ref 11.5–15.5)
WBC Count: 3.5 10*3/uL — ABNORMAL LOW (ref 4.0–10.5)
nRBC: 0 % (ref 0.0–0.2)

## 2018-11-24 LAB — TOTAL PROTEIN, URINE DIPSTICK: Protein, ur: <30 mg/dL

## 2018-11-24 LAB — CEA (IN HOUSE-CHCC): CEA (CHCC-In House): 27.89 ng/mL — ABNORMAL HIGH (ref 0.00–5.00)

## 2018-11-24 MED ORDER — SODIUM CHLORIDE 0.9% FLUSH
10.0000 mL | Freq: Once | INTRAVENOUS | Status: DC | PRN
  Filled 2018-11-24: qty 10

## 2018-11-24 MED ORDER — HEPARIN SOD (PORK) LOCK FLUSH 100 UNIT/ML IV SOLN
500.0000 [IU] | Freq: Once | INTRAVENOUS | Status: DC | PRN
  Filled 2018-11-24: qty 5

## 2018-11-24 MED ORDER — HEPARIN SOD (PORK) LOCK FLUSH 100 UNIT/ML IV SOLN
250.0000 [IU] | Freq: Once | INTRAVENOUS | Status: DC | PRN
  Filled 2018-11-24: qty 5

## 2018-11-24 MED ORDER — SODIUM CHLORIDE 0.9% FLUSH
10.0000 mL | INTRAVENOUS | Status: DC | PRN
  Administered 2018-11-24: 10 mL via INTRAVENOUS
  Filled 2018-11-24: qty 10

## 2018-11-24 MED ORDER — ALTEPLASE 2 MG IJ SOLR
2.0000 mg | Freq: Once | INTRAMUSCULAR | Status: DC | PRN
  Filled 2018-11-24: qty 2

## 2018-11-24 MED ORDER — HEPARIN SOD (PORK) LOCK FLUSH 100 UNIT/ML IV SOLN
500.0000 [IU] | Freq: Once | INTRAVENOUS | Status: AC | PRN
  Administered 2018-11-24: 500 [IU]
  Filled 2018-11-24: qty 5

## 2018-11-24 MED ORDER — TBO-FILGRASTIM 480 MCG/0.8ML ~~LOC~~ SOSY
PREFILLED_SYRINGE | SUBCUTANEOUS | Status: AC
  Filled 2018-11-24: qty 0.8

## 2018-11-24 MED ORDER — TBO-FILGRASTIM 480 MCG/0.8ML ~~LOC~~ SOSY
480.0000 ug | PREFILLED_SYRINGE | Freq: Once | SUBCUTANEOUS | Status: AC
  Administered 2018-11-24: 480 ug via SUBCUTANEOUS

## 2018-11-24 MED ORDER — SODIUM CHLORIDE 0.9 % IV SOLN
Freq: Once | INTRAVENOUS | Status: DC
  Filled 2018-11-24: qty 250

## 2018-11-24 MED ORDER — SODIUM CHLORIDE 0.9% FLUSH
10.0000 mL | INTRAVENOUS | Status: DC | PRN
  Filled 2018-11-24: qty 10

## 2018-11-24 MED ORDER — SODIUM CHLORIDE 0.9% FLUSH
3.0000 mL | Freq: Once | INTRAVENOUS | Status: DC | PRN
  Filled 2018-11-24: qty 10

## 2018-11-24 NOTE — Telephone Encounter (Signed)
Scheduled appt per 2/20 los.  Printed calendar and avs.  Added treatment in the book for 2/27.  Patient wanted his appts on Tuesdays or thursdays, so we went with thursdays.

## 2018-11-24 NOTE — Addendum Note (Signed)
Addended by: Truitt Merle on: 11/24/2018 04:49 PM   Modules accepted: Orders

## 2018-11-25 ENCOUNTER — Other Ambulatory Visit: Payer: Self-pay

## 2018-11-25 ENCOUNTER — Other Ambulatory Visit: Payer: Self-pay | Admitting: Hematology

## 2018-11-25 ENCOUNTER — Telehealth: Payer: Self-pay

## 2018-11-25 LAB — IRON AND TIBC
Iron: 78 ug/dL (ref 42–163)
Saturation Ratios: 22 % (ref 20–55)
TIBC: 354 ug/dL (ref 202–409)
UIBC: 276 ug/dL (ref 117–376)

## 2018-11-25 LAB — FERRITIN: Ferritin: 109 ng/mL (ref 24–336)

## 2018-11-25 NOTE — Telephone Encounter (Signed)
Spoke with patient regarding appointment for Granix injection on Saturday 2/22 at 10:00, he is unable to come at 29, but can come at 1, scheduling message was sent and for future Granix injections.  Explained to patient insurance would not approve Neulasta.  He verbalized an understanding.

## 2018-11-26 ENCOUNTER — Inpatient Hospital Stay: Payer: BLUE CROSS/BLUE SHIELD

## 2018-11-26 VITALS — BP 125/94 | HR 80 | Temp 98.0°F | Resp 18

## 2018-11-26 DIAGNOSIS — Z95828 Presence of other vascular implants and grafts: Secondary | ICD-10-CM

## 2018-11-26 DIAGNOSIS — Z5112 Encounter for antineoplastic immunotherapy: Secondary | ICD-10-CM | POA: Diagnosis not present

## 2018-11-26 MED ORDER — TBO-FILGRASTIM 480 MCG/0.8ML ~~LOC~~ SOSY
PREFILLED_SYRINGE | SUBCUTANEOUS | Status: AC
  Filled 2018-11-26: qty 0.8

## 2018-11-26 MED ORDER — TBO-FILGRASTIM 480 MCG/0.8ML ~~LOC~~ SOSY
480.0000 ug | PREFILLED_SYRINGE | Freq: Once | SUBCUTANEOUS | Status: AC
  Administered 2018-11-26: 480 ug via SUBCUTANEOUS

## 2018-11-28 ENCOUNTER — Telehealth: Payer: Self-pay

## 2018-11-28 ENCOUNTER — Telehealth: Payer: Self-pay | Admitting: Hematology

## 2018-11-28 NOTE — Telephone Encounter (Signed)
Called patient to inform the patient that their treatment has been added for 02/2.  Patient stated he was having symptoms from his treatment.  Transferred call to the MD nurse and told the patient to leave their name and number and the concern they are having.

## 2018-11-28 NOTE — Telephone Encounter (Signed)
Per Dr. Burr Medico recommended patient take OTC B-complex for numbness in fingertips, keep hands warm, exercise, patient verbalized an understanding.

## 2018-11-28 NOTE — Telephone Encounter (Signed)
Patient calls to let Dr. Burr Medico know starting to have some numbness in his fingertips. States it is not impairing but noticed it especially when he plays the guitar.  [phone]

## 2018-11-30 ENCOUNTER — Inpatient Hospital Stay: Payer: BLUE CROSS/BLUE SHIELD

## 2018-11-30 DIAGNOSIS — Z95828 Presence of other vascular implants and grafts: Secondary | ICD-10-CM

## 2018-11-30 DIAGNOSIS — Z5112 Encounter for antineoplastic immunotherapy: Secondary | ICD-10-CM | POA: Diagnosis not present

## 2018-11-30 DIAGNOSIS — C20 Malignant neoplasm of rectum: Secondary | ICD-10-CM

## 2018-11-30 DIAGNOSIS — C787 Secondary malignant neoplasm of liver and intrahepatic bile duct: Principal | ICD-10-CM

## 2018-11-30 LAB — CMP (CANCER CENTER ONLY)
ALT: 58 U/L — AB (ref 0–44)
AST: 43 U/L — AB (ref 15–41)
Albumin: 3.4 g/dL — ABNORMAL LOW (ref 3.5–5.0)
Alkaline Phosphatase: 172 U/L — ABNORMAL HIGH (ref 38–126)
Anion gap: 10 (ref 5–15)
BUN: 17 mg/dL (ref 8–23)
CO2: 23 mmol/L (ref 22–32)
Calcium: 8.7 mg/dL — ABNORMAL LOW (ref 8.9–10.3)
Chloride: 109 mmol/L (ref 98–111)
Creatinine: 0.98 mg/dL (ref 0.61–1.24)
GFR, Est AFR Am: >60 mL/min (ref >60)
GFR, Estimated: >60 mL/min (ref >60)
Glucose, Bld: 117 mg/dL — ABNORMAL HIGH (ref 70–99)
Potassium: 4.1 mmol/L (ref 3.5–5.1)
Sodium: 142 mmol/L (ref 135–145)
Total Bilirubin: 0.3 mg/dL (ref 0.3–1.2)
Total Protein: 7.5 g/dL (ref 6.5–8.1)

## 2018-11-30 LAB — CBC WITH DIFFERENTIAL (CANCER CENTER ONLY)
ABS IMMATURE GRANULOCYTES: 0.22 10*3/uL — AB (ref 0.00–0.07)
BASOS ABS: 0.1 10*3/uL (ref 0.0–0.1)
Basophils Relative: 1 %
EOS ABS: 0.1 10*3/uL (ref 0.0–0.5)
Eosinophils Relative: 2 %
HCT: 32.2 % — ABNORMAL LOW (ref 39.0–52.0)
Hemoglobin: 10.6 g/dL — ABNORMAL LOW (ref 13.0–17.0)
IMMATURE GRANULOCYTES: 5 %
Lymphocytes Relative: 38 %
Lymphs Abs: 1.9 10*3/uL (ref 0.7–4.0)
MCH: 33.8 pg (ref 26.0–34.0)
MCHC: 32.9 g/dL (ref 30.0–36.0)
MCV: 102.5 fL — ABNORMAL HIGH (ref 80.0–100.0)
Monocytes Absolute: 0.9 10*3/uL (ref 0.1–1.0)
Monocytes Relative: 18 %
Neutro Abs: 1.7 10*3/uL (ref 1.7–7.7)
Neutrophils Relative %: 36 %
Platelet Count: 192 10*3/uL (ref 150–400)
RBC: 3.14 MIL/uL — ABNORMAL LOW (ref 4.22–5.81)
RDW: 22.6 % — ABNORMAL HIGH (ref 11.5–15.5)
WBC Count: 4.8 10*3/uL (ref 4.0–10.5)
nRBC: 0.8 % — ABNORMAL HIGH (ref 0.0–0.2)

## 2018-11-30 MED ORDER — HEPARIN SOD (PORK) LOCK FLUSH 100 UNIT/ML IV SOLN
500.0000 [IU] | Freq: Once | INTRAVENOUS | Status: AC | PRN
  Administered 2018-11-30: 500 [IU]
  Filled 2018-11-30: qty 5

## 2018-11-30 MED ORDER — SODIUM CHLORIDE 0.9% FLUSH
10.0000 mL | INTRAVENOUS | Status: DC | PRN
  Administered 2018-11-30: 10 mL
  Filled 2018-11-30: qty 10

## 2018-12-01 ENCOUNTER — Inpatient Hospital Stay: Payer: BLUE CROSS/BLUE SHIELD

## 2018-12-01 VITALS — BP 148/90 | HR 83 | Temp 98.5°F | Resp 17

## 2018-12-01 DIAGNOSIS — C20 Malignant neoplasm of rectum: Secondary | ICD-10-CM

## 2018-12-01 DIAGNOSIS — Z5112 Encounter for antineoplastic immunotherapy: Secondary | ICD-10-CM | POA: Diagnosis not present

## 2018-12-01 DIAGNOSIS — C787 Secondary malignant neoplasm of liver and intrahepatic bile duct: Principal | ICD-10-CM

## 2018-12-01 DIAGNOSIS — Z7189 Other specified counseling: Secondary | ICD-10-CM

## 2018-12-01 MED ORDER — DEXTROSE 5 % IV SOLN
Freq: Once | INTRAVENOUS | Status: AC
  Administered 2018-12-01: 10:00:00 via INTRAVENOUS
  Filled 2018-12-01: qty 250

## 2018-12-01 MED ORDER — OXALIPLATIN CHEMO INJECTION 100 MG/20ML
83.0000 mg/m2 | Freq: Once | INTRAVENOUS | Status: AC
  Administered 2018-12-01: 200 mg via INTRAVENOUS
  Filled 2018-12-01: qty 40

## 2018-12-01 MED ORDER — SODIUM CHLORIDE 0.9 % IV SOLN
3000.0000 mg/m2 | INTRAVENOUS | Status: DC
  Administered 2018-12-01: 7250 mg via INTRAVENOUS
  Filled 2018-12-01: qty 145

## 2018-12-01 MED ORDER — LEUCOVORIN CALCIUM INJECTION 350 MG
200.0000 mg/m2 | Freq: Once | INTRAVENOUS | Status: AC
  Administered 2018-12-01: 482 mg via INTRAVENOUS
  Filled 2018-12-01: qty 24.1

## 2018-12-01 MED ORDER — SODIUM CHLORIDE 0.9% FLUSH
10.0000 mL | INTRAVENOUS | Status: DC | PRN
  Filled 2018-12-01: qty 10

## 2018-12-01 MED ORDER — DEXAMETHASONE SODIUM PHOSPHATE 10 MG/ML IJ SOLN
10.0000 mg | Freq: Once | INTRAMUSCULAR | Status: AC
  Administered 2018-12-01: 10 mg via INTRAVENOUS

## 2018-12-01 MED ORDER — SODIUM CHLORIDE 0.9 % IV SOLN
600.0000 mg | Freq: Once | INTRAVENOUS | Status: AC
  Administered 2018-12-01: 600 mg via INTRAVENOUS
  Filled 2018-12-01: qty 16

## 2018-12-01 MED ORDER — ATROPINE SULFATE 1 MG/ML IJ SOLN
INTRAMUSCULAR | Status: AC
  Filled 2018-12-01: qty 1

## 2018-12-01 MED ORDER — PALONOSETRON HCL INJECTION 0.25 MG/5ML
INTRAVENOUS | Status: AC
  Filled 2018-12-01: qty 5

## 2018-12-01 MED ORDER — SODIUM CHLORIDE 0.9 % IV SOLN
Freq: Once | INTRAVENOUS | Status: AC
  Administered 2018-12-01: 09:00:00 via INTRAVENOUS
  Filled 2018-12-01: qty 250

## 2018-12-01 MED ORDER — PALONOSETRON HCL INJECTION 0.25 MG/5ML
0.2500 mg | Freq: Once | INTRAVENOUS | Status: AC
  Administered 2018-12-01: 0.25 mg via INTRAVENOUS

## 2018-12-01 MED ORDER — IRINOTECAN HCL CHEMO INJECTION 100 MG/5ML
165.0000 mg/m2 | Freq: Once | INTRAVENOUS | Status: AC
  Administered 2018-12-01: 400 mg via INTRAVENOUS
  Filled 2018-12-01: qty 5

## 2018-12-01 MED ORDER — DEXAMETHASONE SODIUM PHOSPHATE 10 MG/ML IJ SOLN
INTRAMUSCULAR | Status: AC
  Filled 2018-12-01: qty 1

## 2018-12-01 MED ORDER — ATROPINE SULFATE 1 MG/ML IJ SOLN
0.5000 mg | Freq: Once | INTRAMUSCULAR | Status: AC | PRN
  Administered 2018-12-01: 0.5 mg via INTRAVENOUS

## 2018-12-01 NOTE — Patient Instructions (Signed)
[facility] Discharge Instructions for Patients Receiving Chemotherapy  Today you received the following chemotherapy agents Bevacizumab (AVASTIN), Irinotecan (CAMPTOSAR), Oxaliplatin (ELOXATIN), Leucovorin & Flourouracil (ADRUCIL).  To help prevent nausea and vomiting after your treatment, we encourage you to take your nausea medication as prescribed.   If you develop nausea and vomiting that is not controlled by your nausea medication, call the clinic.   BELOW ARE SYMPTOMS THAT SHOULD BE REPORTED IMMEDIATELY:  *FEVER GREATER THAN 100.5 F  *CHILLS WITH OR WITHOUT FEVER  NAUSEA AND VOMITING THAT IS NOT CONTROLLED WITH YOUR NAUSEA MEDICATION  *UNUSUAL SHORTNESS OF BREATH  *UNUSUAL BRUISING OR BLEEDING  TENDERNESS IN MOUTH AND THROAT WITH OR WITHOUT PRESENCE OF ULCERS  *URINARY PROBLEMS  *BOWEL PROBLEMS  UNUSUAL RASH Items with * indicate a potential emergency and should be followed up as soon as possible.  Feel free to call the clinic should you have any questions or concerns. The clinic phone number is [phone].  Please show the [facility] at check-in to the Emergency Department and triage nurse.

## 2018-12-03 ENCOUNTER — Inpatient Hospital Stay: Payer: BLUE CROSS/BLUE SHIELD

## 2018-12-03 VITALS — BP 139/92 | HR 84 | Temp 97.8°F | Resp 18

## 2018-12-03 DIAGNOSIS — Z7189 Other specified counseling: Secondary | ICD-10-CM

## 2018-12-03 DIAGNOSIS — C787 Secondary malignant neoplasm of liver and intrahepatic bile duct: Principal | ICD-10-CM

## 2018-12-03 DIAGNOSIS — C20 Malignant neoplasm of rectum: Secondary | ICD-10-CM

## 2018-12-03 DIAGNOSIS — Z5112 Encounter for antineoplastic immunotherapy: Secondary | ICD-10-CM | POA: Diagnosis not present

## 2018-12-03 MED ORDER — HEPARIN SOD (PORK) LOCK FLUSH 100 UNIT/ML IV SOLN
500.0000 [IU] | Freq: Once | INTRAVENOUS | Status: AC | PRN
  Administered 2018-12-03: 500 [IU]
  Filled 2018-12-03: qty 5

## 2018-12-03 MED ORDER — SODIUM CHLORIDE 0.9% FLUSH
10.0000 mL | INTRAVENOUS | Status: DC | PRN
  Administered 2018-12-03: 10 mL
  Filled 2018-12-03: qty 10

## 2018-12-03 MED ORDER — TBO-FILGRASTIM 480 MCG/0.8ML ~~LOC~~ SOSY
PREFILLED_SYRINGE | SUBCUTANEOUS | Status: AC
  Filled 2018-12-03: qty 0.8

## 2018-12-03 MED ORDER — TBO-FILGRASTIM 480 MCG/0.8ML ~~LOC~~ SOSY
480.0000 ug | PREFILLED_SYRINGE | Freq: Once | SUBCUTANEOUS | Status: AC
  Administered 2018-12-03: 480 ug via SUBCUTANEOUS

## 2018-12-03 NOTE — Patient Instructions (Signed)
Tbo-Filgrastim injection What is this medicine? TBO-FILGRASTIM (T B O fil GRA stim) is a granulocyte colony-stimulating factor that stimulates the growth of neutrophils, a type of white blood cell important in the body's fight against infection. It is used to reduce the incidence of fever and infection in patients with certain types of cancer who are receiving chemotherapy that affects the bone marrow. This medicine may be used for other purposes; ask your health care provider or pharmacist if you have questions. COMMON BRAND NAME(S): Granix What should I tell my health care provider before I take this medicine? They need to know if you have any of these conditions: -bone scan or tests planned -kidney disease -sickle cell anemia -an unusual or allergic reaction to tbo-filgrastim, filgrastim, pegfilgrastim, other medicines, foods, dyes, or preservatives -pregnant or trying to get pregnant -breast-feeding How should I use this medicine? This medicine is for injection under the skin. If you get this medicine at home, you will be taught how to prepare and give this medicine. Refer to the Instructions for Use that come with your medication packaging. Use exactly as directed. Take your medicine at regular intervals. Do not take your medicine more often than directed. It is important that you put your used needles and syringes in a special sharps container. Do not put them in a trash can. If you do not have a sharps container, call your pharmacist or healthcare provider to get one. Talk to your pediatrician regarding the use of this medicine in children. While this drug may be prescribed for children as young as 1 month of age for selected conditions, precautions do apply. Overdosage: If you think you have taken too much of this medicine contact a poison control center or emergency room at once. NOTE: This medicine is only for you. Do not share this medicine with others. What if I miss a dose? It is  important not to miss your dose. Call your doctor or health care professional if you miss a dose. What may interact with this medicine? This medicine may interact with the following medications: -medicines that may cause a release of neutrophils, such as lithium This list may not describe all possible interactions. Give your health care provider a list of all the medicines, herbs, non-prescription drugs, or dietary supplements you use. Also tell them if you smoke, drink alcohol, or use illegal drugs. Some items may interact with your medicine. What should I watch for while using this medicine? You may need blood work done while you are taking this medicine. What side effects may I notice from receiving this medicine? Side effects that you should report to your doctor or health care professional as soon as possible: -allergic reactions like skin rash, itching or hives, swelling of the face, lips, or tongue -back pain -blood in the urine -dark urine -dizziness -fast heartbeat -feeling faint -shortness of breath or breathing problems -signs and symptoms of infection like fever or chills; cough; or sore throat -signs and symptoms of kidney injury like trouble passing urine or change in the amount of urine -stomach or side pain, or pain at the shoulder -sweating -swelling of the legs, ankles, or abdomen -tiredness Side effects that usually do not require medical attention (report to your doctor or health care professional if they continue or are bothersome): -bone pain -diarrhea -headache -muscle pain -vomiting This list may not describe all possible side effects. Call your doctor for medical advice about side effects. You may report side effects to FDA at   1-800-FDA-1088. Where should I keep my medicine? Keep out of the reach of children. Store in a refrigerator between 2 and 8 degrees C (36 and 46 degrees F). Keep in carton to protect from light. Throw away this medicine if it is left out  of the refrigerator for more than 5 consecutive days. Throw away any unused medicine after the expiration date. NOTE: This sheet is a summary. It may not cover all possible information. If you have questions about this medicine, talk to your doctor, pharmacist, or health care provider.  2019 Elsevier/Gold Standard (2017-05-11 16:56:18)  

## 2018-12-13 ENCOUNTER — Inpatient Hospital Stay: Payer: BLUE CROSS/BLUE SHIELD | Attending: Adult Health

## 2018-12-13 ENCOUNTER — Telehealth: Payer: Self-pay | Admitting: *Deleted

## 2018-12-13 DIAGNOSIS — D61810 Antineoplastic chemotherapy induced pancytopenia: Secondary | ICD-10-CM | POA: Insufficient documentation

## 2018-12-13 DIAGNOSIS — C20 Malignant neoplasm of rectum: Secondary | ICD-10-CM | POA: Diagnosis present

## 2018-12-13 DIAGNOSIS — Z7901 Long term (current) use of anticoagulants: Secondary | ICD-10-CM | POA: Diagnosis not present

## 2018-12-13 DIAGNOSIS — Z452 Encounter for adjustment and management of vascular access device: Secondary | ICD-10-CM | POA: Insufficient documentation

## 2018-12-13 DIAGNOSIS — C787 Secondary malignant neoplasm of liver and intrahepatic bile duct: Secondary | ICD-10-CM | POA: Diagnosis not present

## 2018-12-13 DIAGNOSIS — I10 Essential (primary) hypertension: Secondary | ICD-10-CM | POA: Diagnosis not present

## 2018-12-13 DIAGNOSIS — Z5111 Encounter for antineoplastic chemotherapy: Secondary | ICD-10-CM | POA: Diagnosis present

## 2018-12-13 DIAGNOSIS — Z5112 Encounter for antineoplastic immunotherapy: Secondary | ICD-10-CM | POA: Insufficient documentation

## 2018-12-13 DIAGNOSIS — Z5189 Encounter for other specified aftercare: Secondary | ICD-10-CM | POA: Insufficient documentation

## 2018-12-13 DIAGNOSIS — Z7189 Other specified counseling: Secondary | ICD-10-CM

## 2018-12-13 DIAGNOSIS — I2699 Other pulmonary embolism without acute cor pulmonale: Secondary | ICD-10-CM | POA: Diagnosis not present

## 2018-12-13 MED ORDER — TBO-FILGRASTIM 480 MCG/0.8ML ~~LOC~~ SOSY
480.0000 ug | PREFILLED_SYRINGE | Freq: Once | SUBCUTANEOUS | Status: AC
  Administered 2018-12-13: 480 ug via SUBCUTANEOUS

## 2018-12-13 MED ORDER — TBO-FILGRASTIM 480 MCG/0.8ML ~~LOC~~ SOSY
PREFILLED_SYRINGE | SUBCUTANEOUS | Status: AC
  Filled 2018-12-13: qty 0.8

## 2018-12-13 NOTE — Progress Notes (Signed)
Billy Mendoza   [phone] [phone]   Clinic Follow up Note   Patient Care Team: Merry Proud, MD as PCP - General (Internal Medicine) Truitt Merle, MD as Consulting Physician (Hematology) Carol Ada, MD as Consulting Physician (Gastroenterology) 12/15/2018  CHIEF COMPLAINT: F/u of rectal cancer metastasized to liver   SUMMARY OF ONCOLOGIC HISTORY: Oncology History   Cancer Staging Rectal cancer metastasized to liver [facility]) Staging form: Colon and Rectum, AJCC 8th Edition - Clinical stage from 07/28/2018: Stage IVB (cTX, cNX, pM1b) - Signed by Truitt Merle, MD on 08/18/2018       Rectal cancer metastasized to liver ([place])   07/28/2018 Initial Biopsy    Biopsy 07/28/18 Final Microscopic Diagnosis A.Colon, Ascending, Polyp:  -Fragments of tubular adenoma -No high-grade dysplasia of malignancy.  B. Rectum, Mass, Polyp:  -Invasive adenocarcinoma, moderately - poorly differentiatred.     07/28/2018 Cancer Staging    Staging form: Colon and Rectum, AJCC 8th Edition - Clinical stage from 07/28/2018: Stage IVB (cTX, cNX, pM1b) - Signed by Truitt Merle, MD on 08/18/2018    08/04/2018 Initial Diagnosis    Rectal cancer metastasized to liver ([place])    08/16/2018 Pathology Results    Biospy  Diagnosis 08/16/18 Liver, needle/core biopsy - ADENOCARCINOMA. Microscopic Comment By morphology, the findings are compatible with the provided clinical history of colorectal adenocarcinoma. Results reported to [name] on 08/18/2018. Intradepartmental consultation was obtained ([name]).     08/16/2018 Miscellaneous    Foundation 1 genomic testing: MS-stable Tumor mutation burden  3/Mb K-ras/NRAS wild-type BRAF V600 E KDMSA R266Q PTEN T333f* SMAD4 loss TP53 loss      08/18/2018 Procedure    Colonoscopy by Dr. HBenson Norwayshowed malignant appearing partially obstructing tumor in the rectum, 8 cm above anal verge.    08/18/2018 - 09/07/2018  Chemotherapy    CAPOX q3weeks with Xeloda 20063mBID 2 weeks on, 1 weeks off starting 08/18/18.  Swithced treatment due to BRAF mutation.     09/07/2018 -  Chemotherapy    FOLFOXIRI and avastin every 2 weeks starting 09/07/18    11/04/2018 Imaging    CT CAP 11/04/18 IMPRESSION: 1. Peripheral filling defect in the right main pulmonary artery extending primarily into the right lower lobe pulmonary artery. While the peripheral location indicates some degree of chronicity of this pulmonary embolus, this is a new finding compared to 07/28/2018. The right ventricular to left ventricular ratio is 0.7, which does not specifically indicate right heart strain. 2. The malignancy appears considerably improved, with reduced size pulmonary nodules; reduced size and increased internal necrosis of the hepatic metastatic lesions; marked improvement in the thoracic adenopathy; reduced conspicuity of the rectal tumor; and significant reduction in perirectal tumor burden/adenopathy. 3. Other imaging findings of potential clinical significance: Aortic Atherosclerosis (ICD10-I70.0). Coronary atherosclerosis. Bilateral renal cysts. Small umbilical hernia contains adipose tissue.  Critical Value/emergent results were called by telephone at the time of interpretation on 11/04/2018 at 3:05 pm to Dr. YATruitt Merle who verbally acknowledged these results.     CURRENT THERAPY  FOLFOXIRI andAvastinevery 2 weeks starting 09/07/18  INTERVAL HISTORY: Billy Mendoza a [age].o. male who is here for follow-up and chemo. He reports mild bleeding in his gums, nose and a few episodes of hematochezia 2 to 3 days ago, which resolved spontaneously.  He tolerated last cycle chemotherapy moderately well, but it has been taking longer to recover.  He still has moderate fatigue at this week, still able to go  to work, but leaves earlier due to fatigue.  He denies any fever, chills, or other new symptoms.  Pertinent positives and  negatives of review of systems are listed and detailed within the above HPI.  REVIEW OF SYSTEMS:   Constitutional: Denies fevers, chills or abnormal weight loss, (+) fatigue  Eyes: Denies blurriness of vision Ears, nose, mouth, throat, and face: Denies mucositis or sore throat Respiratory: Denies cough, dyspnea or wheezes Cardiovascular: Denies palpitation, chest discomfort or lower extremity swelling Gastrointestinal:  Denies nausea, heartburn or change in bowel habits Skin: Denies abnormal skin rashes Lymphatics: Denies new lymphadenopathy or easy bruising Neurological:Denies numbness, tingling or new weaknesses Behavioral/Psych: Mood is stable, no new changes  All other systems were reviewed with the patient and are negative.  MEDICAL HISTORY:  History reviewed. No pertinent past medical history.  SURGICAL HISTORY: Past Surgical History:  Procedure Laterality Date  . IR IMAGING GUIDED PORT INSERTION  08/16/2018  . IR US GUIDE BX ASP/DRAIN  08/16/2018    I have reviewed the social history and family history with the patient and they are unchanged from previous note.  ALLERGIES:  has No Known Allergies.  MEDICATIONS:  Current Outpatient Medications  Medication Sig Dispense Refill  . amLODipine (NORVASC) 5 MG tablet Take 1 tablet (5 mg total) by mouth 2 (two) times daily. 60 tablet 2  . diphenoxylate-atropine (LOMOTIL) 2.5-0.025 MG tablet Take 1-2 tablets by mouth 4 (four) times daily as needed for diarrhea or loose stools. Up to 8 tabs daily for severe diarrhea 60 tablet 2  . ibuprofen (ADVIL,MOTRIN) 200 MG tablet Take 200 mg by mouth every 6 (six) hours as needed.    . potassium chloride SA (K-DUR,KLOR-CON) 20 MEQ tablet Take 1 tablet (20 mEq total) by mouth daily. 30 tablet 1  . Rivaroxaban 15 & 20 MG TBPK Take as directed on package: Start with one 19m tablet by mouth twice a day with food. On Day 22, switch to one 234mtablet once a day with food. 51 each 0   No current  facility-administered medications for this visit.     PHYSICAL EXAMINATION: ECOG PERFORMANCE STATUS: 1 - Symptomatic but completely ambulatory  Vitals:   12/15/18 1103  BP: (!) 144/87  Pulse: 83  Temp: 98.4 F (36.9 C)  SpO2: 100%   Filed Weights   12/15/18 1103  Weight: 242 lb 3.2 oz (109.9 kg)    GENERAL:alert, no distress and comfortable SKIN: skin color, texture, turgor are normal, no rashes or significant lesions EYES: normal, Conjunctiva are pink and non-injected, sclera clear OROPHARYNX:no exudate, no erythema and lips, buccal mucosa, and tongue normal  NECK: supple, thyroid normal size, non-tender, without nodularity LYMPH:  no palpable lymphadenopathy in the cervical, axillary or inguinal LUNGS: clear to auscultation and percussion with normal breathing effort HEART: regular rate & rhythm and no murmurs and no lower extremity edema ABDOMEN:abdomen soft, non-tender and normal bowel sounds Musculoskeletal:no cyanosis of digits and no clubbing  NEURO: alert & oriented x 3 with fluent speech, no focal motor/sensory deficits  LABORATORY DATA:  I have reviewed the data as listed CBC Latest Ref Rng & Units 12/15/2018 11/30/2018 11/24/2018  WBC 4.0 - 10.5 K/uL 6.4 4.8 3.5(L)  Hemoglobin 13.0 - 17.0 g/dL 9.4(L) 10.6(L) 9.7(L)  Hematocrit 39.0 - 52.0 % 28.6(L) 32.2(L) 30.5(L)  Platelets 150 - 400 K/uL 74(L) 192 141(L)     CMP Latest Ref Rng & Units 12/15/2018 11/30/2018 11/24/2018  Glucose 70 - 99 mg/dL 91 117(H) 109(H)  BUN 8 - 23 mg/dL 12 17 14   Creatinine 0.61 - 1.24 mg/dL 0.86 0.98 0.89  Sodium 135 - 145 mmol/L 141 142 139  Potassium 3.5 - 5.1 mmol/L 3.2(L) 4.1 3.5  Chloride 98 - 111 mmol/L 112(H) 109 110  CO2 22 - 32 mmol/L 18(L) 23 21(L)  Calcium 8.9 - 10.3 mg/dL 8.6(L) 8.7(L) 8.6(L)  Total Protein 6.5 - 8.1 g/dL 6.8 7.5 6.7  Total Bilirubin 0.3 - 1.2 mg/dL 0.3 0.3 0.3  Alkaline Phos 38 - 126 U/L 125 172(H) 121  AST 15 - 41 U/L 24 43(H) 31  ALT 0 - 44 U/L 22 58(H)  34      RADIOGRAPHIC STUDIES: I have personally reviewed the radiological images as listed and agreed with the findings in the report. No results found.   ASSESSMENT & PLAN:  Brandell Maready is a 68 y.o. male with history of  1. Proximal rectal adenocarcinoma, metastatic to liver and likely tolungsandnodes, MSS,KRAS/NRAS wild type,BRAF V600E mutation (+) -He was diagnosed in10/2019.He completed 1 cycle of CAPOX before changing to FOLFOXIRI andAvastindue to BRAF mutation in his tumor.  - Hismetastatic diseaseis not curable, he will continue treatment for as long as he can tolerate to control disease.  -He continues to tolerate FOLFOXIRI moderately well  - If he develops more side effects, especially neuropathy from oxaliplatin, we can change his treatment to FOLFIRI.  If he continues to have excellent response after 9 to 12 months chemo, we can also consider change to maintenance therapy with 5-FU or Xeloda with Avastin. - Last visit on 11/24/18 his chemotherapy was held due to neutropenia with ANC 0.5.  -He has developed slightly worsening fatigue, had a few episodes of bleeding including hematochezia, resolved spontaneously. -Lab reviewed, platelet count 74 today, will hold chemotherapy and postpone to next week. -Due to worsening cytopenias, reduce his chemo dose from next cycle. -Follow-up in 3 weeks  2. Right PE -His PE is likely related to his underlying metastatic colon cancer and chemotherapy, especially Avastin. - He is on Xarelto 20 mg daily and will continue indefinitely    3. Pancytopeniasecondary tocancer and chemotherapy -Lab reviewed, platelet count 74, will hold chemotherapy today and postpone to next week -Due to his worsening cytopenias, I will slightly decrease his chemo dose.  4. HTN  -Takes Amlodipine - BP overall controlled   5. Goal of care discussion -He is full code now -He understands his disease is incurable,and the goal of therapy is  palliative to prolong his life.   Plan -Due to moderate thrombocytopenia, I will hold chemo today and postpone chemotherapy to next week -We will reduce his chemo dose from next cycle -Lab and follow-up in 3 weeks    No problem-specific Assessment & Plan notes found for this encounter.   No orders of the defined types were placed in this encounter.  All questions were answered. The patient knows to call the clinic with any problems, questions or concerns. No barriers to learning was detected. I spent 20 minutes counseling the patient face to face. The total time spent in the appointment was 25 minutes and more than 50% was on counseling and review of test results  I, Manson Allan am acting as scribe for [name].  I have reviewed the above documentation for accuracy and completeness, and I agree with the above.     Truitt Merle, MD 12/15/2018

## 2018-12-13 NOTE — Telephone Encounter (Signed)
Received call from pt's wife stating that pt is on xarelto although has missed a couple of doses last week, he has not doubled up.  She states that he has had a couple of times that his gums have bled & a couple of nose bleeds but stop easily.  He also has had some blood after bowel movement twice.  She states that he feels more exhausted since last treatment.  He will be here today for labs & treatment on Thursday.  Discussed possible hemorrhoids & suggested increased fluids & fiber in diet & sitz bath & hemorrhoid cream externally if needed.  We will look at labs this pm.  Message to Dr Burr Medico.

## 2018-12-13 NOTE — Patient Instructions (Signed)
Tbo-Filgrastim injection What is this medicine? TBO-FILGRASTIM (T B O fil GRA stim) is a granulocyte colony-stimulating factor that stimulates the growth of neutrophils, a type of white blood cell important in the body's fight against infection. It is used to reduce the incidence of fever and infection in patients with certain types of cancer who are receiving chemotherapy that affects the bone marrow. This medicine may be used for other purposes; ask your health care provider or pharmacist if you have questions. COMMON BRAND NAME(S): Granix What should I tell my health care provider before I take this medicine? They need to know if you have any of these conditions: -bone scan or tests planned -kidney disease -sickle cell anemia -an unusual or allergic reaction to tbo-filgrastim, filgrastim, pegfilgrastim, other medicines, foods, dyes, or preservatives -pregnant or trying to get pregnant -breast-feeding How should I use this medicine? This medicine is for injection under the skin. If you get this medicine at home, you will be taught how to prepare and give this medicine. Refer to the Instructions for Use that come with your medication packaging. Use exactly as directed. Take your medicine at regular intervals. Do not take your medicine more often than directed. It is important that you put your used needles and syringes in a special sharps container. Do not put them in a trash can. If you do not have a sharps container, call your pharmacist or healthcare provider to get one. Talk to your pediatrician regarding the use of this medicine in children. While this drug may be prescribed for children as young as 1 month of age for selected conditions, precautions do apply. Overdosage: If you think you have taken too much of this medicine contact a poison control center or emergency room at once. NOTE: This medicine is only for you. Do not share this medicine with others. What if I miss a dose? It is  important not to miss your dose. Call your doctor or health care professional if you miss a dose. What may interact with this medicine? This medicine may interact with the following medications: -medicines that may cause a release of neutrophils, such as lithium This list may not describe all possible interactions. Give your health care provider a list of all the medicines, herbs, non-prescription drugs, or dietary supplements you use. Also tell them if you smoke, drink alcohol, or use illegal drugs. Some items may interact with your medicine. What should I watch for while using this medicine? You may need blood work done while you are taking this medicine. What side effects may I notice from receiving this medicine? Side effects that you should report to your doctor or health care professional as soon as possible: -allergic reactions like skin rash, itching or hives, swelling of the face, lips, or tongue -back pain -blood in the urine -dark urine -dizziness -fast heartbeat -feeling faint -shortness of breath or breathing problems -signs and symptoms of infection like fever or chills; cough; or sore throat -signs and symptoms of kidney injury like trouble passing urine or change in the amount of urine -stomach or side pain, or pain at the shoulder -sweating -swelling of the legs, ankles, or abdomen -tiredness Side effects that usually do not require medical attention (report to your doctor or health care professional if they continue or are bothersome): -bone pain -diarrhea -headache -muscle pain -vomiting This list may not describe all possible side effects. Call your doctor for medical advice about side effects. You may report side effects to FDA at   1-800-FDA-1088. Where should I keep my medicine? Keep out of the reach of children. Store in a refrigerator between 2 and 8 degrees C (36 and 46 degrees F). Keep in carton to protect from light. Throw away this medicine if it is left out  of the refrigerator for more than 5 consecutive days. Throw away any unused medicine after the expiration date. NOTE: This sheet is a summary. It may not cover all possible information. If you have questions about this medicine, talk to your doctor, pharmacist, or health care provider.  2019 Elsevier/Gold Standard (2017-05-11 16:56:18)  

## 2018-12-15 ENCOUNTER — Encounter: Payer: Self-pay | Admitting: Hematology

## 2018-12-15 ENCOUNTER — Telehealth: Payer: Self-pay | Admitting: Hematology

## 2018-12-15 ENCOUNTER — Inpatient Hospital Stay (HOSPITAL_BASED_OUTPATIENT_CLINIC_OR_DEPARTMENT_OTHER): Payer: BLUE CROSS/BLUE SHIELD | Admitting: Hematology

## 2018-12-15 ENCOUNTER — Other Ambulatory Visit: Payer: Self-pay

## 2018-12-15 ENCOUNTER — Inpatient Hospital Stay: Payer: BLUE CROSS/BLUE SHIELD

## 2018-12-15 VITALS — BP 144/87 | HR 83 | Temp 98.4°F | Wt 242.2 lb

## 2018-12-15 DIAGNOSIS — I2699 Other pulmonary embolism without acute cor pulmonale: Secondary | ICD-10-CM | POA: Diagnosis not present

## 2018-12-15 DIAGNOSIS — Z7901 Long term (current) use of anticoagulants: Secondary | ICD-10-CM

## 2018-12-15 DIAGNOSIS — C787 Secondary malignant neoplasm of liver and intrahepatic bile duct: Secondary | ICD-10-CM

## 2018-12-15 DIAGNOSIS — I10 Essential (primary) hypertension: Secondary | ICD-10-CM | POA: Diagnosis not present

## 2018-12-15 DIAGNOSIS — D61810 Antineoplastic chemotherapy induced pancytopenia: Secondary | ICD-10-CM

## 2018-12-15 DIAGNOSIS — Z95828 Presence of other vascular implants and grafts: Secondary | ICD-10-CM

## 2018-12-15 DIAGNOSIS — C20 Malignant neoplasm of rectum: Secondary | ICD-10-CM

## 2018-12-15 DIAGNOSIS — Z5112 Encounter for antineoplastic immunotherapy: Secondary | ICD-10-CM | POA: Diagnosis not present

## 2018-12-15 DIAGNOSIS — D500 Iron deficiency anemia secondary to blood loss (chronic): Secondary | ICD-10-CM

## 2018-12-15 LAB — CMP (CANCER CENTER ONLY)
ALT: 22 U/L (ref 0–44)
AST: 24 U/L (ref 15–41)
Albumin: 3.2 g/dL — ABNORMAL LOW (ref 3.5–5.0)
Alkaline Phosphatase: 125 U/L (ref 38–126)
Anion gap: 11 (ref 5–15)
BUN: 12 mg/dL (ref 8–23)
CO2: 18 mmol/L — AB (ref 22–32)
Calcium: 8.6 mg/dL — ABNORMAL LOW (ref 8.9–10.3)
Chloride: 112 mmol/L — ABNORMAL HIGH (ref 98–111)
Creatinine: 0.86 mg/dL (ref 0.61–1.24)
GFR, Est AFR Am: >60 mL/min (ref >60)
GFR, Estimated: >60 mL/min (ref >60)
Glucose, Bld: 91 mg/dL (ref 70–99)
Potassium: 3.2 mmol/L — ABNORMAL LOW (ref 3.5–5.1)
Sodium: 141 mmol/L (ref 135–145)
Total Bilirubin: 0.3 mg/dL (ref 0.3–1.2)
Total Protein: 6.8 g/dL (ref 6.5–8.1)

## 2018-12-15 LAB — CBC WITH DIFFERENTIAL (CANCER CENTER ONLY)
Abs Immature Granulocytes: 0.52 10*3/uL — ABNORMAL HIGH (ref 0.00–0.07)
Basophils Absolute: 0 10*3/uL (ref 0.0–0.1)
Basophils Relative: 0 %
Eosinophils Absolute: 0.2 10*3/uL (ref 0.0–0.5)
Eosinophils Relative: 3 %
HCT: 28.6 % — ABNORMAL LOW (ref 39.0–52.0)
Hemoglobin: 9.4 g/dL — ABNORMAL LOW (ref 13.0–17.0)
Immature Granulocytes: 8 %
Lymphocytes Relative: 38 %
Lymphs Abs: 2.5 10*3/uL (ref 0.7–4.0)
MCH: 33.3 pg (ref 26.0–34.0)
MCHC: 32.9 g/dL (ref 30.0–36.0)
MCV: 101.4 fL — ABNORMAL HIGH (ref 80.0–100.0)
Monocytes Absolute: 0.9 10*3/uL (ref 0.1–1.0)
Monocytes Relative: 14 %
NEUTROS ABS: 2.4 10*3/uL (ref 1.7–7.7)
Neutrophils Relative %: 37 %
Platelet Count: 74 10*3/uL — ABNORMAL LOW (ref 150–400)
RBC: 2.82 MIL/uL — ABNORMAL LOW (ref 4.22–5.81)
RDW: 20.9 % — ABNORMAL HIGH (ref 11.5–15.5)
WBC Count: 6.4 10*3/uL (ref 4.0–10.5)
nRBC: 0.3 % — ABNORMAL HIGH (ref 0.0–0.2)

## 2018-12-15 LAB — TOTAL PROTEIN, URINE DIPSTICK: Protein, ur: <30 mg/dL — AB

## 2018-12-15 MED ORDER — SODIUM CHLORIDE 0.9% FLUSH
10.0000 mL | INTRAVENOUS | Status: DC | PRN
  Administered 2018-12-15: 10 mL
  Filled 2018-12-15: qty 10

## 2018-12-15 MED ORDER — HEPARIN SOD (PORK) LOCK FLUSH 100 UNIT/ML IV SOLN
500.0000 [IU] | Freq: Once | INTRAVENOUS | Status: AC
  Administered 2018-12-15: 500 [IU] via INTRAVENOUS
  Filled 2018-12-15: qty 5

## 2018-12-15 NOTE — Telephone Encounter (Signed)
Scheduled appt per 3/12 los.  Added treatment to the book for 3/19 for approval.  Will call patient once treatment has been added.  (Rescheduled other appts accordingly)

## 2018-12-15 NOTE — Addendum Note (Signed)
Addended by: Truitt Merle on: 12/15/2018 03:38 PM   Modules accepted: Orders

## 2018-12-15 NOTE — Progress Notes (Signed)
Notified Laurence Compton RN AD no chemo treatment today.

## 2018-12-17 ENCOUNTER — Other Ambulatory Visit: Payer: Self-pay | Admitting: Hematology

## 2018-12-19 ENCOUNTER — Other Ambulatory Visit: Payer: Self-pay | Admitting: Hematology

## 2018-12-19 ENCOUNTER — Telehealth: Payer: Self-pay | Admitting: Hematology

## 2018-12-19 ENCOUNTER — Telehealth: Payer: Self-pay

## 2018-12-19 NOTE — Telephone Encounter (Signed)
Left voice message for patient regarding lab results, per Dr. Burr Medico Potassium is low, should be taking a daily Potassium pill, instructed to increase to 2 tabs daily.  Encouraged him to call back if he has any questions

## 2018-12-19 NOTE — Telephone Encounter (Signed)
-----   Message from Truitt Merle, MD sent at 12/17/2018 10:41 AM EDT ----- Please let pt know his K was low, make sure he is taking KCL, if yes, increase 1 tab a day, thanks   Truitt Merle  12/17/2018

## 2018-12-19 NOTE — Telephone Encounter (Signed)
Called and inform patient that there treatment has been added.  Patient aware of appt date and time.

## 2018-12-20 ENCOUNTER — Other Ambulatory Visit: Payer: Self-pay | Admitting: Hematology

## 2018-12-20 DIAGNOSIS — I2699 Other pulmonary embolism without acute cor pulmonale: Secondary | ICD-10-CM

## 2018-12-21 ENCOUNTER — Inpatient Hospital Stay: Payer: BLUE CROSS/BLUE SHIELD

## 2018-12-21 ENCOUNTER — Other Ambulatory Visit: Payer: Self-pay

## 2018-12-21 DIAGNOSIS — Z95828 Presence of other vascular implants and grafts: Secondary | ICD-10-CM

## 2018-12-21 DIAGNOSIS — C20 Malignant neoplasm of rectum: Secondary | ICD-10-CM

## 2018-12-21 DIAGNOSIS — C787 Secondary malignant neoplasm of liver and intrahepatic bile duct: Principal | ICD-10-CM

## 2018-12-21 DIAGNOSIS — Z5112 Encounter for antineoplastic immunotherapy: Secondary | ICD-10-CM | POA: Diagnosis not present

## 2018-12-21 LAB — CBC WITH DIFFERENTIAL (CANCER CENTER ONLY)
ABS IMMATURE GRANULOCYTES: 0.04 10*3/uL (ref 0.00–0.07)
Basophils Absolute: 0 10*3/uL (ref 0.0–0.1)
Basophils Relative: 1 %
Eosinophils Absolute: 0.1 10*3/uL (ref 0.0–0.5)
Eosinophils Relative: 2 %
HCT: 28.4 % — ABNORMAL LOW (ref 39.0–52.0)
Hemoglobin: 9.2 g/dL — ABNORMAL LOW (ref 13.0–17.0)
IMMATURE GRANULOCYTES: 1 %
LYMPHS PCT: 43 %
Lymphs Abs: 2.2 10*3/uL (ref 0.7–4.0)
MCH: 34.7 pg — ABNORMAL HIGH (ref 26.0–34.0)
MCHC: 32.4 g/dL (ref 30.0–36.0)
MCV: 107.2 fL — ABNORMAL HIGH (ref 80.0–100.0)
Monocytes Absolute: 0.8 10*3/uL (ref 0.1–1.0)
Monocytes Relative: 15 %
Neutro Abs: 1.9 10*3/uL (ref 1.7–7.7)
Neutrophils Relative %: 38 %
Platelet Count: 104 10*3/uL — ABNORMAL LOW (ref 150–400)
RBC: 2.65 MIL/uL — ABNORMAL LOW (ref 4.22–5.81)
RDW: 22.9 % — ABNORMAL HIGH (ref 11.5–15.5)
WBC Count: 5.1 10*3/uL (ref 4.0–10.5)
nRBC: 0.4 % — ABNORMAL HIGH (ref 0.0–0.2)

## 2018-12-21 LAB — CMP (CANCER CENTER ONLY)
ALT: 48 U/L — ABNORMAL HIGH (ref 0–44)
AST: 39 U/L (ref 15–41)
Albumin: 3.2 g/dL — ABNORMAL LOW (ref 3.5–5.0)
Alkaline Phosphatase: 140 U/L — ABNORMAL HIGH (ref 38–126)
Anion gap: 10 (ref 5–15)
BUN: 14 mg/dL (ref 8–23)
CO2: 22 mmol/L (ref 22–32)
Calcium: 8.4 mg/dL — ABNORMAL LOW (ref 8.9–10.3)
Chloride: 110 mmol/L (ref 98–111)
Creatinine: 1.02 mg/dL (ref 0.61–1.24)
GFR, Estimated: >60 mL/min (ref >60)
Glucose, Bld: 97 mg/dL (ref 70–99)
Potassium: 3.5 mmol/L (ref 3.5–5.1)
SODIUM: 142 mmol/L (ref 135–145)
Total Bilirubin: 0.4 mg/dL (ref 0.3–1.2)
Total Protein: 6.9 g/dL (ref 6.5–8.1)

## 2018-12-21 LAB — CEA (IN HOUSE-CHCC): CEA (CHCC-In House): 27.9 ng/mL — ABNORMAL HIGH (ref 0.00–5.00)

## 2018-12-21 LAB — FERRITIN: Ferritin: 69 ng/mL (ref 24–336)

## 2018-12-21 MED ORDER — HEPARIN SOD (PORK) LOCK FLUSH 100 UNIT/ML IV SOLN
500.0000 [IU] | Freq: Once | INTRAVENOUS | Status: AC | PRN
  Administered 2018-12-21: 500 [IU]
  Filled 2018-12-21: qty 5

## 2018-12-21 MED ORDER — SODIUM CHLORIDE 0.9% FLUSH
10.0000 mL | Freq: Once | INTRAVENOUS | Status: AC | PRN
  Administered 2018-12-21: 10 mL
  Filled 2018-12-21: qty 10

## 2018-12-22 ENCOUNTER — Other Ambulatory Visit: Payer: BLUE CROSS/BLUE SHIELD

## 2018-12-22 ENCOUNTER — Other Ambulatory Visit: Payer: Self-pay

## 2018-12-22 ENCOUNTER — Encounter: Payer: Self-pay | Admitting: Hematology

## 2018-12-22 ENCOUNTER — Inpatient Hospital Stay: Payer: BLUE CROSS/BLUE SHIELD

## 2018-12-22 VITALS — BP 128/78 | HR 64 | Temp 98.5°F | Resp 18

## 2018-12-22 DIAGNOSIS — Z7189 Other specified counseling: Secondary | ICD-10-CM

## 2018-12-22 DIAGNOSIS — C787 Secondary malignant neoplasm of liver and intrahepatic bile duct: Principal | ICD-10-CM

## 2018-12-22 DIAGNOSIS — C20 Malignant neoplasm of rectum: Secondary | ICD-10-CM

## 2018-12-22 DIAGNOSIS — Z5112 Encounter for antineoplastic immunotherapy: Secondary | ICD-10-CM | POA: Diagnosis not present

## 2018-12-22 MED ORDER — ATROPINE SULFATE 1 MG/ML IJ SOLN
INTRAMUSCULAR | Status: AC
  Filled 2018-12-22: qty 1

## 2018-12-22 MED ORDER — IRINOTECAN HCL CHEMO INJECTION 100 MG/5ML
165.0000 mg/m2 | Freq: Once | INTRAVENOUS | Status: AC
  Administered 2018-12-22: 400 mg via INTRAVENOUS
  Filled 2018-12-22: qty 5

## 2018-12-22 MED ORDER — PALONOSETRON HCL INJECTION 0.25 MG/5ML
INTRAVENOUS | Status: AC
  Filled 2018-12-22: qty 5

## 2018-12-22 MED ORDER — OXALIPLATIN CHEMO INJECTION 100 MG/20ML
70.0000 mg/m2 | Freq: Once | INTRAVENOUS | Status: AC
  Administered 2018-12-22: 170 mg via INTRAVENOUS
  Filled 2018-12-22: qty 34

## 2018-12-22 MED ORDER — PALONOSETRON HCL INJECTION 0.25 MG/5ML
0.2500 mg | Freq: Once | INTRAVENOUS | Status: AC
  Administered 2018-12-22: 0.25 mg via INTRAVENOUS

## 2018-12-22 MED ORDER — ATROPINE SULFATE 1 MG/ML IJ SOLN
0.5000 mg | Freq: Once | INTRAMUSCULAR | Status: AC | PRN
  Administered 2018-12-22: 0.5 mg via INTRAVENOUS

## 2018-12-22 MED ORDER — LEUCOVORIN CALCIUM INJECTION 350 MG
200.0000 mg/m2 | Freq: Once | INTRAVENOUS | Status: AC
  Administered 2018-12-22: 482 mg via INTRAVENOUS
  Filled 2018-12-22: qty 24.1

## 2018-12-22 MED ORDER — SODIUM CHLORIDE 0.9 % IV SOLN
Freq: Once | INTRAVENOUS | Status: AC
  Administered 2018-12-22: 09:00:00 via INTRAVENOUS
  Filled 2018-12-22: qty 250

## 2018-12-22 MED ORDER — DEXAMETHASONE SODIUM PHOSPHATE 10 MG/ML IJ SOLN
10.0000 mg | Freq: Once | INTRAMUSCULAR | Status: AC
  Administered 2018-12-22: 10 mg via INTRAVENOUS

## 2018-12-22 MED ORDER — SODIUM CHLORIDE 0.9 % IV SOLN
600.0000 mg | Freq: Once | INTRAVENOUS | Status: AC
  Administered 2018-12-22: 600 mg via INTRAVENOUS
  Filled 2018-12-22: qty 16

## 2018-12-22 MED ORDER — DEXAMETHASONE SODIUM PHOSPHATE 10 MG/ML IJ SOLN
INTRAMUSCULAR | Status: AC
  Filled 2018-12-22: qty 1

## 2018-12-22 MED ORDER — DEXTROSE 5 % IV SOLN
Freq: Once | INTRAVENOUS | Status: AC
  Administered 2018-12-22: 10:00:00 via INTRAVENOUS
  Filled 2018-12-22: qty 250

## 2018-12-22 MED ORDER — SODIUM CHLORIDE 0.9 % IV SOLN
2600.0000 mg/m2 | INTRAVENOUS | Status: DC
  Administered 2018-12-22: 6250 mg via INTRAVENOUS
  Filled 2018-12-22: qty 125

## 2018-12-22 NOTE — Patient Instructions (Addendum)
Coronavirus (COVID-19) Are you at risk?  Are you at risk for the Coronavirus (COVID-19)?  To be considered HIGH RISK for Coronavirus (COVID-19), you have to meet the following criteria:  . Traveled to [place], [place], [place], [place] or [place]; or in the [place] to [place], [place], [place], or [place]; and have fever, cough, and shortness of breath within the last 2 weeks of travel OR . Been in close contact with a person diagnosed with COVID-19 within the last 2 weeks and have fever, cough, and shortness of breath . IF YOU DO NOT MEET THESE CRITERIA, YOU ARE CONSIDERED LOW RISK FOR COVID-19.  What to do if you are HIGH RISK for COVID-19?  . If you are having a medical emergency, call 911. . Seek medical care right away. Before you go to a doctor's office, urgent care or emergency department, call ahead and tell them about your recent travel, contact with someone diagnosed with COVID-19, and your symptoms. You should receive instructions from your physician's office regarding next steps of care.  . When you arrive at healthcare provider, tell the healthcare staff immediately you have returned from visiting [place], [place], [place], [place] or [place]; or traveled in the [place] to [place], [place], [place], or [place]; in the last two weeks or you have been in close contact with a person diagnosed with COVID-19 in the last 2 weeks.   . Tell the health care staff about your symptoms: fever, cough and shortness of breath. . After you have been seen by a medical provider, you will be either: o Tested for (COVID-19) and discharged home on quarantine except to seek medical care if symptoms worsen, and asked to  - Stay home and avoid contact with others until you get your results (4-5 days)  - Avoid travel on public transportation if possible (such as bus, train, or airplane) or o Sent to the Emergency Department by EMS for evaluation, COVID-19 testing, and possible  admission depending on your condition and test results.  What to do if you are LOW RISK for COVID-19?  Reduce your risk of any infection by using the same precautions used for avoiding the common cold or flu:  . Wash your hands often with soap and warm water for at least 20 seconds.  If soap and water are not readily available, use an alcohol-based hand sanitizer with at least 60% alcohol.  . If coughing or sneezing, cover your mouth and nose by coughing or sneezing into the elbow areas of your shirt or coat, into a tissue or into your sleeve (not your hands). . Avoid shaking hands with others and consider head nods or verbal greetings only. . Avoid touching your eyes, nose, or mouth with unwashed hands.  . Avoid close contact with people who are sick. . Avoid places or events with large numbers of people in one location, like concerts or sporting events. . Carefully consider travel plans you have or are making. . If you are planning any travel outside or inside the [place], visit the CDC's Travelers' Health webpage for the latest health notices. . If you have some symptoms but not all symptoms, continue to monitor at home and seek medical attention if your symptoms worsen. . If you are having a medical emergency, call 911.   ADDITIONAL HEALTHCARE OPTIONS FOR PATIENTS  [facility] / e-Visit: [email or web address]         [facility]: [phone]  Onset   Urgent Care: [facility]: [facility] Discharge Instructions for Patients Receiving Chemotherapy  Today you received the following chemotherapy agents Avastin, Irinotecan, Oxaliplatin, Leucovorin, and 5FU  To help prevent nausea and vomiting after your treatment, we encourage you to take your nausea medication as directed   If you develop nausea and vomiting that is not controlled by your nausea  medication, call the clinic.   BELOW ARE SYMPTOMS THAT SHOULD BE REPORTED IMMEDIATELY:  *FEVER GREATER THAN 100.5 F  *CHILLS WITH OR WITHOUT FEVER  NAUSEA AND VOMITING THAT IS NOT CONTROLLED WITH YOUR NAUSEA MEDICATION  *UNUSUAL SHORTNESS OF BREATH  *UNUSUAL BRUISING OR BLEEDING  TENDERNESS IN MOUTH AND THROAT WITH OR WITHOUT PRESENCE OF ULCERS  *URINARY PROBLEMS  *BOWEL PROBLEMS  UNUSUAL RASH Items with * indicate a potential emergency and should be followed up as soon as possible.  Feel free to call the clinic should you have any questions or concerns. The clinic phone number is [phone].  Please show the [facility] at check-in to the Emergency Department and triage nurse.

## 2018-12-23 ENCOUNTER — Telehealth: Payer: Self-pay | Admitting: Hematology

## 2018-12-23 NOTE — Telephone Encounter (Signed)
Added injection for 3/31 per 3/20 los. Confirmed with patient.

## 2018-12-24 ENCOUNTER — Inpatient Hospital Stay: Payer: BLUE CROSS/BLUE SHIELD

## 2018-12-24 ENCOUNTER — Other Ambulatory Visit: Payer: Self-pay

## 2018-12-24 VITALS — BP 120/88 | HR 73 | Temp 98.4°F | Resp 18

## 2018-12-24 DIAGNOSIS — Z5112 Encounter for antineoplastic immunotherapy: Secondary | ICD-10-CM | POA: Diagnosis not present

## 2018-12-24 DIAGNOSIS — Z7189 Other specified counseling: Secondary | ICD-10-CM

## 2018-12-24 DIAGNOSIS — C787 Secondary malignant neoplasm of liver and intrahepatic bile duct: Principal | ICD-10-CM

## 2018-12-24 DIAGNOSIS — C20 Malignant neoplasm of rectum: Secondary | ICD-10-CM

## 2018-12-24 MED ORDER — TBO-FILGRASTIM 480 MCG/0.8ML ~~LOC~~ SOSY
480.0000 ug | PREFILLED_SYRINGE | Freq: Once | SUBCUTANEOUS | Status: AC
  Administered 2018-12-24: 480 ug via SUBCUTANEOUS

## 2018-12-24 MED ORDER — SODIUM CHLORIDE 0.9% FLUSH
10.0000 mL | INTRAVENOUS | Status: DC | PRN
  Administered 2018-12-24: 10 mL
  Filled 2018-12-24: qty 10

## 2018-12-24 MED ORDER — HEPARIN SOD (PORK) LOCK FLUSH 100 UNIT/ML IV SOLN
500.0000 [IU] | Freq: Once | INTRAVENOUS | Status: AC | PRN
  Administered 2018-12-24: 500 [IU]
  Filled 2018-12-24: qty 5

## 2018-12-24 MED ORDER — TBO-FILGRASTIM 480 MCG/0.8ML ~~LOC~~ SOSY
PREFILLED_SYRINGE | SUBCUTANEOUS | Status: AC
  Filled 2018-12-24: qty 0.8

## 2018-12-29 ENCOUNTER — Other Ambulatory Visit: Payer: BLUE CROSS/BLUE SHIELD

## 2018-12-29 ENCOUNTER — Ambulatory Visit: Payer: BLUE CROSS/BLUE SHIELD | Admitting: Hematology

## 2018-12-29 ENCOUNTER — Ambulatory Visit: Payer: BLUE CROSS/BLUE SHIELD

## 2019-01-02 ENCOUNTER — Telehealth: Payer: Self-pay

## 2019-01-02 NOTE — Telephone Encounter (Signed)
Patient's wife Vania Rea calls inquiring about when his next CT scan will be, last one in January, they thought he might get another in April but nothing has been scheduled.   [phone]

## 2019-01-02 NOTE — Progress Notes (Signed)
[place]   [phone] Fax:([phone]   Clinic Follow up Note   Patient Care Team: Merry Proud, MD as PCP - General (Internal Medicine) Truitt Merle, MD as Consulting Physician (Hematology) Carol Ada, MD as Consulting Physician (Gastroenterology)  Date of Service:  01/04/2019  CHIEF COMPLAINT: F/u of rectal cancer metastasized to liver   SUMMARY OF ONCOLOGIC HISTORY: Oncology History   Cancer Staging Rectal cancer metastasized to liver [facility]) Staging form: Colon and Rectum, AJCC 8th Edition - Clinical stage from 07/28/2018: Stage IVB (cTX, cNX, pM1b) - Signed by Truitt Merle, MD on 08/18/2018       Rectal cancer metastasized to liver ([place])   07/28/2018 Initial Biopsy    Biopsy 07/28/18 Final Microscopic Diagnosis A.Colon, Ascending, Polyp:  -Fragments of tubular adenoma -No high-grade dysplasia of malignancy.  B. Rectum, Mass, Polyp:  -Invasive adenocarcinoma, moderately - poorly differentiatred.     07/28/2018 Cancer Staging    Staging form: Colon and Rectum, AJCC 8th Edition - Clinical stage from 07/28/2018: Stage IVB (cTX, cNX, pM1b) - Signed by Truitt Merle, MD on 08/18/2018    08/04/2018 Initial Diagnosis    Rectal cancer metastasized to liver ([place])    08/16/2018 Pathology Results    Biospy  Diagnosis 08/16/18 Liver, needle/core biopsy - ADENOCARCINOMA. Microscopic Comment By morphology, the findings are compatible with the provided clinical history of colorectal adenocarcinoma. Results reported to [name] on 08/18/2018. Intradepartmental consultation was obtained ([name]).     08/16/2018 Miscellaneous    Foundation 1 genomic testing: MS-stable Tumor mutation burden  3/Mb K-ras/NRAS wild-type BRAF V600 E KDMSA R266Q PTEN T39f* SMAD4 loss TP53 loss      08/18/2018 Procedure    Colonoscopy by Dr. HBenson Norwayshowed malignant appearing partially obstructing tumor in the rectum, 8 cm above anal verge.     08/18/2018 - 09/07/2018 Chemotherapy    CAPOX q3weeks with Xeloda 20055mBID 2 weeks on, 1 weeks off starting 08/18/18.  Swithced treatment due to BRAF mutation.     09/07/2018 -  Chemotherapy    FOLFOXIRI and avastin every 2 weeks starting 09/07/18. Oxaliplatin held starting with cycle 8 and continue with FOLFIRI and Avastin.     11/04/2018 Imaging    CT CAP 11/04/18 IMPRESSION: 1. Peripheral filling defect in the right main pulmonary artery extending primarily into the right lower lobe pulmonary artery. While the peripheral location indicates some degree of chronicity of this pulmonary embolus, this is a new finding compared to 07/28/2018. The right ventricular to left ventricular ratio is 0.7, which does not specifically indicate right heart strain. 2. The malignancy appears considerably improved, with reduced size pulmonary nodules; reduced size and increased internal necrosis of the hepatic metastatic lesions; marked improvement in the thoracic adenopathy; reduced conspicuity of the rectal tumor; and significant reduction in perirectal tumor burden/adenopathy. 3. Other imaging findings of potential clinical significance: Aortic Atherosclerosis (ICD10-I70.0). Coronary atherosclerosis. Bilateral renal cysts. Small umbilical hernia contains adipose tissue.  Critical Value/emergent results were called by telephone at the time of interpretation on 11/04/2018 at 3:05 pm to Dr. YATruitt Merle who verbally acknowledged these results.      CURRENT THERAPY:  FOLFOXIRI andAvastinevery 2 weeks starting 09/07/18. Oxaliplatin held starting with cycle 8, continue with FOLFIRI and Avastin.   INTERVAL HISTORY:  [name] here for a follow up and treatment. He presents to the clinic today by himself. He notes he feels weak overall if he is not sitting or at rest. He  notes his BMs are regular and loose. He take lomotil as needed. He denies nausea. He notes having nose bleeds from sinuses that can  last for 30 minutes. He will hold Xarelto if it worsens and then restarts. I reviewed his medication list with him.  He notes he still goes to work at the [employer] but does not have many coworkers and may possibly start working from home.    REVIEW OF SYSTEMS:   Constitutional: Denies fevers, chills or abnormal weight loss Eyes: Denies blurriness of vision Ears, nose, mouth, throat, and face: Denies mucositis or sore throat (+) Nose bleeds Respiratory: Denies cough, dyspnea or wheezes Cardiovascular: Denies palpitation, chest discomfort or lower extremity swelling Gastrointestinal:  Denies nausea, heartburn (+) Lose stool  Skin: Denies abnormal skin rashes Lymphatics: Denies new lymphadenopathy or easy bruising Neurological:Denies numbness, tingling or new weaknesses Behavioral/Psych: Mood is stable, no new changes  All other systems were reviewed with the patient and are negative.  MEDICAL HISTORY:  History reviewed. No pertinent past medical history.  SURGICAL HISTORY: Past Surgical History:  Procedure Laterality Date   IR IMAGING GUIDED PORT INSERTION  08/16/2018   IR US GUIDE BX ASP/DRAIN  08/16/2018    I have reviewed the social history and family history with the patient and they are unchanged from previous note.  ALLERGIES:  has No Known Allergies.  MEDICATIONS:  Current Outpatient Medications  Medication Sig Dispense Refill   amLODipine (NORVASC) 5 MG tablet Take 1 tablet (5 mg total) by mouth 2 (two) times daily. 60 tablet 2   diphenoxylate-atropine (LOMOTIL) 2.5-0.025 MG tablet Take 1-2 tablets by mouth 4 (four) times daily as needed for diarrhea or loose stools. Up to 8 tabs daily for severe diarrhea 60 tablet 2   ibuprofen (ADVIL,MOTRIN) 200 MG tablet Take 200 mg by mouth every 6 (six) hours as needed.     potassium chloride SA (K-DUR,KLOR-CON) 20 MEQ tablet TAKE 1 TABLET(20 MEQ) BY MOUTH DAILY 30 tablet 1   Rivaroxaban 15 & 20 MG TBPK Take 20 mg by mouth  daily. TAKE 20 MG TABLET DAILY 30 each 0   rivaroxaban (XARELTO) 20 MG TABS tablet Take 1 tablet (20 mg total) by mouth daily with supper. 30 tablet 5   Current Facility-Administered Medications  Medication Dose Route Frequency Provider Last Rate Last Dose   sodium chloride flush (NS) 0.9 % injection 10 mL  10 mL Intravenous PRN Truitt Merle, MD   10 mL at 01/04/19 1118    PHYSICAL EXAMINATION: ECOG PERFORMANCE STATUS: 1 - Symptomatic but completely ambulatory  Vitals:   01/04/19 1034  BP: 129/89  Pulse: 84  Resp: 18  Temp: 97.9 F (36.6 C)  SpO2: 99%   Filed Weights   01/04/19 1034  Weight: 243 lb 3.2 oz (110.3 kg)    GENERAL:alert, no distress and comfortable SKIN: skin color, texture, turgor are normal, no rashes or significant lesions EYES: normal, Conjunctiva are pink and non-injected, sclera clear OROPHARYNX:no exudate, no erythema and lips, buccal mucosa, and tongue normal  NECK: supple, thyroid normal size, non-tender, without nodularity LYMPH:  no palpable lymphadenopathy in the cervical, axillary or inguinal LUNGS: clear to auscultation and percussion with normal breathing effort HEART: regular rate & rhythm and no murmurs and no lower extremity edema ABDOMEN:abdomen soft, non-tender and normal bowel sounds Musculoskeletal:no cyanosis of digits and no clubbing  NEURO: alert & oriented x 3 with fluent speech, no focal motor/sensory deficits  LABORATORY DATA:  I have reviewed the data  as listed CBC Latest Ref Rng & Units 01/04/2019 12/21/2018 12/15/2018  WBC 4.0 - 10.5 K/uL 6.8 5.1 6.4  Hemoglobin 13.0 - 17.0 g/dL 9.4(L) 9.2(L) 9.4(L)  Hematocrit 39.0 - 52.0 % 28.9(L) 28.4(L) 28.6(L)  Platelets 150 - 400 K/uL 53(L) 104(L) 74(L)     CMP Latest Ref Rng & Units 01/04/2019 12/21/2018 12/15/2018  Glucose 70 - 99 mg/dL 95 97 91  BUN 8 - 23 mg/dL _0 Creatinine 0.61 - 1.24 mg/dL 0.88 1.02 0.86  Sodium 135 - 145 mmol/L 137 142 141  Potassium 3.5 - 5.1 mmol/L 4.0 3.5  3.2(L)  Chloride 98 - 111 mmol/L 107 110 112(H)  CO2 22 - 32 mmol/L 19(L) 22 18(L)  Calcium 8.9 - 10.3 mg/dL 9.1 8.4(L) 8.6(L)  Total Protein 6.5 - 8.1 g/dL 7.4 6.9 6.8  Total Bilirubin 0.3 - 1.2 mg/dL 0.4 0.4 0.3  Alkaline Phos 38 - 126 U/L 146(H) 140(H) 125  AST 15 - 41 U/L 25 39 24  ALT 0 - 44 U/L 25 48(H) 22      RADIOGRAPHIC STUDIES: I have personally reviewed the radiological images as listed and agreed with the findings in the report. No results found.   ASSESSMENT & PLAN:  Billy Mendoza is a 68 y.o. male with   1. Proximal rectal adenocarcinoma, metastatic to liver and likely tolungsandnodes, MSS,KRAS/NRAS wild type,BRAF V600E mutation (+) -He was diagnosed in10/2019.He completed 1 cycle of CAPOX before changing to FOLFOXIRI andAvastindue to BRAF mutation in his tumor. - Hismetastatic diseaseis not curable, he will continue treatment for as long as he can tolerate to control disease.  -He continues to tolerate FOLFOXIRI moderately well  -If he continues to have excellent response after 9 to 12 months chemo, we can also consider change to maintenance therapy with 5-FU or Xeloda with Avastin. -He continues to moderately tolerate treatment with loose stool, fatigue and weakness and nose bleeds.  -Labs reviewed, CBC WNL except hg at 9.4, PLT 53K, CMPWNL. Continue to monitor CEA every 4 weeks. Given significant thrombocytopenia will hold chemo today. I encouraged him to avoid falls or injury given his risk of bleeding.  -I discussed further decreasing his chemo dose or changing to FOLFIRI for him to tolerate better. After a lengthy discussion, we decided to hold Oxaliplatin and continue with FOLFIRI and Avastin. Start cycle 8 on 4/16.  -He still goes to work. I reviewed COVID-19 precautions.  -Next scan in Mid-May, will continue checking CEA every 4 weeks -F/u in 2 weeks   2. Right PE -His PE is likely related to his underlying metastatic colon cancer and chemotherapy,  especially Avastin. -He is on Xarelto 20 mg daily and will continue indefinitely. Has occasionally epistaxis secondary to Avastin, thrombocytopenia and Xarelto.    3. Pancytopeniasecondary to chemotherapy -Lab reviewed, platelet count 74, will hold chemotherapy today and postpone to next week -WBC normal today, hg 9.2, PLT at 53K. Will further reduce chemo dose.   4. HTN  -TakesAmlodipine - BP overall controlled   5. Goal of care discussion -He is full code now -He understands his disease is incurable,and the goal of therapy is palliative to prolong his life.   Plan  -I refilled Xarelto 1m daily today -Due to significant thrombocytopenia, I will hold chemo today and change chemo to FOLFIRI and Avastin with next cycle in 2 weeks  -lab, flush, f/u and FOLFIRI/Avastin in 2 weeks  -I will call his wife in later afternoon to update her  No problem-specific Assessment & Plan notes found for this encounter.   No orders of the defined types were placed in this encounter.  All questions were answered. The patient knows to call the clinic with any problems, questions or concerns. No barriers to learning was detected. I spent 25 minutes counseling the patient face to face. The total time spent in the appointment was 30 minutes and more than 50% was on counseling and review of test results     Truitt Merle, MD 01/04/2019   I, Joslyn Devon, am acting as scribe for Truitt Merle, MD.   I have reviewed the above documentation for accuracy and completeness, and I agree with the above.

## 2019-01-02 NOTE — Telephone Encounter (Signed)
Left voice message for patient that CT will be ordered on Vitaly's next visit, usually will be done every 3 to 4 months, last one was 1/31.  So will try to get end of April or first of May.

## 2019-01-03 ENCOUNTER — Other Ambulatory Visit: Payer: Self-pay

## 2019-01-03 ENCOUNTER — Inpatient Hospital Stay: Payer: BLUE CROSS/BLUE SHIELD

## 2019-01-03 VITALS — BP 131/95 | HR 95 | Resp 18

## 2019-01-03 DIAGNOSIS — Z5112 Encounter for antineoplastic immunotherapy: Secondary | ICD-10-CM | POA: Diagnosis not present

## 2019-01-03 DIAGNOSIS — Z95828 Presence of other vascular implants and grafts: Secondary | ICD-10-CM

## 2019-01-03 MED ORDER — TBO-FILGRASTIM 480 MCG/0.8ML ~~LOC~~ SOSY
PREFILLED_SYRINGE | SUBCUTANEOUS | Status: AC
  Filled 2019-01-03: qty 0.8

## 2019-01-03 MED ORDER — TBO-FILGRASTIM 480 MCG/0.8ML ~~LOC~~ SOSY
480.0000 ug | PREFILLED_SYRINGE | Freq: Once | SUBCUTANEOUS | Status: AC
  Administered 2019-01-03: 480 ug via SUBCUTANEOUS

## 2019-01-03 NOTE — Patient Instructions (Signed)
Tbo-Filgrastim injection What is this medicine? TBO-FILGRASTIM (T B O fil GRA stim) is a granulocyte colony-stimulating factor that stimulates the growth of neutrophils, a type of white blood cell important in the body's fight against infection. It is used to reduce the incidence of fever and infection in patients with certain types of cancer who are receiving chemotherapy that affects the bone marrow. This medicine may be used for other purposes; ask your health care provider or pharmacist if you have questions. COMMON BRAND NAME(S): Granix What should I tell my health care provider before I take this medicine? They need to know if you have any of these conditions: -bone scan or tests planned -kidney disease -sickle cell anemia -an unusual or allergic reaction to tbo-filgrastim, filgrastim, pegfilgrastim, other medicines, foods, dyes, or preservatives -pregnant or trying to get pregnant -breast-feeding How should I use this medicine? This medicine is for injection under the skin. If you get this medicine at home, you will be taught how to prepare and give this medicine. Refer to the Instructions for Use that come with your medication packaging. Use exactly as directed. Take your medicine at regular intervals. Do not take your medicine more often than directed. It is important that you put your used needles and syringes in a special sharps container. Do not put them in a trash can. If you do not have a sharps container, call your pharmacist or healthcare provider to get one. Talk to your pediatrician regarding the use of this medicine in children. While this drug may be prescribed for children as young as 1 month of age for selected conditions, precautions do apply. Overdosage: If you think you have taken too much of this medicine contact a poison control center or emergency room at once. NOTE: This medicine is only for you. Do not share this medicine with others. What if I miss a dose? It is  important not to miss your dose. Call your doctor or health care professional if you miss a dose. What may interact with this medicine? This medicine may interact with the following medications: -medicines that may cause a release of neutrophils, such as lithium This list may not describe all possible interactions. Give your health care provider a list of all the medicines, herbs, non-prescription drugs, or dietary supplements you use. Also tell them if you smoke, drink alcohol, or use illegal drugs. Some items may interact with your medicine. What should I watch for while using this medicine? You may need blood work done while you are taking this medicine. What side effects may I notice from receiving this medicine? Side effects that you should report to your doctor or health care professional as soon as possible: -allergic reactions like skin rash, itching or hives, swelling of the face, lips, or tongue -back pain -blood in the urine -dark urine -dizziness -fast heartbeat -feeling faint -shortness of breath or breathing problems -signs and symptoms of infection like fever or chills; cough; or sore throat -signs and symptoms of kidney injury like trouble passing urine or change in the amount of urine -stomach or side pain, or pain at the shoulder -sweating -swelling of the legs, ankles, or abdomen -tiredness Side effects that usually do not require medical attention (report to your doctor or health care professional if they continue or are bothersome): -bone pain -diarrhea -headache -muscle pain -vomiting This list may not describe all possible side effects. Call your doctor for medical advice about side effects. You may report side effects to FDA at   1-800-FDA-1088. Where should I keep my medicine? Keep out of the reach of children. Store in a refrigerator between 2 and 8 degrees C (36 and 46 degrees F). Keep in carton to protect from light. Throw away this medicine if it is left out  of the refrigerator for more than 5 consecutive days. Throw away any unused medicine after the expiration date. NOTE: This sheet is a summary. It may not cover all possible information. If you have questions about this medicine, talk to your doctor, pharmacist, or health care provider.  2019 Elsevier/Gold Standard (2017-05-11 16:56:18)  

## 2019-01-04 ENCOUNTER — Inpatient Hospital Stay (HOSPITAL_BASED_OUTPATIENT_CLINIC_OR_DEPARTMENT_OTHER): Payer: BLUE CROSS/BLUE SHIELD | Admitting: Hematology

## 2019-01-04 ENCOUNTER — Inpatient Hospital Stay: Payer: BLUE CROSS/BLUE SHIELD | Attending: Adult Health

## 2019-01-04 ENCOUNTER — Inpatient Hospital Stay: Payer: BLUE CROSS/BLUE SHIELD

## 2019-01-04 ENCOUNTER — Other Ambulatory Visit: Payer: Self-pay

## 2019-01-04 ENCOUNTER — Encounter: Payer: Self-pay | Admitting: Hematology

## 2019-01-04 VITALS — BP 129/89 | HR 84 | Temp 97.9°F | Resp 18 | Ht 73.0 in | Wt 243.2 lb

## 2019-01-04 DIAGNOSIS — C20 Malignant neoplasm of rectum: Secondary | ICD-10-CM

## 2019-01-04 DIAGNOSIS — I2699 Other pulmonary embolism without acute cor pulmonale: Secondary | ICD-10-CM

## 2019-01-04 DIAGNOSIS — I10 Essential (primary) hypertension: Secondary | ICD-10-CM | POA: Diagnosis not present

## 2019-01-04 DIAGNOSIS — D61810 Antineoplastic chemotherapy induced pancytopenia: Secondary | ICD-10-CM

## 2019-01-04 DIAGNOSIS — Z7901 Long term (current) use of anticoagulants: Secondary | ICD-10-CM | POA: Diagnosis not present

## 2019-01-04 DIAGNOSIS — C787 Secondary malignant neoplasm of liver and intrahepatic bile duct: Secondary | ICD-10-CM

## 2019-01-04 DIAGNOSIS — D500 Iron deficiency anemia secondary to blood loss (chronic): Secondary | ICD-10-CM

## 2019-01-04 DIAGNOSIS — Z5112 Encounter for antineoplastic immunotherapy: Secondary | ICD-10-CM | POA: Diagnosis not present

## 2019-01-04 DIAGNOSIS — Z95828 Presence of other vascular implants and grafts: Secondary | ICD-10-CM

## 2019-01-04 LAB — CMP (CANCER CENTER ONLY)
ALT: 25 U/L (ref 0–44)
AST: 25 U/L (ref 15–41)
Albumin: 3.4 g/dL — ABNORMAL LOW (ref 3.5–5.0)
Alkaline Phosphatase: 146 U/L — ABNORMAL HIGH (ref 38–126)
Anion gap: 11 (ref 5–15)
BUN: 13 mg/dL (ref 8–23)
CO2: 19 mmol/L — ABNORMAL LOW (ref 22–32)
Calcium: 9.1 mg/dL (ref 8.9–10.3)
Chloride: 107 mmol/L (ref 98–111)
Creatinine: 0.88 mg/dL (ref 0.61–1.24)
GFR, Est AFR Am: >60 mL/min (ref >60)
GFR, Estimated: >60 mL/min (ref >60)
Glucose, Bld: 95 mg/dL (ref 70–99)
Potassium: 4 mmol/L (ref 3.5–5.1)
Sodium: 137 mmol/L (ref 135–145)
Total Bilirubin: 0.4 mg/dL (ref 0.3–1.2)
Total Protein: 7.4 g/dL (ref 6.5–8.1)

## 2019-01-04 LAB — CBC WITH DIFFERENTIAL (CANCER CENTER ONLY)
Abs Immature Granulocytes: 0.32 10*3/uL — ABNORMAL HIGH (ref 0.00–0.07)
Basophils Absolute: 0 10*3/uL (ref 0.0–0.1)
Basophils Relative: 0 %
Eosinophils Absolute: 0.2 10*3/uL (ref 0.0–0.5)
Eosinophils Relative: 3 %
HCT: 28.9 % — ABNORMAL LOW (ref 39.0–52.0)
Hemoglobin: 9.4 g/dL — ABNORMAL LOW (ref 13.0–17.0)
Immature Granulocytes: 5 %
Lymphocytes Relative: 31 %
Lymphs Abs: 2.1 10*3/uL (ref 0.7–4.0)
MCH: 34.9 pg — ABNORMAL HIGH (ref 26.0–34.0)
MCHC: 32.5 g/dL (ref 30.0–36.0)
MCV: 107.4 fL — ABNORMAL HIGH (ref 80.0–100.0)
Monocytes Absolute: 0.7 10*3/uL (ref 0.1–1.0)
Monocytes Relative: 11 %
Neutro Abs: 3.5 10*3/uL (ref 1.7–7.7)
Neutrophils Relative %: 50 %
Platelet Count: 53 10*3/uL — ABNORMAL LOW (ref 150–400)
RBC: 2.69 MIL/uL — ABNORMAL LOW (ref 4.22–5.81)
RDW: 19.4 % — ABNORMAL HIGH (ref 11.5–15.5)
WBC Count: 6.8 10*3/uL (ref 4.0–10.5)
nRBC: 0 % (ref 0.0–0.2)

## 2019-01-04 LAB — TOTAL PROTEIN, URINE DIPSTICK: Protein, ur: NEGATIVE mg/dL

## 2019-01-04 MED ORDER — SODIUM CHLORIDE 0.9% FLUSH
10.0000 mL | INTRAVENOUS | Status: DC | PRN
  Administered 2019-01-04: 10 mL via INTRAVENOUS
  Filled 2019-01-04: qty 10

## 2019-01-04 MED ORDER — RIVAROXABAN 20 MG PO TABS: 20.0000 mg | ORAL_TABLET | Freq: Every day | ORAL | 5 refills | Status: DC

## 2019-01-04 MED ORDER — HEPARIN SOD (PORK) LOCK FLUSH 100 UNIT/ML IV SOLN
500.0000 [IU] | Freq: Once | INTRAVENOUS | Status: AC
  Administered 2019-01-04: 500 [IU] via INTRAVENOUS
  Filled 2019-01-04: qty 5

## 2019-01-04 MED ORDER — SODIUM CHLORIDE 0.9% FLUSH
10.0000 mL | Freq: Once | INTRAVENOUS | Status: AC | PRN
  Administered 2019-01-04: 10:00:00 10 mL
  Filled 2019-01-04: qty 10

## 2019-01-05 ENCOUNTER — Telehealth: Payer: Self-pay | Admitting: Hematology

## 2019-01-05 NOTE — Telephone Encounter (Signed)
No los per 4/1 °

## 2019-01-12 ENCOUNTER — Ambulatory Visit: Payer: BLUE CROSS/BLUE SHIELD

## 2019-01-12 ENCOUNTER — Ambulatory Visit: Payer: BLUE CROSS/BLUE SHIELD | Admitting: Hematology

## 2019-01-12 ENCOUNTER — Other Ambulatory Visit: Payer: BLUE CROSS/BLUE SHIELD

## 2019-01-18 ENCOUNTER — Inpatient Hospital Stay (HOSPITAL_COMMUNITY)
Admission: EM | Admit: 2019-01-18 | Discharge: 2019-01-20 | DRG: 375 | Disposition: A | Discharge status: Home / Self Care | Payer: BLUE CROSS/BLUE SHIELD | Attending: Internal Medicine | Admitting: Internal Medicine

## 2019-01-18 ENCOUNTER — Telehealth: Payer: Self-pay

## 2019-01-18 ENCOUNTER — Other Ambulatory Visit: Payer: Self-pay

## 2019-01-18 ENCOUNTER — Emergency Department (HOSPITAL_COMMUNITY): Payer: BLUE CROSS/BLUE SHIELD

## 2019-01-18 ENCOUNTER — Encounter (HOSPITAL_COMMUNITY): Payer: Self-pay | Admitting: Emergency Medicine

## 2019-01-18 DIAGNOSIS — C20 Malignant neoplasm of rectum: Secondary | ICD-10-CM | POA: Diagnosis present

## 2019-01-18 DIAGNOSIS — C7801 Secondary malignant neoplasm of right lung: Secondary | ICD-10-CM | POA: Diagnosis present

## 2019-01-18 DIAGNOSIS — T451X5A Adverse effect of antineoplastic and immunosuppressive drugs, initial encounter: Secondary | ICD-10-CM | POA: Diagnosis not present

## 2019-01-18 DIAGNOSIS — Z8249 Family history of ischemic heart disease and other diseases of the circulatory system: Secondary | ICD-10-CM | POA: Diagnosis not present

## 2019-01-18 DIAGNOSIS — E876 Hypokalemia: Secondary | ICD-10-CM | POA: Diagnosis present

## 2019-01-18 DIAGNOSIS — Z86711 Personal history of pulmonary embolism: Secondary | ICD-10-CM | POA: Diagnosis not present

## 2019-01-18 DIAGNOSIS — C189 Malignant neoplasm of colon, unspecified: Secondary | ICD-10-CM

## 2019-01-18 DIAGNOSIS — Z801 Family history of malignant neoplasm of trachea, bronchus and lung: Secondary | ICD-10-CM | POA: Diagnosis not present

## 2019-01-18 DIAGNOSIS — Z6832 Body mass index (BMI) 32.0-32.9, adult: Secondary | ICD-10-CM | POA: Diagnosis not present

## 2019-01-18 DIAGNOSIS — K56609 Unspecified intestinal obstruction, unspecified as to partial versus complete obstruction: Secondary | ICD-10-CM | POA: Diagnosis present

## 2019-01-18 DIAGNOSIS — Z7901 Long term (current) use of anticoagulants: Secondary | ICD-10-CM | POA: Diagnosis not present

## 2019-01-18 DIAGNOSIS — D509 Iron deficiency anemia, unspecified: Secondary | ICD-10-CM | POA: Diagnosis not present

## 2019-01-18 DIAGNOSIS — E669 Obesity, unspecified: Secondary | ICD-10-CM | POA: Diagnosis present

## 2019-01-18 DIAGNOSIS — D6481 Anemia due to antineoplastic chemotherapy: Secondary | ICD-10-CM | POA: Diagnosis present

## 2019-01-18 DIAGNOSIS — I2699 Other pulmonary embolism without acute cor pulmonale: Secondary | ICD-10-CM | POA: Diagnosis not present

## 2019-01-18 DIAGNOSIS — Z79899 Other long term (current) drug therapy: Secondary | ICD-10-CM

## 2019-01-18 DIAGNOSIS — C787 Secondary malignant neoplasm of liver and intrahepatic bile duct: Secondary | ICD-10-CM | POA: Diagnosis present

## 2019-01-18 DIAGNOSIS — C7800 Secondary malignant neoplasm of unspecified lung: Secondary | ICD-10-CM | POA: Diagnosis not present

## 2019-01-18 DIAGNOSIS — Z87891 Personal history of nicotine dependence: Secondary | ICD-10-CM

## 2019-01-18 DIAGNOSIS — Z803 Family history of malignant neoplasm of breast: Secondary | ICD-10-CM | POA: Diagnosis not present

## 2019-01-18 DIAGNOSIS — I10 Essential (primary) hypertension: Secondary | ICD-10-CM | POA: Diagnosis present

## 2019-01-18 DIAGNOSIS — D500 Iron deficiency anemia secondary to blood loss (chronic): Secondary | ICD-10-CM | POA: Diagnosis present

## 2019-01-18 DIAGNOSIS — Z95828 Presence of other vascular implants and grafts: Secondary | ICD-10-CM

## 2019-01-18 HISTORY — DX: Malignant (primary) neoplasm, unspecified: C80.1

## 2019-01-18 LAB — CBC WITH DIFFERENTIAL/PLATELET
Abs Immature Granulocytes: 0.02 10*3/uL (ref 0.00–0.07)
Basophils Absolute: 0 10*3/uL (ref 0.0–0.1)
Basophils Relative: 0 %
Eosinophils Absolute: 0 10*3/uL (ref 0.0–0.5)
Eosinophils Relative: 0 %
HCT: 31.1 % — ABNORMAL LOW (ref 39.0–52.0)
Hemoglobin: 9.9 g/dL — ABNORMAL LOW (ref 13.0–17.0)
Immature Granulocytes: 0 %
Lymphocytes Relative: 13 %
Lymphs Abs: 0.8 10*3/uL (ref 0.7–4.0)
MCH: 36.1 pg — ABNORMAL HIGH (ref 26.0–34.0)
MCHC: 31.8 g/dL (ref 30.0–36.0)
MCV: 113.5 fL — ABNORMAL HIGH (ref 80.0–100.0)
Monocytes Absolute: 0.6 10*3/uL (ref 0.1–1.0)
Monocytes Relative: 8 %
Neutro Abs: 5.3 10*3/uL (ref 1.7–7.7)
Neutrophils Relative %: 79 %
Platelets: 180 10*3/uL (ref 150–400)
RBC: 2.74 MIL/uL — ABNORMAL LOW (ref 4.22–5.81)
RDW: 18.6 % — ABNORMAL HIGH (ref 11.5–15.5)
WBC: 6.7 10*3/uL (ref 4.0–10.5)
nRBC: 0 % (ref 0.0–0.2)

## 2019-01-18 LAB — COMPREHENSIVE METABOLIC PANEL
ALT: 28 U/L (ref 0–44)
AST: 25 U/L (ref 15–41)
Albumin: 3.1 g/dL — ABNORMAL LOW (ref 3.5–5.0)
Alkaline Phosphatase: 135 U/L — ABNORMAL HIGH (ref 38–126)
Anion gap: 10 (ref 5–15)
BUN: 18 mg/dL (ref 8–23)
CO2: 18 mmol/L — ABNORMAL LOW (ref 22–32)
Calcium: 7.5 mg/dL — ABNORMAL LOW (ref 8.9–10.3)
Chloride: 111 mmol/L (ref 98–111)
Creatinine, Ser: 0.76 mg/dL (ref 0.61–1.24)
GFR calc Af Amer: >60 mL/min (ref >60)
GFR calc non Af Amer: >60 mL/min (ref >60)
Glucose, Bld: 133 mg/dL — ABNORMAL HIGH (ref 70–99)
Potassium: 2.4 mmol/L — CL (ref 3.5–5.1)
Sodium: 139 mmol/L (ref 135–145)
Total Bilirubin: 0.8 mg/dL (ref 0.3–1.2)
Total Protein: 6.7 g/dL (ref 6.5–8.1)

## 2019-01-18 LAB — APTT: aPTT: 39 seconds — ABNORMAL HIGH (ref 24–36)

## 2019-01-18 LAB — TYPE AND SCREEN
ABO/RH(D): O POS
Antibody Screen: NEGATIVE

## 2019-01-18 LAB — PROTIME-INR
INR: 1.3 — ABNORMAL HIGH (ref 0.8–1.2)
Prothrombin Time: 16.3 seconds — ABNORMAL HIGH (ref 11.4–15.2)

## 2019-01-18 LAB — HEPARIN LEVEL (UNFRACTIONATED): Heparin Unfractionated: 1.84 IU/mL — ABNORMAL HIGH (ref 0.30–0.70)

## 2019-01-18 LAB — POC OCCULT BLOOD, ED: Fecal Occult Bld: NEGATIVE

## 2019-01-18 LAB — LIPASE, BLOOD: Lipase: 27 U/L (ref 11–51)

## 2019-01-18 MED ORDER — ACETAMINOPHEN 325 MG PO TABS: 650.0000 mg | ORAL_TABLET | Freq: Four times a day (QID) | ORAL | Status: DC | PRN

## 2019-01-18 MED ORDER — SODIUM CHLORIDE 0.9 % IV BOLUS
1000.0000 mL | Freq: Once | INTRAVENOUS | Status: AC
  Administered 2019-01-18: 1000 mL via INTRAVENOUS

## 2019-01-18 MED ORDER — ONDANSETRON HCL 4 MG/2ML IJ SOLN
4.0000 mg | Freq: Four times a day (QID) | INTRAMUSCULAR | Status: DC | PRN
  Administered 2019-01-18 – 2019-01-19 (×3): 4 mg via INTRAVENOUS
  Filled 2019-01-18 (×3): qty 2

## 2019-01-18 MED ORDER — SODIUM CHLORIDE (PF) 0.9 % IJ SOLN
INTRAMUSCULAR | Status: AC
  Administered 2019-01-18: 21:00:00
  Filled 2019-01-18: qty 50

## 2019-01-18 MED ORDER — SODIUM CHLORIDE 0.9 % IV SOLN
INTRAVENOUS | Status: DC
  Administered 2019-01-18 – 2019-01-20 (×5): via INTRAVENOUS

## 2019-01-18 MED ORDER — POLYETHYLENE GLYCOL 3350 17 G PO PACK
17.0000 g | PACK | Freq: Two times a day (BID) | ORAL | Status: DC
  Administered 2019-01-18: 22:00:00 17 g via ORAL
  Filled 2019-01-18: qty 1

## 2019-01-18 MED ORDER — POTASSIUM CHLORIDE CRYS ER 20 MEQ PO TBCR
40.0000 meq | EXTENDED_RELEASE_TABLET | Freq: Once | ORAL | Status: AC
  Administered 2019-01-18: 20:00:00 40 meq via ORAL
  Filled 2019-01-18: qty 2

## 2019-01-18 MED ORDER — ONDANSETRON HCL 4 MG PO TABS: 4.0000 mg | ORAL_TABLET | Freq: Four times a day (QID) | ORAL | Status: DC | PRN

## 2019-01-18 MED ORDER — IOHEXOL 300 MG/ML  SOLN
100.0000 mL | Freq: Once | INTRAMUSCULAR | Status: AC | PRN
  Administered 2019-01-18: 21:00:00 100 mL via INTRAVENOUS

## 2019-01-18 MED ORDER — POTASSIUM CHLORIDE CRYS ER 20 MEQ PO TBCR
40.0000 meq | EXTENDED_RELEASE_TABLET | Freq: Once | ORAL | Status: AC
  Administered 2019-01-18: 22:00:00 40 meq via ORAL
  Filled 2019-01-18: qty 2

## 2019-01-18 MED ORDER — POTASSIUM CHLORIDE 10 MEQ/100ML IV SOLN
10.0000 meq | INTRAVENOUS | Status: AC
  Administered 2019-01-18 (×3): 10 meq via INTRAVENOUS
  Filled 2019-01-18 (×3): qty 100

## 2019-01-18 MED ORDER — ACETAMINOPHEN 650 MG RE SUPP: 650.0000 mg | Freq: Four times a day (QID) | RECTAL | Status: DC | PRN

## 2019-01-18 MED ORDER — HYDRALAZINE HCL 20 MG/ML IJ SOLN: 5.0000 mg | INTRAMUSCULAR | Status: DC | PRN

## 2019-01-18 MED ORDER — HEPARIN (PORCINE) 25000 UT/250ML-% IV SOLN
1600.0000 [IU]/h | INTRAVENOUS | Status: DC
  Administered 2019-01-19: 01:00:00 1600 [IU]/h via INTRAVENOUS
  Filled 2019-01-18 (×2): qty 250

## 2019-01-18 MED ORDER — ONDANSETRON HCL 4 MG/2ML IJ SOLN
4.0000 mg | Freq: Once | INTRAMUSCULAR | Status: AC
  Administered 2019-01-18: 19:00:00 4 mg via INTRAVENOUS
  Filled 2019-01-18: qty 2

## 2019-01-18 MED ORDER — MORPHINE SULFATE (PF) 2 MG/ML IV SOLN
2.0000 mg | INTRAVENOUS | Status: DC | PRN
  Administered 2019-01-18 – 2019-01-19 (×4): 2 mg via INTRAVENOUS
  Filled 2019-01-18 (×4): qty 1

## 2019-01-18 MED ORDER — MAGNESIUM SULFATE IN D5W 1-5 GM/100ML-% IV SOLN
1.0000 g | Freq: Once | INTRAVENOUS | Status: AC
  Administered 2019-01-18: 23:00:00 1 g via INTRAVENOUS
  Filled 2019-01-18: qty 100

## 2019-01-18 NOTE — H&P (Addendum)
History and Physical    Billy Mendoza VWU:[id number] DOB: December 12, 1950 DOA: 01/18/2019  Referring MD/NP/PA:   PCP: Merry Proud, MD   Patient coming from:  The patient is coming from home.  At baseline, pt is independent for most of ADL.        Chief Complaint: Nausea, vomiting, abdominal pain, constipation  HPI: Billy Mendoza is a 68 y.o. male with medical history significant of rectal colon cancer metastasized to liver and lung on chemotherapy, hypertension, iron deficiency anemia, PE on Xarelto, who presents with nausea, vomiting, abdominal pain and constipation.  Patient states that he has been constipated in the past 7 days.  He has not had bowel movement for 7 days.  He developed nausea, vomiting and abdominal pain.  He vomited twice with non-biliary nonbloody vomitus today.  Her abdominal pain is located mainly in lower abdomen, cramping-like, intermittent, 8 out of 10 severity, nonradiating.  Patient states that she still passing gas intermittently.  No diarrhea. He tried to use magnesium citrate and MiraLAX without significant help. Denies fever or chills.  Patient does not have chest pain, shortness breath, cough, fever or chills.  Denies symptoms of UTI or unilateral weakness.  No sick contact to COVID-19 personnel.  ED Course: pt was found to have WBC 6.7, lipase 27, negative FOBT, potassium 2.4, renal function normal, temperature normal, slightly tachycardia, no tachypnea, oxygen saturation 95% on room air.  CT abdomen/pelvis showed colonic obstruction. Pt is admitted to [place] bed as inpatient.  General surgeon, Dr. Ninfa Linden was consulted by EDP.  Likely do surgery tomorrow.  CT-abdomen/pelvis: 1. Colonic obstruction resulting from rectal carcinoma. 2. Hepatic metastatic disease which is decreased in size compared with the prior examination of 11/04/2018. 3. Multiple right middle lobe and right lower lobe pulmonary nodules most consistent with metastatic disease.  Review of  Systems:   General: no fevers, chills, no body weight gain, has poor appetite, has fatigue HEENT: no blurry vision, hearing changes or sore throat Respiratory: no dyspnea, coughing, wheezing CV: no chest pain, no palpitations GI: has nausea, vomiting, abdominal pain, constipation GU: no dysuria, burning on urination, increased urinary frequency, hematuria  Ext: no leg edema Neuro: no unilateral weakness, numbness, or tingling, no vision change or hearing loss Skin: no rash, no skin tear. MSK: No muscle spasm, no deformity, no limitation of range of movement in spin Heme: No easy bruising.  Travel history: No recent long distant travel.  Allergy: No Known Allergies  Past Medical History:  Diagnosis Date  . Cancer [facility])     Past Surgical History:  Procedure Laterality Date  . IR IMAGING GUIDED PORT INSERTION  08/16/2018  . IR US GUIDE BX ASP/DRAIN  08/16/2018    Social History:  reports that he has quit smoking. His smoking use included cigarettes and cigars. He quit after 10.00 years of use. He has never used smokeless tobacco. He reports current alcohol use of about 1.0 - 2.0 standard drinks of alcohol per week. He reports that he does not use drugs.  Family History:  Family History  Problem Relation Age of Onset  . Cancer Mother 17       breast cancer  . Atrial fibrillation Father   . Heart failure Father   . Cancer Maternal Grandmother        lung cancer      Prior to Admission medications   Medication Sig Start Date End Date Taking? Authorizing Provider  acetaminophen (TYLENOL) 325 MG tablet Take 650 mg by  mouth every 6 (six) hours as needed for moderate pain.   Yes [provider]  amLODipine (NORVASC) 5 MG tablet Take 1 tablet (5 mg total) by mouth 2 (two) times daily. 11/09/18  Yes Truitt Merle, MD  diphenoxylate-atropine (LOMOTIL) 2.5-0.025 MG tablet Take 1-2 tablets by mouth 4 (four) times daily as needed for diarrhea or loose stools. Up to 8 tabs daily for  severe diarrhea 09/07/18  Yes Truitt Merle, MD  potassium chloride SA (K-DUR,KLOR-CON) 20 MEQ tablet TAKE 1 TABLET(20 MEQ) BY MOUTH DAILY 12/19/18  Yes Truitt Merle, MD  rivaroxaban (XARELTO) 20 MG TABS tablet Take 1 tablet (20 mg total) by mouth daily with supper. 01/04/19  Yes Truitt Merle, MD  VITAMIN E PO Take 1 tablet by mouth daily.   Yes [provider]  prochlorperazine (COMPAZINE) 10 MG tablet Take 1 tablet (10 mg total) by mouth every 6 (six) hours as needed (Nausea or vomiting). Patient not taking: Reported on 08/30/2018 08/04/18 09/01/18  Truitt Merle, MD    Physical Exam: Vitals:   01/18/19 2150 01/18/19 2200 01/18/19 2224 01/18/19 2230  BP:   (!) 153/101   Pulse: 90  90 89  Resp:   (!) 9 13  Temp:      SpO2: 98%  97% 97%  Weight:  110.2 kg     General: Not in acute distress HEENT:       Eyes: PERRL, EOMI, no scleral icterus.       ENT: No discharge from the ears and nose, no pharynx injection, no tonsillar enlargement.        Neck: No JVD, no bruit, no mass felt. Heme: No neck lymph node enlargement. Cardiac: S1/S2, RRR, No murmurs, No gallops or rubs. Respiratory: No rales, wheezing, rhonchi or rubs. GI: Soft, mildly distended, has tenderness in lower abdomen, no rebound pain, no organomegaly, BS present. GU: No hematuria Ext: No pitting leg edema bilaterally. 2+DP/PT pulse bilaterally. Musculoskeletal: No joint deformities, No joint redness or warmth, no limitation of ROM in spin. Skin: No rashes.   Neuro: Alert, oriented X3, cranial nerves II-XII grossly intact, moves all extremities normally.  Psych: Patient is not psychotic, no suicidal or hemocidal ideation.  Labs on Admission: I have personally reviewed following labs and imaging studies  CBC: Recent Labs  Lab 01/18/19 1928  WBC 6.7  NEUTROABS 5.3  HGB 9.9*  HCT 31.1*  MCV 113.5*  PLT 062   Basic Metabolic Panel: Recent Labs  Lab 01/18/19 1928  NA 139  K 2.4*  CL 111  CO2 18*  GLUCOSE 133*  BUN 18   CREATININE 0.76  CALCIUM 7.5*   GFR: Estimated Creatinine Clearance: 119.8 mL/min (by C-G formula based on SCr of 0.76 mg/dL). Liver Function Tests: Recent Labs  Lab 01/18/19 1928  AST 25  ALT 28  ALKPHOS 135*  BILITOT 0.8  PROT 6.7  ALBUMIN 3.1*   Recent Labs  Lab 01/18/19 1928  LIPASE 27   No results for input(s): AMMONIA in the last 168 hours. Coagulation Profile: No results for input(s): INR, PROTIME in the last 168 hours. Cardiac Enzymes: No results for input(s): CKTOTAL, CKMB, CKMBINDEX, TROPONINI in the last 168 hours. BNP (last 3 results) No results for input(s): PROBNP in the last 8760 hours. HbA1C: No results for input(s): HGBA1C in the last 72 hours. CBG: No results for input(s): GLUCAP in the last 168 hours. Lipid Profile: No results for input(s): CHOL, HDL, LDLCALC, TRIG, CHOLHDL, LDLDIRECT in the last 72 hours.  Thyroid Function Tests: No results for input(s): TSH, T4TOTAL, FREET4, T3FREE, THYROIDAB in the last 72 hours. Anemia Panel: No results for input(s): VITAMINB12, FOLATE, FERRITIN, TIBC, IRON, RETICCTPCT in the last 72 hours. Urine analysis:    Component Value Date/Time   PROTEINUR NEGATIVE 01/04/2019 0954   Sepsis Labs: @LABRCNTIP (procalcitonin:4,lacticidven:4) )No results found for this or any previous visit (from the past 240 hour(s)).   Radiological Exams on Admission: Ct Abdomen Pelvis W Contrast  Result Date: 01/18/2019 CLINICAL DATA:  No bowel movement for 1 week, abdominal pain EXAM: CT ABDOMEN AND PELVIS WITH CONTRAST TECHNIQUE: Multidetector CT imaging of the abdomen and pelvis was performed using the standard protocol following bolus administration of intravenous contrast. CONTRAST:  121mL OMNIPAQUE IOHEXOL 300 MG/ML  SOLN COMPARISON:  11/04/2018 FINDINGS: Lower chest: Right middle lobe pulmonary nodules with the largest measuring 10 mm enlarged compared with the prior examination. 5 mm right lower lobe pulmonary nodule.  Hepatobiliary: Multiple hypodense hepatic masses with the largest in the right hepatic lobe near the dome of the liver measuring 4.3 x 2.8 cm decreased in size compared with 11/04/2018. No gallstones, gallbladder wall thickening, or biliary dilatation. Pancreas: Unremarkable. No pancreatic ductal dilatation or surrounding inflammatory changes. Spleen: Normal in size without focal abnormality. Adrenals/Urinary Tract: Adrenal glands are unremarkable. Bilateral hypodense, fluid attenuating renal masses consistent with cysts. No urolithiasis or obstructive uropathy. Bladder is unremarkable. Stomach/Bowel: Stomach is normal. Severe colonic distension to the level of the rectum. Colon is fluid-filled. Focal area of rectal wall thickening most consistent with rectal carcinoma with perirectal tumor along the left lateral aspect measuring 1.5 x 1.2 cm. Stable presacral edema. No pneumatosis, pneumoperitoneum or portal venous gas. Vascular/Lymphatic: Abdominal aortic atherosclerosis. Normal caliber abdominal aorta. No lymphadenopathy. Reproductive: Prostate is unremarkable. Other: No fluid collection or hematoma. Fat containing right inguinal hernia. Musculoskeletal: No acute osseous abnormality. No aggressive osseous lesion. IMPRESSION: 1. Colonic obstruction resulting from rectal carcinoma. 2. Hepatic metastatic disease which is decreased in size compared with the prior examination of 11/04/2018. 3. Multiple right middle lobe and right lower lobe pulmonary nodules most consistent with metastatic disease. Electronically Signed   By: Kathreen Devoid   On: 01/18/2019 21:11     EKG: Reviewed independently, sinus rhythm, QTC 423, low voltage, mild T wave inversion in V4-V6, mild ST depression in inferior leads.  Assessment/Plan Principal Problem:   Large bowel obstruction (HCC) Active Problems:   Rectal cancer metastasized to liver (HCC)   Port-A-Cath in place   Iron deficiency anemia due to chronic blood loss    Hypokalemia   PE (pulmonary thromboembolism) (HCC)   Large bowel obstruction [facility]): Patient's abdominal pain, nausea vomiting and constipation are caused by large bowel obstruction as evidenced by CT scanning.  Currently patient does not have active vomiting.  Will hold off NG tube. If pt develops worsening nausea and vomiting, will start NG tube later. Dr. Ninfa Linden of general surgeon was consulted by EDP.  Likely to surgery tomorrow.  -Admit to med surg bed as inpt -NPO   -prn NG tube (not started yet) -morphine prn pain -Prn Zofran prn nausea   -IVF: 1L NS, then 125 cc/h -INR/PTT/type & screen -Follow-up general surgeons recommendation  Hx of PE: pt is on Xarelto. No CP or SOB. -switch Xarelto to IV Heparin per pharm  Rectal cancer metastasized to liver and lung: on chemotherapy, last dose was 3 weeks agao. Following up with Dr. Burr Medico. -will add Dr. Burr Medico to treatment team -f/u with Dr. Burr Medico  Iron deficiency anemia due to chronic blood loss: hgb stable. 9.4 on 01/04/19-->9.9 today -f/u by CBC  Hypokalemia: K= 2.4  on admission. - Repleted - Check Mg level - Give 1 g of magnesium sulfate    Inpatient status:  # Patient requires inpatient status due to high intensity of service, high risk for further deterioration and high frequency of surveillance required.  I certify that at the point of admission it is my clinical judgment that the patient will require inpatient hospital care spanning beyond 2 midnights from the point of admission.  . This patient has multiple chronic comorbidities including rectal colon cancer metastasized to liver and lung on chemotherapy, hypertension, iron deficiency anemia, PE on Xarelto . Now patient has presenting with large bowel obstruction . The worrisome physical exam findings include abdominal distention and lower abdominal tenderness . The initial radiographic and laboratory data are worrisome because of colonic obstruction from rectal cancer mass,  hypokalemia. . Current medical needs: please see my assessment and plan . Predictability of an adverse outcome (risk): Patient has multiple comorbidities, particularly rectal colon cancer metastasized to liver and lung, currently on chemotherapy.  Now presents with large bowel obstruction, will need surgery.  Patient is at high risk of deteriorating.  Will need to be treated in hospital for at least 2 days.   DVT ppx: on IV heparin Code Status: Full code ( I discussed with pt about Code status, he wants to be full code) Family Communication: None at bed side.   Disposition Plan:  Anticipate discharge back to previous home environment Consults called:  Dr. Ninfa Linden for general surgeon Admission status:    medical floor/inpat    Date of Service 01/18/2019    [place] [facility]   If 7PM-7AM, please contact night-coverage [email or web address] Password [facility] 01/18/2019, 10:36 PM

## 2019-01-18 NOTE — Progress Notes (Signed)
ANTICOAGULATION CONSULT NOTE - Initial Consult  Pharmacy Consult for IV heparin Indication: pulmonary embolus on PTA xarelto, pending surgery  No Known Allergies  Patient Measurements:   Heparin Dosing Weight: 89 kg  Vital Signs: Temp: 97.9 F (36.6 C) (04/15 1857) BP: 150/101 (04/15 2134) Pulse Rate: 90 (04/15 2150)  Labs: Recent Labs    01/18/19 1928  HGB 9.9*  HCT 31.1*  PLT 180  CREATININE 0.76    CrCl cannot be calculated (Unknown ideal weight.).   Medical History: Past Medical History:  Diagnosis Date  . Cancer [facility])     Medications:  Scheduled:  . polyethylene glycol  17 g Oral BID  . potassium chloride SA  40 mEq Oral Once   Infusions:  . sodium chloride 100 mL/hr at 01/18/19 2200  . magnesium sulfate 1 - 4 g bolus IVPB    . potassium chloride 10 mEq (01/18/19 2133)    Assessment: 69 yoM with abd pain, n/v on chronic xarelto for hx of PE. IV heparin per Rx   Baseline labs: H/H = 9.9/31.1, plts=180 aptt = 39, INR=1.3, HL=1.84 Will use aptt to follow d/t DOAC effect on HL.  Goal of Therapy:  Heparin level 0.3-0.7 units/ml aPTT 66-102 seconds Monitor platelets by anticoagulation protocol: Yes   Plan:  Baseline coags STAT Heparin drip at 1600 units/hr Daily CBC/HL Check 1st aptt in 6 hours  Dorrene German 01/18/2019,10:04 PM

## 2019-01-18 NOTE — Telephone Encounter (Signed)
Spoke with patient regarding no BM, per Dr. Burr Medico instructed him to get Magnesium Citrate drink bottle, increase dose/frequency of Miralax.  Instructed patient to call back if no results and/or increased pain.  He verbalized an understanding.

## 2019-01-18 NOTE — ED Provider Notes (Signed)
[facility] DEPT Provider Note   CSN: [id number] Arrival date & time: 01/18/19  1847    History   Chief Complaint Chief Complaint  Patient presents with  . Constipation  . Abdominal Pain    HPI Billy Mendoza is a 68 y.o. male with PMH/o rectal cancer who presents for evaluation of 1 week of constipation.  Patient states that for the last week or so, he has not been able to have any bowel movement.  He reports occasionally, he will have some mucousy/bloody discharge which he states is been an ongoing occurrence with his tumor for the last several months.  He has not any black or tarry stools.  Patient states that he called his doctor about 4 days ago who told him to do MiraLAX.  Patient reports that he did MiraLAX but did not have any improvement.  He reports that about 3 days ago, he stopped passing gas.  Patient states that today, he tried mag citrate and states that he still did not have a bowel movement after trying mag citrate.  He reports that he has had some decreased appetite since onset of symptoms.  He states that today, he was not able to eat anything.  He did have one episode of nonbloody, nonbilious vomiting after trying to eat.  Patient called his doctor and advised him to go to the emergency department for further evaluation.  Patient is on Xarelto for prior treatment of blood clot.  Patient reports his last chemo treatment was about 3 weeks ago.  Patient denies any fevers, chest pain, difficulty breathing, dysuria, hematuria.     The history is provided by the patient.    Past Medical History:  Diagnosis Date  . Cancer [facility])     Patient Active Problem List   Diagnosis Date Noted  . Hypokalemia 01/18/2019  . Large bowel obstruction ([place])   . Port-A-Cath in place 08/18/2018  . Iron deficiency anemia due to chronic blood loss 08/18/2018  . Rectal cancer metastasized to liver ([place]) 08/04/2018  . Goals of care, counseling/discussion  08/04/2018    Past Surgical History:  Procedure Laterality Date  . IR IMAGING GUIDED PORT INSERTION  08/16/2018  . IR US GUIDE BX ASP/DRAIN  08/16/2018        Home Medications    Prior to Admission medications   Medication Sig Start Date End Date Taking? Authorizing Provider  acetaminophen (TYLENOL) 325 MG tablet Take 650 mg by mouth every 6 (six) hours as needed for moderate pain.   Yes [provider]  amLODipine (NORVASC) 5 MG tablet Take 1 tablet (5 mg total) by mouth 2 (two) times daily. 11/09/18  Yes Truitt Merle, MD  diphenoxylate-atropine (LOMOTIL) 2.5-0.025 MG tablet Take 1-2 tablets by mouth 4 (four) times daily as needed for diarrhea or loose stools. Up to 8 tabs daily for severe diarrhea 09/07/18  Yes Truitt Merle, MD  potassium chloride SA (K-DUR,KLOR-CON) 20 MEQ tablet TAKE 1 TABLET(20 MEQ) BY MOUTH DAILY 12/19/18  Yes Truitt Merle, MD  rivaroxaban (XARELTO) 20 MG TABS tablet Take 1 tablet (20 mg total) by mouth daily with supper. 01/04/19  Yes Truitt Merle, MD  VITAMIN E PO Take 1 tablet by mouth daily.   Yes [provider]  prochlorperazine (COMPAZINE) 10 MG tablet Take 1 tablet (10 mg total) by mouth every 6 (six) hours as needed (Nausea or vomiting). Patient not taking: Reported on 08/30/2018 08/04/18 09/01/18  Truitt Merle, MD    Family History  Family History  Problem Relation Age of Onset  . Cancer Mother 22       breast cancer  . Atrial fibrillation Father   . Heart failure Father   . Cancer Maternal Grandmother        lung cancer     Social History Social History   Tobacco Use  . Smoking status: Former Smoker    Years: 10.00    Types: Cigarettes, Cigars  . Smokeless tobacco: Never Used  . Tobacco comment: quit in Sep 2019   Substance Use Topics  . Alcohol use: Yes    Alcohol/week: 1.0 - 2.0 standard drinks    Types: 1 - 2 Shots of liquor per week  . Drug use: Never     Allergies   Patient has no known allergies.   Review of Systems Review  of Systems  Constitutional: Negative for fever.  Respiratory: Negative for cough and shortness of breath.   Cardiovascular: Negative for chest pain.  Gastrointestinal: Positive for abdominal pain, constipation, nausea and vomiting.  Genitourinary: Negative for dysuria and hematuria.  Neurological: Negative for headaches.  All other systems reviewed and are negative.    Physical Exam Updated Vital Signs BP (!) 150/101   Pulse 90   Temp 97.9 F (36.6 C)   Resp 16   Wt 110.2 kg   SpO2 98%   BMI 32.06 kg/m   Physical Exam Vitals signs and nursing note reviewed. Exam conducted with a chaperone present.  Constitutional:      Appearance: Normal appearance. He is well-developed.  HENT:     Head: Normocephalic and atraumatic.  Eyes:     General: Lids are normal.     Conjunctiva/sclera: Conjunctivae normal.     Pupils: Pupils are equal, round, and reactive to light.  Neck:     Musculoskeletal: Full passive range of motion without pain.  Cardiovascular:     Rate and Rhythm: Normal rate and regular rhythm.     Pulses: Normal pulses.     Heart sounds: Normal heart sounds. No murmur. No friction rub. No gallop.   Pulmonary:     Effort: Pulmonary effort is normal.     Breath sounds: Normal breath sounds.     Comments: Lungs clear to auscultation bilaterally.  Symmetric chest rise.  No wheezing, rales, rhonchi. Abdominal:     General: Bowel sounds are absent. There is distension.     Palpations: Abdomen is soft. Abdomen is not rigid.     Tenderness: There is abdominal tenderness. There is no guarding.     Comments: Slight abdominal distention.  Generalized tenderness.  No focal point tenderness.  Genitourinary:    Rectum: Normal. Guaiac result negative.     Comments: The exam was performed with a chaperone present. Digital Rectal Exam reveals sphincter with good tone. No external hemorrhoids. No masses or fissures. Stool color is brown with no overt blood. Musculoskeletal: Normal  range of motion.  Skin:    General: Skin is warm and dry.     Capillary Refill: Capillary refill takes less than 2 seconds.  Neurological:     Mental Status: He is alert and oriented to person, place, and time.  Psychiatric:        Speech: Speech normal.      ED Treatments / Results  Labs (all labs ordered are listed, but only abnormal results are displayed) Labs Reviewed  COMPREHENSIVE METABOLIC PANEL - Abnormal; Notable for the following components:      Result Value  Potassium 2.4 (*)    CO2 18 (*)    Glucose, Bld 133 (*)    Calcium 7.5 (*)    Albumin 3.1 (*)    Alkaline Phosphatase 135 (*)    All other components within normal limits  CBC WITH DIFFERENTIAL/PLATELET - Abnormal; Notable for the following components:   RBC 2.74 (*)    Hemoglobin 9.9 (*)    HCT 31.1 (*)    MCV 113.5 (*)    MCH 36.1 (*)    RDW 18.6 (*)    All other components within normal limits  LIPASE, BLOOD  MAGNESIUM  PROTIME-INR  APTT  HIV ANTIBODY (ROUTINE TESTING W REFLEX)  BASIC METABOLIC PANEL  CBC  HEPARIN LEVEL (UNFRACTIONATED)  POC OCCULT BLOOD, ED  TYPE AND SCREEN    EKG None  Radiology Ct Abdomen Pelvis W Contrast  Result Date: 01/18/2019 CLINICAL DATA:  No bowel movement for 1 week, abdominal pain EXAM: CT ABDOMEN AND PELVIS WITH CONTRAST TECHNIQUE: Multidetector CT imaging of the abdomen and pelvis was performed using the standard protocol following bolus administration of intravenous contrast. CONTRAST:  133m OMNIPAQUE IOHEXOL 300 MG/ML  SOLN COMPARISON:  11/04/2018 FINDINGS: Lower chest: Right middle lobe pulmonary nodules with the largest measuring 10 mm enlarged compared with the prior examination. 5 mm right lower lobe pulmonary nodule. Hepatobiliary: Multiple hypodense hepatic masses with the largest in the right hepatic lobe near the dome of the liver measuring 4.3 x 2.8 cm decreased in size compared with 11/04/2018. No gallstones, gallbladder wall thickening, or biliary  dilatation. Pancreas: Unremarkable. No pancreatic ductal dilatation or surrounding inflammatory changes. Spleen: Normal in size without focal abnormality. Adrenals/Urinary Tract: Adrenal glands are unremarkable. Bilateral hypodense, fluid attenuating renal masses consistent with cysts. No urolithiasis or obstructive uropathy. Bladder is unremarkable. Stomach/Bowel: Stomach is normal. Severe colonic distension to the level of the rectum. Colon is fluid-filled. Focal area of rectal wall thickening most consistent with rectal carcinoma with perirectal tumor along the left lateral aspect measuring 1.5 x 1.2 cm. Stable presacral edema. No pneumatosis, pneumoperitoneum or portal venous gas. Vascular/Lymphatic: Abdominal aortic atherosclerosis. Normal caliber abdominal aorta. No lymphadenopathy. Reproductive: Prostate is unremarkable. Other: No fluid collection or hematoma. Fat containing right inguinal hernia. Musculoskeletal: No acute osseous abnormality. No aggressive osseous lesion. IMPRESSION: 1. Colonic obstruction resulting from rectal carcinoma. 2. Hepatic metastatic disease which is decreased in size compared with the prior examination of 11/04/2018. 3. Multiple right middle lobe and right lower lobe pulmonary nodules most consistent with metastatic disease. Electronically Signed   By: HKathreen Devoid  On: 01/18/2019 21:11    Procedures Procedures (including critical care time)  Medications Ordered in ED Medications  potassium chloride 10 mEq in 100 mL IVPB (10 mEq Intravenous New Bag/Given 01/18/19 2133)  magnesium sulfate IVPB 1 g 100 mL (has no administration in time range)  0.9 %  sodium chloride infusion ( Intravenous New Bag/Given 01/18/19 2200)  hydrALAZINE (APRESOLINE) injection 5 mg (has no administration in time range)  acetaminophen (TYLENOL) tablet 650 mg (has no administration in time range)    Or  acetaminophen (TYLENOL) suppository 650 mg (has no administration in time range)  ondansetron  (ZOFRAN) tablet 4 mg (has no administration in time range)    Or  ondansetron (ZOFRAN) injection 4 mg (has no administration in time range)  polyethylene glycol (MIRALAX / GLYCOLAX) packet 17 g (17 g Oral Given 01/18/19 2213)  ondansetron (ZOFRAN) injection 4 mg (4 mg Intravenous Given 01/18/19 1929)  sodium  chloride 0.9 % bolus 1,000 mL (0 mLs Intravenous Stopped 01/18/19 2028)  potassium chloride SA (K-DUR,KLOR-CON) CR tablet 40 mEq (40 mEq Oral Given 01/18/19 2024)  iohexol (OMNIPAQUE) 300 MG/ML solution 100 mL (100 mLs Intravenous Contrast Given 01/18/19 2036)  sodium chloride (PF) 0.9 % injection (  Given 01/18/19 2032)  potassium chloride SA (K-DUR,KLOR-CON) CR tablet 40 mEq (40 mEq Oral Given 01/18/19 2212)     Initial Impression / Assessment and Plan / ED Course  I have reviewed the triage vital signs and the nursing notes.  Pertinent labs & imaging results that were available during my care of the patient were reviewed by me and considered in my medical decision making (see chart for details).        68 year old male with past medical history of rectal cancer (currently on chemo) with mets to liver who presents for evaluation of constipation, abdominal pain that is been ongoing for last week.  Started having some vomiting today.  No fevers, chest pain, difficulty breathing.  On exam, abdomen is slightly distended with generalized tenderness. Patient is afebrile, non-toxic appearing, sitting comfortably on examination table. Vital signs reviewed and stable.  Concern for small bowel obstruction versus infectious process.  Will plan for labs, CT abd/pelvis.  Fecal Occult is negative. Lipase unremarkable. CMP shows K+ of 2.4. Plan to repleat.  Bicarb of 18.  Alk phos is slightly elevated at 135. CBC shows no leukocytosis.  Hemoglobin is 9.9.  Review of records show that is consistent with his baseline.  CT on pelvis shows severe colonic distention to the level of the rectum as well as colonic  obstruction.  There is mention of nodules noted in the liver as well as the lungs consistent with metastatic disease.  Discussed results with patient. He still is not having any pain and has not had any vomiting since being here in the ED.   Discussed patient with Dr. Ninfa Linden (Gen Surg). Recommends medical admission with plans for surgical consult tomorrow. No indication for NG tube at this time given that patient is not having any vomiting. If patient develops some vomiting, potentially may need NG tube.   Discussed patient with Dr. Donna Bernard (hospitalist). Will accept patient for admission.   Portions of this note were generated with [employer]. Dictation errors may occur despite best attempts at proofreading.  Final Clinical Impressions(s) / ED Diagnoses   Final diagnoses:  Large bowel obstruction [facility])  Hypokalemia    ED Discharge Orders    None       Desma Mcgregor 01/18/19 2224    Daleen Bo, MD 01/18/19 2314

## 2019-01-18 NOTE — ED Notes (Signed)
ED TO INPATIENT HANDOFF REPORT  ED Nurse Name and Phone #: Cloyd Stagers RN [phone]  S Name/Age/Gender Billy Mendoza 68 y.o. male Room/Bed: WA13/WA13  Code Status   Code Status: Full Code  Home/SNF/Other Home Patient oriented to: self, place, time and situation Is this baseline? Yes   Triage Complete: Triage complete  Chief Complaint CA PT; Abdominal Pain; Nausea  Triage Note Pt reports no BM for 5 days, lower abd pains, nausea and  Vomiting x2, some distention, low energy, no appetite. 4/11-first dose of Miralax, 4/12-2nd dose of Miralac, 4/13-none taken, stopped passing gas. 4/14-3rd dose of Miralax. 4/15-0930 mag Citrate, 1600 Miralax, 1700 vomit, 1800 vomit. Pt has rectal cancer, last chemo treatment 3 weeks ago.    Allergies No Known Allergies  Level of Care/Admitting Diagnosis ED Disposition    ED Disposition Condition Comment   Admit  Hospital Area: [place] [[id number]]  Level of Care: Med-Surg [16]  Diagnosis: Bowel obstruction [facility]) [[id number]]  Admitting Physician: Ivor Costa [[id number]]  Attending Physician: Ivor Costa [[id number]]  Estimated length of stay: past midnight tomorrow  Certification:: I certify this patient will need inpatient services for at least 2 midnights  Possible Covid Disease Patient Isolation: N/A  PT Class (Do Not Modify): Inpatient [101]  PT Acc Code (Do Not Modify): Private [1]       B Medical/Surgery History Past Medical History:  Diagnosis Date  . Cancer [facility])    Past Surgical History:  Procedure Laterality Date  . IR IMAGING GUIDED PORT INSERTION  08/16/2018  . IR US GUIDE BX ASP/DRAIN  08/16/2018     A IV Location/Drains/Wounds Patient Lines/Drains/Airways Status   Active Line/Drains/Airways    Name:   Placement date:   Placement time:   Site:   Days:   Implanted Port 08/16/18 Right Chest   08/16/18    1328    Chest   155          Intake/Output Last 24 hours  Intake/Output Summary (Last 24 hours) at  01/18/2019 2247 Last data filed at 01/18/2019 2233 Gross per 24 hour  Intake 1200 ml  Output -  Net 1200 ml    Labs/Imaging Results for orders placed or performed during the hospital encounter of 01/18/19 (from the past 48 hour(s))  POC occult blood, ED Provider will collect     Status: None   Collection Time: 01/18/19  7:13 PM  Result Value Ref Range   Fecal Occult Bld NEGATIVE NEGATIVE  Comprehensive metabolic panel     Status: Abnormal   Collection Time: 01/18/19  7:28 PM  Result Value Ref Range   Sodium 139 135 - 145 mmol/L   Potassium 2.4 (LL) 3.5 - 5.1 mmol/L    Comment: CRITICAL RESULT CALLED TO, READ BACK BY AND VERIFIED WITH: RN [facility] IBOWU AT 2016 01/18/19 CRUICKSHANK A    Chloride 111 98 - 111 mmol/L   CO2 18 (L) 22 - 32 mmol/L   Glucose, Bld 133 (H) 70 - 99 mg/dL   BUN 18 8 - 23 mg/dL   Creatinine, Ser 0.76 0.61 - 1.24 mg/dL   Calcium 7.5 (L) 8.9 - 10.3 mg/dL   Total Protein 6.7 6.5 - 8.1 g/dL   Albumin 3.1 (L) 3.5 - 5.0 g/dL   AST 25 15 - 41 U/L   ALT 28 0 - 44 U/L   Alkaline Phosphatase 135 (H) 38 - 126 U/L   Total Bilirubin 0.8 0.3 - 1.2 mg/dL   GFR calc non  Af Amer >60 >60 mL/min   GFR calc Af Amer >60 >60 mL/min   Anion gap 10 5 - 15    Comment: Performed at [facility], [place] [address], [place], [place] [address]  CBC with Differential     Status: Abnormal   Collection Time: 01/18/19  7:28 PM  Result Value Ref Range   WBC 6.7 4.0 - 10.5 K/uL   RBC 2.74 (L) 4.22 - 5.81 MIL/uL   Hemoglobin 9.9 (L) 13.0 - 17.0 g/dL   HCT 31.1 (L) 39.0 - 52.0 %   MCV 113.5 (H) 80.0 - 100.0 fL   MCH 36.1 (H) 26.0 - 34.0 pg   MCHC 31.8 30.0 - 36.0 g/dL   RDW 18.6 (H) 11.5 - 15.5 %   Platelets 180 150 - 400 K/uL   nRBC 0.0 0.0 - 0.2 %   Neutrophils Relative % 79 %   Neutro Abs 5.3 1.7 - 7.7 K/uL   Lymphocytes Relative 13 %   Lymphs Abs 0.8 0.7 - 4.0 K/uL   Monocytes Relative 8 %   Monocytes Absolute 0.6 0.1 - 1.0 K/uL   Eosinophils Relative 0 %    Eosinophils Absolute 0.0 0.0 - 0.5 K/uL   Basophils Relative 0 %   Basophils Absolute 0.0 0.0 - 0.1 K/uL   Immature Granulocytes 0 %   Abs Immature Granulocytes 0.02 0.00 - 0.07 K/uL   Polychromasia PRESENT     Comment: Performed at [facility], [place] [address], [place], [place] [address]  Lipase, blood     Status: None   Collection Time: 01/18/19  7:28 PM  Result Value Ref Range   Lipase 27 11 - 51 U/L    Comment: Performed at [facility], [place] [address], [place], [place] [address]  Protime-INR     Status: Abnormal   Collection Time: 01/18/19  9:57 PM  Result Value Ref Range   Prothrombin Time 16.3 (H) 11.4 - 15.2 seconds   INR 1.3 (H) 0.8 - 1.2    Comment: (NOTE) INR goal varies based on device and disease states. Performed at [facility], [place] [address], [place], [place] [address]   APTT     Status: Abnormal   Collection Time: 01/18/19  9:57 PM  Result Value Ref Range   aPTT 39 (H) 24 - 36 seconds    Comment:        IF BASELINE aPTT IS ELEVATED, SUGGEST PATIENT RISK ASSESSMENT BE USED TO DETERMINE APPROPRIATE ANTICOAGULANT THERAPY. Performed at [facility], [place] [address], [place], [place] [address]    Ct Abdomen Pelvis W Contrast  Result Date: 01/18/2019 CLINICAL DATA:  No bowel movement for 1 week, abdominal pain EXAM: CT ABDOMEN AND PELVIS WITH CONTRAST TECHNIQUE: Multidetector CT imaging of the abdomen and pelvis was performed using the standard protocol following bolus administration of intravenous contrast. CONTRAST:  110mL OMNIPAQUE IOHEXOL 300 MG/ML  SOLN COMPARISON:  11/04/2018 FINDINGS: Lower chest: Right middle lobe pulmonary nodules with the largest measuring 10 mm enlarged compared with the prior examination. 5 mm right lower lobe pulmonary nodule. Hepatobiliary: Multiple hypodense hepatic masses with the largest in the right hepatic lobe near the dome of the liver measuring 4.3 x 2.8 cm  decreased in size compared with 11/04/2018. No gallstones, gallbladder wall thickening, or biliary dilatation. Pancreas: Unremarkable. No pancreatic ductal dilatation or surrounding inflammatory changes. Spleen: Normal in size without focal abnormality. Adrenals/Urinary Tract: Adrenal glands are unremarkable. Bilateral  hypodense, fluid attenuating renal masses consistent with cysts. No urolithiasis or obstructive uropathy. Bladder is unremarkable. Stomach/Bowel: Stomach is normal. Severe colonic distension to the level of the rectum. Colon is fluid-filled. Focal area of rectal wall thickening most consistent with rectal carcinoma with perirectal tumor along the left lateral aspect measuring 1.5 x 1.2 cm. Stable presacral edema. No pneumatosis, pneumoperitoneum or portal venous gas. Vascular/Lymphatic: Abdominal aortic atherosclerosis. Normal caliber abdominal aorta. No lymphadenopathy. Reproductive: Prostate is unremarkable. Other: No fluid collection or hematoma. Fat containing right inguinal hernia. Musculoskeletal: No acute osseous abnormality. No aggressive osseous lesion. IMPRESSION: 1. Colonic obstruction resulting from rectal carcinoma. 2. Hepatic metastatic disease which is decreased in size compared with the prior examination of 11/04/2018. 3. Multiple right middle lobe and right lower lobe pulmonary nodules most consistent with metastatic disease. Electronically Signed   By: Kathreen Devoid   On: 01/18/2019 21:11    Pending Labs Unresulted Labs (From admission, onward)    Start     Ordered   01/19/19 0500  Magnesium  Tomorrow morning,   R     01/18/19 2155   01/19/19 6203  Basic metabolic panel  Tomorrow morning,   R     01/18/19 2159   01/19/19 0500  CBC  Tomorrow morning,   R     01/18/19 2159   01/18/19 2203  Heparin level (unfractionated)  ONCE - STAT,   R     01/18/19 2202   01/18/19 2158  Type and screen [place]  Once,   R    Comments:  [place]    01/18/19 2157   01/18/19 2158  HIV antibody (Routine Testing)  Once,   R     01/18/19 2159          Vitals/Pain Today's Vitals   01/18/19 2200 01/18/19 2224 01/18/19 2230 01/18/19 2243  BP:  (!) 153/101    Pulse:  90 89 90  Resp:  (!) 9 13 18   Temp:      SpO2:  97% 97% 98%  Weight: 110.2 kg     PainSc:        Isolation Precautions No active isolations  Medications Medications  potassium chloride 10 mEq in 100 mL IVPB (10 mEq Intravenous New Bag/Given 01/18/19 2234)  magnesium sulfate IVPB 1 g 100 mL (1 g Intravenous New Bag/Given 01/18/19 2231)  0.9 %  sodium chloride infusion ( Intravenous Rate/Dose Change 01/18/19 2226)  hydrALAZINE (APRESOLINE) injection 5 mg (has no administration in time range)  acetaminophen (TYLENOL) tablet 650 mg (has no administration in time range)    Or  acetaminophen (TYLENOL) suppository 650 mg (has no administration in time range)  ondansetron (ZOFRAN) tablet 4 mg (has no administration in time range)    Or  ondansetron (ZOFRAN) injection 4 mg (has no administration in time range)  polyethylene glycol (MIRALAX / GLYCOLAX) packet 17 g (17 g Oral Given 01/18/19 2213)  morphine 2 MG/ML injection 2 mg (has no administration in time range)  ondansetron (ZOFRAN) injection 4 mg (4 mg Intravenous Given 01/18/19 1929)  sodium chloride 0.9 % bolus 1,000 mL (0 mLs Intravenous Stopped 01/18/19 2028)  potassium chloride SA (K-DUR,KLOR-CON) CR tablet 40 mEq (40 mEq Oral Given 01/18/19 2024)  iohexol (OMNIPAQUE) 300 MG/ML solution 100 mL (100 mLs Intravenous Contrast Given 01/18/19 2036)  sodium chloride (PF) 0.9 % injection (  Given 01/18/19 2032)  potassium chloride SA (K-DUR,KLOR-CON) CR tablet 40 mEq (40 mEq Oral Given  01/18/19 2212)    Mobility walks Low fall risk   Focused Assessments Neuro Assessment Handoff:  Swallow screen pass? Yes  Cardiac Rhythm: Normal sinus rhythm       Neuro Assessment:   Neuro Checks:      Last Documented  NIHSS Modified Score:   Has TPA been given? No If patient is a Neuro Trauma and patient is going to OR before floor call report to [place] nurse: [phone] or [phone]     R Recommendations: See Admitting Provider Note  Report given to:   Additional Notes: Cancer pt, surgery consult.

## 2019-01-18 NOTE — Telephone Encounter (Signed)
Patient calls with concern over not having a bowel movement since last Friday.  He started using Miralax 2 to 3 times on Saturday and Sunday, used Miralax one time M/T/W still no results.  Denies pain was passing gas but feels like as of Monday has not.  Does not have the urge to go.  Denies vomiting.  Informed him would discuss with Dr. Burr Medico and call him back.    Message was placed on Dr. Ernestina Penna desk for review.

## 2019-01-18 NOTE — ED Notes (Signed)
Date and time results received: 01/18/19    Test: potassium Critical Value: 2.4  Name of Provider Notified: Eulis Foster  Orders Received? Or Actions Taken?: Actions Taken: orderes

## 2019-01-18 NOTE — ED Triage Notes (Signed)
Pt reports no BM for 5 days, lower abd pains, nausea and  Vomiting x2, some distention, low energy, no appetite. 4/11-first dose of Miralax, 4/12-2nd dose of Miralac, 4/13-none taken, stopped passing gas. 4/14-3rd dose of Miralax. 4/15-0930 mag Citrate, 1600 Miralax, 1700 vomit, 1800 vomit. Pt has rectal cancer, last chemo treatment 3 weeks ago.

## 2019-01-18 NOTE — ED Provider Notes (Signed)
  Face-to-face evaluation   History: He complains of ongoing decreased stooling for several days, with avoiding eating, because he did not want to make it worse.  Day he vomited twice after eating a small amount of [employer] umbilical.  Is currently being treated for Colon Cancer, which is metastatic.  He denies abdominal pain, currently.   Physical exam: Alert, calm, cooperative.  Abdomen is somewhat distended.  The abdomen is soft.  Medical screening examination/treatment/procedure(s) were conducted as a shared visit with non-physician practitioner(s) and myself.  I personally evaluated the patient during the encounter    Daleen Bo, MD 01/19/19 1715

## 2019-01-19 ENCOUNTER — Inpatient Hospital Stay: Payer: BLUE CROSS/BLUE SHIELD

## 2019-01-19 ENCOUNTER — Inpatient Hospital Stay (HOSPITAL_COMMUNITY): Payer: BLUE CROSS/BLUE SHIELD

## 2019-01-19 ENCOUNTER — Encounter (HOSPITAL_COMMUNITY)
Admission: EM | Disposition: A | Discharge status: Home / Self Care | Payer: Self-pay | Source: Home / Self Care | Attending: Internal Medicine

## 2019-01-19 ENCOUNTER — Inpatient Hospital Stay: Payer: BLUE CROSS/BLUE SHIELD | Admitting: Hematology

## 2019-01-19 ENCOUNTER — Inpatient Hospital Stay (HOSPITAL_COMMUNITY): Payer: BLUE CROSS/BLUE SHIELD | Admitting: Anesthesiology

## 2019-01-19 ENCOUNTER — Encounter (HOSPITAL_COMMUNITY): Payer: Self-pay | Admitting: Certified Registered"

## 2019-01-19 DIAGNOSIS — Z7901 Long term (current) use of anticoagulants: Secondary | ICD-10-CM

## 2019-01-19 DIAGNOSIS — D6481 Anemia due to antineoplastic chemotherapy: Secondary | ICD-10-CM

## 2019-01-19 DIAGNOSIS — T451X5A Adverse effect of antineoplastic and immunosuppressive drugs, initial encounter: Secondary | ICD-10-CM

## 2019-01-19 DIAGNOSIS — I2699 Other pulmonary embolism without acute cor pulmonale: Secondary | ICD-10-CM

## 2019-01-19 DIAGNOSIS — Z86711 Personal history of pulmonary embolism: Secondary | ICD-10-CM

## 2019-01-19 DIAGNOSIS — D500 Iron deficiency anemia secondary to blood loss (chronic): Secondary | ICD-10-CM

## 2019-01-19 DIAGNOSIS — C7800 Secondary malignant neoplasm of unspecified lung: Secondary | ICD-10-CM

## 2019-01-19 DIAGNOSIS — D509 Iron deficiency anemia, unspecified: Secondary | ICD-10-CM

## 2019-01-19 HISTORY — PX: FLEXIBLE SIGMOIDOSCOPY: SHX5431

## 2019-01-19 HISTORY — PX: COLONIC STENT PLACEMENT: SHX5542

## 2019-01-19 LAB — CBC
HCT: 29.9 % — ABNORMAL LOW (ref 39.0–52.0)
Hemoglobin: 9.6 g/dL — ABNORMAL LOW (ref 13.0–17.0)
MCH: 36.4 pg — ABNORMAL HIGH (ref 26.0–34.0)
MCHC: 32.1 g/dL (ref 30.0–36.0)
MCV: 113.3 fL — ABNORMAL HIGH (ref 80.0–100.0)
Platelets: 177 10*3/uL (ref 150–400)
RBC: 2.64 MIL/uL — ABNORMAL LOW (ref 4.22–5.81)
RDW: 18.3 % — ABNORMAL HIGH (ref 11.5–15.5)
WBC: 6.2 10*3/uL (ref 4.0–10.5)
nRBC: 0 % (ref 0.0–0.2)

## 2019-01-19 LAB — BASIC METABOLIC PANEL
Anion gap: 8 (ref 5–15)
BUN: 17 mg/dL (ref 8–23)
CO2: 20 mmol/L — ABNORMAL LOW (ref 22–32)
Calcium: 8.1 mg/dL — ABNORMAL LOW (ref 8.9–10.3)
Chloride: 110 mmol/L (ref 98–111)
Creatinine, Ser: 0.71 mg/dL (ref 0.61–1.24)
GFR calc Af Amer: >60 mL/min (ref >60)
GFR calc non Af Amer: >60 mL/min (ref >60)
Glucose, Bld: 125 mg/dL — ABNORMAL HIGH (ref 70–99)
Potassium: 3.1 mmol/L — ABNORMAL LOW (ref 3.5–5.1)
Sodium: 138 mmol/L (ref 135–145)

## 2019-01-19 LAB — APTT: aPTT: 147 seconds — ABNORMAL HIGH (ref 24–36)

## 2019-01-19 LAB — GLUCOSE, CAPILLARY: Glucose-Capillary: 122 mg/dL — ABNORMAL HIGH (ref 70–99)

## 2019-01-19 LAB — HIV ANTIBODY (ROUTINE TESTING W REFLEX): HIV Screen 4th Generation wRfx: NONREACTIVE

## 2019-01-19 LAB — MAGNESIUM: Magnesium: 2.3 mg/dL (ref 1.7–2.4)

## 2019-01-19 LAB — HEPARIN LEVEL (UNFRACTIONATED): Heparin Unfractionated: 1.72 IU/mL — ABNORMAL HIGH (ref 0.30–0.70)

## 2019-01-19 LAB — ABO/RH: ABO/RH(D): O POS

## 2019-01-19 SURGERY — SIGMOIDOSCOPY, FLEXIBLE
Anesthesia: Monitor Anesthesia Care

## 2019-01-19 MED ORDER — PROPOFOL 10 MG/ML IV BOLUS
INTRAVENOUS | Status: DC | PRN
  Administered 2019-01-19 (×16): 40 mg via INTRAVENOUS

## 2019-01-19 MED ORDER — PROMETHAZINE HCL 25 MG/ML IJ SOLN: 12.5000 mg | Freq: Four times a day (QID) | INTRAMUSCULAR | Status: DC | PRN

## 2019-01-19 MED ORDER — HEPARIN (PORCINE) 25000 UT/250ML-% IV SOLN
1200.0000 [IU]/h | INTRAVENOUS | Status: DC
  Administered 2019-01-19 (×3): 1200 [IU]/h via INTRAVENOUS
  Filled 2019-01-19: qty 250

## 2019-01-19 MED ORDER — PROPOFOL 10 MG/ML IV BOLUS
INTRAVENOUS | Status: AC
  Filled 2019-01-19: qty 20

## 2019-01-19 MED ORDER — PROPOFOL 10 MG/ML IV BOLUS
INTRAVENOUS | Status: AC
  Filled 2019-01-19: qty 40

## 2019-01-19 MED ORDER — LIDOCAINE 2% (20 MG/ML) 5 ML SYRINGE
INTRAMUSCULAR | Status: DC | PRN
  Administered 2019-01-19: 40 mg via INTRAVENOUS

## 2019-01-19 MED ORDER — ONDANSETRON HCL 4 MG/2ML IJ SOLN
INTRAMUSCULAR | Status: DC | PRN
  Administered 2019-01-19: 4 mg via INTRAVENOUS

## 2019-01-19 MED ORDER — POTASSIUM CHLORIDE 10 MEQ/100ML IV SOLN
10.0000 meq | INTRAVENOUS | Status: AC
  Administered 2019-01-19 (×4): 10 meq via INTRAVENOUS
  Filled 2019-01-19 (×4): qty 100

## 2019-01-19 MED ORDER — LACTATED RINGERS IV SOLN
INTRAVENOUS | Status: DC | PRN
  Administered 2019-01-19: 13:00:00 via INTRAVENOUS

## 2019-01-19 MED ORDER — SODIUM CHLORIDE 0.9 % IV SOLN
INTRAVENOUS | Status: DC
  Administered 2019-01-19: 13:00:00 via INTRAVENOUS

## 2019-01-19 MED ORDER — LACTATED RINGERS IV SOLN
INTRAVENOUS | Status: DC
  Administered 2019-01-19: 12:00:00 1000 mL via INTRAVENOUS

## 2019-01-19 MED ORDER — SODIUM CHLORIDE 0.9% FLUSH
10.0000 mL | INTRAVENOUS | Status: DC | PRN
  Administered 2019-01-19 – 2019-01-20 (×2): 10 mL
  Filled 2019-01-19 (×2): qty 40

## 2019-01-19 NOTE — Progress Notes (Signed)
Billy Mendoza   DOB:1950-10-06   JS#:[id number]   YIF#:[id number]  Oncology follow up   Subjective: Patient is well-known to me, under my care for his metastatic rectal cancer.  He has been on first-line chemotherapy, last dose 4 weeks ago.  Chemo was held 2 weeks ago due to significant thrombocytopenia.  He was admitted last night for bowel obstruction due to rectal tumor. He underwent stent placement by Dr. Benson Norway this afternoon. He has had 2 bowel movement after the procedure, feels better.   Objective:  Vitals:   01/19/19 1818 01/19/19 2010  BP: (!) 146/92 (!) 142/94  Pulse: 85 80  Resp: 15 20  Temp: 98.4 F (36.9 C) 98.4 F (36.9 C)  SpO2: 97% 99%    Body mass index is 32.05 kg/m.  Intake/Output Summary (Last 24 hours) at 01/19/2019 2228 Last data filed at 01/19/2019 2200 Gross per 24 hour  Intake 4626.05 ml  Output 850 ml  Net 3776.05 ml     Sclerae unicteric  Oropharynx clear  No peripheral adenopathy  Lungs clear -- no rales or rhonchi  Heart regular rate and rhythm  Abdomen distended, mild diffuse tenderness   CBG (last 3)  Recent Labs    01/19/19 0732  GLUCAP 122*     Labs:  Urine Studies No results for input(s): UHGB, CRYS in the last 72 hours.  Invalid input(s): UACOL, UAPR, USPG, UPH, UTP, UGL, UKET, UBIL, UNIT, UROB, ULEU, UEPI, UWBC, URBC, UBAC, CAST, UCOM, BILUA  Basic Metabolic Panel: Recent Labs  Lab 01/18/19 1928 01/19/19 0341  NA 139 138  K 2.4* 3.1*  CL 111 110  CO2 18* 20*  GLUCOSE 133* 125*  BUN 18 17  CREATININE 0.76 0.71  CALCIUM 7.5* 8.1*  MG  --  2.3   GFR Estimated Creatinine Clearance: 119.8 mL/min (by C-G formula based on SCr of 0.71 mg/dL). Liver Function Tests: Recent Labs  Lab 01/18/19 1928  AST 25  ALT 28  ALKPHOS 135*  BILITOT 0.8  PROT 6.7  ALBUMIN 3.1*   Recent Labs  Lab 01/18/19 1928  LIPASE 27   No results for input(s): AMMONIA in the last 168 hours. Coagulation profile Recent Labs  Lab  01/18/19 2157  INR 1.3*    CBC: Recent Labs  Lab 01/18/19 1928 01/19/19 0341  WBC 6.7 6.2  NEUTROABS 5.3  --   HGB 9.9* 9.6*  HCT 31.1* 29.9*  MCV 113.5* 113.3*  PLT 180 177   Cardiac Enzymes: No results for input(s): CKTOTAL, CKMB, CKMBINDEX, TROPONINI in the last 168 hours. BNP: Invalid input(s): POCBNP CBG: Recent Labs  Lab 01/19/19 0732  GLUCAP 122*   D-Dimer No results for input(s): DDIMER in the last 72 hours. Hgb A1c No results for input(s): HGBA1C in the last 72 hours. Lipid Profile No results for input(s): CHOL, HDL, LDLCALC, TRIG, CHOLHDL, LDLDIRECT in the last 72 hours. Thyroid function studies No results for input(s): TSH, T4TOTAL, T3FREE, THYROIDAB in the last 72 hours.  Invalid input(s): FREET3 Anemia work up No results for input(s): VITAMINB12, FOLATE, FERRITIN, TIBC, IRON, RETICCTPCT in the last 72 hours. Microbiology No results found for this or any previous visit (from the past 240 hour(s)).    Studies:  Dg Abd 1 View - Kub  Result Date: 01/19/2019 CLINICAL DATA:  Colonic obstruction EXAM: ABDOMEN - 1 VIEW COMPARISON:  01/18/2019 FINDINGS: Imaging confirms placement of a colonoscope across the obstruction in the rectum. Subsequently, a stent has been deployed across the area of narrowing.  IMPRESSION: Successful metallic stent placement across an area of rectal narrowing. Electronically Signed   By: Marybelle Killings M.D.   On: 01/19/2019 14:14   Ct Abdomen Pelvis W Contrast  Result Date: 01/18/2019 CLINICAL DATA:  No bowel movement for 1 week, abdominal pain EXAM: CT ABDOMEN AND PELVIS WITH CONTRAST TECHNIQUE: Multidetector CT imaging of the abdomen and pelvis was performed using the standard protocol following bolus administration of intravenous contrast. CONTRAST:  140m OMNIPAQUE IOHEXOL 300 MG/ML  SOLN COMPARISON:  11/04/2018 FINDINGS: Lower chest: Right middle lobe pulmonary nodules with the largest measuring 10 mm enlarged compared with the prior  examination. 5 mm right lower lobe pulmonary nodule. Hepatobiliary: Multiple hypodense hepatic masses with the largest in the right hepatic lobe near the dome of the liver measuring 4.3 x 2.8 cm decreased in size compared with 11/04/2018. No gallstones, gallbladder wall thickening, or biliary dilatation. Pancreas: Unremarkable. No pancreatic ductal dilatation or surrounding inflammatory changes. Spleen: Normal in size without focal abnormality. Adrenals/Urinary Tract: Adrenal glands are unremarkable. Bilateral hypodense, fluid attenuating renal masses consistent with cysts. No urolithiasis or obstructive uropathy. Bladder is unremarkable. Stomach/Bowel: Stomach is normal. Severe colonic distension to the level of the rectum. Colon is fluid-filled. Focal area of rectal wall thickening most consistent with rectal carcinoma with perirectal tumor along the left lateral aspect measuring 1.5 x 1.2 cm. Stable presacral edema. No pneumatosis, pneumoperitoneum or portal venous gas. Vascular/Lymphatic: Abdominal aortic atherosclerosis. Normal caliber abdominal aorta. No lymphadenopathy. Reproductive: Prostate is unremarkable. Other: No fluid collection or hematoma. Fat containing right inguinal hernia. Musculoskeletal: No acute osseous abnormality. No aggressive osseous lesion. IMPRESSION: 1. Colonic obstruction resulting from rectal carcinoma. 2. Hepatic metastatic disease which is decreased in size compared with the prior examination of 11/04/2018. 3. Multiple right middle lobe and right lower lobe pulmonary nodules most consistent with metastatic disease. Electronically Signed   By: HKathreen Devoid  On: 01/18/2019 21:11   Dg C-arm 1-60 Min-no Report  Result Date: 01/19/2019 Fluoroscopy was utilized by the requesting physician.  No radiographic interpretation.    Assessment: 68y.o. with metastatic rectal cancer, on chemotherapy, admitted for bowel obstruction secondary his primary rectal tumor.  1. Colonic bowel  obstruction secondary to rectal tumor obstruction, s/p stent placement today by Dr. HBenson Norway 2.  Rectal cancer with metastasis to liver and lung, BRAF mutation (+) 3.  Anemia secondary to chemotherapy and iron deficiency 4. History of PE, on xarelto  5. HTN    Plan:  -Pt opted stent placement, over diverting colostomy. I [facility]  -we discuss he has high risk of bowel obstruction recurrence due to his rectal cancer progression, which is inevitable eventually. Due to hsi BRAF mutation in tumor, his cancer is more aggressive. Pt strongly prefers delaying diverting colostomy if possible, due to the impact of colostomy care on his lifestyle.  -I recommend low fiber diet  -I will discuss with Dr. MLisbeth Renshawto see if he is a candidate for a short course radiation to his rectal tumor  -I reviewed his CT scan findings, although his liver mets are stable, due to his progression of rectal tumor, I may consider changing his treatment to second line therapy such as triple TKIs. I will discuss with pt and his wife further next week when he recovers.    YTruitt Merle MD 01/19/2019  10:28 PM

## 2019-01-19 NOTE — Progress Notes (Signed)
Patient ID: Billy Mendoza, male   DOB: March 06, 1951, 67 y.o.   MRN: [id number] Stent placed successfully with resolution of obstruction, doing better Discussed possibility in future still of colostomy if obstructs with stent Will sign off

## 2019-01-19 NOTE — Anesthesia Procedure Notes (Signed)
Procedure Name: MAC Date/Time: 01/19/2019 12:57 PM Performed by: Cynda Familia, CRNA Pre-anesthesia Checklist: Patient identified, Emergency Drugs available, Suction available, Patient being monitored and Timeout performed Patient Re-evaluated:Patient Re-evaluated prior to induction Placement Confirmation: positive ETCO2 and breath sounds checked- equal and bilateral Dental Injury: Teeth and Oropharynx as per pre-operative assessment  Comments: Mac-- with O2 delivered via mask-- anes machine circuit

## 2019-01-19 NOTE — Anesthesia Preprocedure Evaluation (Addendum)
Anesthesia Evaluation  Patient identified by MRN, date of birth, ID band Patient awake    Reviewed: Allergy & Precautions, NPO status , Patient's Chart, lab work & pertinent test results  History of Anesthesia Complications Negative for: history of anesthetic complications  Airway Mallampati: II  TM Distance: >3 FB Neck ROM: Full    Dental no notable dental hx. (+) Dental Advisory Given   Pulmonary former smoker,    Pulmonary exam normal        Cardiovascular hypertension, Pt. on medications Normal cardiovascular exam     Neuro/Psych negative neurological ROS     GI/Hepatic Neg liver ROS, Rectal Ca: liver/Lung mets   Endo/Other  negative endocrine ROS  Renal/GU negative Renal ROS     Musculoskeletal negative musculoskeletal ROS (+)   Abdominal   Peds  Hematology negative hematology ROS (+) anemia ,   Anesthesia Other Findings Day of surgery medications reviewed with the patient.  Reproductive/Obstetrics                            Anesthesia Physical Anesthesia Plan  ASA: III  Anesthesia Plan: MAC   Post-op Pain Management:    Induction: Intravenous  PONV Risk Score and Plan: 2 and Ondansetron and Propofol infusion  Airway Management Planned: Simple Face Mask and Natural Airway  Additional Equipment:   Intra-op Plan:   Post-operative Plan:   Informed Consent: I have reviewed the patients History and Physical, chart, labs and discussed the procedure including the risks, benefits and alternatives for the proposed anesthesia with the patient or authorized representative who has indicated his/her understanding and acceptance.     Dental advisory given  Plan Discussed with: CRNA and Anesthesiologist  Anesthesia Plan Comments:        Anesthesia Quick Evaluation

## 2019-01-19 NOTE — Progress Notes (Signed)
PROGRESS NOTE    Billy Mendoza  LKT:[id number] DOB: 07-Nov-1950 DOA: 01/18/2019 PCP: Merry Proud, MD   Brief Narrative:  HPI On 01/18/2019 by Dr. Ivor Costa Billy Mendoza is a 68 y.o. male with medical history significant of rectal colon cancer metastasized to liver and lung on chemotherapy, hypertension, iron deficiency anemia, PE on Xarelto, who presents with nausea, vomiting, abdominal pain and constipation.  Patient states that he has been constipated in the past 7 days.  He has not had bowel movement for 7 days.  He developed nausea, vomiting and abdominal pain.  He vomited twice with non-biliary nonbloody vomitus today.  Her abdominal pain is located mainly in lower abdomen, cramping-like, intermittent, 8 out of 10 severity, nonradiating.  Patient states that she still passing gas intermittently.  No diarrhea. He tried to use magnesium citrate and MiraLAX without significant help. Denies fever or chills.  Patient does not have chest pain, shortness breath, cough, fever or chills.  Denies symptoms of UTI or unilateral weakness.  No sick contact to COVID-19 personnel.  Assessment & Plan   Large bowel obstruction -Presented with abdominal pain, nausea and vomiting along with constipation -CT and pelvis showed colonic obstruction resulting from rectal carcinoma.   -Currently n.p.o. -Continue IV fluids, antiemetics, pain control -General surgery consulted and appreciated.  Discussed with Dr. Donne Hazel, gastroenterology consulted for flexible sigmoidoscopy  History of pulmonary embolism -Currently no complaints of chest pain or shortness of breath -Xarelto currently held, currently on IV heparin  Rectal cancer with metastasis to liver and lung -Patient follows with oncology, Dr. Burr Medico -Currently on chemotherapy, last dose approximately 3 weeks ago -CT abd/pelvis: Hepatic metastatic disease which is decreased in size compared to prior examination 11/04/2018.  Multiple right middle lobe  and right lower lobe pulmonary nodules.  Iron deficiency anemia -Hemoglobin appears to be stable, continue to monitor CBC -FOBT negative  Hypokalemia -Continue to replace and monitor BMP -Magnesium 2.3   DVT Prophylaxis Heparin  Code Status: Full  Family Communication: None at bedside  Disposition Plan: Admitted.  Pending further GI and surgical recommendations  Consultants General surgery Gastroenterology  Procedures  None  Antibiotics   Anti-infectives (From admission, onward)   None      Subjective:   Billy Mendoza seen and examined today.  Does admit to having one episode of vomiting this morning.  Denies current nausea or abdominal pain.  Denies chest pain, shortness of breath, dizziness or headache.  Inquires about a tube for suctioning out stomach contents.  Objective:   Vitals:   01/18/19 2243 01/18/19 2305 01/18/19 2314 01/19/19 0433  BP:   (!) 150/96 (!) 155/91  Pulse: 90  84 84  Resp: 18  (!) 24 (!) 21  Temp:   98.3 F (36.8 C) 98.5 F (36.9 C)  TempSrc:   Oral Oral  SpO2: 98%  97% 96%  Weight:  110.2 kg    Height:  6\' 1"  (1.854 m)      Intake/Output Summary (Last 24 hours) at 01/19/2019 0953 Last data filed at 01/19/2019 0559 Gross per 24 hour  Intake 2471.8 ml  Output 550 ml  Net 1921.8 ml   Filed Weights   01/18/19 2200 01/18/19 2305  Weight: 110.2 kg 110.2 kg    Exam  General: Well developed, well nourished, NAD, appears stated age  HEENT: NCAT, mucous membranes moist.   Neck: Supple  Cardiovascular: S1 S2 auscultated, RRR, no murmur  Respiratory: Clear to auscultation bilaterally   Abdomen: Soft, nontender, distended,  decreased bowel sounds, umbilical hernia  Extremities: warm dry without cyanosis clubbing or edema  Neuro: AAOx3, nonfocal  Psych: Does not, appropriate mood and affect   Data Reviewed: I have personally reviewed following labs and imaging studies  CBC: Recent Labs  Lab 01/18/19 1928 01/19/19 0341   WBC 6.7 6.2  NEUTROABS 5.3  --   HGB 9.9* 9.6*  HCT 31.1* 29.9*  MCV 113.5* 113.3*  PLT 180 160   Basic Metabolic Panel: Recent Labs  Lab 01/18/19 1928 01/19/19 0341  NA 139 138  K 2.4* 3.1*  CL 111 110  CO2 18* 20*  GLUCOSE 133* 125*  BUN 18 17  CREATININE 0.76 0.71  CALCIUM 7.5* 8.1*  MG  --  2.3   GFR: Estimated Creatinine Clearance: 119.8 mL/min (by C-G formula based on SCr of 0.71 mg/dL). Liver Function Tests: Recent Labs  Lab 01/18/19 1928  AST 25  ALT 28  ALKPHOS 135*  BILITOT 0.8  PROT 6.7  ALBUMIN 3.1*   Recent Labs  Lab 01/18/19 1928  LIPASE 27   No results for input(s): AMMONIA in the last 168 hours. Coagulation Profile: Recent Labs  Lab 01/18/19 2157  INR 1.3*   Cardiac Enzymes: No results for input(s): CKTOTAL, CKMB, CKMBINDEX, TROPONINI in the last 168 hours. BNP (last 3 results) No results for input(s): PROBNP in the last 8760 hours. HbA1C: No results for input(s): HGBA1C in the last 72 hours. CBG: Recent Labs  Lab 01/19/19 0732  GLUCAP 122*   Lipid Profile: No results for input(s): CHOL, HDL, LDLCALC, TRIG, CHOLHDL, LDLDIRECT in the last 72 hours. Thyroid Function Tests: No results for input(s): TSH, T4TOTAL, FREET4, T3FREE, THYROIDAB in the last 72 hours. Anemia Panel: No results for input(s): VITAMINB12, FOLATE, FERRITIN, TIBC, IRON, RETICCTPCT in the last 72 hours. Urine analysis:    Component Value Date/Time   PROTEINUR NEGATIVE 01/04/2019 0954   Sepsis Labs: @LABRCNTIP (procalcitonin:4,lacticidven:4)  )No results found for this or any previous visit (from the past 240 hour(s)).    Radiology Studies: Ct Abdomen Pelvis W Contrast  Result Date: 01/18/2019 CLINICAL DATA:  No bowel movement for 1 week, abdominal pain EXAM: CT ABDOMEN AND PELVIS WITH CONTRAST TECHNIQUE: Multidetector CT imaging of the abdomen and pelvis was performed using the standard protocol following bolus administration of intravenous contrast.  CONTRAST:  115mL OMNIPAQUE IOHEXOL 300 MG/ML  SOLN COMPARISON:  11/04/2018 FINDINGS: Lower chest: Right middle lobe pulmonary nodules with the largest measuring 10 mm enlarged compared with the prior examination. 5 mm right lower lobe pulmonary nodule. Hepatobiliary: Multiple hypodense hepatic masses with the largest in the right hepatic lobe near the dome of the liver measuring 4.3 x 2.8 cm decreased in size compared with 11/04/2018. No gallstones, gallbladder wall thickening, or biliary dilatation. Pancreas: Unremarkable. No pancreatic ductal dilatation or surrounding inflammatory changes. Spleen: Normal in size without focal abnormality. Adrenals/Urinary Tract: Adrenal glands are unremarkable. Bilateral hypodense, fluid attenuating renal masses consistent with cysts. No urolithiasis or obstructive uropathy. Bladder is unremarkable. Stomach/Bowel: Stomach is normal. Severe colonic distension to the level of the rectum. Colon is fluid-filled. Focal area of rectal wall thickening most consistent with rectal carcinoma with perirectal tumor along the left lateral aspect measuring 1.5 x 1.2 cm. Stable presacral edema. No pneumatosis, pneumoperitoneum or portal venous gas. Vascular/Lymphatic: Abdominal aortic atherosclerosis. Normal caliber abdominal aorta. No lymphadenopathy. Reproductive: Prostate is unremarkable. Other: No fluid collection or hematoma. Fat containing right inguinal hernia. Musculoskeletal: No acute osseous abnormality. No aggressive osseous lesion. IMPRESSION:  1. Colonic obstruction resulting from rectal carcinoma. 2. Hepatic metastatic disease which is decreased in size compared with the prior examination of 11/04/2018. 3. Multiple right middle lobe and right lower lobe pulmonary nodules most consistent with metastatic disease. Electronically Signed   By: Kathreen Devoid   On: 01/18/2019 21:11     Scheduled Meds: Continuous Infusions: . sodium chloride 125 mL/hr at 01/19/19 0559  . potassium  chloride 10 mEq (01/19/19 0942)     LOS: 1 day   Time Spent in minutes   45 minutes (greater than 50% of time spent with patient face to face, as well as reviewing old records, calling consults, and formulating a plan)   Cristal Ford D.O. on 01/19/2019 at 9:53 AM  Between 7am to 7pm - Please see pager noted on [email or web address]  After 7pm go to [email or web address]  And look for the night coverage person covering for me after hours  [facility] Office  [phone]

## 2019-01-19 NOTE — Consult Note (Signed)
Reason for Consult:large bowel obstruction Referring Physician: Dr Karene Fry Billy Mendoza is an 68 y.o. male.  HPI: 34 yom with [name] treated by Dr Burr Medico with mets to liver and lung, pe on xarelto also presents with one week of worsening abdominal distention and decreased passage of stool and eventually flatus as well. Little emesis, no real nausea now either.  Not eating much. Tried to get better with laxatives. No fevers. Last chemo was one month ago (did receive avastin).  He underwent ct scan that shows obstruction at level of rectum. When I see him this am he is bloated but no pain or n/v.    Past Medical History:  Diagnosis Date  . Cancer ([place])   [name] PE on xarelto  Past Surgical History:  Procedure Laterality Date  . IR IMAGING GUIDED PORT INSERTION  08/16/2018  . IR US GUIDE BX ASP/DRAIN  08/16/2018    Family History  Problem Relation Age of Onset  . Cancer Mother 3       breast cancer  . Atrial fibrillation Father   . Heart failure Father   . Cancer Maternal Grandmother        lung cancer     Social History:  reports that he has quit smoking. His smoking use included cigarettes and cigars. He quit after 10.00 years of use. He has never used smokeless tobacco. He reports current alcohol use of about 1.0 - 2.0 standard drinks of alcohol per week. He reports that he does not use drugs.  Allergies: No Known Allergies  Medications: reviewed, include xarelto which he took yesterday am  Results for orders placed or performed during the hospital encounter of 01/18/19 (from the past 48 hour(s))  POC occult blood, ED Provider will collect     Status: None   Collection Time: 01/18/19  7:13 PM  Result Value Ref Range   Fecal Occult Bld NEGATIVE NEGATIVE  Comprehensive metabolic panel     Status: Abnormal   Collection Time: 01/18/19  7:28 PM  Result Value Ref Range   Sodium 139 135 - 145 mmol/L   Potassium 2.4 (LL) 3.5 - 5.1 mmol/L     Comment: CRITICAL RESULT CALLED TO, READ BACK BY AND VERIFIED WITH: RN [facility] AT 2016 01/18/19 CRUICKSHANK A    Chloride 111 98 - 111 mmol/L   CO2 18 (L) 22 - 32 mmol/L   Glucose, Bld 133 (H) 70 - 99 mg/dL   BUN 18 8 - 23 mg/dL   Creatinine, Ser 0.76 0.61 - 1.24 mg/dL   Calcium 7.5 (L) 8.9 - 10.3 mg/dL   Total Protein 6.7 6.5 - 8.1 g/dL   Albumin 3.1 (L) 3.5 - 5.0 g/dL   AST 25 15 - 41 U/L   ALT 28 0 - 44 U/L   Alkaline Phosphatase 135 (H) 38 - 126 U/L   Total Bilirubin 0.8 0.3 - 1.2 mg/dL   GFR calc non Af Amer >60 >60 mL/min   GFR calc Af Amer >60 >60 mL/min   Anion gap 10 5 - 15    Comment: Performed at [facility], [place] [address] [place], [place] [address]  CBC with Differential     Status: Abnormal   Collection Time: 01/18/19  7:28 PM  Result Value Ref Range   WBC 6.7 4.0 - 10.5 K/uL   RBC 2.74 (L) 4.22 - 5.81 MIL/uL   Hemoglobin 9.9 (L) 13.0 - 17.0 g/dL   HCT  31.1 (L) 39.0 - 52.0 %   MCV 113.5 (H) 80.0 - 100.0 fL   MCH 36.1 (H) 26.0 - 34.0 pg   MCHC 31.8 30.0 - 36.0 g/dL   RDW 18.6 (H) 11.5 - 15.5 %   Platelets 180 150 - 400 K/uL   nRBC 0.0 0.0 - 0.2 %   Neutrophils Relative % 79 %   Neutro Abs 5.3 1.7 - 7.7 K/uL   Lymphocytes Relative 13 %   Lymphs Abs 0.8 0.7 - 4.0 K/uL   Monocytes Relative 8 %   Monocytes Absolute 0.6 0.1 - 1.0 K/uL   Eosinophils Relative 0 %   Eosinophils Absolute 0.0 0.0 - 0.5 K/uL   Basophils Relative 0 %   Basophils Absolute 0.0 0.0 - 0.1 K/uL   Immature Granulocytes 0 %   Abs Immature Granulocytes 0.02 0.00 - 0.07 K/uL   Polychromasia PRESENT     Comment: Performed at [facility], [place] [address] [place], [place] [address]  Lipase, blood     Status: None   Collection Time: 01/18/19  7:28 PM  Result Value Ref Range   Lipase 27 11 - 51 U/L    Comment: Performed at [facility], [place] [address], [place], [place] [address]  Protime-INR     Status: Abnormal   Collection Time:  01/18/19  9:57 PM  Result Value Ref Range   Prothrombin Time 16.3 (H) 11.4 - 15.2 seconds   INR 1.3 (H) 0.8 - 1.2    Comment: (NOTE) INR goal varies based on device and disease states. Performed at [facility], [place] [address] [place], [place] [address]   APTT     Status: Abnormal   Collection Time: 01/18/19  9:57 PM  Result Value Ref Range   aPTT 39 (H) 24 - 36 seconds    Comment:        IF BASELINE aPTT IS ELEVATED, SUGGEST PATIENT RISK ASSESSMENT BE USED TO DETERMINE APPROPRIATE ANTICOAGULANT THERAPY. Performed at [facility], [place] [address], [place], [place] [address]   Type and screen [place]     Status: None   Collection Time: 01/18/19  9:58 PM  Result Value Ref Range   ABO/RH(D) O POS    Antibody Screen NEG    Sample Expiration      01/21/2019 Performed at [facility], [place] [address] [place], [place] [address]   Heparin level (unfractionated)     Status: Abnormal   Collection Time: 01/18/19 10:03 PM  Result Value Ref Range   Heparin Unfractionated 1.84 (H) 0.30 - 0.70 IU/mL    Comment: RESULTS CONFIRMED BY MANUAL DILUTION Performed at [place] [address] [place],  [address]   ABO/Rh     Status: None   Collection Time: 01/18/19 10:10 PM  Result Value Ref Range   ABO/RH(D)      O POS Performed at [facility], [place] [address] [place],  [address]   Magnesium     Status: None   Collection Time: 01/19/19  3:41 AM  Result Value Ref Range   Magnesium 2.3 1.7 - 2.4 mg/dL    Comment: Performed at [facility], [place] [address], [place],  [address]  Basic metabolic panel     Status: Abnormal   Collection Time: 01/19/19  3:41 AM  Result Value Ref Range   Sodium 138 135 - 145 mmol/L   Potassium 3.1 (  L) 3.5 - 5.1 mmol/L    Comment: DELTA CHECK NOTED   Chloride 110 98 - 111 mmol/L   CO2 20 (L) 22 - 32  mmol/L   Glucose, Bld 125 (H) 70 - 99 mg/dL   BUN 17 8 - 23 mg/dL   Creatinine, Ser 0.71 0.61 - 1.24 mg/dL   Calcium 8.1 (L) 8.9 - 10.3 mg/dL   GFR calc non Af Amer >60 >60 mL/min   GFR calc Af Amer >60 >60 mL/min   Anion gap 8 5 - 15    Comment: Performed at [facility], [place] [address] [place], [place] [address]  CBC     Status: Abnormal   Collection Time: 01/19/19  3:41 AM  Result Value Ref Range   WBC 6.2 4.0 - 10.5 K/uL   RBC 2.64 (L) 4.22 - 5.81 MIL/uL   Hemoglobin 9.6 (L) 13.0 - 17.0 g/dL   HCT 29.9 (L) 39.0 - 52.0 %   MCV 113.3 (H) 80.0 - 100.0 fL   MCH 36.4 (H) 26.0 - 34.0 pg   MCHC 32.1 30.0 - 36.0 g/dL   RDW 18.3 (H) 11.5 - 15.5 %   Platelets 177 150 - 400 K/uL   nRBC 0.0 0.0 - 0.2 %    Comment: Performed at [facility], [place] [address], [place], [place] [address]  Glucose, capillary     Status: Abnormal   Collection Time: 01/19/19  7:32 AM  Result Value Ref Range   Glucose-Capillary 122 (H) 70 - 99 mg/dL    Ct Abdomen Pelvis W Contrast  Result Date: 01/18/2019 CLINICAL DATA:  No bowel movement for 1 week, abdominal pain EXAM: CT ABDOMEN AND PELVIS WITH CONTRAST TECHNIQUE: Multidetector CT imaging of the abdomen and pelvis was performed using the standard protocol following bolus administration of intravenous contrast. CONTRAST:  135mL OMNIPAQUE IOHEXOL 300 MG/ML  SOLN COMPARISON:  11/04/2018 FINDINGS: Lower chest: Right middle lobe pulmonary nodules with the largest measuring 10 mm enlarged compared with the prior examination. 5 mm right lower lobe pulmonary nodule. Hepatobiliary: Multiple hypodense hepatic masses with the largest in the right hepatic lobe near the dome of the liver measuring 4.3 x 2.8 cm decreased in size compared with 11/04/2018. No gallstones, gallbladder wall thickening, or biliary dilatation. Pancreas: Unremarkable. No pancreatic ductal dilatation or surrounding inflammatory changes. Spleen: Normal in size without  focal abnormality. Adrenals/Urinary Tract: Adrenal glands are unremarkable. Bilateral hypodense, fluid attenuating renal masses consistent with cysts. No urolithiasis or obstructive uropathy. Bladder is unremarkable. Stomach/Bowel: Stomach is normal. Severe colonic distension to the level of the rectum. Colon is fluid-filled. Focal area of rectal wall thickening most consistent with rectal carcinoma with perirectal tumor along the left lateral aspect measuring 1.5 x 1.2 cm. Stable presacral edema. No pneumatosis, pneumoperitoneum or portal venous gas. Vascular/Lymphatic: Abdominal aortic atherosclerosis. Normal caliber abdominal aorta. No lymphadenopathy. Reproductive: Prostate is unremarkable. Other: No fluid collection or hematoma. Fat containing right inguinal hernia. Musculoskeletal: No acute osseous abnormality. No aggressive osseous lesion. IMPRESSION: 1. Colonic obstruction resulting from rectal carcinoma. 2. Hepatic metastatic disease which is decreased in size compared with the prior examination of 11/04/2018. 3. Multiple right middle lobe and right lower lobe pulmonary nodules most consistent with metastatic disease. Electronically Signed   By: Kathreen Devoid   On: 01/18/2019 21:11    Review of Systems  Gastrointestinal: Positive for abdominal pain, constipation, nausea and vomiting.  All other systems reviewed and are negative.  Blood pressure (!) 155/91,  pulse 84, temperature 98.5 F (36.9 C), temperature source Oral, resp. rate (!) 21, height 6\' 1"  (1.854 m), weight 110.2 kg, SpO2 96 %. Physical Exam  Vitals reviewed. Constitutional: He is oriented to person, place, and time. He appears well-developed and well-nourished.  HENT:  Head: Normocephalic and atraumatic.  Right Ear: External ear normal.  Left Ear: External ear normal.  Eyes: Pupils are equal, round, and reactive to light. No scleral icterus.  Neck: Neck supple.  Cardiovascular: Normal rate, regular rhythm and normal heart  sounds.  Respiratory: Effort normal and breath sounds normal.  GI: He exhibits distension. Bowel sounds are decreased. There is no abdominal tenderness. A hernia (reducible umbilical) is present.  Musculoskeletal:        General: No edema.  Neurological: He is alert and oriented to person, place, and time.  Skin: Skin is warm and dry. He is not diaphoretic.  Psychiatric: He has a normal mood and affect. His behavior is normal.    Assessment/Plan: Large bowel obstruction secondary to rectal cancer [name] VTE on xarelto  -I have reviewed imaging and cancer history.  He appears to have obstruction secondary to his rectal cancer.  He is not in need of urgent surgical exploration now. I have discussed with Dr Benson Norway about options. He is going to look with flex sig today for possible stent which I think would be best option. If not amenable to stenting then I discussed with patient a likely loop colostomy to decompress him. I dont think any other real option given stage IV disease, recent chemo with avastin.  He states does not want to do that but may change mind if no other option.  Will reassess after scope today. If not able will need to agree to procedure. If not urgent may be tomorrow am due to xarelto as he has had bleeding issues on this.   -I have updated his wife Billy Mendoza by phone, she works as a Personnel officer in [employer] -hold heparin now for flex sig at 1300 -I have sent Dr Burr Medico a message about his admission  Billy Mendoza 01/19/2019, 9:00 AM

## 2019-01-19 NOTE — Transfer of Care (Signed)
Immediate Anesthesia Transfer of Care Note  Patient: Billy Mendoza  Procedure(s) Performed: FLEXIBLE SIGMOIDOSCOPY (N/A ) COLONIC STENT PLACEMENT (N/A )  Patient Location: PACU and Endoscopy Unit  Anesthesia Type:MAC  Level of Consciousness: awake and alert   Airway & Oxygen Therapy: Patient Spontanous Breathing and Patient connected to face mask oxygen  Post-op Assessment: Report given to RN and Post -op Vital signs reviewed and stable  Post vital signs: Reviewed and stable  Last Vitals:  Vitals Value Taken Time  BP 156/92 01/19/2019  2:13 PM  Temp    Pulse 72 01/19/2019  2:14 PM  Resp 14 01/19/2019  2:14 PM  SpO2 100 % 01/19/2019  2:14 PM  Vitals shown include unvalidated device data.  Last Pain:  Vitals:   01/19/19 1213  TempSrc: Oral  PainSc: 3       Patients Stated Pain Goal: 2 (36/64/40 3474)  Complications: No apparent anesthesia complications

## 2019-01-19 NOTE — Op Note (Signed)
[facility] Patient Name: Billy Mendoza Procedure Date: 01/19/2019 MRN: [id number] Attending MD: Carol Ada , MD Date of Birth: 08-Jun-1951 CSN: [id number] Age: 68 Admit Type: Inpatient Procedure:                Flexible Sigmoidoscopy Indications:              Abnormal CT of the GI tract Providers:                Carol Ada, MD, Cleda Daub, RN, Cletis Athens,                            Technician Referring MD:              Medicines:                General Anesthesia Complications:            No immediate complications. Estimated Blood Loss:     Estimated blood loss: none. Procedure:                Pre-Anesthesia Assessment:                           - Prior to the procedure, a History and Physical                            was performed, and patient medications and                            allergies were reviewed. The patient's tolerance of                            previous anesthesia was also reviewed. The risks                            and benefits of the procedure and the sedation                            options and risks were discussed with the patient.                            All questions were answered, and informed consent                            was obtained. Prior Anticoagulants: The patient has                            taken heparin, last dose was day of procedure. ASA                            Grade Assessment: III - A patient with severe                            systemic disease. After reviewing the risks and  benefits, the patient was deemed in satisfactory                            condition to undergo the procedure.                           - Sedation was administered by an anesthesia                            professional. General anesthesia was attained.                           After obtaining informed consent, the scope was                            passed under direct vision. The [id number]-[id number]  [id number]                            Olympus gastroscope was introduced through the anus                            and advanced to the the sigmoid colon. The flexible                            sigmoidoscopy was technically difficult and                            complex. The quality of the bowel preparation was                            adequate. Scope In: Scope Out: Findings:      A fungating, infiltrative and ulcerated completely obstructing large       mass was found in the rectum. The mass was circumferential. The mass       measured four cm in length. No bleeding was present. This was stented       with a 22 mm x 6 cm WallFlex stent. Estimated blood loss: none.      At approximately 8 cm from the anal verge the distal portion of the       tumor was encountered. It was not able to be traversed with the adult       EGD scope. The pediatric EGD scope was used and the tumor was easily       traversed. The length of the tumor measured 4 cm, which was the prior       measurement at the time of diagnosis in 07/2018. Under fluoroscopy a       guidewire was advanced into the colon and external markers were placed       at the proximal and distal bounderies of the tumor. Over the guidewire       the 22 mm x 60 mm stent was easily advanced and deployed. Excellent       placement was achieved and this was confirmed fluoroscopically and       endoscopically. The patient immediately started to pass liquid stool and       flatus. There was a reduction in his  abdominal distention with palpation. Impression:               - Malignant completely obstructing tumor in the                            rectum. Prosthesis placed.                           - No specimens collected. Moderate Sedation:      Not Applicable - Patient had care per Anesthesia. Recommendation:           - Return patient to hospital ward for ongoing care.                           - NPO. Procedure Code(s):        ---  Professional ---                           [id number], Sigmoidoscopy, flexible; with placement of                            endoscopic stent (includes pre- and post-dilation                            and guide wire passage, when performed) Diagnosis Code(s):        --- Professional ---                           C20, Malignant neoplasm of rectum                           K56.691, Other complete intestinal obstruction                           R93.3, Abnormal findings on diagnostic imaging of                            other parts of digestive tract CPT copyright 2019 [employer]. All rights reserved. The codes documented in this report are preliminary and upon coder review may  be revised to meet current compliance requirements. Carol Ada, MD Carol Ada, MD 01/19/2019 2:05:48 PM This report has been signed electronically. Number of Addenda: 0

## 2019-01-19 NOTE — Anesthesia Procedure Notes (Signed)
Date/Time: 01/19/2019 1:58 PM Performed by: Cynda Familia, CRNA Oxygen Delivery Method: Simple face mask Placement Confirmation: positive ETCO2 and breath sounds checked- equal and bilateral

## 2019-01-19 NOTE — Progress Notes (Signed)
Initial Nutrition Assessment  DOCUMENTATION CODES:   Obesity unspecified  INTERVENTION:   If patient unable to have diet advanced given LBO for >7 days, then recommend consideration of TPN.  Once diet advanced, recommend Ensure Enlive po BID, each supplement provides 350 kcal and 20 grams of protein  NUTRITION DIAGNOSIS:   Increased nutrient needs related to cancer and cancer related treatments as evidenced by estimated needs.  GOAL:   Patient will meet greater than or equal to 90% of their needs  MONITOR:   Diet advancement, Labs, Weight trends, I & O's  REASON FOR ASSESSMENT:   Malnutrition Screening Tool    ASSESSMENT:   68 y.o. male with medical history significant of rectal colon cancer metastasized to liver and lung on chemotherapy, hypertension, iron deficiency anemia, PE on Xarelto, who presents with nausea, vomiting, abdominal pain and constipation. Admitted for large bowel obstruction.  4/15: CT reveals colonic obstruction  **RD working remotely**  Per chart review, pt was diagnosed with stage IV metastatic rectal cancer in November 2019. Pt was started on chemotherapy, last dose 12/22/18. Pt followed by [place] RD, last seen 10/19/18. Pt denied nutrition impact symptoms at that time. Pt was trying to drink supplements but needed to find one that he could tolerate at room temperature.  Patient currently NPO d/t large bowel obstruction. Pt has not been able to eat normally for >7 days given constipation (no BM x 7 days) and abdominal pain. Eventually pt developed N/V which continues today. Per surgery note, GI to perform flex sigmoidoscopy with possible stent placement.    Pt's UBW is 273 lb. Per weight records, pt has lost 7 lb since 09/22/18 (2% wt loss x 4 months, insignificant for time frame).  Labs reviewed: CBGs: 122 Low K Mg WNL   NUTRITION - FOCUSED PHYSICAL EXAM:  Unable to perform per department requirements to work remotely.  Diet Order:    Diet Order            Diet NPO time specified Except for: Sips with Meds, Ice Chips  Diet effective now              EDUCATION NEEDS:   No education needs have been identified at this time  Skin:  Skin Assessment: Reviewed RN Assessment  Last BM:  PTA  Height:   Ht Readings from Last 1 Encounters:  01/18/19 6\' 1"  (1.854 m)    Weight:   Wt Readings from Last 1 Encounters:  01/18/19 110.2 kg    Ideal Body Weight:  83.6 kg  BMI:  Body mass index is 32.06 kg/m.  Estimated Nutritional Needs:   Kcal:  6606-3016  Protein:  120-130g  Fluid:  2.3L/day  Clayton Bibles, MS, RD, LDN [facility] Pager: [phone] After Hours Pager: [phone]

## 2019-01-19 NOTE — Progress Notes (Signed)
ANTICOAGULATION CONSULT NOTE - Follow Up Consult  Pharmacy Consult for IV heparin Indication: pulmonary embolus on PTA xarelto, pending surgery  No Known Allergies  Patient Measurements: Height: 6\' 1"  (185.4 cm) Weight: 242 lb 15.2 oz (110.2 kg) IBW/kg (Calculated) : 79.9 Heparin Dosing Weight: 89 kg  Vital Signs: Temp: 98.6 F (37 C) (04/16 1536) Temp Source: Oral (04/16 1536) BP: 155/95 (04/16 1536) Pulse Rate: 78 (04/16 1536)  Labs: Recent Labs    01/18/19 1928 01/18/19 2157 01/18/19 2203 01/19/19 0341 01/19/19 0822  HGB 9.9*  --   --  9.6*  --   HCT 31.1*  --   --  29.9*  --   PLT 180  --   --  177  --   APTT  --  39*  --   --  147*  LABPROT  --  16.3*  --   --   --   INR  --  1.3*  --   --   --   HEPARINUNFRC  --   --  1.84*  --  1.72*  CREATININE 0.76  --   --  0.71  --     Estimated Creatinine Clearance: 119.8 mL/min (by C-G formula based on SCr of 0.71 mg/dL).   Medical History: Past Medical History:  Diagnosis Date  . Cancer ([place])     Medications:  Scheduled:   Infusions:  . sodium chloride 125 mL/hr at 01/19/19 1505  . lactated ringers 1,000 mL (01/19/19 1223)    Assessment: 66 yoM with [name] who presents with bowel obstruction. Surgery consulted, plan for flex sig 4/16. Patient is on chronic xarelto for hx of PE which is now on hold and pharmacy is consulted to dose IV heparin.  Today, 01/19/2019  APTT supratherapeutic (147) on heparin 1600 units/hr - using aPTT to monitor heparin since heparin levels are elevated due to residual effects of Xarelto.  CBC: Hgb 9.6 consistent with previous values, Plts wnl  Per d/w Dr. Benson [place] ok to resume heparin drip post procedure  Goal of Therapy:  Heparin level 0.3-0.7 units/ml aPTT 66-102 seconds Monitor platelets by anticoagulation protocol: Yes   Plan:  Resume heparin drip at decreased rate of 1200 units/hr Check heparin level and aPTT in 8 hours Monitor for signs/symptoms of  bleeding  Dolly Rias RPh 01/19/2019, 4:03 PM Pager [phone]

## 2019-01-19 NOTE — Anesthesia Postprocedure Evaluation (Signed)
Anesthesia Post Note  Patient: Billy Mendoza  Procedure(s) Performed: FLEXIBLE SIGMOIDOSCOPY (N/A ) COLONIC STENT PLACEMENT (N/A )     Patient location during evaluation: Endoscopy Anesthesia Type: MAC Level of consciousness: awake and alert Pain management: pain level controlled Vital Signs Assessment: post-procedure vital signs reviewed and stable Respiratory status: spontaneous breathing, nonlabored ventilation, respiratory function stable and patient connected to nasal cannula oxygen Cardiovascular status: blood pressure returned to baseline and stable Postop Assessment: no apparent nausea or vomiting Anesthetic complications: no    Last Vitals:  Vitals:   01/19/19 1420 01/19/19 1429  BP: 136/72 (!) 144/94  Pulse: 72 75  Resp: 17 20  Temp:  36.8 C  SpO2: 96% 100%    Last Pain:  Vitals:   01/19/19 1429  TempSrc: Oral  PainSc:                  [name]

## 2019-01-19 NOTE — Progress Notes (Signed)
[place] INPATIENT PROGRESS NOTE  SUBJECTIVE: Billy Mendoza 68 y.o. male with metastatic rectal adenocarcinoma. MSS,KRAS/NRAS wild type,BRAF V600E mutation (+).  He has metastatic disease to the liver, lungs, and lymph nodes.  Last chemotherapy was given on 12/22/2018.  Chemotherapy was held on 01/04/2019 secondary to thrombocytopenia.  The patient has been having constipation for the past 7 days.  He developed nausea, vomiting, abdominal pain and vomited twice on the day of admission.  He presented to the emergency room and a CT of the abdomen and pelvis with contrast was performed which showed colonic obstruction resulting from his rectal carcinoma.  Also noted on the CT scan the hepatic metastatic disease had decreased in size compared to the prior CT.  There are also multiple right middle lobe and right lower lobe pulmonary nodules consistent with metastatic disease noted on the CT of the abdomen and pelvis.  The patient reports abdominal pain this morning. Receiving Morphine with some relief. Has nausea and vomited this am. No BM in over 1 week. No fevers or chills. No chest pain or shortness of breath. Still having fatigue. No bleeding.    ALLERGIES:  has No Known Allergies.  MEDICATIONS:  Current Facility-Administered Medications  Medication Dose Route Frequency Provider Last Rate Last Dose  . 0.9 %  sodium chloride infusion   Intravenous Continuous Ivor Costa, MD 125 mL/hr at 01/19/19 0559    . acetaminophen (TYLENOL) tablet 650 mg  650 mg Oral Q6H PRN Ivor Costa, MD       Or  . acetaminophen (TYLENOL) suppository 650 mg  650 mg Rectal Q6H PRN Ivor Costa, MD      . heparin ADULT infusion 100 units/mL (25000 units/284m sodium chloride 0.45%)  1,600 Units/hr Intravenous Continuous GDorrene German RPH 16 mL/hr at 01/19/19 0559 1,600 Units/hr at 01/19/19 0559  . hydrALAZINE (APRESOLINE) injection 5 mg  5 mg Intravenous Q2H PRN NIvor Costa MD      . morphine 2 MG/ML  injection 2 mg  2 mg Intravenous Q4H PRN NIvor Costa MD   2 mg at 01/19/19 0739  . ondansetron (ZOFRAN) tablet 4 mg  4 mg Oral Q6H PRN NIvor Costa MD       Or  . ondansetron ([facility] injection 4 mg  4 mg Intravenous Q6H PRN NIvor Costa MD   4 mg at 01/19/19 0430  . potassium chloride 10 mEq in 100 mL IVPB  10 mEq Intravenous Q1 Hr x 4 MCristal Ford DO 100 mL/hr at 01/19/19 0839 10 mEq at 01/19/19 0839  . promethazine (PHENERGAN) injection 12.5 mg  12.5 mg Intravenous Q6H PRN Mikhail, MVelta Addison DO      . sodium chloride flush (NS) 0.9 % injection 10-40 mL  10-40 mL Intracatheter PRN NIvor Costa MD   10 mL at 01/19/19 0343    REVIEW OF SYSTEMS:   Review of Systems  Review of Systems  Constitutional: Positive for malaise/fatigue. Negative for chills and fever.  HENT: Negative.   Eyes: Negative.   Respiratory: Negative.   Cardiovascular: Negative.   Gastrointestinal: Positive for abdominal pain, constipation, nausea and vomiting. Negative for diarrhea.  Genitourinary: Negative.   Musculoskeletal: Negative.   Neurological: Negative.   Endo/Heme/Allergies: Negative.   Psychiatric/Behavioral: Negative.       PHYSICAL EXAMINATION:  Vital signs in last 24 hours: Temp:  [97.9 F (36.6 C)-98.5 F (36.9 C)] 98.5 F (36.9 C) (04/16 0433) Pulse Rate:  [84-110] 84 (04/16 0433) Resp:  [9-24] 21 (04/16 0433)  BP: (127-159)/(90-105) 155/91 (04/16 0433) SpO2:  [95 %-99 %] 96 % (04/16 0433) Weight:  [243 lb (110.2 kg)] 243 lb (110.2 kg) (04/15 2305) Weight change:  Last BM Date: (over a week ago per pt )  Intake/Output from previous day: 04/15 0701 - 04/16 0700 In: 2471.8 [I.V.:1075.3; IV Piggyback:1396.5] Out: 550 [Urine:550] General: Alert, awake without distress. Head: Normocephalic atraumatic. Mouth: mucous membranes moist, pharynx normal without lesions Eyes: No scleral icterus.  Pupils are equal and round reactive to light. Resp: clear to auscultation bilaterally without rhonchi or  wheezes or dullness to percussion. Cardio: regular rate and rhythm, S1, S2 normal, no murmur, click, rub or gallop GI: hypoactive bowel sounds. Distended. Tenderness with palpation over the lower abdomen.  Musculoskeletal: No joint deformity or effusion. Neurological: No motor, sensory deficits.  Skin: No rashes or lesions.  Portacath/PICC-without erythema  LABORATORY DATA: Lab Results  Component Value Date   WBC 6.2 01/19/2019   HGB 9.6 (L) 01/19/2019   HCT 29.9 (L) 01/19/2019   MCV 113.3 (H) 01/19/2019   PLT 177 01/19/2019    CMP Latest Ref Rng & Units 01/19/2019 01/18/2019 01/04/2019  Glucose 70 - 99 mg/dL 125(H) 133(H) 95  BUN 8 - 23 mg/dL 17 18 13   Creatinine 0.61 - 1.24 mg/dL 0.71 0.76 0.88  Sodium 135 - 145 mmol/L 138 139 137  Potassium 3.5 - 5.1 mmol/L 3.1(L) 2.4(LL) 4.0  Chloride 98 - 111 mmol/L 110 111 107  CO2 22 - 32 mmol/L 20(L) 18(L) 19(L)  Calcium 8.9 - 10.3 mg/dL 8.1(L) 7.5(L) 9.1  Total Protein 6.5 - 8.1 g/dL - 6.7 7.4  Total Bilirubin 0.3 - 1.2 mg/dL - 0.8 0.4  Alkaline Phos 38 - 126 U/L - 135(H) 146(H)  AST 15 - 41 U/L - 25 25  ALT 0 - 44 U/L - 28 25     RADIOGRAPHIC STUDIES:  Ct Abdomen Pelvis W Contrast  Result Date: 01/18/2019 CLINICAL DATA:  No bowel movement for 1 week, abdominal pain EXAM: CT ABDOMEN AND PELVIS WITH CONTRAST TECHNIQUE: Multidetector CT imaging of the abdomen and pelvis was performed using the standard protocol following bolus administration of intravenous contrast. CONTRAST:  137m OMNIPAQUE IOHEXOL 300 MG/ML  SOLN COMPARISON:  11/04/2018 FINDINGS: Lower chest: Right middle lobe pulmonary nodules with the largest measuring 10 mm enlarged compared with the prior examination. 5 mm right lower lobe pulmonary nodule. Hepatobiliary: Multiple hypodense hepatic masses with the largest in the right hepatic lobe near the dome of the liver measuring 4.3 x 2.8 cm decreased in size compared with 11/04/2018. No gallstones, gallbladder wall thickening,  or biliary dilatation. Pancreas: Unremarkable. No pancreatic ductal dilatation or surrounding inflammatory changes. Spleen: Normal in size without focal abnormality. Adrenals/Urinary Tract: Adrenal glands are unremarkable. Bilateral hypodense, fluid attenuating renal masses consistent with cysts. No urolithiasis or obstructive uropathy. Bladder is unremarkable. Stomach/Bowel: Stomach is normal. Severe colonic distension to the level of the rectum. Colon is fluid-filled. Focal area of rectal wall thickening most consistent with rectal carcinoma with perirectal tumor along the left lateral aspect measuring 1.5 x 1.2 cm. Stable presacral edema. No pneumatosis, pneumoperitoneum or portal venous gas. Vascular/Lymphatic: Abdominal aortic atherosclerosis. Normal caliber abdominal aorta. No lymphadenopathy. Reproductive: Prostate is unremarkable. Other: No fluid collection or hematoma. Fat containing right inguinal hernia. Musculoskeletal: No acute osseous abnormality. No aggressive osseous lesion. IMPRESSION: 1. Colonic obstruction resulting from rectal carcinoma. 2. Hepatic metastatic disease which is decreased in size compared with the prior examination of 11/04/2018. 3. Multiple right  middle lobe and right lower lobe pulmonary nodules most consistent with metastatic disease. Electronically Signed   By: Kathreen Devoid   On: 01/18/2019 21:11     ASSESSMENT/PLAN:  68 year old male with metastatic rectal cancer currently receiving systemic chemotherapy.  Patient developed constipation, nausea, vomiting, abdominal pain.  CT scan was consistent with colonic obstruction from his rectal carcinoma.  1.  Metastatic rectal cancer 2.  Colonic obstruction from his rectal cancer. 3.  Anemia 4.  Hypokalemia 5.  Pulmonary embolism 6.  Hypertension  -Patient seen by surgery this am. GI will perform sigmoidoscopy later this morning with possible stent. If stenting is not an option, surgery will consider loop colostomy.   -Anemia is stable. Likely due to prior chemo. Stool for occult blood negative. Monitor Hgb. Transfuse for Hemoglobin <7.0. -Replete K+ per hospitalist.  -On Heparin gtt for PE. Continue dosing per pharmacy. Consider switching back to Xarelto after he has completed procedures.  -Amlodipine currently on hold. BP controlled at this time.    LOS: 1 day   Mikey Bussing, DNP, AGPCNP-BC, AOCNP 01/19/19

## 2019-01-19 NOTE — Progress Notes (Signed)
ANTICOAGULATION CONSULT NOTE - Follow Up Consult  Pharmacy Consult for IV heparin Indication: pulmonary embolus on PTA xarelto, pending surgery  No Known Allergies  Patient Measurements: Height: 6\' 1"  (185.4 cm) Weight: 243 lb (110.2 kg) IBW/kg (Calculated) : 79.9 Heparin Dosing Weight: 89 kg  Vital Signs: Temp: 98.5 F (36.9 C) (04/16 0433) Temp Source: Oral (04/16 0433) BP: 155/91 (04/16 0433) Pulse Rate: 84 (04/16 0433)  Labs: Recent Labs    01/18/19 1928 01/18/19 2157 01/18/19 2203 01/19/19 0341 01/19/19 0822  HGB 9.9*  --   --  9.6*  --   HCT 31.1*  --   --  29.9*  --   PLT 180  --   --  177  --   APTT  --  39*  --   --  147*  LABPROT  --  16.3*  --   --   --   INR  --  1.3*  --   --   --   HEPARINUNFRC  --   --  1.84*  --  1.72*  CREATININE 0.76  --   --  0.71  --     Estimated Creatinine Clearance: 119.8 mL/min (by C-G formula based on SCr of 0.71 mg/dL).   Medical History: Past Medical History:  Diagnosis Date  . Cancer ([place])     Medications:  Scheduled:   Infusions:  . sodium chloride 125 mL/hr at 01/19/19 0559  . potassium chloride 10 mEq (01/19/19 0839)    Assessment: 22 yoM with [name] who presents with bowel obstruction. Surgery consulted, plan for flex sig 4/16. Patient is on chronic xarelto for hx of PE which is now on hold and pharmacy is consulted to dose IV heparin.  Today, 01/19/2019  APTT supratherapeutic (147) on heparin 1600 units/hr - using aPTT to monitor heparin since heparin levels are elevated due to residual effects of Xarelto.  CBC: Hgb 9.6 consistent with previous values, Plts wnl  No bleeding reported per discussion with RN  Goal of Therapy:  Heparin level 0.3-0.7 units/ml aPTT 66-102 seconds Monitor platelets by anticoagulation protocol: Yes   Plan:  Heparin on hold for procedure (stopped ~9a) - follow up plans after Monitor for signs/symptoms of bleeding  Peggyann Juba, PharmD, BCPS Pager:  [phone] 01/19/2019,9:36 AM

## 2019-01-19 NOTE — Consult Note (Signed)
Reason for Consult: Malignant colonic obstruction Referring Physician: [facility]  [name] HPI: This is a 68 year old male well-known to me from his diagnosis of metastatic rectal cancer 07/2018.  At that time he presented with complaints of hematochezia and then he was found to have a 4 cm long circumferential rectal cancer.  A pediatric colonoscope was needed to pass through the malignant stenosis.  For the past week he suffered with an increase in his abdominal distention and a CT scan in the ER revealed an obstruction in his rectum.  The last round of chemotherapy was approximately one month ago and his CEA levels were declining.  GI was consulted to see if a stent was possible to avoid a loop colostomy.  Past Medical History:  Diagnosis Date  . Cancer [facility])     Past Surgical History:  Procedure Laterality Date  . IR IMAGING GUIDED PORT INSERTION  08/16/2018  . IR US GUIDE BX ASP/DRAIN  08/16/2018    Family History  Problem Relation Age of Onset  . Cancer Mother 47       breast cancer  . Atrial fibrillation Father   . Heart failure Father   . Cancer Maternal Grandmother        lung cancer     Social History:  reports that he has quit smoking. His smoking use included cigarettes and cigars. He quit after 10.00 years of use. He has never used smokeless tobacco. He reports current alcohol use of about 1.0 - 2.0 standard drinks of alcohol per week. He reports that he does not use drugs.  Allergies: No Known Allergies  Medications:  Scheduled:  Continuous: . sodium chloride 125 mL/hr at 01/19/19 1000  . sodium chloride    . lactated ringers 1,000 mL (01/19/19 1223)    Results for orders placed or performed during the hospital encounter of 01/18/19 (from the past 24 hour(s))  POC occult blood, ED Provider will collect     Status: None   Collection Time: 01/18/19  7:13 PM  Result Value Ref Range   Fecal Occult Bld NEGATIVE NEGATIVE  Comprehensive metabolic panel      Status: Abnormal   Collection Time: 01/18/19  7:28 PM  Result Value Ref Range   Sodium 139 135 - 145 mmol/L   Potassium 2.4 (LL) 3.5 - 5.1 mmol/L   Chloride 111 98 - 111 mmol/L   CO2 18 (L) 22 - 32 mmol/L   Glucose, Bld 133 (H) 70 - 99 mg/dL   BUN 18 8 - 23 mg/dL   Creatinine, Ser 0.76 0.61 - 1.24 mg/dL   Calcium 7.5 (L) 8.9 - 10.3 mg/dL   Total Protein 6.7 6.5 - 8.1 g/dL   Albumin 3.1 (L) 3.5 - 5.0 g/dL   AST 25 15 - 41 U/L   ALT 28 0 - 44 U/L   Alkaline Phosphatase 135 (H) 38 - 126 U/L   Total Bilirubin 0.8 0.3 - 1.2 mg/dL   GFR calc non Af Amer >60 >60 mL/min   GFR calc Af Amer >60 >60 mL/min   Anion gap 10 5 - 15  CBC with Differential     Status: Abnormal   Collection Time: 01/18/19  7:28 PM  Result Value Ref Range   WBC 6.7 4.0 - 10.5 K/uL   RBC 2.74 (L) 4.22 - 5.81 MIL/uL   Hemoglobin 9.9 (L) 13.0 - 17.0 g/dL   HCT 31.1 (L) 39.0 - 52.0 %   MCV 113.5 (H) 80.0 -  100.0 fL   MCH 36.1 (H) 26.0 - 34.0 pg   MCHC 31.8 30.0 - 36.0 g/dL   RDW 18.6 (H) 11.5 - 15.5 %   Platelets 180 150 - 400 K/uL   nRBC 0.0 0.0 - 0.2 %   Neutrophils Relative % 79 %   Neutro Abs 5.3 1.7 - 7.7 K/uL   Lymphocytes Relative 13 %   Lymphs Abs 0.8 0.7 - 4.0 K/uL   Monocytes Relative 8 %   Monocytes Absolute 0.6 0.1 - 1.0 K/uL   Eosinophils Relative 0 %   Eosinophils Absolute 0.0 0.0 - 0.5 K/uL   Basophils Relative 0 %   Basophils Absolute 0.0 0.0 - 0.1 K/uL   Immature Granulocytes 0 %   Abs Immature Granulocytes 0.02 0.00 - 0.07 K/uL   Polychromasia PRESENT   Lipase, blood     Status: None   Collection Time: 01/18/19  7:28 PM  Result Value Ref Range   Lipase 27 11 - 51 U/L  Protime-INR     Status: Abnormal   Collection Time: 01/18/19  9:57 PM  Result Value Ref Range   Prothrombin Time 16.3 (H) 11.4 - 15.2 seconds   INR 1.3 (H) 0.8 - 1.2  APTT     Status: Abnormal   Collection Time: 01/18/19  9:57 PM  Result Value Ref Range   aPTT 39 (H) 24 - 36 seconds  Type and screen [place]     Status: None   Collection Time: 01/18/19  9:58 PM  Result Value Ref Range   ABO/RH(D) O POS    Antibody Screen NEG    Sample Expiration      01/21/2019 Performed at [facility], [place] [address], [place], [place] [address]   Heparin level (unfractionated)     Status: Abnormal   Collection Time: 01/18/19 10:03 PM  Result Value Ref Range   Heparin Unfractionated 1.84 (H) 0.30 - 0.70 IU/mL  ABO/Rh     Status: None   Collection Time: 01/18/19 10:10 PM  Result Value Ref Range   ABO/RH(D)      O POS Performed at [facility], [place] [address], [place], [place] [address]   Magnesium     Status: None   Collection Time: 01/19/19  3:41 AM  Result Value Ref Range   Magnesium 2.3 1.7 - 2.4 mg/dL  Basic metabolic panel     Status: Abnormal   Collection Time: 01/19/19  3:41 AM  Result Value Ref Range   Sodium 138 135 - 145 mmol/L   Potassium 3.1 (L) 3.5 - 5.1 mmol/L   Chloride 110 98 - 111 mmol/L   CO2 20 (L) 22 - 32 mmol/L   Glucose, Bld 125 (H) 70 - 99 mg/dL   BUN 17 8 - 23 mg/dL   Creatinine, Ser 0.71 0.61 - 1.24 mg/dL   Calcium 8.1 (L) 8.9 - 10.3 mg/dL   GFR calc non Af Amer >60 >60 mL/min   GFR calc Af Amer >60 >60 mL/min   Anion gap 8 5 - 15  CBC     Status: Abnormal   Collection Time: 01/19/19  3:41 AM  Result Value Ref Range   WBC 6.2 4.0 - 10.5 K/uL   RBC 2.64 (L) 4.22 - 5.81 MIL/uL   Hemoglobin 9.6 (L) 13.0 - 17.0 g/dL   HCT 29.9 (L) 39.0 - 52.0 %   MCV 113.3 (H) 80.0 - 100.0 fL   MCH 36.4 (H) 26.0 -  34.0 pg   MCHC 32.1 30.0 - 36.0 g/dL   RDW 18.3 (H) 11.5 - 15.5 %   Platelets 177 150 - 400 K/uL   nRBC 0.0 0.0 - 0.2 %  Glucose, capillary     Status: Abnormal   Collection Time: 01/19/19  7:32 AM  Result Value Ref Range   Glucose-Capillary 122 (H) 70 - 99 mg/dL  APTT     Status: Abnormal   Collection Time: 01/19/19  8:22 AM  Result Value Ref Range   aPTT 147 (H) 24 - 36 seconds  Heparin level  (unfractionated)     Status: Abnormal   Collection Time: 01/19/19  8:22 AM  Result Value Ref Range   Heparin Unfractionated 1.72 (H) 0.30 - 0.70 IU/mL     Ct Abdomen Pelvis W Contrast  Result Date: 01/18/2019 CLINICAL DATA:  No bowel movement for 1 week, abdominal pain EXAM: CT ABDOMEN AND PELVIS WITH CONTRAST TECHNIQUE: Multidetector CT imaging of the abdomen and pelvis was performed using the standard protocol following bolus administration of intravenous contrast. CONTRAST:  173mL OMNIPAQUE IOHEXOL 300 MG/ML  SOLN COMPARISON:  11/04/2018 FINDINGS: Lower chest: Right middle lobe pulmonary nodules with the largest measuring 10 mm enlarged compared with the prior examination. 5 mm right lower lobe pulmonary nodule. Hepatobiliary: Multiple hypodense hepatic masses with the largest in the right hepatic lobe near the dome of the liver measuring 4.3 x 2.8 cm decreased in size compared with 11/04/2018. No gallstones, gallbladder wall thickening, or biliary dilatation. Pancreas: Unremarkable. No pancreatic ductal dilatation or surrounding inflammatory changes. Spleen: Normal in size without focal abnormality. Adrenals/Urinary Tract: Adrenal glands are unremarkable. Bilateral hypodense, fluid attenuating renal masses consistent with cysts. No urolithiasis or obstructive uropathy. Bladder is unremarkable. Stomach/Bowel: Stomach is normal. Severe colonic distension to the level of the rectum. Colon is fluid-filled. Focal area of rectal wall thickening most consistent with rectal carcinoma with perirectal tumor along the left lateral aspect measuring 1.5 x 1.2 cm. Stable presacral edema. No pneumatosis, pneumoperitoneum or portal venous gas. Vascular/Lymphatic: Abdominal aortic atherosclerosis. Normal caliber abdominal aorta. No lymphadenopathy. Reproductive: Prostate is unremarkable. Other: No fluid collection or hematoma. Fat containing right inguinal hernia. Musculoskeletal: No acute osseous abnormality. No  aggressive osseous lesion. IMPRESSION: 1. Colonic obstruction resulting from rectal carcinoma. 2. Hepatic metastatic disease which is decreased in size compared with the prior examination of 11/04/2018. 3. Multiple right middle lobe and right lower lobe pulmonary nodules most consistent with metastatic disease. Electronically Signed   By: Kathreen Devoid   On: 01/18/2019 21:11    ROS:  As stated above in the HPI otherwise negative.  Blood pressure (!) 157/89, pulse 80, temperature 98.1 F (36.7 C), temperature source Oral, resp. rate 20, height 6\' 1"  (1.854 m), weight 110.2 kg, SpO2 97 %.    PE: Gen: NAD, Alert and Oriented HEENT:  [place]/AT, EOMI Neck: Supple, no LAD Lungs: CTA Bilaterally CV: RRR without M/G/R ABM: Soft, NTND, +BS Ext: No C/C/E  Assessment/Plan: 1) Obstructive rectal cancer. 2) Metastatic rectal cancer. 3) Abnormal CT scan.   A FFS will be attempted to see if a colonic stent can be placed.  Khamari Yousuf D 01/19/2019, 12:53 PM

## 2019-01-20 ENCOUNTER — Telehealth: Payer: Self-pay | Admitting: Hematology

## 2019-01-20 ENCOUNTER — Encounter (HOSPITAL_COMMUNITY): Payer: Self-pay | Admitting: Gastroenterology

## 2019-01-20 LAB — CBC
HCT: 26.6 % — ABNORMAL LOW (ref 39.0–52.0)
Hemoglobin: 8.3 g/dL — ABNORMAL LOW (ref 13.0–17.0)
MCH: 35.9 pg — ABNORMAL HIGH (ref 26.0–34.0)
MCHC: 31.2 g/dL (ref 30.0–36.0)
MCV: 115.2 fL — ABNORMAL HIGH (ref 80.0–100.0)
Platelets: 167 10*3/uL (ref 150–400)
RBC: 2.31 MIL/uL — ABNORMAL LOW (ref 4.22–5.81)
RDW: 18.6 % — ABNORMAL HIGH (ref 11.5–15.5)
WBC: 4.8 10*3/uL (ref 4.0–10.5)
nRBC: 0 % (ref 0.0–0.2)

## 2019-01-20 LAB — BASIC METABOLIC PANEL
Anion gap: 6 (ref 5–15)
BUN: 15 mg/dL (ref 8–23)
CO2: 21 mmol/L — ABNORMAL LOW (ref 22–32)
Calcium: 7.9 mg/dL — ABNORMAL LOW (ref 8.9–10.3)
Chloride: 114 mmol/L — ABNORMAL HIGH (ref 98–111)
Creatinine, Ser: 0.73 mg/dL (ref 0.61–1.24)
GFR calc Af Amer: >60 mL/min (ref >60)
GFR calc non Af Amer: >60 mL/min (ref >60)
Glucose, Bld: 105 mg/dL — ABNORMAL HIGH (ref 70–99)
Potassium: 3.3 mmol/L — ABNORMAL LOW (ref 3.5–5.1)
Sodium: 141 mmol/L (ref 135–145)

## 2019-01-20 LAB — HEPARIN LEVEL (UNFRACTIONATED)
Heparin Unfractionated: 0.99 IU/mL — ABNORMAL HIGH (ref 0.30–0.70)
Heparin Unfractionated: 1.06 IU/mL — ABNORMAL HIGH (ref 0.30–0.70)

## 2019-01-20 LAB — GLUCOSE, CAPILLARY: Glucose-Capillary: 84 mg/dL (ref 70–99)

## 2019-01-20 LAB — APTT
aPTT: 112 seconds — ABNORMAL HIGH (ref 24–36)
aPTT: 116 seconds — ABNORMAL HIGH (ref 24–36)

## 2019-01-20 MED ORDER — HEPARIN (PORCINE) 25000 UT/250ML-% IV SOLN
1000.0000 [IU]/h | INTRAVENOUS | Status: DC
  Administered 2019-01-20: 11:00:00 1000 [IU]/h via INTRAVENOUS
  Filled 2019-01-20: qty 250

## 2019-01-20 MED ORDER — HEPARIN (PORCINE) 25000 UT/250ML-% IV SOLN
1100.0000 [IU]/h | INTRAVENOUS | Status: DC
  Administered 2019-01-20: 01:00:00 1100 [IU]/h via INTRAVENOUS
  Filled 2019-01-20: qty 250

## 2019-01-20 MED ORDER — POTASSIUM CHLORIDE 10 MEQ/100ML IV SOLN
10.0000 meq | INTRAVENOUS | Status: AC
  Administered 2019-01-20 (×4): 10 meq via INTRAVENOUS
  Filled 2019-01-20 (×4): qty 100

## 2019-01-20 MED ORDER — HEPARIN SOD (PORK) LOCK FLUSH 100 UNIT/ML IV SOLN
500.0000 [IU] | INTRAVENOUS | Status: AC | PRN
  Administered 2019-01-20: 15:00:00 500 [IU]

## 2019-01-20 NOTE — Telephone Encounter (Signed)
Scheduled appt per 4/17 sch message - pt wife is aware of appt date and time

## 2019-01-20 NOTE — Progress Notes (Signed)
ANTICOAGULATION CONSULT NOTE - Follow Up Consult  Pharmacy Consult for IV heparin Indication: pulmonary embolus on PTA xarelto, pending surgery  No Known Allergies  Patient Measurements: Height: 6\' 1"  (185.4 cm) Weight: 242 lb 15.2 oz (110.2 kg) IBW/kg (Calculated) : 79.9 Heparin Dosing Weight: 89 kg  Vital Signs: Temp: 98.4 F (36.9 C) (04/16 2010) Temp Source: Oral (04/16 2010) BP: 142/94 (04/16 2010) Pulse Rate: 80 (04/16 2010)  Labs: Recent Labs    01/18/19 1928 01/18/19 2157 01/18/19 2203 01/19/19 0341 01/19/19 0822 01/20/19 0005  HGB 9.9*  --   --  9.6*  --   --   HCT 31.1*  --   --  29.9*  --   --   PLT 180  --   --  177  --   --   APTT  --  39*  --   --  147* 116*  LABPROT  --  16.3*  --   --   --   --   INR  --  1.3*  --   --   --   --   HEPARINUNFRC  --   --  1.84*  --  1.72* 1.06*  CREATININE 0.76  --   --  0.71  --   --     Estimated Creatinine Clearance: 119.8 mL/min (by C-G formula based on SCr of 0.71 mg/dL).   Medical History: Past Medical History:  Diagnosis Date  . Cancer [facility])     Medications:  Scheduled:   Infusions:  . sodium chloride 125 mL/hr at 01/19/19 2157  . heparin 1,200 Units/hr (01/19/19 2228)  . lactated ringers 1,000 mL (01/19/19 1223)    Assessment: [name] with [name] who presents with bowel obstruction. Surgery consulted, plan for flex sig 4/16. Patient is on chronic xarelto for hx of PE which is now on hold and pharmacy is consulted to dose IV heparin.  Today, 4/17  APTT supratherapeutic (116) and HL = 1.06 on heparin 1200 units/hr - using aPTT to monitor heparin since heparin levels are elevated due to residual effects of Xarelto.  No infusion or bleeding issues per RN.   CBC: Hgb 9.6 consistent with previous values, Plts wnl  Per d/w Dr. Benson Mendoza ok to resume heparin drip post procedure  Goal of Therapy:  Heparin level 0.3-0.7 units/ml aPTT 66-102 seconds Monitor platelets by anticoagulation  protocol: Yes   Plan:  Decrease heparin drip to 1100 units/hr Recheck HL in 6 hours Monitor for signs/symptoms of bleeding  Billy Mendoza 01/20/2019, 12:46 AM

## 2019-01-20 NOTE — Progress Notes (Signed)
ANTICOAGULATION CONSULT NOTE - Follow Up Consult  Pharmacy Consult for IV heparin Indication: pulmonary embolus on PTA xarelto, pending surgery  No Known Allergies  Patient Measurements: Height: 6\' 1"  (185.4 cm) Weight: 242 lb 15.2 oz (110.2 kg) IBW/kg (Calculated) : 79.9 Heparin Dosing Weight: 89 kg  Vital Signs: Temp: 98.9 F (37.2 C) (04/17 0551) Temp Source: Oral (04/17 0551) BP: 135/86 (04/17 0551) Pulse Rate: 72 (04/17 0551)  Labs: Recent Labs    01/18/19 1928  01/18/19 2157  01/19/19 0341 01/19/19 0822 01/20/19 0005 01/20/19 0345 01/20/19 0829  HGB 9.9*  --   --   --  9.6*  --   --  8.3*  --   HCT 31.1*  --   --   --  29.9*  --   --  26.6*  --   PLT 180  --   --   --  177  --   --  167  --   APTT  --    < > 39*  --   --  147* 116*  --  112*  LABPROT  --   --  16.3*  --   --   --   --   --   --   INR  --   --  1.3*  --   --   --   --   --   --   HEPARINUNFRC  --   --   --    < >  --  1.72* 1.06*  --  0.99*  CREATININE 0.76  --   --   --  0.71  --   --  0.73  --    < > = values in this interval not displayed.    Estimated Creatinine Clearance: 119.8 mL/min (by C-G formula based on SCr of 0.73 mg/dL).   Medical History: Past Medical History:  Diagnosis Date  . Cancer ([place])     Medications:  Scheduled:   Infusions:  . sodium chloride 125 mL/hr at 01/20/19 0546  . heparin 1,100 Units/hr (01/20/19 0109)  . lactated ringers 1,000 mL (01/19/19 1223)  . potassium chloride 10 mEq (01/20/19 0944)    Assessment: 54 yoM with [name] who presents with bowel obstruction. Surgery consulted, plan for flex sig 4/16. Patient is on chronic xarelto for hx of PE which is now on hold and pharmacy is consulted to dose IV heparin.  Today, 4/17  APTT supratherapeutic (112) and HL = 0.99 on heparin 1100 units/hr - using aPTT to monitor heparin since heparin levels are elevated due to residual effects of Xarelto.  No infusion or bleeding issues per RN.    CBC: Hgb 8.3 slightly decreased, Plts wnl  Plan discharge today back on xarelto if tolerates meal  Goal of Therapy:  Heparin level 0.3-0.7 units/ml aPTT 66-102 seconds Monitor platelets by anticoagulation protocol: Yes   Plan:  Decrease heparin drip to 1000 units/hr Recheck HL in 6 hours if patient not discharged Monitor for signs/symptoms of bleeding  Peggyann Juba, PharmD, BCPS 01/20/2019, 10:53 AM

## 2019-01-20 NOTE — Progress Notes (Signed)
Subjective: Feeling better.  He is able to have bowel movements.  ABM is not distended.  Objective: Vital signs in last 24 hours: Temp:  [97.6 F (36.4 C)-98.9 F (37.2 C)] 98.9 F (37.2 C) (04/17 0551) Pulse Rate:  [72-88] 72 (04/17 0551) Resp:  [15-20] 20 (04/17 0551) BP: (135-170)/(72-110) 135/86 (04/17 0551) SpO2:  [94 %-100 %] 95 % (04/17 0551) Weight:  [110.2 kg] 110.2 kg (04/16 1213) Last BM Date: 01/20/19  Intake/Output from previous day: 04/16 0701 - 04/17 0700 In: 4525.6 [P.O.:60; I.V.:4165.6; IV Piggyback:300] Out: 800 [Urine:300; Stool:500] Intake/Output this shift: No intake/output data recorded.  General appearance: alert and no distress GI: obese, soft, nontender, nondistended  Lab Results: Recent Labs    01/18/19 1928 01/19/19 0341 01/20/19 0345  WBC 6.7 6.2 4.8  HGB 9.9* 9.6* 8.3*  HCT 31.1* 29.9* 26.6*  PLT 180 177 167   BMET Recent Labs    01/18/19 1928 01/19/19 0341 01/20/19 0345  NA 139 138 141  K 2.4* 3.1* 3.3*  CL 111 110 114*  CO2 18* 20* 21*  GLUCOSE 133* 125* 105*  BUN 18 17 15   CREATININE 0.76 0.71 0.73  CALCIUM 7.5* 8.1* 7.9*   LFT Recent Labs    01/18/19 1928  PROT 6.7  ALBUMIN 3.1*  AST 25  ALT 28  ALKPHOS 135*  BILITOT 0.8   PT/INR Recent Labs    01/18/19 2157  LABPROT 16.3*  INR 1.3*   Hepatitis Panel No results for input(s): HEPBSAG, HCVAB, HEPAIGM, HEPBIGM in the last 72 hours. C-Diff No results for input(s): CDIFFTOX in the last 72 hours. Fecal Lactopherrin No results for input(s): FECLLACTOFRN in the last 72 hours.  Studies/Results: Dg Abd 1 View - Kub  Result Date: 01/19/2019 CLINICAL DATA:  Colonic obstruction EXAM: ABDOMEN - 1 VIEW COMPARISON:  01/18/2019 FINDINGS: Imaging confirms placement of a colonoscope across the obstruction in the rectum. Subsequently, a stent has been deployed across the area of narrowing. IMPRESSION: Successful metallic stent placement across an area of rectal narrowing.  Electronically Signed   By: Marybelle Killings M.D.   On: 01/19/2019 14:14   Ct Abdomen Pelvis W Contrast  Result Date: 01/18/2019 CLINICAL DATA:  No bowel movement for 1 week, abdominal pain EXAM: CT ABDOMEN AND PELVIS WITH CONTRAST TECHNIQUE: Multidetector CT imaging of the abdomen and pelvis was performed using the standard protocol following bolus administration of intravenous contrast. CONTRAST:  124mL OMNIPAQUE IOHEXOL 300 MG/ML  SOLN COMPARISON:  11/04/2018 FINDINGS: Lower chest: Right middle lobe pulmonary nodules with the largest measuring 10 mm enlarged compared with the prior examination. 5 mm right lower lobe pulmonary nodule. Hepatobiliary: Multiple hypodense hepatic masses with the largest in the right hepatic lobe near the dome of the liver measuring 4.3 x 2.8 cm decreased in size compared with 11/04/2018. No gallstones, gallbladder wall thickening, or biliary dilatation. Pancreas: Unremarkable. No pancreatic ductal dilatation or surrounding inflammatory changes. Spleen: Normal in size without focal abnormality. Adrenals/Urinary Tract: Adrenal glands are unremarkable. Bilateral hypodense, fluid attenuating renal masses consistent with cysts. No urolithiasis or obstructive uropathy. Bladder is unremarkable. Stomach/Bowel: Stomach is normal. Severe colonic distension to the level of the rectum. Colon is fluid-filled. Focal area of rectal wall thickening most consistent with rectal carcinoma with perirectal tumor along the left lateral aspect measuring 1.5 x 1.2 cm. Stable presacral edema. No pneumatosis, pneumoperitoneum or portal venous gas. Vascular/Lymphatic: Abdominal aortic atherosclerosis. Normal caliber abdominal aorta. No lymphadenopathy. Reproductive: Prostate is unremarkable. Other: No fluid collection or  hematoma. Fat containing right inguinal hernia. Musculoskeletal: No acute osseous abnormality. No aggressive osseous lesion. IMPRESSION: 1. Colonic obstruction resulting from rectal carcinoma.  2. Hepatic metastatic disease which is decreased in size compared with the prior examination of 11/04/2018. 3. Multiple right middle lobe and right lower lobe pulmonary nodules most consistent with metastatic disease. Electronically Signed   By: Kathreen Devoid   On: 01/18/2019 21:11   Dg C-arm 1-60 Min-no Report  Result Date: 01/19/2019 Fluoroscopy was utilized by the requesting physician.  No radiographic interpretation.    Medications:  Scheduled:  Continuous: . sodium chloride 125 mL/hr at 01/20/19 0546  . heparin 1,100 Units/hr (01/20/19 0109)  . lactated ringers 1,000 mL (01/19/19 1223)  . potassium chloride      Assessment/Plan: 1) Rectal cancer obstruction s/p stenting.   The patient feels well.  He is able to pass flatus and bowel movements without any difficulty.  The stent is in good position.    Plan: 1) Advance to a regular diet. 2) Okay to D/C home from the GI standpoint. 3) Signing off.  LOS: 2 days   Joylene Wescott D 01/20/2019, 7:17 AM

## 2019-01-20 NOTE — Discharge Summary (Signed)
Physician Discharge Summary  Billy Mendoza OHY:[id number] DOB: [birth year] DOA: 01/18/2019  PCP: Merry Proud, MD  Admit date: 01/18/2019 Discharge date: 01/20/2019  Time spent: 45 minutes  Recommendations for Outpatient Follow-up:  Patient will be discharged to home.  Patient will need to follow up with primary care provider within one week of discharge Pete BMP and CBC. Follow up with Dr. Burr Medico, oncology. Patient should continue medications as prescribed.  Patient should follow a regular diet.   Discharge Diagnoses:  Principal Problem:   Large bowel obstruction ([place]) Active Problems: History of pulmonary embolism Rectal cancer with metastasis to liver and lung Iron deficiency anemia Hypokalemia  Discharge Condition: Stable  Diet recommendation: regular  Filed Weights   01/18/19 2200 01/18/19 2305 01/19/19 1213  Weight: 110.2 kg 110.2 kg 110.2 kg    History of present illness:  On 01/18/2019 by Dr. Gaspar Garbe a 68 y.o.malewith medical history significant ofrectal colon cancer metastasized to liver and lung on chemotherapy, hypertension, iron deficiency anemia, PE on Xarelto, who presents with nausea, vomiting, abdominal pain and constipation.  Patient states thathehas been constipated in the past 7 days. He has not had bowel movement for 7 days. He developed nausea,vomiting and abdominal pain. He vomited twice with non-biliary nonbloody vomitus today. Her abdominal pain is located mainly in lower abdomen, cramping-like, intermittent, 8 out of 10 severity, nonradiating. Patient states that she still passing gas intermittently. No diarrhea. He tried to usemagnesium citrate and MiraLAX without significant help.Denies fever or chills. Patient does not have chest pain, shortness breath, cough, fever or chills. Denies symptoms of UTI or unilateral weakness. No sick contact to COVID-19 personnel.  Hospital Course:  Large bowel obstruction -Presented  with abdominal pain, nausea and vomiting along with constipation -CT and pelvis showed colonic obstruction resulting from rectal carcinoma.   -was placed on IV fluids, antiemetics, pain control -General surgery consulted and appreciated.   -Gastroenterology consulted and appreciated, status post flexible sigmoidoscopy with stent placement -Patient with no current abdominal pain, nausea or vomiting.  Did have several bowel movements. -Tolerating diet  History of pulmonary embolism -Currently no complaints of chest pain or shortness of breath -Resume Xarelto on discharge  Rectal cancer with metastasis to liver and lung -Patient follows with oncology, Dr. Burr Medico -Currently on chemotherapy, last dose approximately 3 weeks ago -CT abd/pelvis: Hepatic metastatic disease which is decreased in size compared to prior examination 11/04/2018.  Multiple right middle lobe and right lower lobe pulmonary nodules.  Iron deficiency anemia -Light drop in hemoglobin, 8.3.  Suspect due to IV fluids/dilutional component -FOBT negative -Repeat CBC in 1 week  Hypokalemia -Replacing, repeat BMP in 1 week -Magnesium 2.3  Procedures: Flexible sigmoidoscopy with endoscopic stent placement  Consultations: General surgery Gastroenterology  Discharge Exam: Vitals:   01/20/19 0158 01/20/19 0551  BP: 138/89 135/86  Pulse: 81 72  Resp: 18 20  Temp: 98.6 F (37 C) 98.9 F (37.2 C)  SpO2: 96% 95%   Patient feeling much better today.  Was able to have several bowel movements.  Denies current abdominal pain, nausea or vomiting, chest pain, shortness of breath, dizziness or headache.  Ready to go home.   General: Well developed, well nourished, NAD, appears stated age  HEENT: NCAT,  mucous membranes moist.  Cardiovascular: S1 S2 auscultated, RRR, no murmur  Respiratory: Clear to auscultation bilaterally with equal chest rise, + umbilical hernia  Abdomen: Soft, nontender, nondistended, + bowel  sounds  Extremities: warm dry without cyanosis clubbing or  edema  Neuro: AAOx3, nonfocal  Psych: Pleasant, appropriate mood and affect  Discharge Instructions Discharge Instructions    Discharge instructions   Complete by:  As directed    Patient will be discharged to home.  Patient will need to follow up with primary care provider within one week of discharge Pete BMP and CBC. Follow up with Dr. Burr Medico, oncology. Patient should continue medications as prescribed.  Patient should follow a regular diet.     Allergies as of 01/20/2019   No Known Allergies     Medication List    TAKE these medications   acetaminophen 325 MG tablet Commonly known as:  TYLENOL Take 650 mg by mouth every 6 (six) hours as needed for moderate pain.   amLODipine 5 MG tablet Commonly known as:  NORVASC Take 1 tablet (5 mg total) by mouth 2 (two) times daily.   diphenoxylate-atropine 2.5-0.025 MG tablet Commonly known as:  LOMOTIL Take 1-2 tablets by mouth 4 (four) times daily as needed for diarrhea or loose stools. Up to 8 tabs daily for severe diarrhea   potassium chloride SA 20 MEQ tablet Commonly known as:  K-DUR TAKE 1 TABLET(20 MEQ) BY MOUTH DAILY   rivaroxaban 20 MG Tabs tablet Commonly known as:  Xarelto Take 1 tablet (20 mg total) by mouth daily with supper.   VITAMIN E PO Take 1 tablet by mouth daily.      No Known Allergies Follow-up Information    Primary care physician. Schedule an appointment as soon as possible for a visit in 1 week(s).   Why:  Hospital follow up       Truitt Merle, MD. Schedule an appointment as soon as possible for a visit in 1 week(s).   Specialties:  Hematology, Oncology Why:  Hospital follow up Contact information: [place] [address] [phone]            The results of significant diagnostics from this hospitalization (including imaging, microbiology, ancillary and laboratory) are listed below for reference.     Significant Diagnostic Studies: Dg Abd 1 View - Kub  Result Date: 01/19/2019 CLINICAL DATA:  Colonic obstruction EXAM: ABDOMEN - 1 VIEW COMPARISON:  01/18/2019 FINDINGS: Imaging confirms placement of a colonoscope across the obstruction in the rectum. Subsequently, a stent has been deployed across the area of narrowing. IMPRESSION: Successful metallic stent placement across an area of rectal narrowing. Electronically Signed   By: Marybelle Killings M.D.   On: 01/19/2019 14:14   Ct Abdomen Pelvis W Contrast  Result Date: 01/18/2019 CLINICAL DATA:  No bowel movement for 1 week, abdominal pain EXAM: CT ABDOMEN AND PELVIS WITH CONTRAST TECHNIQUE: Multidetector CT imaging of the abdomen and pelvis was performed using the standard protocol following bolus administration of intravenous contrast. CONTRAST:  19mL OMNIPAQUE IOHEXOL 300 MG/ML  SOLN COMPARISON:  11/04/2018 FINDINGS: Lower chest: Right middle lobe pulmonary nodules with the largest measuring 10 mm enlarged compared with the prior examination. 5 mm right lower lobe pulmonary nodule. Hepatobiliary: Multiple hypodense hepatic masses with the largest in the right hepatic lobe near the dome of the liver measuring 4.3 x 2.8 cm decreased in size compared with 11/04/2018. No gallstones, gallbladder wall thickening, or biliary dilatation. Pancreas: Unremarkable. No pancreatic ductal dilatation or surrounding inflammatory changes. Spleen: Normal in size without focal abnormality. Adrenals/Urinary Tract: Adrenal glands are unremarkable. Bilateral hypodense, fluid attenuating renal masses consistent with cysts. No urolithiasis or obstructive uropathy. Bladder is unremarkable. Stomach/Bowel: Stomach is normal. Severe colonic  distension to the level of the rectum. Colon is fluid-filled. Focal area of rectal wall thickening most consistent with rectal carcinoma with perirectal tumor along the left lateral aspect measuring 1.5 x 1.2 cm. Stable presacral edema. No  pneumatosis, pneumoperitoneum or portal venous gas. Vascular/Lymphatic: Abdominal aortic atherosclerosis. Normal caliber abdominal aorta. No lymphadenopathy. Reproductive: Prostate is unremarkable. Other: No fluid collection or hematoma. Fat containing right inguinal hernia. Musculoskeletal: No acute osseous abnormality. No aggressive osseous lesion. IMPRESSION: 1. Colonic obstruction resulting from rectal carcinoma. 2. Hepatic metastatic disease which is decreased in size compared with the prior examination of 11/04/2018. 3. Multiple right middle lobe and right lower lobe pulmonary nodules most consistent with metastatic disease. Electronically Signed   By: Kathreen Devoid   On: 01/18/2019 21:11   Dg C-arm 1-60 Min-no Report  Result Date: 01/19/2019 Fluoroscopy was utilized by the requesting physician.  No radiographic interpretation.    Microbiology: No results found for this or any previous visit (from the past 240 hour(s)).   Labs: Basic Metabolic Panel: Recent Labs  Lab 01/18/19 1928 01/19/19 0341 01/20/19 0345  NA 139 138 141  K 2.4* 3.1* 3.3*  CL 111 110 114*  CO2 18* 20* 21*  GLUCOSE 133* 125* 105*  BUN 18 17 15   CREATININE 0.76 0.71 0.73  CALCIUM 7.5* 8.1* 7.9*  MG  --  2.3  --    Liver Function Tests: Recent Labs  Lab 01/18/19 1928  AST 25  ALT 28  ALKPHOS 135*  BILITOT 0.8  PROT 6.7  ALBUMIN 3.1*   Recent Labs  Lab 01/18/19 1928  LIPASE 27   No results for input(s): AMMONIA in the last 168 hours. CBC: Recent Labs  Lab 01/18/19 1928 01/19/19 0341 01/20/19 0345  WBC 6.7 6.2 4.8  NEUTROABS 5.3  --   --   HGB 9.9* 9.6* 8.3*  HCT 31.1* 29.9* 26.6*  MCV 113.5* 113.3* 115.2*  PLT 180 177 167   Cardiac Enzymes: No results for input(s): CKTOTAL, CKMB, CKMBINDEX, TROPONINI in the last 168 hours. BNP: BNP (last 3 results) No results for input(s): BNP in the last 8760 hours.  ProBNP (last 3 results) No results for input(s): PROBNP in the last 8760 hours.   CBG: Recent Labs  Lab 01/19/19 0732 01/20/19 0723  GLUCAP 122* 84       Signed:  Lauren Aguayo  [facility] 01/20/2019, 9:28 AM

## 2019-01-20 NOTE — Progress Notes (Signed)
Patient given AVS, all questions answered, patient walked down with nurse to vehicle for discharge.   Norlene Duel RN, BSN

## 2019-01-20 NOTE — Discharge Instructions (Signed)
Bowel Obstruction °A bowel obstruction means that something is blocking the small or large bowel. The bowel is also called the intestine. It is the long tube that connects the stomach to the opening of the butt (anus). When something blocks the bowel, food and fluids cannot pass through like normal. This condition needs to be treated. Treatment depends on the cause of the problem and how bad the problem is. °What are the causes? °Common causes of this condition include: °· Scar tissue (adhesions) from past surgery or from high-energy X-rays (radiation). °· Recent surgery in the belly. This affects how food moves in the bowel. °· Some diseases, such as: °? Irritation of the lining of the digestive tract (Crohn's disease). °? Irritation of small pouches in the bowel (diverticulitis). °· Growths or tumors. °· A bulging organ (hernia). °· Twisting of the bowel (volvulus). °· A foreign body. °· Slipping of a part of the bowel into another part (intussusception). °What are the signs or symptoms? °Symptoms of this condition include: °· Pain in the belly. °· Feeling sick to your stomach (nauseous). °· Throwing up (vomiting). °· Bloating in the belly. °· Being unable to pass gas. °· Trouble pooping (constipation). °· Watery poop (diarrhea). °· A lot of belching. °How is this diagnosed? °This condition may be diagnosed based on: °· A physical exam. °· Medical history. °· Imaging tests, such as X-ray or CT scan. °· Blood tests. °· Urine tests. °How is this treated? °Treatment for this condition may include: °· Fluids and pain medicines that are given through an IV tube. Your doctor may tell you not to eat or drink if you feel sick to your stomach and are throwing up. °· Eating a clear liquid diet for a few days. °· Putting a small tube (nasogastric tube) into the stomach. This will help with pain, discomfort, and nausea by removing blocked air and fluids from the stomach. °· Surgery. This may be needed if other treatments do  not work. °Follow these instructions at home: °Medicines °· Take over-the-counter and prescription medicines only as told by your doctor. °· If you were prescribed an antibiotic medicine, take it as told by your doctor. Do not stop taking the antibiotic even if you start to feel better. °General instructions °· Follow your diet as told by your doctor. You may need to: °? Only drink clear liquids until you start to get better. °? Avoid solid foods. °· Return to your normal activities as told by your doctor. Ask your doctor what activities are safe for you. °· Do not sit for a long time without moving. Get up to take short walks every 1-2 hours. This is important. Ask for help if you feel weak or unsteady. °· Keep all follow-up visits as told by your doctor. This is important. °How is this prevented? °After having a bowel obstruction, you may be more likely to have another. You can do some things to stop it from happening again. °· If you have a long-term (chronic) disease, contact your doctor if you see changes or problems. °· Take steps to prevent or treat trouble pooping. Your doctor may ask that you: °? Drink enough fluid to keep your pee (urine) pale yellow. °? Take over-the-counter or prescription medicines. °? Eat foods that are high in fiber. These include beans, whole grains, and fresh fruits and vegetables. °? Limit foods that are high in fat and sugar. These include fried or sweet foods. °· Stay active. Ask your doctor which exercises are   safe for you. °· Avoid stress. °· Eat three small meals and three small snacks each day. °· Work with a food expert (dietitian) to make a meal plan that works for you. °· Do not use any products that contain nicotine or tobacco, such as cigarettes and e-cigarettes. If you need help quitting, ask your doctor. °Contact a doctor if: °· You have a fever. °· You have chills. °Get help right away if: °· You have pain or cramps that get worse. °· You throw up blood. °· You are  sick to your stomach. °· You cannot stop throwing up. °· You cannot drink fluids. °· You feel mixed up (confused). °· You feel very thirsty (dehydrated). °· Your belly gets more bloated. °· You feel weak or you pass out (faint). °Summary °· A bowel obstruction means that something is blocking the small or large bowel. °· Treatment may include IV fluids and pain medicine. You may also have a clear liquid diet, a small tube in your stomach, or surgery. °· Drink clear liquids and avoid solid foods until you get better. °This information is not intended to replace advice given to you by your health care provider. Make sure you discuss any questions you have with your health care provider. °Document Released: 10/29/2004 Document Revised: 02/02/2018 Document Reviewed: 02/02/2018 °[employer] Interactive Patient Education © 2019 [employer] ° °

## 2019-01-21 ENCOUNTER — Inpatient Hospital Stay: Payer: BLUE CROSS/BLUE SHIELD

## 2019-01-23 ENCOUNTER — Inpatient Hospital Stay (HOSPITAL_BASED_OUTPATIENT_CLINIC_OR_DEPARTMENT_OTHER): Payer: BLUE CROSS/BLUE SHIELD | Admitting: Hematology

## 2019-01-23 DIAGNOSIS — C20 Malignant neoplasm of rectum: Secondary | ICD-10-CM | POA: Diagnosis not present

## 2019-01-23 DIAGNOSIS — Z86711 Personal history of pulmonary embolism: Secondary | ICD-10-CM

## 2019-01-23 DIAGNOSIS — C787 Secondary malignant neoplasm of liver and intrahepatic bile duct: Secondary | ICD-10-CM

## 2019-01-23 DIAGNOSIS — D61810 Antineoplastic chemotherapy induced pancytopenia: Secondary | ICD-10-CM

## 2019-01-23 DIAGNOSIS — Z7901 Long term (current) use of anticoagulants: Secondary | ICD-10-CM | POA: Diagnosis not present

## 2019-01-23 DIAGNOSIS — I10 Essential (primary) hypertension: Secondary | ICD-10-CM

## 2019-01-23 DIAGNOSIS — Z79899 Other long term (current) drug therapy: Secondary | ICD-10-CM

## 2019-01-23 MED ORDER — ENCORAFENIB 75 MG PO CAPS: 300.0000 mg | ORAL_CAPSULE | Freq: Every day | ORAL | 3 refills | Status: DC

## 2019-01-23 MED ORDER — BINIMETINIB 15 MG PO TABS: 45.0000 mg | ORAL_TABLET | Freq: Two times a day (BID) | ORAL | 3 refills | Status: DC

## 2019-01-23 NOTE — Progress Notes (Signed)
[place]   [phone] [phone]   Clinic Follow up Note   Patient Care Team: Merry Proud, MD as PCP - General (Internal Medicine) Truitt Merle, MD as Consulting Physician (Hematology) Carol Ada, MD as Consulting Physician (Gastroenterology)   I connected with [name] on 01/23/2019 at  1:30 PM EDT by telephone visit and verified that I am speaking with the correct person using two identifiers.   I discussed the limitations, risks, security and privacy concerns of performing an evaluation and management service by telephone and the availability of in person appointments. I also discussed with the patient that there may be a patient responsible charge related to this service. The patient expressed understanding and agreed to proceed.   Patient's location:  His home  Provider's location:  My Office  CHIEF COMPLAINT: F/u of rectal cancer metastasized to liver  SUMMARY OF ONCOLOGIC HISTORY: Oncology History   Cancer Staging Rectal cancer metastasized to liver [facility]) Staging form: Colon and Rectum, AJCC 8th Edition - Clinical stage from 07/28/2018: Stage IVB (cTX, cNX, pM1b) - Signed by Truitt Merle, MD on 08/18/2018       Rectal cancer metastasized to liver ([facility])   07/28/2018 Initial Biopsy    Biopsy 07/28/18 Final Microscopic Diagnosis A.Colon, Ascending, Polyp:  -Fragments of tubular adenoma -No high-grade dysplasia of malignancy.  B. Rectum, Mass, Polyp:  -Invasive adenocarcinoma, moderately - poorly differentiatred.     07/28/2018 Cancer Staging    Staging form: Colon and Rectum, AJCC 8th Edition - Clinical stage from 07/28/2018: Stage IVB (cTX, cNX, pM1b) - Signed by Truitt Merle, MD on 08/18/2018    08/04/2018 Initial Diagnosis    Rectal cancer metastasized to liver ([place])    08/16/2018 Pathology Results    Biospy  Diagnosis 08/16/18 Liver, needle/core biopsy - ADENOCARCINOMA. Microscopic Comment By morphology, the  findings are compatible with the provided clinical history of colorectal adenocarcinoma. Results reported to [name] on 08/18/2018. Intradepartmental consultation was obtained ([name]).     08/16/2018 Miscellaneous    Foundation 1 genomic testing: MS-stable Tumor mutation burden  3/Mb K-ras/NRAS wild-type BRAF V600 E KDMSA R266Q PTEN T335f* SMAD4 loss TP53 loss      08/18/2018 Procedure    Colonoscopy by Dr. HBenson Norwayshowed malignant appearing partially obstructing tumor in the rectum, 8 cm above anal verge.    08/18/2018 - 09/07/2018 Chemotherapy    CAPOX q3weeks with Xeloda 20074mBID 2 weeks on, 1 weeks off starting 08/18/18.  Swithced treatment due to BRAF mutation.     09/07/2018 - 12/22/2018 Chemotherapy    FOLFOXIRI and avastin every 2 weeks starting 09/07/18. Oxaliplatin held starting with cycle 8 and continue with FOLFIRI and Avastin. D/c due to disease progression.     11/04/2018 Imaging    CT CAP 11/04/18 IMPRESSION: 1. Peripheral filling defect in the right main pulmonary artery extending primarily into the right lower lobe pulmonary artery. While the peripheral location indicates some degree of chronicity of this pulmonary embolus, this is a new finding compared to 07/28/2018. The right ventricular to left ventricular ratio is 0.7, which does not specifically indicate right heart strain. 2. The malignancy appears considerably improved, with reduced size pulmonary nodules; reduced size and increased internal necrosis of the hepatic metastatic lesions; marked improvement in the thoracic adenopathy; reduced conspicuity of the rectal tumor; and significant reduction in perirectal tumor burden/adenopathy. 3. Other imaging findings of potential clinical significance: Aortic Atherosclerosis (ICD10-I70.0). Coronary atherosclerosis. Bilateral renal cysts. Small umbilical hernia  contains adipose tissue.  Critical Value/emergent results were called by telephone at the  time of interpretation on 11/04/2018 at 3:05 pm to [name] , who verbally acknowledged these results.    01/18/2019 Imaging    CT AP W contrast  IMPRESSION: 1. Colonic obstruction resulting from rectal carcinoma. 2. Hepatic metastatic disease which is decreased in size compared with the prior examination of 11/04/2018. 3. Multiple right middle lobe and right lower lobe pulmonary nodules most consistent with metastatic disease.     Antibody Plan    PENDING BRAFTOVI(encorafenib) BID and MEKTOVI(binimetinib) once daily and Vectibix q2weeks starting next week.       CURRENT THERAPY:  PENDING second line BRAFTOVI(encorafenib) 334m daily and MEKTOVI(binimetinib), 463mq12h and Vectibix q2weeks starting next week.   INTERVAL HISTORY:  KeShelley Pooleys here for a follow up of post hospitalization. He was able to identify himself by birth date.   He was hospitalized on 01/18/19 for bowel obstruction due to rectal cancer. He had stent placed on 01/19/19 by Dr. HuBenson NorwaySince discharge he is almost back to baseline. He may return to work in 2 days..  He has been able to have a complete BM last night. He is not taking stool softener or laxative. He does note watery stool.  He notes his wife would be interested with more questions but she works in the hospital on and off.    REVIEW OF SYSTEMS:   Constitutional: Denies fevers, chills or abnormal weight loss (+) energy improved  Eyes: Denies blurriness of vision Ears, nose, mouth, throat, and face: Denies mucositis or sore throat Respiratory: Denies cough, dyspnea or wheezes Cardiovascular: Denies palpitation, chest  (+) watery stool  Gastrointestinal:  Denies nausea, heartburn or change in bowel habits Skin: Denies abnormal skin rashes Lymphatics: Denies new lymphadenopathy or easy bruising Neurological:Denies numbness, tingling or new weaknesses Behavioral/Psych: Mood is stable, no new changes  All other systems were reviewed with the  patient and are negative.  MEDICAL HISTORY:  Past Medical History:  [facility]    SURGICAL HISTORY: Past Surgical History:  Procedure Laterality Date  . COLONIC STENT PLACEMENT N/A 01/19/2019   Procedure: COLONIC STENT PLACEMENT;  Surgeon: HuCarol AdaMD;  Location: [facility];  Service: Endoscopy;  Laterality: N/A;  . FLEXIBLE SIGMOIDOSCOPY N/A 01/19/2019   Procedure: FLEXIBLE SIGMOIDOSCOPY;  Surgeon: HuCarol AdaMD;  Location: [facility];  Service: Endoscopy;  Laterality: N/A;  . IR IMAGING GUIDED PORT INSERTION  08/16/2018  . IR [place] BX ASP/DRAIN  08/16/2018    I have reviewed the social history and family history with the patient and they are unchanged from previous note.  ALLERGIES:  has No Known Allergies.  MEDICATIONS:  Current Outpatient Medications  Medication Sig Dispense Refill  . acetaminophen (TYLENOL) 325 MG tablet Take 650 mg by mouth every 6 (six) hours as needed for moderate pain.    . Marland KitchenmLODipine (NORVASC) 5 MG tablet Take 1 tablet (5 mg total) by mouth 2 (two) times daily. 60 tablet 2  . diphenoxylate-atropine (LOMOTIL) 2.5-0.025 MG tablet Take 1-2 tablets by mouth 4 (four) times daily as needed for diarrhea or loose stools. Up to 8 tabs daily for severe diarrhea 60 tablet 2  . potassium chloride SA (K-DUR,KLOR-CON) 20 MEQ tablet TAKE 1 TABLET(20 MEQ) BY MOUTH DAILY 30 tablet 1  . rivaroxaban (XARELTO) 20 MG TABS tablet Take 1 tablet (20 mg total) by mouth daily with supper. 30 tablet 5  .  VITAMIN E PO Take 1 tablet by mouth daily.     No current facility-administered medications for this visit.     PHYSICAL EXAMINATION: ECOG PERFORMANCE STATUS: 1 - Symptomatic but completely ambulatory  No vitals taken today, Exam not performed today   LABORATORY DATA:  I have reviewed the data as listed CBC Latest Ref Rng & Units 01/20/2019 01/19/2019 01/18/2019  WBC 4.0 - 10.5 K/uL 4.8 6.2 6.7  Hemoglobin 13.0 - 17.0 g/dL 8.3(L) 9.6(L)  9.9(L)  Hematocrit 39.0 - 52.0 % 26.6(L) 29.9(L) 31.1(L)  Platelets 150 - 400 K/uL 167 177 180     CMP Latest Ref Rng & Units 01/20/2019 01/19/2019 01/18/2019  Glucose 70 - 99 mg/dL 105(H) 125(H) 133(H)  BUN 8 - 23 mg/dL 15 17 18   Creatinine 0.61 - 1.24 mg/dL 0.73 0.71 0.76  Sodium 135 - 145 mmol/L 141 138 139  Potassium 3.5 - 5.1 mmol/L 3.3(L) 3.1(L) 2.4(LL)  Chloride 98 - 111 mmol/L 114(H) 110 111  CO2 22 - 32 mmol/L 21(L) 20(L) 18(L)  Calcium 8.9 - 10.3 mg/dL 7.9(L) 8.1(L) 7.5(L)  Total Protein 6.5 - 8.1 g/dL - - 6.7  Total Bilirubin 0.3 - 1.2 mg/dL - - 0.8  Alkaline Phos 38 - 126 U/L - - 135(H)  AST 15 - 41 U/L - - 25  ALT 0 - 44 U/L - - 28      RADIOGRAPHIC STUDIES: I have personally reviewed the radiological images as listed and agreed with the findings in the report. No results found.   ASSESSMENT & PLAN:  Billy Mendoza is a 68 y.o. male with   1. Proximal rectal adenocarcinoma, metastatic to liver and likely tolungsandnodes, MSS,KRAS/NRAS wild type,BRAF V600E mutation (+) -He was diagnosed in10/2019.He completed 1 cycle of CAPOX before changing to dose reduced FOLFOXIRI andAvastindue to BRAF mutation in his tumor. -Hismetastatic diseaseis not curable with surgery, he will continue treatment for as long as he can tolerate to control disease. -He has been tolerating FOLFOXIRI moderately well. Due to significant cytopenias, chemo was held on 01/04/2019 -He was hospitalized on 01/18/19 for bowel obstruction due to rectal cancer. He had stent placed by Dr. Benson Norway.Marland Kitchen His CT AP showed his known rectal tumor and obstruction, liver mets have decreased in size and multiple new right lung nodules consistent with metastasis..  -Will obtain a CT chest to complete restaging. He is agreeable.  -Even if he has no disease progression in lungs and liver, I recommend changing his current chemo to next line therapy due to his disease progression in rectum. We discussed that it's possible  we see different response at primary and metastatic sites.  -We discussed the next line treatment options. Including chemo and targeted therapy. I recommend the new combination of BRAF/MEK/EGFR inhibitors. Based on recently published the BEACON trial data, patients with BRAF mutated mCRC who received triplet-therapy (encorafenib, binimetinib, and cetuximab) had significantly improved OS (9.4 vs 5.4 months) comparing to control group (cetuximab and irinotecan or FOLFIRI). The triplet-therapy showed excellent clinical benefit (CR 4%, PR 23% and SD 42%).  The study also showed good response in doublet-therapy (encorafenib and cetuximab) although it's slightly inferior than triplet therapy (OS 8.4 months).(N [facility]rief Med [id number].). If pt has significant AEs, will hold binimetinib and continue doublet, and may add irinotecan to doublet. This is also NCCN recommended second or third line therapy for BRAF (+) mCRC.  Due to potential serous side effect from cetuximab (anaphylaxis), I will replace it with panitumumab.   --Side effects  including but does not limited to, fatigue, nausea, vomiting, diarrhea, hair loss, dry skin, acne-like skin rash, abdominal pain, abnormal liver function etc, were discussed with patient in great detail. He agrees to proceed. -the goal of therapy is palliative, to prolong his life and improve his quality of life  -Will repeat CEA with labs at next visit  -F/u in 1 week with start of treatment.   2.recent bowel obstruction from rectal tumor  -s/p stent placement by Dr. Benson Norway on 01/19/2019, he declined loop colostomy but agree to keep surgery as last resort.  -I recommend low residual diet, will ask our dietician teach him  -I recommend him to use Colace every day, and a laxative as needed to keep stool soft/loose, 1-2 times a day and avoid constipation  -we discussed the high possibility of recurrent bowel obstruction due to tumor progression -I will start him on new systemic  therapy as soon as possible -I discussed with rad/onc Dr. Lisbeth Renshaw, who will offer short course (2 weeks) palliative radiation to shrink the tumor, down the road    3. History of right PE -His PE is likely related to his underlying metastatic colon cancer and chemotherapy, especially Avastin. -He is on Xarelto 20 mg daily and will continue indefinitely. Has occasionally epistaxis secondary to Avastin, thrombocytopenia and Xarelto.    4. Pancytopeniasecondary to chemotherapy -Will monitor, improved now   5. HTN  -TakesAmlodipine -BPoverall controlled  6. Goal of care discussion -He is full code now -He understands his disease is incurable,and the goal of therapy is palliative to prolong his life.    Plan  -CT chest in 1 week for restaging  -I will call in Braftovi and Mektovi to [facility], f/u and Panitumumab in 1 week.    No problem-specific Assessment & Plan notes found for this encounter.   Orders Placed This Encounter  Procedures  . CT Chest Wo Contrast    Standing Status:   Future    Standing Expiration Date:   01/23/2020    Order Specific Question:   Preferred imaging location?    Answer:   [id number]    Order Specific Question:   Radiology Contrast Protocol - do NOT remove file path    Answer:   \\[email or web address]   I discussed the assessment and treatment plan with the patient. The patient was provided an opportunity to ask questions and all were answered. The patient agreed with the plan and demonstrated an understanding of the instructions.  The patient was advised to call back or seek an in-person evaluation if the symptoms worsen or if the condition fails to improve as anticipated.  I provided 40 minutes of non face-to-face telephone visit time during this encounter, and > 50% was spent counseling as documented under my assessment & plan.    Truitt Merle, MD 01/23/2019   I, Joslyn Devon, am acting as scribe for Truitt Merle, MD.   I have reviewed the above documentation for accuracy and completeness, and I agree with the above.

## 2019-01-23 NOTE — Progress Notes (Signed)
DISCONTINUE OFF PATHWAY REGIMEN - Colorectal   [id number]:FOLFOXIRI (5-Fluorouracil 2,400 mg/m2/cycle) + Bevacizumab 5 mg/kg IV D1 q14 Days:   A cycle is every 14 days:     Bevacizumab-xxxx      Irinotecan      Oxaliplatin      Leucovorin      5-Fluorouracil   **Always confirm dose/schedule in your pharmacy ordering system**  REASON: Disease Progression PRIOR TREATMENT: Off Pathway: FOLFOXIRI (5-Fluorouracil 2,400 mg/m2/cycle) + Bevacizumab 5 mg/kg IV D1 q14 Days TREATMENT RESPONSE: Partial Response (PR)  START ON PATHWAY REGIMEN - Colorectal     A cycle is every 14 days:     Panitumumab   **Always confirm dose/schedule in your pharmacy ordering system**  Patient Characteristics: Distant Metastases, Second Line, KRAS/NRAS Wild-Type, BRAF Mutation Positive, No Prior Anti-EGFR Therapy Therapeutic Status: Distant Metastases BRAF Mutation Status: Mutation Positive KRAS/NRAS Mutation Status: Wild-Type (no mutation) Line of Therapy: Second Line  Intent of Therapy: Non-Curative / Palliative Intent, Discussed with Patient

## 2019-01-24 ENCOUNTER — Other Ambulatory Visit: Payer: Self-pay | Admitting: Hematology

## 2019-01-24 ENCOUNTER — Encounter: Payer: Self-pay | Admitting: Hematology

## 2019-01-24 ENCOUNTER — Ambulatory Visit: Payer: BLUE CROSS/BLUE SHIELD | Admitting: Nutrition

## 2019-01-24 ENCOUNTER — Telehealth: Payer: Self-pay | Admitting: Pharmacist

## 2019-01-24 ENCOUNTER — Telehealth: Payer: Self-pay | Admitting: Hematology

## 2019-01-24 ENCOUNTER — Telehealth: Payer: Self-pay

## 2019-01-24 DIAGNOSIS — C20 Malignant neoplasm of rectum: Secondary | ICD-10-CM

## 2019-01-24 DIAGNOSIS — C787 Secondary malignant neoplasm of liver and intrahepatic bile duct: Principal | ICD-10-CM

## 2019-01-24 NOTE — Telephone Encounter (Signed)
Oral Oncology Patient Advocate Encounter  Received notification from [employer] that prior authorization for Braftovi and Irean Hong is required.  PA submitted on CoverMyMeds Braftovi Key: [id number] Mektovi Key: [id number] Status is pending  Oral Oncology Clinic will continue to follow.  [place] Patient [place] Phone [phone] Fax [phone] 01/24/2019    11:43 AM

## 2019-01-24 NOTE — Telephone Encounter (Signed)
Oral Oncology Pharmacist Encounter  Received new prescription for Braftovi (encorafenib) and Mektovi (biminetinib) for the treatment of metastatic BRAF V600E mutation positive, Kras wild-type rectal cancer in conjunction with Vectibix (panitumumab), planned duration until disease progression or unacceptable toxicity.  Original diagnosis In Nov 2019 with metastatic disease noted at diagnosis Patient started on treatment with [facility] plus Avastin Nov - Dec 2019 Treatment was changed in Dec 2019 to [facility] plus Avastin when molecular testing revealed BRAF V600E mutation, Dec 2019- March 2020 Treatment was supposed to change to FOLFIRI plus Avastin starting 01/19/19  (oxaliplatin discontinued after 3/19 infusion due to worsening neurotoxicity), however, patient was admitted 4/15 with bowel obstruction due to rectal tumor Stent was placed during admission and patient was discharged 01/20/19.  Patient is now under evaluation to initiation treatment with Braftovi and Mektovi oral medications in combination with infusional Vectibix, chosen over Erbitux due to high incidence of severe hypersensitivity reactions in the [place] with Erbitux use.  Braftovi will be administered at 313m by mouth once daily, using 739mcapsules  Mektovi will be administered at 4577my mouth twice daily, using 59m68mblets Vectibix will be administered at 6mg/40mIV given every 2 weeks  Patient may receive palliative radiation to obstructing mass during systemic therapy. Information on combination of radiotherapy and BRAF inhibitors has been forwarded to MD.  Labs from Epic assessed, OK foKings Mountaintreatment initiation.  No baseline CPK, rhabdomyolysis has occurred with Braftovi/Mektovi administration, will be monitored periodically during treatment  EKG performed 01/18/19 shows QTc 423 msec, safe for medication initiation, prolongation of QTc is possible with Braftovi administration. EKG will be repeated with s/sx cardiac  dysfunction. Electrolytes, including potassium, calcium, and magnesium will monitored during treatment  No baseline assesment of LVEF in Epic, no cardiac history is noted in patient's PMH MEaklyfacturer recommends to assess LVEF prior to treatment initiation, one month after initiation, and then every 2 to 3 months during treatment as cardiomyopathy has occurred in patients receiving combination Braftovi/Mektovi.  Current medication list in Epic reviewed, no DDIs with Braftovi or Mektovi identified.  New medications carry moderate anti-emetic potential. I will confirm patient has home antiemetics during initial counseling and coordinate new Rx's for these if needed.  [facility] and approval by MD.  Oral Oncology Clinic will continue to follow for insurance authorization, copayment issues, initial counseling and start date.  JesseJohny DrillingrmD, BCPS, BCOP  01/24/2019 6:29 AM Oral Oncology Clinic [phone]

## 2019-01-24 NOTE — Progress Notes (Signed)
RD working remotely.  Contacted patient to educate on low fiber diet for minimizing bowel obstructions. He was not available but left a message for him to contact me with my office phone. I emailed him a copy of the low fiber diet. Will attempt to call patient again this afternoon.

## 2019-01-24 NOTE — Telephone Encounter (Signed)
Oral Oncology Patient Advocate Encounter  Prior Authorization for Braftovi and Irean Hong has been approved.    Braftovi PA# [id number] Irean Hong PA# [id number] Effective dates: 01/24/19 through 07/26/19  Oral Oncology Clinic will continue to follow.   [place] Patient [place] Phone [phone] Fax [phone] 01/24/2019    1:09 PM

## 2019-01-24 NOTE — Telephone Encounter (Signed)
Oral Chemotherapy Pharmacist Encounter   I spoke with patient for overview of: Braftovi (encorafenib) and Mektovi (biminetinib) for the treatment of metastatic BRAF V600E mutation positive, Kras wild-type rectal cancer in conjunction with Vectibix (panitumumab), planned duration until disease progression or unacceptable toxicity.  Counseled patient on administration, dosing, side effects, monitoring, drug-food interactions, safe handling, storage, and disposal.  Patient will take Braftovi 75 mg capsules, 4 capsules (300 mg) by mouth once daily, without regard to food.  Patient will take Mektovi 15 mg tablets, 3 tablets (45 mg) by mouth 2 times daily, without regard to food, approximately 12 hours apart.  Patient knows to avoid grapefruit and grapefruit juice while on therapy with Braftovi and Mektovi.  Braftovi and Mektovi start date: ~02/01/19  Adverse effects of Braftovi include but are not limited to: Fatigue, nausea, vomiting, abdominal pain, arthralgia, headache, dizziness, hyperkeratosis or HFS, uveitis (eye pain, photophobia, vision changes), hemorrhage, and QTc prolongation.  Adverse effects of Mektovi include but are not limited to: Fatigue, nausea, vomiting, diarrhea, increased creatinine, increased LFTs, fever, rash, vision changes, peripheral edema, cardiac dysfunction, and hemorrhage  Patient has antinausea medication at the house and will use it if symptoms develop. Patient has anti-diarrheal medication at the house and would alert the office to 4 or more stools above baseline. Patient is trying to keep stool loose at the moment with stent.  Reviewed with patient importance of keeping a medication schedule and plan for any missed doses.  Patient understands that Braftovi and Irean Hong are used in combination.  Mr. and Mrs. Goold voiced understanding and appreciation.   All questions answered. Medication reconciliation performed and medication/allergy list updated.  Patient  knows to call the office with questions or concerns. Oral Oncology Clinic will continue to follow.  Johny Drilling, PharmD, BCPS, BCOP  01/24/2019  2:25 PM Oral Oncology Clinic [phone]

## 2019-01-24 NOTE — Telephone Encounter (Signed)
No los per 4/20.

## 2019-01-25 ENCOUNTER — Ambulatory Visit: Payer: BLUE CROSS/BLUE SHIELD | Admitting: Nutrition

## 2019-01-25 ENCOUNTER — Telehealth: Payer: Self-pay | Admitting: Hematology

## 2019-01-25 MED ORDER — BINIMETINIB 15 MG PO TABS: 45.0000 mg | ORAL_TABLET | Freq: Two times a day (BID) | ORAL | 3 refills | Status: DC

## 2019-01-25 MED ORDER — ENCORAFENIB 75 MG PO CAPS: 300.0000 mg | ORAL_CAPSULE | Freq: Every day | ORAL | 3 refills | Status: DC

## 2019-01-25 NOTE — Telephone Encounter (Signed)
Oral Oncology Pharmacist Encounter  Received notification from the [facility] outpatient pharmacy that they do not have access to dispense Braftovi or Mektovi at this time.  Prescriptions have been eiscribed to [facility] with the hopes of expedited dispensing.  Insurance authorization for both medications have been obtained.  I have also faxed supporting documentation to the pharmacy including demographic info, insurance cards, and medication list.  I will follow-up with [facility] later this morning to ensure expedited dispensing of patient's medications. I will update patient on dispensing pharmacy change once I confirm site of service with [facility].  Johny Drilling, PharmD, BCPS, BCOP  01/25/2019 8:23 AM Oral Oncology Clinic [phone]

## 2019-01-25 NOTE — Progress Notes (Signed)
Contacted patient by telephone to follow-up if he received nutrition facts sheets regarding low fiber diet.  Patient reports he did receive nutrition fax sheets. Reviewed low fiber diet with patient over the phone.  Clarified patient's questions. Patient has no concerns at this time and has my contact information for any further needs.

## 2019-01-25 NOTE — Telephone Encounter (Signed)
Per sch msg, added appt for 3 weeks, 1 week appt was already scheduled. Called patient and notified of added appt.

## 2019-01-25 NOTE — Telephone Encounter (Signed)
Oral Oncology Pharmacist Encounter  Received notification from [facility] that they are not contracted with patient's prescription insurance so will not be able to dispense Braftovi or Mektovi.  Braftovi and Mektovi are also available to be dispensed at [facility], [place], [place], and [facility]. [facility] is contracted with patient's pharmacy.  Prescriptions have now been e-scribed to [facility] pharmacy with the hopes of expedited dispensing.  Insurance authorization for both medications have been obtained.  I have also faxed supporting documentation to the pharmacy at [phone], including demographic info, insurance cards, and medication list.  I will follow-up with [facility] later today to ensure they are able to process patient's prescriptions and provide timely dispensing of medications.  I called and left voicemail at patient's home that Rx's have been moved from [facility] and that I would update as soon as I am able on site of dispensing.  Johny Drilling, PharmD, BCPS, BCOP  01/25/2019 1:56 PM  Oral Oncology Clinic [phone]

## 2019-01-26 ENCOUNTER — Other Ambulatory Visit: Payer: BLUE CROSS/BLUE SHIELD

## 2019-01-26 ENCOUNTER — Encounter: Payer: Self-pay | Admitting: Radiation Oncology

## 2019-01-26 ENCOUNTER — Ambulatory Visit
Admission: RE | Admit: 2019-01-26 | Discharge: 2019-01-26 | Disposition: A | Discharge status: Home / Self Care | Payer: BLUE CROSS/BLUE SHIELD | Source: Ambulatory Visit | Attending: Radiation Oncology | Admitting: Radiation Oncology

## 2019-01-26 ENCOUNTER — Ambulatory Visit: Payer: BLUE CROSS/BLUE SHIELD | Admitting: Hematology

## 2019-01-26 ENCOUNTER — Ambulatory Visit: Payer: BLUE CROSS/BLUE SHIELD

## 2019-01-26 ENCOUNTER — Other Ambulatory Visit: Payer: Self-pay

## 2019-01-26 DIAGNOSIS — C20 Malignant neoplasm of rectum: Secondary | ICD-10-CM

## 2019-01-26 DIAGNOSIS — C787 Secondary malignant neoplasm of liver and intrahepatic bile duct: Principal | ICD-10-CM

## 2019-01-26 NOTE — Progress Notes (Addendum)
Radiation Oncology         [phone] ________________________________  Name: Billy Mendoza        MRN: [id number]  Date of Service: 01/26/2019 DOB: 13-Nov-1950  MP:[id number], Armc1, MD  Billy Merle, MD     REFERRING PHYSICIAN: Truitt Merle, MD   DIAGNOSIS: The encounter diagnosis was Rectal cancer metastasized to liver [facility]).   HISTORY OF PRESENT ILLNESS: Billy Mendoza is a 68 y.o. male seen at the request of Dr. Burr Mendoza for a newly diagnosed rectal cancer. The patient has noticed intermittent rectal bleeding for about a year. He was evaluated by Dr. Benson Mendoza and colonoscopy on 07/28/18 revealed a near obstructing lesion in the rectum and a biopsy revealed a poorly differentiated adenocarcinoma. He had CT imaging of the C/A/P on 07/28/18 revealed concerns for bilateral lung, mediastinal, and liver disease as well as thickening of the rectum at the site of his known diagnosis. He underwent liver biopsy on 08/16/18 that revealed adenocarcinoma. He began systemic chemotherapy with CAPOX and Xeloda between 08/18/18 and 09/07/18, and his therapy was switched to FOLFOXIRI due to a BRFA mutation. He had Avastin added to the regimen and Oxaliplatin held with cycle 9 between 09/07/18 and 12/22/18. He had interval scans on 01/18/19 that revealed colonic obstruction and decrease in his liver disease. He still had evidence of RML and RLL nodules. He has undergone stenting procedure with Dr. Benson Mendoza on 01/19/2019 and has refused surgical diversion. Given the concerns for obstruction, Dr. Burr Mendoza asked [place] to evaluate him for palliative radiotherapy. He's seen via Webex to discuss these options. Of note his last chemotherapy administration was on 12/22/2018, but he's planning on beginning targeted therapy next week.    PREVIOUS RADIATION THERAPY: No   PAST MEDICAL HISTORY:  Past Medical History:  Diagnosis Date  . Cancer [facility])        PAST SURGICAL HISTORY: Past Surgical History:  Procedure Laterality Date  . COLONIC  STENT PLACEMENT N/A 01/19/2019   Procedure: COLONIC STENT PLACEMENT;  Surgeon: Billy Ada, MD;  Location: [facility] ENDOSCOPY;  Service: Endoscopy;  Laterality: N/A;  . FLEXIBLE SIGMOIDOSCOPY N/A 01/19/2019   Procedure: FLEXIBLE SIGMOIDOSCOPY;  Surgeon: Billy Ada, MD;  Location: [facility] ENDOSCOPY;  Service: Endoscopy;  Laterality: N/A;  . IR IMAGING GUIDED PORT INSERTION  08/16/2018  . IR US GUIDE BX ASP/DRAIN  08/16/2018     FAMILY HISTORY:  Family History  Problem Relation Age of Onset  . Cancer Mother 59       breast cancer  . Atrial fibrillation Father   . Heart failure Father   . Cancer Maternal Grandmother        lung cancer      SOCIAL HISTORY:  reports that he has quit smoking. His smoking use included cigarettes and cigars. He quit after 10.00 years of use. He has never used smokeless tobacco. He reports current alcohol use of about 1.0 - 2.0 standard drinks of alcohol per week. He reports that he does not use drugs. The patient is married and lives in [place]. He works for [employer].    ALLERGIES: Patient has no known allergies.   MEDICATIONS:  Current Outpatient Medications  Medication Sig Dispense Refill  . acetaminophen (TYLENOL) 325 MG tablet Take 650 mg by mouth every 6 (six) hours as needed for moderate pain.    Billy Mendoza amLODipine (NORVASC) 5 MG tablet Take 1 tablet (5 mg total) by mouth 2 (two) times daily. 60 tablet 2  . potassium chloride SA (K-DUR)  20 MEQ tablet TAKE 1 TABLET(20 MEQ) BY MOUTH DAILY 30 tablet 1  . rivaroxaban (XARELTO) 20 MG TABS tablet Take 1 tablet (20 mg total) by mouth daily with supper. 30 tablet 5  . VITAMIN E PO Take 1 tablet by mouth daily.    . Binimetinib (MEKTOVI) 15 MG TABS Take 45 mg by mouth 2 (two) times a day. (Patient not taking: Reported on 01/26/2019) 180 tablet 3  . diphenoxylate-atropine (LOMOTIL) 2.5-0.025 MG tablet Take 1-2 tablets by mouth 4 (four) times daily as needed for diarrhea or loose stools. Up to 8 tabs daily for  severe diarrhea (Patient not taking: Reported on 01/26/2019) 60 tablet 2  . Encorafenib (BRAFTOVI) 75 MG CAPS Take 300 mg by mouth daily. (Patient not taking: Reported on 01/26/2019) 120 capsule 3   No current facility-administered medications for this encounter.      REVIEW OF SYSTEMS: On review of systems, the patient reports that he is doing well overall. He reports more rectal mucous since his stent placement. He is not having any rectal pain, and denies any rectal bleeding. He denies any chest pain, shortness of breath, cough, fevers, chills, night sweats, unintended weight changes. He denies any bladder disturbances, and denies abdominal pain, nausea or vomiting. He denies any new musculoskeletal or joint aches or pains, new skin lesions or concerns. A complete review of systems is obtained and is otherwise negative.     PHYSICAL EXAM:  Wt Readings from Last 3 Encounters:  01/19/19 242 lb 15.2 oz (110.2 kg)  01/04/19 243 lb 3.2 oz (110.3 kg)  12/15/18 242 lb 3.2 oz (109.9 kg)   Temp Readings from Last 3 Encounters:  01/20/19 99.2 F (37.3 C) (Oral)  01/04/19 97.9 F (36.6 C) (Oral)  12/24/18 98.4 F (36.9 C) (Oral)   BP Readings from Last 3 Encounters:  01/20/19 136/84  01/04/19 129/89  01/03/19 (!) 131/95   Pulse Readings from Last 3 Encounters:  01/20/19 67  01/04/19 84  01/03/19 95   Pain Assessment Pain Score: 0-No pain  In general this is a well appearing Caucasian male  in no acute distress. He's alert and oriented x4 and appropriate throughout the examination. Cardiopulmonary assessment is negative for acute distress and he exhibits normal effort.    ECOG = 1  0 - Asymptomatic (Fully active, able to carry on all predisease activities without restriction)  1 - Symptomatic but completely ambulatory (Restricted in physically strenuous activity but ambulatory and able to carry out work of a light or sedentary nature. For example, light housework, office work)  2  - Symptomatic, <50% in bed during the day (Ambulatory and capable of all self care but unable to carry out any work activities. Up and about more than 50% of waking hours)  3 - Symptomatic, >50% in bed, but not bedbound (Capable of only limited self-care, confined to bed or chair 50% or more of waking hours)  4 - Bedbound (Completely disabled. Cannot carry on any self-care. Totally confined to bed or chair)  5 - Death   Eustace Pen MM, Creech RH, Tormey DC, et al. [phone]). "Toxicity and response criteria of the [facility]". [place] Oncol. 5 (6): 649-55    LABORATORY DATA:  Lab Results  Component Value Date   WBC 4.8 01/20/2019   HGB 8.3 (L) 01/20/2019   HCT 26.6 (L) 01/20/2019   MCV 115.2 (H) 01/20/2019   PLT 167 01/20/2019   Lab Results  Component Value Date  NA 141 01/20/2019   K 3.3 (L) 01/20/2019   CL 114 (H) 01/20/2019   CO2 21 (L) 01/20/2019   Lab Results  Component Value Date   ALT 28 01/18/2019   AST 25 01/18/2019   ALKPHOS 135 (H) 01/18/2019   BILITOT 0.8 01/18/2019      RADIOGRAPHY: Dg Abd 1 View - Kub  Result Date: 01/19/2019 CLINICAL DATA:  Colonic obstruction EXAM: ABDOMEN - 1 VIEW COMPARISON:  01/18/2019 FINDINGS: Imaging confirms placement of a colonoscope across the obstruction in the rectum. Subsequently, a stent has been deployed across the area of narrowing. IMPRESSION: Successful metallic stent placement across an area of rectal narrowing. Electronically Signed   By: Marybelle Killings M.D.   On: 01/19/2019 14:14   Ct Abdomen Pelvis W Contrast  Result Date: 01/18/2019 CLINICAL DATA:  No bowel movement for 1 week, abdominal pain EXAM: CT ABDOMEN AND PELVIS WITH CONTRAST TECHNIQUE: Multidetector CT imaging of the abdomen and pelvis was performed using the standard protocol following bolus administration of intravenous contrast. CONTRAST:  159mL OMNIPAQUE IOHEXOL 300 MG/ML  SOLN COMPARISON:  11/04/2018 FINDINGS: Lower chest: Right middle  lobe pulmonary nodules with the largest measuring 10 mm enlarged compared with the prior examination. 5 mm right lower lobe pulmonary nodule. Hepatobiliary: Multiple hypodense hepatic masses with the largest in the right hepatic lobe near the dome of the liver measuring 4.3 x 2.8 cm decreased in size compared with 11/04/2018. No gallstones, gallbladder wall thickening, or biliary dilatation. Pancreas: Unremarkable. No pancreatic ductal dilatation or surrounding inflammatory changes. Spleen: Normal in size without focal abnormality. Adrenals/Urinary Tract: Adrenal glands are unremarkable. Bilateral hypodense, fluid attenuating renal masses consistent with cysts. No urolithiasis or obstructive uropathy. Bladder is unremarkable. Stomach/Bowel: Stomach is normal. Severe colonic distension to the level of the rectum. Colon is fluid-filled. Focal area of rectal wall thickening most consistent with rectal carcinoma with perirectal tumor along the left lateral aspect measuring 1.5 x 1.2 cm. Stable presacral edema. No pneumatosis, pneumoperitoneum or portal venous gas. Vascular/Lymphatic: Abdominal aortic atherosclerosis. Normal caliber abdominal aorta. No lymphadenopathy. Reproductive: Prostate is unremarkable. Other: No fluid collection or hematoma. Fat containing right inguinal hernia. Musculoskeletal: No acute osseous abnormality. No aggressive osseous lesion. IMPRESSION: 1. Colonic obstruction resulting from rectal carcinoma. 2. Hepatic metastatic disease which is decreased in size compared with the prior examination of 11/04/2018. 3. Multiple right middle lobe and right lower lobe pulmonary nodules most consistent with metastatic disease. Electronically Signed   By: Kathreen Devoid   On: 01/18/2019 21:11   Dg C-arm 1-60 Min-no Report  Result Date: 01/19/2019 Fluoroscopy was utilized by the requesting physician.  No radiographic interpretation.       IMPRESSION/PLAN: 1. Stage IV poorly differentiated  adenocarcinoma of the rectum. Dr. Lisbeth Renshaw reviews the course thus far since our last discussion and the imaging from his recent CT.  We discussed the risks, benefits, short, and long term effects of radiotherapy. Dr. Lisbeth Renshaw discusses the delivery and logistics of radiotherapy and anticipates a course of 3 weeks of radiotherapy if his symptoms progress.  We will plan on simulation next week. We discussed consent today and he will formally sign paperwork at the time of his simulation.  This encounter was provided by telemedicine platform Webex.  The patient has given verbal consent for this type of encounter and has been advised to only accept a meeting of this type in a secure network environment. The time spent during this encounter was 45 minutes. The attendants for this  meeting include Dr. Lisbeth Renshaw, Hayden Pedro  and [name].  During the encounter,  Dr. Lisbeth Renshaw, and Hayden Pedro were located at [facility] Radiation Oncology Department.  Akil Hoos was located at home.    The above documentation reflects my direct findings during this shared patient visit. Please see the separate note by Dr. Lisbeth Renshaw on this date for the remainder of the patient's plan of care.     Carola Rhine, PAC

## 2019-01-26 NOTE — Addendum Note (Signed)
Encounter addended by: Hayden Pedro, PA-C on: 01/26/2019 9:58 AM  Actions taken: Clinical Note Signed

## 2019-01-26 NOTE — Telephone Encounter (Signed)
Oral Oncology Pharmacist Encounter  Received voicemail from Legrand Como at [facility] at 5:20pm on 4/22 with information that they are about to process patient's claim with his prescription insurance company for [employer]. Copayments are ~$177, and patient should be eligible for manufacturer copayment coupons, if they are available for medications. Message stated they would be reaching out to patient to schedule delivery.  I returned call to [employer] [facility] this morning at [phone]. Patient should not have copayment as he has commercial primary prescription coverage and [employer] for secondary insurance. Representative stated she is unable to locate fax sent from the office at ~1:45pm on 4/22 that included copies of patient's insurance cards. She also states the pharmacy tried to contact patient at 4:30pm on 4/22 to set up shipment of 1st fill of medicines but were not able to reach patient or leave a message.  Representative will have a member of the insurance team continue to look for the fax sent yesterday and reach out to me later this morning. I also confirmed that they have the correct contact phone number for patient as we have been able to contact patient and leave messages without issue.  I will follow-up with dispensing pharmacy and patient later this morning. Noted patient is scheduled for 1st Vectibix infusion on 02/01/19, and this is also planned start date of Braftovi and Mektovi.  Johny Drilling, PharmD, BCPS, BCOP  01/26/2019 8:11 AM Oral Oncology Clinic [phone]

## 2019-01-26 NOTE — Telephone Encounter (Signed)
Oral Oncology Pharmacist Encounter  Received fax notification from [employer] [facility] that they have processed patient's prescriptions for $0 copayments. They will be reaching out to patient to schedule delivery of first fills of Braftovi and Mektovi.  I called and left voicemail for patient to expect a call from dispensing pharmacy and that he is welcome to go on and call the pharmacy at [phone] to schedule his first fill of medications instead of waiting for them to call.  I also left contact number for oral oncology clinic if patient has any additional questions or concerns.  Johny Drilling, PharmD, BCPS, BCOP  01/26/2019 12:14 PM Oral Oncology Clinic [phone]

## 2019-01-27 NOTE — Telephone Encounter (Signed)
Oral Oncology Pharmacist Encounter  I followed up with [facility] pharmacy today to check on status of Braftovi and Mektovi. Medications are shipping today for delivery to patient's shipping address on Monday 4/27. Copayments $0 for both medications.  Johny Drilling, PharmD, BCPS, BCOP  01/27/2019 12:43 PM Oral Oncology Clinic [phone]

## 2019-01-30 NOTE — Progress Notes (Signed)
[place]   [phone] Fax:([phone]) [phone]   Clinic Follow up Note   Patient Care Team: Merry Proud, MD as PCP - General (Internal Medicine) Truitt Merle, MD as Consulting Physician (Hematology) Carol Ada, MD as Consulting Physician (Gastroenterology)  Date of Service:  02/01/2019  CHIEF COMPLAINT: : F/u of rectal cancer metastasized to liver  SUMMARY OF ONCOLOGIC HISTORY: Oncology History   Cancer Staging Rectal cancer metastasized to liver [facility]) Staging form: Colon and Rectum, AJCC 8th Edition - Clinical stage from 07/28/2018: Stage IVB (cTX, cNX, pM1b) - Signed by Truitt Merle, MD on 08/18/2018       Rectal cancer metastasized to liver ([place])   07/28/2018 Initial Biopsy    Biopsy 07/28/18 Final Microscopic Diagnosis A.Colon, Ascending, Polyp:  -Fragments of tubular adenoma -No high-grade dysplasia of malignancy.  B. Rectum, Mass, Polyp:  -Invasive adenocarcinoma, moderately - poorly differentiatred.     07/28/2018 Cancer Staging    Staging form: Colon and Rectum, AJCC 8th Edition - Clinical stage from 07/28/2018: Stage IVB (cTX, cNX, pM1b) - Signed by Truitt Merle, MD on 08/18/2018    08/04/2018 Initial Diagnosis    Rectal cancer metastasized to liver ([place])    08/16/2018 Pathology Results    Biospy  Diagnosis 08/16/18 Liver, needle/core biopsy - ADENOCARCINOMA. Microscopic Comment By morphology, the findings are compatible with the provided clinical history of colorectal adenocarcinoma. Results reported to [name] on 08/18/2018. Intradepartmental consultation was obtained ([name]).     08/16/2018 Miscellaneous    Foundation 1 genomic testing: MS-stable Tumor mutation burden  3/Mb K-ras/NRAS wild-type BRAF V600 E KDMSA R266Q PTEN T364f* SMAD4 loss TP53 loss      08/18/2018 Procedure    Colonoscopy by Dr. HBenson Norwayshowed malignant appearing partially obstructing tumor in the rectum, 8 cm above anal verge.    08/18/2018 - 09/07/2018 Chemotherapy    CAPOX q3weeks with Xeloda 20099mBID 2 weeks on, 1 weeks off starting 08/18/18.  Swithced treatment due to BRAF mutation.     09/07/2018 - 12/22/2018 Chemotherapy    FOLFOXIRI and avastin every 2 weeks starting 09/07/18. Oxaliplatin held starting with cycle 8 and continue with FOLFIRI and Avastin. D/c due to disease progression.     11/04/2018 Imaging    CT CAP 11/04/18 IMPRESSION: 1. Peripheral filling defect in the right main pulmonary artery extending primarily into the right lower lobe pulmonary artery. While the peripheral location indicates some degree of chronicity of this pulmonary embolus, this is a new finding compared to 07/28/2018. The right ventricular to left ventricular ratio is 0.7, which does not specifically indicate right heart strain. 2. The malignancy appears considerably improved, with reduced size pulmonary nodules; reduced size and increased internal necrosis of the hepatic metastatic lesions; marked improvement in the thoracic adenopathy; reduced conspicuity of the rectal tumor; and significant reduction in perirectal tumor burden/adenopathy. 3. Other imaging findings of potential clinical significance: Aortic Atherosclerosis (ICD10-I70.0). Coronary atherosclerosis. Bilateral renal cysts. Small umbilical hernia contains adipose tissue.  Critical Value/emergent results were called by telephone at the time of interpretation on 11/04/2018 at 3:05 pm to Dr. YATruitt Merle who verbally acknowledged these results.    01/18/2019 Imaging    CT AP W contrast  IMPRESSION: 1. Colonic obstruction resulting from rectal carcinoma. 2. Hepatic metastatic disease which is decreased in size compared with the prior examination of 11/04/2018. 3. Multiple right middle lobe and right lower lobe pulmonary nodules most consistent with metastatic disease.    02/01/2019 -  Chemotherapy    second line BRAFTOVI(encorafenib) 379m daily and  MEKTOVI(binimetinib), 425mq12h and Vectibix q2weeks starting 02/01/19      CURRENT THERAPY:  Second line BRAFTOVI(encorafenib) 30072maily and MEKTOVI(binimetinib), 76m17m2h and Vectibix q2weeks starting 02/01/19  INTERVAL HISTORY:  Billy Gitlinhere for a follow up and start of second-line treatment. He presents to the clinic today by himself. He notes he is feeling well and notes energy is still not adequate. He notes he is working on getting his stool regular. He has bowel movement every 2-3 days. He notes he continues to use Miralax as needed. He spoke with Dietician and will proceed with low residual diet.     REVIEW OF SYSTEMS:   Constitutional: Denies fevers, chills or abnormal weight loss Eyes: Denies blurriness of vision Ears, nose, mouth, throat, and face: Denies mucositis or sore throat Respiratory: Denies cough, dyspnea or wheezes Cardiovascular: Denies palpitation, chest discomfort or lower extremity swelling Gastrointestinal:  Denies nausea, heartburn (+) constipation  Skin: Denies abnormal skin rashes Lymphatics: Denies new lymphadenopathy or easy bruising Neurological:Denies numbness, tingling or new weaknesses Behavioral/Psych: Mood is stable, no new changes  All other systems were reviewed with the patient and are negative.  MEDICAL HISTORY:  Past Medical History:  [facility] Date  [facility]  SURGICAL HISTORY: Past Surgical History:  Procedure Laterality Date  . COLONIC STENT PLACEMENT N/A 01/19/2019   Procedure: COLONIC STENT PLACEMENT;  Surgeon: HungCarol Ada;  Location: [facility];  Service: Endoscopy;  Laterality: N/A;  . FLEXIBLE SIGMOIDOSCOPY N/A 01/19/2019   Procedure: FLEXIBLE SIGMOIDOSCOPY;  Surgeon: HungCarol Ada;  Location: [facility];  Service: Endoscopy;  Laterality: N/A;  . IR IMAGING GUIDED PORT INSERTION  08/16/2018  . IR US G[place] BX ASP/DRAIN  08/16/2018    I have reviewed the social history and family history with the  patient and they are unchanged from previous note.  ALLERGIES:  has No Known Allergies.  MEDICATIONS:  Current Outpatient Medications  Medication Sig Dispense Refill  . acetaminophen (TYLENOL) 325 MG tablet Take 650 mg by mouth every 6 (six) hours as needed for moderate pain.    . amMarland KitchenODipine (NORVASC) 5 MG tablet Take 1 tablet (5 mg total) by mouth 2 (two) times daily. 60 tablet 2  . Binimetinib (MEKTOVI) 15 MG TABS Take 45 mg by mouth 2 (two) times a day. (Patient not taking: Reported on 01/26/2019) 180 tablet 3  . clindamycin (CLINDAGEL) 1 % gel Apply topically 2 (two) times daily. 60 g 0  . diphenoxylate-atropine (LOMOTIL) 2.5-0.025 MG tablet Take 1-2 tablets by mouth 4 (four) times daily as needed for diarrhea or loose stools. Up to 8 tabs daily for severe diarrhea (Patient not taking: Reported on 01/26/2019) 60 tablet 2  . Encorafenib (BRAFTOVI) 75 MG CAPS Take 300 mg by mouth daily. (Patient not taking: Reported on 01/26/2019) 120 capsule 3  . potassium chloride SA (K-DUR) 20 MEQ tablet TAKE 1 TABLET(20 MEQ) BY MOUTH DAILY 30 tablet 1  . rivaroxaban (XARELTO) 20 MG TABS tablet Take 1 tablet (20 mg total) by mouth daily with supper. 30 tablet 5  . VITAMIN E PO Take 1 tablet by mouth daily.     No current facility-administered medications for this visit.    Facility-Administered Medications Ordered in Other Visits  Medication Dose Route Frequency Provider Last Rate Last Dose  . sodium chloride flush (NS) 0.9 % injection 10 mL  10 mL Intracatheter PRN FengTruitt Merle  10 mL at 02/01/19 1117    PHYSICAL EXAMINATION: ECOG PERFORMANCE STATUS: 1 - Symptomatic but completely ambulatory  Vitals:   02/01/19 0854  BP: 115/88  Pulse: 84  Resp: 18  Temp: 97.8 F (36.6 C)  SpO2: 100%   Filed Weights   02/01/19 0854  Weight: 244 lb 4.8 oz (110.8 kg)    GENERAL:alert, no distress and comfortable SKIN: skin color, texture, turgor are normal, no rashes or significant lesions EYES: normal,  Conjunctiva are pink and non-injected, sclera clear OROPHARYNX:no exudate, no erythema and lips, buccal mucosa, and tongue normal  NECK: supple, thyroid normal size, non-tender, without nodularity LYMPH:  no palpable lymphadenopathy in the cervical, axillary or inguinal LUNGS: clear to auscultation and percussion with normal breathing effort HEART: regular rate & rhythm and no murmurs and no lower extremity edema ABDOMEN:abdomen soft, non-tender and normal bowel sounds Musculoskeletal:no cyanosis of digits and no clubbing  NEURO: alert & oriented x 3 with fluent speech, no focal motor/sensory deficits  LABORATORY DATA:  I have reviewed the data as listed CBC Latest Ref Rng & Units 02/01/2019 01/20/2019 01/19/2019  WBC 4.0 - 10.5 K/uL 8.7 4.8 6.2  Hemoglobin 13.0 - 17.0 g/dL 10.4(L) 8.3(L) 9.6(L)  Hematocrit 39.0 - 52.0 % 33.3(L) 26.6(L) 29.9(L)  Platelets 150 - 400 K/uL 256 167 177     CMP Latest Ref Rng & Units 02/01/2019 01/20/2019 01/19/2019  Glucose 70 - 99 mg/dL 98 105(H) 125(H)  BUN 8 - 23 mg/dL _0 Creatinine 0.61 - 1.24 mg/dL 0.92 0.73 0.71  Sodium 135 - 145 mmol/L 139 141 138  Potassium 3.5 - 5.1 mmol/L 4.3 3.3(L) 3.1(L)  Chloride 98 - 111 mmol/L 107 114(H) 110  CO2 22 - 32 mmol/L 21(L) 21(L) 20(L)  Calcium 8.9 - 10.3 mg/dL 9.2 7.9(L) 8.1(L)  Total Protein 6.5 - 8.1 g/dL 7.8 - -  Total Bilirubin 0.3 - 1.2 mg/dL 0.4 - -  Alkaline Phos 38 - 126 U/L 187(H) - -  AST 15 - 41 U/L 29 - -  ALT 0 - 44 U/L 26 - -      RADIOGRAPHIC STUDIES: I have personally reviewed the radiological images as listed and agreed with the findings in the report. No results found.   ASSESSMENT & PLAN:  Billy Mendoza is a 69 y.o. male with   1. Proximal rectal adenocarcinoma, metastatic to liver and likely tolungsandnodes, MSS,KRAS/NRAS wild type,BRAF V600E mutation (+) -He was diagnosed in10/2019.He completed 1 cycle of CAPOX before changing to dose reduced FOLFOXIRI andAvastindue to  BRAF mutation in his tumor. -Hismetastatic diseaseis not curable with surgery, he will continue treatment for as long as he can tolerate to control disease. -He has been tolerating FOLFOXIRI moderately well. Due to significant cytopenias, chemo was held on 01/04/2019 -He was hospitalized on 01/18/19 for bowel obstruction due to rectal cancer. He had stent placed by Dr. Benson Norway.Marland Kitchen His CT AP showed his known rectal tumor and obstruction, liver mets have decreased in size and multiple new right lung nodules consistent with metastasis. -We discussed the next line treatment options. Including chemo and targeted therapy. I recommend the new combination of oral BRAF/MEK/EGFR inhibitors with Vectibix q2weeks which he will start today. I again reviewed side effects. I have called in Clindamycin gel and doxycycline for possible skin rash. He agrees to proceed  -I discussed given rectal tumor, he will undergo 3 weeks palliative radiation with Dr. Lisbeth Renshaw for local treatment to shrink his rectal tumor and prevent recurrent bowel  obstruction. He will start in about 3 weeks. I reviewed side effects and discussed holding Mektovi during RT as he may not be able to tolerate both. He is agreeable.  -Labs reviewed, Urine protein normal, CBC WNL except Hg 10.4, CMP WNL. Iron panel and CEA is still pending. Overall adequate to proceed with IV Vectibix, Oral Braftovi and Mektovi today.  -Will f/u with phone call next week for toxicity check. I encouraged him to contact clinic if he developers significant or unexpected side effects or dehydration or fever.    2. Recent bowel obstruction from rectal tumor  -s/p stent placement by Dr. Benson Norway on 01/19/2019, he declined loop colostomy but agree to keep surgery as last resort.  -Continue to use Colace every day, and a laxative as needed to keep stool soft/loose, 1-2 times a day and avoid constipation  -He understands the high possibility of recurrent bowel obstruction due to tumor  progression -He has met with dietician. He will continue with low residual diet.  -I previously discussed with rad/onc Dr. Lisbeth Renshaw, who will offer short course (3 weeks) palliative radiation to shrink the tumor. This has been post-poned to Mid-May.  -I discussed diarrhea is common with this second-line regimen. If he develops diarrhea he will use Imodium and I will call in lomotil if needed. He will continue oral potassium.    3. History of right PE -His PE is likely related to his underlying metastatic colon cancer and chemotherapy, especially Avastin. -He is on Xarelto 20 mg daily and will continue indefinitely. Has occasionally epistaxis secondary to Avastin,thrombocytopenia and Xarelto.  4. Pancytopeniasecondarytochemotherapy -Will monitor, improved now, WBC, ANC and PLT WNL, Hg at 10.4 today (02/01/19), overall improved   5. HTN  -TakesAmlodipine -BPoverall controlled   6. Goal of care discussion -He is full code now -He understands his disease is incurable,and the goal of therapy is palliative to prolong his life.    Plan  -I prescribed clindamycin gel  -Labs reviewed and adequate to proceed with first cycle IV Vectibix and Oral Braftovi and Mektovi today  -F/u with call next week  -Lab, flush, f/u with [place] and Vectibix in 2 weeks, will hold Meotovi in 2 weeks  -he will like start RT the week of 5/18    No problem-specific Assessment & Plan notes found for this encounter.   No orders of the defined types were placed in this encounter.  All questions were answered. The patient knows to call the clinic with any problems, questions or concerns. No barriers to learning was detected. I spent 25 minutes counseling the patient face to face. The total time spent in the appointment was 30 minutes and more than 50% was on counseling and review of test results     Truitt Merle, MD 02/01/2019   I, Joslyn Devon, am acting as scribe for Truitt Merle, MD.   I have  reviewed the above documentation for accuracy and completeness, and I agree with the above.

## 2019-02-01 ENCOUNTER — Other Ambulatory Visit: Payer: Self-pay

## 2019-02-01 ENCOUNTER — Telehealth: Payer: Self-pay | Admitting: Hematology

## 2019-02-01 ENCOUNTER — Inpatient Hospital Stay: Payer: BLUE CROSS/BLUE SHIELD

## 2019-02-01 ENCOUNTER — Encounter: Payer: Self-pay | Admitting: Hematology

## 2019-02-01 ENCOUNTER — Inpatient Hospital Stay (HOSPITAL_BASED_OUTPATIENT_CLINIC_OR_DEPARTMENT_OTHER): Payer: BLUE CROSS/BLUE SHIELD | Admitting: Hematology

## 2019-02-01 VITALS — BP 115/88 | HR 84 | Temp 97.8°F | Resp 18 | Ht 73.0 in | Wt 244.3 lb

## 2019-02-01 DIAGNOSIS — C787 Secondary malignant neoplasm of liver and intrahepatic bile duct: Secondary | ICD-10-CM

## 2019-02-01 DIAGNOSIS — Z7901 Long term (current) use of anticoagulants: Secondary | ICD-10-CM

## 2019-02-01 DIAGNOSIS — C20 Malignant neoplasm of rectum: Secondary | ICD-10-CM

## 2019-02-01 DIAGNOSIS — I2699 Other pulmonary embolism without acute cor pulmonale: Secondary | ICD-10-CM | POA: Diagnosis not present

## 2019-02-01 DIAGNOSIS — Z5112 Encounter for antineoplastic immunotherapy: Secondary | ICD-10-CM | POA: Diagnosis not present

## 2019-02-01 DIAGNOSIS — Z95828 Presence of other vascular implants and grafts: Secondary | ICD-10-CM

## 2019-02-01 DIAGNOSIS — I10 Essential (primary) hypertension: Secondary | ICD-10-CM | POA: Diagnosis not present

## 2019-02-01 DIAGNOSIS — D61810 Antineoplastic chemotherapy induced pancytopenia: Secondary | ICD-10-CM | POA: Diagnosis not present

## 2019-02-01 DIAGNOSIS — K56609 Unspecified intestinal obstruction, unspecified as to partial versus complete obstruction: Secondary | ICD-10-CM

## 2019-02-01 LAB — CMP (CANCER CENTER ONLY)
ALT: 26 U/L (ref 0–44)
AST: 29 U/L (ref 15–41)
Albumin: 3.2 g/dL — ABNORMAL LOW (ref 3.5–5.0)
Alkaline Phosphatase: 187 U/L — ABNORMAL HIGH (ref 38–126)
Anion gap: 11 (ref 5–15)
BUN: 16 mg/dL (ref 8–23)
CO2: 21 mmol/L — ABNORMAL LOW (ref 22–32)
Calcium: 9.2 mg/dL (ref 8.9–10.3)
Chloride: 107 mmol/L (ref 98–111)
Creatinine: 0.92 mg/dL (ref 0.61–1.24)
GFR, Est AFR Am: >60 mL/min (ref >60)
GFR, Estimated: >60 mL/min (ref >60)
Glucose, Bld: 98 mg/dL (ref 70–99)
Potassium: 4.3 mmol/L (ref 3.5–5.1)
Sodium: 139 mmol/L (ref 135–145)
Total Bilirubin: 0.4 mg/dL (ref 0.3–1.2)
Total Protein: 7.8 g/dL (ref 6.5–8.1)

## 2019-02-01 LAB — CBC WITH DIFFERENTIAL (CANCER CENTER ONLY)
Abs Immature Granulocytes: 0.04 10*3/uL (ref 0.00–0.07)
Basophils Absolute: 0.1 10*3/uL (ref 0.0–0.1)
Basophils Relative: 1 %
Eosinophils Absolute: 0.2 10*3/uL (ref 0.0–0.5)
Eosinophils Relative: 3 %
HCT: 33.3 % — ABNORMAL LOW (ref 39.0–52.0)
Hemoglobin: 10.4 g/dL — ABNORMAL LOW (ref 13.0–17.0)
Immature Granulocytes: 1 %
Lymphocytes Relative: 26 %
Lymphs Abs: 2.3 10*3/uL (ref 0.7–4.0)
MCH: 35 pg — ABNORMAL HIGH (ref 26.0–34.0)
MCHC: 31.2 g/dL (ref 30.0–36.0)
MCV: 112.1 fL — ABNORMAL HIGH (ref 80.0–100.0)
Monocytes Absolute: 0.9 10*3/uL (ref 0.1–1.0)
Monocytes Relative: 10 %
Neutro Abs: 5.3 10*3/uL (ref 1.7–7.7)
Neutrophils Relative %: 59 %
Platelet Count: 256 10*3/uL (ref 150–400)
RBC: 2.97 MIL/uL — ABNORMAL LOW (ref 4.22–5.81)
RDW: 16 % — ABNORMAL HIGH (ref 11.5–15.5)
WBC Count: 8.7 10*3/uL (ref 4.0–10.5)
nRBC: 0 % (ref 0.0–0.2)

## 2019-02-01 LAB — CK: Total CK: 39 U/L — ABNORMAL LOW (ref 49–397)

## 2019-02-01 LAB — CEA (IN HOUSE-CHCC): CEA (CHCC-In House): 109.3 ng/mL — ABNORMAL HIGH (ref 0.00–5.00)

## 2019-02-01 LAB — TOTAL PROTEIN, URINE DIPSTICK: Protein, ur: NEGATIVE mg/dL

## 2019-02-01 LAB — FERRITIN: Ferritin: 76 ng/mL (ref 24–336)

## 2019-02-01 LAB — MAGNESIUM: Magnesium: 2 mg/dL (ref 1.7–2.4)

## 2019-02-01 MED ORDER — SODIUM CHLORIDE 0.9% FLUSH
10.0000 mL | INTRAVENOUS | Status: DC | PRN
  Administered 2019-02-01: 10 mL
  Filled 2019-02-01: qty 10

## 2019-02-01 MED ORDER — SODIUM CHLORIDE 0.9 % IV SOLN
5.4000 mg/kg | Freq: Once | INTRAVENOUS | Status: AC
  Administered 2019-02-01: 600 mg via INTRAVENOUS
  Filled 2019-02-01: qty 20

## 2019-02-01 MED ORDER — CLINDAMYCIN PHOSPHATE 1 % EX GEL: Freq: Two times a day (BID) | CUTANEOUS | 0 refills | Status: DC

## 2019-02-01 MED ORDER — HEPARIN SOD (PORK) LOCK FLUSH 100 UNIT/ML IV SOLN
500.0000 [IU] | Freq: Once | INTRAVENOUS | Status: AC | PRN
  Administered 2019-02-01: 500 [IU]
  Filled 2019-02-01: qty 5

## 2019-02-01 MED ORDER — SODIUM CHLORIDE 0.9 % IV SOLN
Freq: Once | INTRAVENOUS | Status: AC
  Administered 2019-02-01: 10:00:00 via INTRAVENOUS
  Filled 2019-02-01: qty 250

## 2019-02-01 NOTE — Patient Instructions (Signed)
[place] Discharge Instructions for Patients Receiving Chemotherapy  Today you received the following chemotherapy agents: panitumumab (Vectibix).   To help prevent nausea and vomiting after your treatment, we encourage you to take your nausea medication as prescribed by your physician.    If you develop nausea and vomiting that is not controlled by your nausea medication, call the clinic.   BELOW ARE SYMPTOMS THAT SHOULD BE REPORTED IMMEDIATELY:  *FEVER GREATER THAN 100.5 F  *CHILLS WITH OR WITHOUT FEVER  NAUSEA AND VOMITING THAT IS NOT CONTROLLED WITH YOUR NAUSEA MEDICATION  *UNUSUAL SHORTNESS OF BREATH  *UNUSUAL BRUISING OR BLEEDING  TENDERNESS IN MOUTH AND THROAT WITH OR WITHOUT PRESENCE OF ULCERS  *URINARY PROBLEMS  *BOWEL PROBLEMS  UNUSUAL RASH Items with * indicate a potential emergency and should be followed up as soon as possible.  Feel free to call the clinic should you have any questions or concerns. The clinic phone number is [phone].  Please show the [place] at check-in to the Emergency Department and triage nurse.  Panitumumab Solution for Injection What is this medicine? PANITUMUMAB (pan i TOOM ue mab) is a monoclonal antibody. It is used to treat colorectal cancer. This medicine may be used for other purposes; ask your health care provider or pharmacist if you have questions. COMMON BRAND NAME(S): Vectibix What should I tell my health care provider before I take this medicine? They need to know if you have any of these conditions: -eye disease, vision problems -low levels of calcium, magnesium, or potassium in the blood -lung or breathing disease, like asthma -skin conditions or sensitivity -an unusual or allergic reaction to panitumumab, other medicines, foods, dyes, or preservatives -pregnant or trying to get pregnant -breast-feeding How should I use this medicine? This drug is given as an infusion into a vein. It is  administered in a hospital or clinic by a specially trained health care professional. Talk to your pediatrician regarding the use of this medicine in children. Special care may be needed. Overdosage: If you think you have taken too much of this medicine contact a poison control center or emergency room at once. NOTE: This medicine is only for you. Do not share this medicine with others. What if I miss a dose? It is important not to miss your dose. Call your doctor or health care professional if you are unable to keep an appointment. What may interact with this medicine? Do not take this medicine with any of the following medications: -bevacizumab This list may not describe all possible interactions. Give your health care provider a list of all the medicines, herbs, non-prescription drugs, or dietary supplements you use. Also tell them if you smoke, drink alcohol, or use illegal drugs. Some items may interact with your medicine. What should I watch for while using this medicine? Visit your doctor for checks on your progress. This drug may make you feel generally unwell. This is not uncommon, as chemotherapy can affect healthy cells as well as cancer cells. Report any side effects. Continue your course of treatment even though you feel ill unless your doctor tells you to stop. This medicine can make you more sensitive to the sun. Keep out of the sun while receiving this medicine and for 2 months after the last dose. If you cannot avoid being in the sun, wear protective clothing and use sunscreen. Do not use sun lamps or tanning beds/booths. In some cases, you may be given additional medicines to help with side  effects. Follow all directions for their use. Call your doctor or health care professional for advice if you get a fever, chills or sore throat, or other symptoms of a cold or flu. Do not treat yourself. This drug decreases your body's ability to fight infections. Try to avoid being around people  who are sick. Avoid taking products that contain aspirin, acetaminophen, ibuprofen, naproxen, or ketoprofen unless instructed by your doctor. These medicines may hide a fever. Do not become pregnant while taking this medicine and for 2 months after the last dose. Women should inform their doctor if they wish to become pregnant or think they might be pregnant. There is a potential for serious side effects to an unborn child. Talk to your health care professional or pharmacist for more information. Do not breast-feed an infant while taking this medicine or for 2 months after the last dose. What side effects may I notice from receiving this medicine? Side effects that you should report to your doctor or health care professional as soon as possible: -allergic reactions like skin rash, itching or hives, swelling of the face, lips, or tongue -breathing problems -changes in vision -eye pain -fast, irregular heartbeat -fever, chills -mouth sores -red spots on the skin -redness, blistering, peeling or loosening of the skin, including inside the mouth -signs and symptoms of kidney injury like trouble passing urine or change in the amount of urine -signs and symptoms of low blood pressure like dizziness; feeling faint or lightheaded, falls; unusually weak or tired -signs of low calcium like fast heartbeat, muscle cramps or muscle pain; pain, tingling, numbness in the hands or feet; seizures -signs and symptoms of low magnesium like muscle cramps, pain, or weakness; tremors; seizures; or fast, irregular heartbeat -signs and symptoms of low potassium like muscle cramps or muscle pain; chest pain; dizziness; feeling faint or lightheaded, falls; palpitations; breathing problems; or fast, irregular heartbeat -swelling of the ankles, feet, hands Side effects that usually do not require medical attention (report to your doctor or health care professional if they continue or are bothersome): -changes in skin like  acne, cracks, skin dryness -diarrhea -eyelash growth -headache -mouth sores -nail changes -nausea, vomiting This list may not describe all possible side effects. Call your doctor for medical advice about side effects. You may report side effects to FDA at 1-800-FDA-1088. Where should I keep my medicine? This drug is given in a hospital or clinic and will not be stored at home. NOTE: This sheet is a summary. It may not cover all possible information. If you have questions about this medicine, talk to your doctor, pharmacist, or health care provider.  2019 Elsevier/Gold Standard (2016-04-10 16:45:04)

## 2019-02-01 NOTE — Telephone Encounter (Signed)
No los per 4/29. °

## 2019-02-02 ENCOUNTER — Other Ambulatory Visit: Payer: BLUE CROSS/BLUE SHIELD

## 2019-02-02 ENCOUNTER — Encounter: Payer: Self-pay | Admitting: Hematology

## 2019-02-02 ENCOUNTER — Ambulatory Visit: Payer: BLUE CROSS/BLUE SHIELD

## 2019-02-02 ENCOUNTER — Ambulatory Visit: Payer: BLUE CROSS/BLUE SHIELD | Admitting: Hematology

## 2019-02-03 ENCOUNTER — Ambulatory Visit: Payer: BLUE CROSS/BLUE SHIELD | Admitting: Radiation Oncology

## 2019-02-03 ENCOUNTER — Telehealth: Payer: Self-pay | Admitting: Hematology

## 2019-02-03 NOTE — Telephone Encounter (Signed)
Added appointments per 4/29 schedule message. Not able to reach patient by phone at home or leave message. Message left on phone listed under mobile. Follow up 5/7 is telephone visit. Patient will get updated schedule at already scheduled 5/14 visit.

## 2019-02-07 ENCOUNTER — Other Ambulatory Visit: Payer: Self-pay | Admitting: Hematology

## 2019-02-07 DIAGNOSIS — I10 Essential (primary) hypertension: Secondary | ICD-10-CM

## 2019-02-07 NOTE — Progress Notes (Signed)
[facility]   [phone] [phone]   Clinic Follow up Note   Patient Care Team: Merry Proud, MD as PCP - General (Internal Medicine) Truitt Merle, MD as Consulting Physician (Hematology) Carol Ada, MD as Consulting Physician (Gastroenterology)   I connected with Billy Mendoza on 02/09/2019 at 12:45 PM EDT by telephone visit and verified that I am speaking with the correct person using two identifiers.  I discussed the limitations, risks, security and privacy concerns of performing an evaluation and management service by telephone and the availability of in person appointments. I also discussed with the patient that there may be a patient responsible charge related to this service. The patient expressed understanding and agreed to proceed.   Patient's location:  At work Provider's location:  My Office  CHIEF COMPLAINT: F/u of rectal cancer metastasized to liver  SUMMARY OF ONCOLOGIC HISTORY: Oncology History   Cancer Staging Rectal cancer metastasized to liver [facility]) Staging form: Colon and Rectum, AJCC 8th Edition - Clinical stage from 07/28/2018: Stage IVB (cTX, cNX, pM1b) - Signed by Truitt Merle, MD on 08/18/2018       Rectal cancer metastasized to liver ([place])   07/28/2018 Initial Biopsy    Biopsy 07/28/18 Final Microscopic Diagnosis A.Colon, Ascending, Polyp:  -Fragments of tubular adenoma -No high-grade dysplasia of malignancy.  B. Rectum, Mass, Polyp:  -Invasive adenocarcinoma, moderately - poorly differentiatred.     07/28/2018 Cancer Staging    Staging form: Colon and Rectum, AJCC 8th Edition - Clinical stage from 07/28/2018: Stage IVB (cTX, cNX, pM1b) - Signed by Truitt Merle, MD on 08/18/2018    08/04/2018 Initial Diagnosis    Rectal cancer metastasized to liver ([place])    08/16/2018 Pathology Results    Biospy  Diagnosis 08/16/18 Liver, needle/core biopsy - ADENOCARCINOMA. Microscopic Comment By morphology, the findings  are compatible with the provided clinical history of colorectal adenocarcinoma. Results reported to [name] on 08/18/2018. Intradepartmental consultation was obtained ([name]).     08/16/2018 Miscellaneous    Foundation 1 genomic testing: MS-stable Tumor mutation burden  3/Mb K-ras/NRAS wild-type BRAF V600 E KDMSA R266Q PTEN T343f* SMAD4 loss TP53 loss      08/18/2018 Procedure    Colonoscopy by Dr. HBenson Norwayshowed malignant appearing partially obstructing tumor in the rectum, 8 cm above anal verge.    08/18/2018 - 09/07/2018 Chemotherapy    CAPOX q3weeks with Xeloda 2006mBID 2 weeks on, 1 weeks off starting 08/18/18.  Swithced treatment due to BRAF mutation.     09/07/2018 - 12/22/2018 Chemotherapy    FOLFOXIRI and avastin every 2 weeks starting 09/07/18. Oxaliplatin held starting with cycle 8 and continue with FOLFIRI and Avastin. D/c due to disease progression.     11/04/2018 Imaging    CT CAP 11/04/18 IMPRESSION: 1. Peripheral filling defect in the right main pulmonary artery extending primarily into the right lower lobe pulmonary artery. While the peripheral location indicates some degree of chronicity of this pulmonary embolus, this is a new finding compared to 07/28/2018. The right ventricular to left ventricular ratio is 0.7, which does not specifically indicate right heart strain. 2. The malignancy appears considerably improved, with reduced size pulmonary nodules; reduced size and increased internal necrosis of the hepatic metastatic lesions; marked improvement in the thoracic adenopathy; reduced conspicuity of the rectal tumor; and significant reduction in perirectal tumor burden/adenopathy. 3. Other imaging findings of potential clinical significance: Aortic Atherosclerosis (ICD10-I70.0). Coronary atherosclerosis. Bilateral renal cysts. Small umbilical hernia contains adipose tissue.  Critical Value/emergent results were called by telephone at the time of  interpretation on 11/04/2018 at 3:05 pm to [name] , who verbally acknowledged these results.    01/18/2019 Imaging    CT AP W contrast  IMPRESSION: 1. Colonic obstruction resulting from rectal carcinoma. 2. Hepatic metastatic disease which is decreased in size compared with the prior examination of 11/04/2018. 3. Multiple right middle lobe and right lower lobe pulmonary nodules most consistent with metastatic disease.    02/01/2019 -  Chemotherapy    second line BRAFTOVI(encorafenib) 316m daily and MEKTOVI(binimetinib), 424mq12h and Vectibix q2weeks starting 02/01/19      CURRENT THERAPY:  -Second lineBRAFTOVI(encorafenib)30033mailyand MEKTOVI(binimetinib), 53m57m2hand Vectibix q2weeks starting 02/01/19 -Plan to start Radiation week of 5/18  INTERVAL HISTORY:  Billy Ausbornhere for a follow up of treatment. He was able to identify himself by birth date. He notes he is doing well. He feels he is doing well on oral medications he started last week. He denies any major side effects. He has an appetite denies skin rash. He plans to have CT scan today. He notes his weight is stable.  He notes he has been able to go to work and he notes he works 8-9 hours a day so he goes home early and nap. He notes he mowed the yard 5 days ago and leaf blowing. He notes he has been going for walks.    REVIEW OF SYSTEMS:   Constitutional: Denies fevers, chills or abnormal weight loss Eyes: Denies blurriness of vision Ears, nose, mouth, throat, and face: Denies mucositis or sore throat Respiratory: Denies cough, dyspnea or wheezes Cardiovascular: Denies palpitation, chest discomfort or lower extremity swelling Gastrointestinal:  Denies nausea, heartburn or change in bowel habits Skin: Denies abnormal skin rashes Lymphatics: Denies new lymphadenopathy or easy bruising Neurological:Denies numbness, tingling or new weaknesses Behavioral/Psych: Mood is stable, no new changes  All other  systems were reviewed with the patient and are negative.  MEDICAL HISTORY:  Past Medical History:  Diagnosis Date  . [facility]  SURGICAL HISTORY: Past Surgical History:  Procedure Laterality Date  . COLONIC STENT PLACEMENT N/A 01/19/2019   Procedure: COLONIC STENT PLACEMENT;  Surgeon: HungCarol Ada;  Location: [facility];  Service: Endoscopy;  Laterality: N/A;  . FLEXIBLE SIGMOIDOSCOPY N/A 01/19/2019   Procedure: FLEXIBLE SIGMOIDOSCOPY;  Surgeon: HungCarol Ada;  Location: [facility];  Service: Endoscopy;  Laterality: N/A;  . IR IMAGING GUIDED PORT INSERTION  08/16/2018  . IR US G[place]DE BX ASP/DRAIN  08/16/2018    I have reviewed the social history and family history with the patient and they are unchanged from previous note.  ALLERGIES:  has No Known Allergies.  MEDICATIONS:  Current Outpatient Medications  Medication Sig Dispense Refill  . acetaminophen (TYLENOL) 325 MG tablet Take 650 mg by mouth every 6 (six) hours as needed for moderate pain.    . amMarland KitchenODipine (NORVASC) 5 MG tablet TAKE 1 TABLET(5 MG) BY MOUTH TWICE DAILY 60 tablet 2  . Binimetinib (MEKTOVI) 15 MG TABS Take 45 mg by mouth 2 (two) times a day. (Patient not taking: Reported on 01/26/2019) 180 tablet 3  . clindamycin (CLINDAGEL) 1 % gel Apply topically 2 (two) times daily. 60 g 0  . diphenoxylate-atropine (LOMOTIL) 2.5-0.025 MG tablet Take 1-2 tablets by mouth 4 (four) times daily as needed for diarrhea or loose stools. Up to 8 tabs daily for severe diarrhea (Patient not taking: Reported on 01/26/2019) 60  tablet 2  . Encorafenib (BRAFTOVI) 75 MG CAPS Take 300 mg by mouth daily. (Patient not taking: Reported on 01/26/2019) 120 capsule 3  . potassium chloride SA (K-DUR) 20 MEQ tablet TAKE 1 TABLET(20 MEQ) BY MOUTH DAILY 30 tablet 1  . rivaroxaban (XARELTO) 20 MG TABS tablet Take 1 tablet (20 mg total) by mouth daily with supper. 30 tablet 5  . VITAMIN E PO Take 1 tablet by mouth daily.     No current  facility-administered medications for this visit.     PHYSICAL EXAMINATION: ECOG PERFORMANCE STATUS: 1 - Symptomatic but completely ambulatory  No vitals taken today, Exam not performed today  LABORATORY DATA:  I have reviewed the data as listed CBC Latest Ref Rng & Units 02/01/2019 01/20/2019 01/19/2019  WBC 4.0 - 10.5 K/uL 8.7 4.8 6.2  Hemoglobin 13.0 - 17.0 g/dL 10.4(L) 8.3(L) 9.6(L)  Hematocrit 39.0 - 52.0 % 33.3(L) 26.6(L) 29.9(L)  Platelets 150 - 400 K/uL 256 167 177     CMP Latest Ref Rng & Units 02/01/2019 01/20/2019 01/19/2019  Glucose 70 - 99 mg/dL 98 105(H) 125(H)  BUN 8 - 23 mg/dL _0 Creatinine 0.61 - 1.24 mg/dL 0.92 0.73 0.71  Sodium 135 - 145 mmol/L 139 141 138  Potassium 3.5 - 5.1 mmol/L 4.3 3.3(L) 3.1(L)  Chloride 98 - 111 mmol/L 107 114(H) 110  CO2 22 - 32 mmol/L 21(L) 21(L) 20(L)  Calcium 8.9 - 10.3 mg/dL 9.2 7.9(L) 8.1(L)  Total Protein 6.5 - 8.1 g/dL 7.8 - -  Total Bilirubin 0.3 - 1.2 mg/dL 0.4 - -  Alkaline Phos 38 - 126 U/L 187(H) - -  AST 15 - 41 U/L 29 - -  ALT 0 - 44 U/L 26 - -      RADIOGRAPHIC STUDIES: I have personally reviewed the radiological images as listed and agreed with the findings in the report. No results found.   ASSESSMENT & PLAN:  Dominie Benedick is a 68 y.o. male with   1. Proximal rectal adenocarcinoma, metastatic to liver and likely tolungsandnodes, MSS,KRAS/NRAS wild type,BRAF V600E mutation (+) -He was diagnosed in10/2019.He completed 1 cycle of CAPOX before changing todose reducedFOLFOXIRI andAvastindue to BRAF mutation in his tumor. -Hismetastatic diseaseis not curablewith surgery, he will continue treatment for as long as he can tolerate to control disease. -Hehas beentoleratingFOLFOXIRI moderately well. Due to significant cytopenias, chemo was held on 01/04/2019 -He was hospitalized on 01/18/19 forbowel obstruction due to rectal cancer. He had stent placed by Dr. Benson Norway. His CT AP showed his known  rectaltumor andobstruction, liver mets have decreased in size and multiple new right lung nodules consistent with metastasis. -I have changed his chemo to second-line oral BRAF/MEK/EGFR inhibitors (Braftovi, Mektovi and Vectibix q2weeks) which he started on  02/01/19.  -He is tolerating his first week on treatment well with no major side effects. I discussed his symptoms can arise as the cycle continues. I encouraged him to contact clinic if he developers significant or unexpected side effects or dehydration or fever.  -Plan for CT chest to be done before next visit. -Plan to start radiation week of 5/18 -F/u in 1 week   2. Recent bowel obstruction from rectal tumor  -s/p stent placement by Dr. Benson Norway on 01/19/2019,he declined loop colostomybut agree to keep surgery as last resort.  -Continue to use Colace every day, and a laxative as needed to keep stool soft/loose, 1-2 times a day and avoid constipation  -He understands the high possibility of recurrent bowel  obstruction due to tumor progression -He has met with dietician. He will continue with low residual diet.  -I previously discussed with rad/onc Dr. Lisbeth Renshaw, who will offer short course (3 weeks)palliative radiation to shrink the tumor. This has been post-poned to Mid-May.  -I previously discussed diarrhea is common with this second-line regimen. If he develops diarrhea he will use Imodium and I will call in lomotil if needed. He will continue oral potassium.   3.History of right PE -His PE is likely related to his underlying metastatic colon cancer and chemotherapy, especially Avastin. -He is on Xarelto 20 mg daily and will continue indefinitely. Has occasionally epistaxis secondary to Avastin,thrombocytopenia and Xarelto.  4. Pancytopeniasecondarytochemotherapy -Will monitor, overall improved   5. HTN  -TakesAmlodipine -BPoverall controlled   6. Goal of care discussion -He is full code now -He understands his  disease is incurable,and the goal of therapy is palliative to prolong his life.    Plan -He is clinically doing well  -Continue Oral Braftovi and Mektovi, tolerating well  -Lab, flush, f/u with [place] and Vectibix in 1 weeks, will hold [facility] when he starts radiation   -he will like start RT the week of 5/18   No problem-specific Assessment & Plan notes found for this encounter.   No orders of the defined types were placed in this encounter.  I discussed the assessment and treatment plan with the patient. The patient was provided an opportunity to ask questions and all were answered. The patient agreed with the plan and demonstrated an understanding of the instructions.  The patient was advised to call back or seek an in-person evaluation if the symptoms worsen or if the condition fails to improve as anticipated.   I provided 10 minutes of non face-to-face telephone visit time during this encounter, and > 50% was spent counseling as documented under my assessment & plan.    Truitt Merle, MD 02/09/2019   I, Joslyn Devon, am acting as scribe for Truitt Merle, MD.   I have reviewed the above documentation for accuracy and completeness, and I agree with the above.

## 2019-02-09 ENCOUNTER — Inpatient Hospital Stay: Payer: BC Managed Care – PPO | Attending: Adult Health | Admitting: Hematology

## 2019-02-09 DIAGNOSIS — I10 Essential (primary) hypertension: Secondary | ICD-10-CM | POA: Diagnosis not present

## 2019-02-09 DIAGNOSIS — I2699 Other pulmonary embolism without acute cor pulmonale: Secondary | ICD-10-CM | POA: Diagnosis not present

## 2019-02-09 DIAGNOSIS — Z79899 Other long term (current) drug therapy: Secondary | ICD-10-CM

## 2019-02-09 DIAGNOSIS — C20 Malignant neoplasm of rectum: Secondary | ICD-10-CM | POA: Diagnosis not present

## 2019-02-09 DIAGNOSIS — C787 Secondary malignant neoplasm of liver and intrahepatic bile duct: Secondary | ICD-10-CM | POA: Diagnosis not present

## 2019-02-10 ENCOUNTER — Telehealth: Payer: Self-pay | Admitting: Hematology

## 2019-02-10 NOTE — Telephone Encounter (Signed)
No los per 5/7. °

## 2019-02-11 ENCOUNTER — Encounter: Payer: Self-pay | Admitting: Hematology

## 2019-02-13 ENCOUNTER — Other Ambulatory Visit: Payer: Self-pay

## 2019-02-13 ENCOUNTER — Ambulatory Visit
Admission: RE | Admit: 2019-02-13 | Discharge: 2019-02-13 | Disposition: A | Discharge status: Home / Self Care | Payer: BLUE CROSS/BLUE SHIELD | Source: Ambulatory Visit | Attending: Hematology | Admitting: Hematology

## 2019-02-13 DIAGNOSIS — C20 Malignant neoplasm of rectum: Secondary | ICD-10-CM

## 2019-02-14 ENCOUNTER — Telehealth: Payer: Self-pay | Admitting: Radiation Oncology

## 2019-02-14 ENCOUNTER — Telehealth: Payer: Self-pay | Admitting: *Deleted

## 2019-02-14 ENCOUNTER — Ambulatory Visit: Payer: BLUE CROSS/BLUE SHIELD | Admitting: Radiation Oncology

## 2019-02-14 ENCOUNTER — Telehealth: Payer: Self-pay | Admitting: Nurse Practitioner

## 2019-02-14 NOTE — Telephone Encounter (Signed)
I called Mr. Grzelak to f/u on phone call to my desk nurse. The patient reports feeling fine but has low stamina over the last few days. He is able to perform all ADLs with effort. He notes rectal bleeding that has been intermittent since his cancer diagnosis. Bleeding has increased some over last few days. He is on xarelto. He held his dose today. He cannot tell me the exact amount because it is mixed with stool. He had 2 lose BM yesterday and took 2 imodium. He had 1 lose BM today and took 2 imodium. He has not started lomotil yet. He is able to eat and drink without difficulty. Denies fever, chills, rectal pain. He canceled CT sim today because he was too tired. I offered him lab, f/u with me, and potential IV fluids today. He declined. He states he really does feel fine. I recommend to begin lomotil now and alternate with imodium. Hold xarelto today and tomorrow. Increase po intake and hydration. I offered to move his appts to 5/13, he wants to try to go to work tomorrow and declined. He will return for his scheduled appointments on 5/14 with lab, f/u, and infusion. CT sim was rescheduled to 5/14 as well. He is aware. He understands to call if him symptoms worsen or do not improve.  Cira Rue, NP  02/14/2019

## 2019-02-14 NOTE — Telephone Encounter (Signed)
Tried to call the patient's phone but did not receive an answer and was unable to leave a voicemail.  Called his wife's line and left a message letting her know that we were checking in on Billy Mendoza to make sure he was ok and asking for a return call.  Call back number left [phone]).  Will continue to follow as necessary.  Gloriajean Dell. Leonie Green, BSN

## 2019-02-14 NOTE — Telephone Encounter (Signed)
Please let pt hold oral chemo pills and Xarelto for now, please evaluate if he needs come in for lab and IVF today or tomorrow. He has OV and treatment scheduled for 5/14. Thanks   Truitt Merle MD

## 2019-02-14 NOTE — Telephone Encounter (Signed)
New Message:      Pt's wife called and stated her husband is too weak to come to his appt today for ct simulation and will call back when he is feeling better to reschedule.

## 2019-02-14 NOTE — Telephone Encounter (Signed)
Wife called reporting that pt is very weak, and had diarrhea which not resolved with Imodium.  Spoke with wife and was informed that pt is extremely weak, had diarrhea x 3 last night - took Imodium and was able to sleep throughout the night.  Had diarrhea x 2 this am, took Imodium.  Per wife, pt knows to take Lomotil if diarrhea returns.   Per Billy Mendoza, pt's stools were dark brown almost black, had blood with every diarrhea episode, and foul smelling.  Pt is very weak.  Wife had cancelled CT simulation appt today. Per wife, pt is still able to eat and drink ok, still A&O, denied shortness of breath.   Pt had missed Xarelto for couple days; however, he has taken all other medications including oral chemo meds. Billy Rakers, NP notified. Billy Mendoza's   Phone     [phone].

## 2019-02-16 ENCOUNTER — Ambulatory Visit: Payer: BLUE CROSS/BLUE SHIELD | Admitting: Hematology

## 2019-02-16 ENCOUNTER — Inpatient Hospital Stay: Payer: BC Managed Care – PPO

## 2019-02-16 ENCOUNTER — Other Ambulatory Visit: Payer: BLUE CROSS/BLUE SHIELD

## 2019-02-16 ENCOUNTER — Other Ambulatory Visit: Payer: Self-pay

## 2019-02-16 ENCOUNTER — Ambulatory Visit
Admission: RE | Admit: 2019-02-16 | Discharge: 2019-02-16 | Disposition: A | Discharge status: Home / Self Care | Payer: BLUE CROSS/BLUE SHIELD | Source: Ambulatory Visit | Attending: Radiation Oncology | Admitting: Radiation Oncology

## 2019-02-16 ENCOUNTER — Telehealth: Payer: Self-pay | Admitting: Nurse Practitioner

## 2019-02-16 ENCOUNTER — Inpatient Hospital Stay (HOSPITAL_BASED_OUTPATIENT_CLINIC_OR_DEPARTMENT_OTHER): Payer: BC Managed Care – PPO | Admitting: Nurse Practitioner

## 2019-02-16 ENCOUNTER — Ambulatory Visit: Payer: BLUE CROSS/BLUE SHIELD

## 2019-02-16 ENCOUNTER — Encounter: Payer: Self-pay | Admitting: Nurse Practitioner

## 2019-02-16 VITALS — BP 110/81 | HR 106 | Temp 98.2°F | Resp 18 | Ht 73.0 in | Wt 239.5 lb

## 2019-02-16 VITALS — HR 96

## 2019-02-16 DIAGNOSIS — C20 Malignant neoplasm of rectum: Secondary | ICD-10-CM | POA: Insufficient documentation

## 2019-02-16 DIAGNOSIS — C787 Secondary malignant neoplasm of liver and intrahepatic bile duct: Secondary | ICD-10-CM | POA: Insufficient documentation

## 2019-02-16 DIAGNOSIS — D122 Benign neoplasm of ascending colon: Secondary | ICD-10-CM | POA: Insufficient documentation

## 2019-02-16 DIAGNOSIS — I700 Atherosclerosis of aorta: Secondary | ICD-10-CM | POA: Diagnosis not present

## 2019-02-16 DIAGNOSIS — C7801 Secondary malignant neoplasm of right lung: Secondary | ICD-10-CM | POA: Diagnosis not present

## 2019-02-16 DIAGNOSIS — I2510 Atherosclerotic heart disease of native coronary artery without angina pectoris: Secondary | ICD-10-CM | POA: Insufficient documentation

## 2019-02-16 DIAGNOSIS — Z86711 Personal history of pulmonary embolism: Secondary | ICD-10-CM | POA: Insufficient documentation

## 2019-02-16 DIAGNOSIS — Z9221 Personal history of antineoplastic chemotherapy: Secondary | ICD-10-CM | POA: Diagnosis not present

## 2019-02-16 DIAGNOSIS — Z7901 Long term (current) use of anticoagulants: Secondary | ICD-10-CM | POA: Insufficient documentation

## 2019-02-16 DIAGNOSIS — Z79899 Other long term (current) drug therapy: Secondary | ICD-10-CM | POA: Insufficient documentation

## 2019-02-16 DIAGNOSIS — L271 Localized skin eruption due to drugs and medicaments taken internally: Secondary | ICD-10-CM | POA: Diagnosis not present

## 2019-02-16 DIAGNOSIS — D500 Iron deficiency anemia secondary to blood loss (chronic): Secondary | ICD-10-CM | POA: Diagnosis not present

## 2019-02-16 DIAGNOSIS — Z5112 Encounter for antineoplastic immunotherapy: Secondary | ICD-10-CM | POA: Diagnosis not present

## 2019-02-16 DIAGNOSIS — T50995A Adverse effect of other drugs, medicaments and biological substances, initial encounter: Secondary | ICD-10-CM | POA: Insufficient documentation

## 2019-02-16 DIAGNOSIS — I10 Essential (primary) hypertension: Secondary | ICD-10-CM | POA: Insufficient documentation

## 2019-02-16 DIAGNOSIS — R000 Tachycardia, unspecified: Secondary | ICD-10-CM | POA: Insufficient documentation

## 2019-02-16 DIAGNOSIS — I271 Kyphoscoliotic heart disease: Secondary | ICD-10-CM

## 2019-02-16 DIAGNOSIS — C7802 Secondary malignant neoplasm of left lung: Secondary | ICD-10-CM | POA: Insufficient documentation

## 2019-02-16 DIAGNOSIS — Z510 Encounter for antineoplastic radiation therapy: Secondary | ICD-10-CM | POA: Diagnosis not present

## 2019-02-16 DIAGNOSIS — R0609 Other forms of dyspnea: Secondary | ICD-10-CM | POA: Insufficient documentation

## 2019-02-16 DIAGNOSIS — Z95828 Presence of other vascular implants and grafts: Secondary | ICD-10-CM

## 2019-02-16 DIAGNOSIS — D61810 Antineoplastic chemotherapy induced pancytopenia: Secondary | ICD-10-CM | POA: Diagnosis not present

## 2019-02-16 DIAGNOSIS — R05 Cough: Secondary | ICD-10-CM | POA: Insufficient documentation

## 2019-02-16 DIAGNOSIS — L709 Acne, unspecified: Secondary | ICD-10-CM | POA: Insufficient documentation

## 2019-02-16 LAB — TOTAL PROTEIN, URINE DIPSTICK: Protein, ur: NEGATIVE mg/dL

## 2019-02-16 LAB — CBC WITH DIFFERENTIAL (CANCER CENTER ONLY)
Abs Immature Granulocytes: 0.06 10*3/uL (ref 0.00–0.07)
Basophils Absolute: 0.1 10*3/uL (ref 0.0–0.1)
Basophils Relative: 1 %
Eosinophils Absolute: 0.6 10*3/uL — ABNORMAL HIGH (ref 0.0–0.5)
Eosinophils Relative: 6 %
HCT: 25.9 % — ABNORMAL LOW (ref 39.0–52.0)
Hemoglobin: 8.1 g/dL — ABNORMAL LOW (ref 13.0–17.0)
Immature Granulocytes: 1 %
Lymphocytes Relative: 33 %
Lymphs Abs: 3.5 10*3/uL (ref 0.7–4.0)
MCH: 33.6 pg (ref 26.0–34.0)
MCHC: 31.3 g/dL (ref 30.0–36.0)
MCV: 107.5 fL — ABNORMAL HIGH (ref 80.0–100.0)
Monocytes Absolute: 1 10*3/uL (ref 0.1–1.0)
Monocytes Relative: 10 %
Neutro Abs: 5.4 10*3/uL (ref 1.7–7.7)
Neutrophils Relative %: 49 %
Platelet Count: 254 10*3/uL (ref 150–400)
RBC: 2.41 MIL/uL — ABNORMAL LOW (ref 4.22–5.81)
RDW: 15 % (ref 11.5–15.5)
WBC Count: 10.7 10*3/uL — ABNORMAL HIGH (ref 4.0–10.5)
nRBC: 0.2 % (ref 0.0–0.2)

## 2019-02-16 LAB — CMP (CANCER CENTER ONLY)
ALT: 17 U/L (ref 0–44)
AST: 19 U/L (ref 15–41)
Albumin: 3 g/dL — ABNORMAL LOW (ref 3.5–5.0)
Alkaline Phosphatase: 135 U/L — ABNORMAL HIGH (ref 38–126)
Anion gap: 11 (ref 5–15)
BUN: 16 mg/dL (ref 8–23)
CO2: 19 mmol/L — ABNORMAL LOW (ref 22–32)
Calcium: 8.4 mg/dL — ABNORMAL LOW (ref 8.9–10.3)
Chloride: 107 mmol/L (ref 98–111)
Creatinine: 0.96 mg/dL (ref 0.61–1.24)
GFR, Est AFR Am: >60 mL/min (ref >60)
GFR, Estimated: >60 mL/min (ref >60)
Glucose, Bld: 114 mg/dL — ABNORMAL HIGH (ref 70–99)
Potassium: 3.9 mmol/L (ref 3.5–5.1)
Sodium: 137 mmol/L (ref 135–145)
Total Bilirubin: 0.3 mg/dL (ref 0.3–1.2)
Total Protein: 7.1 g/dL (ref 6.5–8.1)

## 2019-02-16 LAB — IRON AND TIBC
Iron: 29 ug/dL — ABNORMAL LOW (ref 42–163)
Saturation Ratios: 8 % — ABNORMAL LOW (ref 20–55)
TIBC: 369 ug/dL (ref 202–409)
UIBC: 340 ug/dL (ref 117–376)

## 2019-02-16 LAB — MAGNESIUM: Magnesium: 1.7 mg/dL (ref 1.7–2.4)

## 2019-02-16 MED ORDER — SODIUM CHLORIDE 0.9% FLUSH
10.0000 mL | INTRAVENOUS | Status: DC | PRN
  Administered 2019-02-16: 12:00:00 10 mL
  Filled 2019-02-16: qty 10

## 2019-02-16 MED ORDER — SODIUM CHLORIDE 0.9 % IV SOLN
Freq: Once | INTRAVENOUS | Status: AC
  Administered 2019-02-16: 11:00:00 via INTRAVENOUS
  Filled 2019-02-16: qty 250

## 2019-02-16 MED ORDER — HEPARIN SOD (PORK) LOCK FLUSH 100 UNIT/ML IV SOLN
500.0000 [IU] | Freq: Once | INTRAVENOUS | Status: AC | PRN
  Administered 2019-02-16: 500 [IU]
  Filled 2019-02-16: qty 5

## 2019-02-16 MED ORDER — SODIUM CHLORIDE 0.9% FLUSH
10.0000 mL | INTRAVENOUS | Status: DC | PRN
  Administered 2019-02-16: 09:00:00 10 mL
  Filled 2019-02-16: qty 10

## 2019-02-16 MED ORDER — SODIUM CHLORIDE 0.9 % IV SOLN
5.4000 mg/kg | Freq: Once | INTRAVENOUS | Status: AC
  Administered 2019-02-16: 11:00:00 600 mg via INTRAVENOUS
  Filled 2019-02-16: qty 20

## 2019-02-16 MED ORDER — RIVAROXABAN 15 MG PO TABS: 15.0000 mg | ORAL_TABLET | Freq: Every day | ORAL | 1 refills | Status: DC

## 2019-02-16 MED ORDER — DOXYCYCLINE HYCLATE 100 MG PO TABS: 100.0000 mg | ORAL_TABLET | Freq: Two times a day (BID) | ORAL | 0 refills | Status: DC

## 2019-02-16 NOTE — Progress Notes (Addendum)
Billy Mendoza   [phone] [phone]   Clinic Follow up Note   Patient Care Team: Billy Proud, MD as PCP - General (Internal Medicine) Billy Merle, MD as Consulting Physician (Hematology) Billy Ada, MD as Consulting Physician (Gastroenterology) 02/16/2019  CHIEF COMPLAINT: f/u metastatic rectal cancer   SUMMARY OF ONCOLOGIC HISTORY: Oncology History   Cancer Staging Rectal cancer metastasized to liver [facility]) Staging form: Colon and Rectum, AJCC 8th Edition - Clinical stage from 07/28/2018: Stage IVB (cTX, cNX, pM1b) - Signed by Billy Merle, MD on 08/18/2018       Rectal cancer metastasized to liver ([place])   07/28/2018 Initial Biopsy    Biopsy 07/28/18 Final Microscopic Diagnosis A.Colon, Ascending, Polyp:  -Fragments of tubular adenoma -No high-grade dysplasia of malignancy.  B. Rectum, Mass, Polyp:  -Invasive adenocarcinoma, moderately - poorly differentiatred.     07/28/2018 Cancer Staging    Staging form: Colon and Rectum, AJCC 8th Edition - Clinical stage from 07/28/2018: Stage IVB (cTX, cNX, pM1b) - Signed by Billy Merle, MD on 08/18/2018    08/04/2018 Initial Diagnosis    Rectal cancer metastasized to liver ([place])    08/16/2018 Pathology Results    Biospy  Diagnosis 08/16/18 Liver, needle/core biopsy - ADENOCARCINOMA. Microscopic Comment By morphology, the findings are compatible with the provided clinical history of colorectal adenocarcinoma. Results reported to Dr. Truitt Mendoza on 08/18/2018. Intradepartmental consultation was obtained (Dr. Tresa Mendoza).     08/16/2018 Miscellaneous    Foundation 1 genomic testing: MS-stable Tumor mutation burden  3/Mb K-ras/NRAS wild-type BRAF V600 E KDMSA R266Q PTEN T352f* SMAD4 loss TP53 loss      08/18/2018 Procedure    Colonoscopy by Dr. HBenson Mendoza malignant appearing partially obstructing tumor in the rectum, 8 cm above anal verge.    08/18/2018 - 09/07/2018 Chemotherapy   CAPOX q3weeks with Xeloda 20083mBID 2 weeks on, 1 weeks off starting 08/18/18.  Swithced treatment due to BRAF mutation.     09/07/2018 - 12/22/2018 Chemotherapy    FOLFOXIRI and avastin every 2 weeks starting 09/07/18. Oxaliplatin held starting with cycle 8 and continue with FOLFIRI and Avastin. D/c due to disease progression.     11/04/2018 Imaging    CT CAP 11/04/18 IMPRESSION: 1. Peripheral filling defect in the right main pulmonary artery extending primarily into the right lower lobe pulmonary artery. While the peripheral location indicates some degree of chronicity of this pulmonary embolus, this is a new finding compared to 07/28/2018. The right ventricular to left ventricular ratio is 0.7, which does not specifically indicate right heart strain. 2. The malignancy appears considerably improved, with reduced size pulmonary nodules; reduced size and increased internal necrosis of the hepatic metastatic lesions; marked improvement in the thoracic adenopathy; reduced conspicuity of the rectal tumor; and significant reduction in perirectal tumor burden/adenopathy. 3. Other imaging findings of potential clinical significance: Aortic Atherosclerosis (ICD10-I70.0). Coronary atherosclerosis. Bilateral renal cysts. Small umbilical hernia contains adipose tissue.  Critical Value/emergent results were called by telephone at the time of interpretation on 11/04/2018 at 3:05 pm to Billy Mendoza who verbally acknowledged these results.    01/18/2019 Imaging    CT AP W contrast  IMPRESSION: 1. Colonic obstruction resulting from rectal carcinoma. 2. Hepatic metastatic disease which is decreased in size compared with the prior examination of 11/04/2018. 3. Multiple right middle lobe and right lower lobe pulmonary nodules most consistent with metastatic disease.    02/01/2019 -  Chemotherapy    second line BRAFTOVI(encorafenib) 30024maily  and MEKTOVI(binimetinib), 46m q12h and Vectibix q2weeks  starting 02/01/19     CURRENT THERAPY:  Second lineBRAFTOVI(encorafenib)3059mdailyand MEKTOVI(binimetinib), 4544m12hand Vectibix q2weeks starting 02/01/19  INTERVAL HISTORY: Mr. BieGraysonturns for follow up and cycle 2 as scheduled. He completed cycle 1 vect ibix on 02/01/19. He takes braftovi and mektovi daily since then. He felt well first 2 weeks after treatment but over the last week he noticed significant drop in his stamina which corresponded to recurrent rectal bleeding. He thinks he bled "quite a bit" with bowel movements intermittently. We spoke over the phone on 5/12 and I recommended he hold xarelto. Bleeding stopped in 24 hours after he stopped. His energy is improving some. He feels mildly short of breath with exertion. Eating and drinking well. Bowels fluctuate between lose and constipation. Takes miralax and imodium as needed. 1 day after first treatment he noticed a "visual trickery" with central brightness but then resolved. Over the last week his skin rash "errupted" over his nose and cheeks and down his neck.   REVIEW OF SYSTEMS:   Constitutional: Denies fevers, chills or abnormal weight loss (+) fatigue, improving  Eyes: Denies blurriness of vision (+) central brightness vision change x1 day after vectibix/mektovi/braftovi, resolved  Ears, nose, mouth, throat, and face: Denies mucositis or sore throat Respiratory: Denies cough or wheezes (+) DOE x1 week Cardiovascular: Denies palpitation, chest discomfort or lower extremity swelling Gastrointestinal:  Denies nausea, vomiting, heartburn or change in bowel habits. Denies rectal pain  (+) alternates with constipation and lose BM (+) GI bleeding on xarelto, resolved after holding doses  Skin: Denies hand/foot redness (+) skin rash to nose, cheeks, neck, chest  Lymphatics: Denies new lymphadenopathy or easy bruising Neurological:Denies numbness, tingling or new weaknesses Behavioral/Psych: Mood is stable, no new changes  All  other systems were reviewed with the patient and are negative.  MEDICAL HISTORY:  Past Medical History:  [facility]  [facility]   SURGICAL HISTORY: Past Surgical History:  Procedure Laterality Date  . COLONIC STENT PLACEMENT N/A 01/19/2019   Procedure: COLONIC STENT PLACEMENT;  Surgeon: HunCarol AdaD;  Location: [facility];  Service: Endoscopy;  Laterality: N/A;  . FLEXIBLE SIGMOIDOSCOPY N/A 01/19/2019   Procedure: FLEXIBLE SIGMOIDOSCOPY;  Surgeon: HunCarol AdaD;  Location: [facility] ENDOSCOPY;  Service: Endoscopy;  Laterality: N/A;  . IR IMAGING GUIDED PORT INSERTION  08/16/2018  . IR US [place] BX ASP/DRAIN  08/16/2018    I have reviewed the social history and family history with the patient and they are unchanged from previous note.  ALLERGIES:  has No Known Allergies.  MEDICATIONS:  Current Outpatient Medications  Medication Sig Dispense Refill  . acetaminophen (TYLENOL) 325 MG tablet Take 650 mg by mouth every 6 (six) hours as needed for moderate pain.    . aMarland KitchenLODipine (NORVASC) 5 MG tablet TAKE 1 TABLET(5 MG) BY MOUTH TWICE DAILY 60 tablet 2  . Binimetinib (MEKTOVI) 15 MG TABS Take 45 mg by mouth 2 (two) times a day. 180 tablet 3  . clindamycin (CLINDAGEL) 1 % gel Apply topically 2 (two) times daily. 60 g 0  . diphenoxylate-atropine (LOMOTIL) 2.5-0.025 MG tablet Take 1-2 tablets by mouth 4 (four) times daily as needed for diarrhea or loose stools. Up to 8 tabs daily for severe diarrhea 60 tablet 2  . Encorafenib (BRAFTOVI) 75 MG CAPS Take 300 mg by mouth daily. 120 capsule 3  . potassium chloride SA (K-DUR) 20 MEQ tablet TAKE 1 TABLET(20 MEQ) BY MOUTH  DAILY 30 tablet 1  . VITAMIN E PO Take 1 tablet by mouth daily.    Marland Kitchen doxycycline (VIBRA-TABS) 100 MG tablet Take 1 tablet (100 mg total) by mouth 2 (two) times daily. 60 tablet 0  . Rivaroxaban (XARELTO) 15 MG TABS tablet Take 1 tablet (15 mg total) by mouth daily with supper. 30 tablet 1   No current  facility-administered medications for this visit.    Facility-Administered Medications Ordered in Other Visits  Medication Dose Route Frequency Provider Last Rate Last Dose  . sodium chloride flush (NS) 0.9 % injection 10 mL  10 mL Intracatheter PRN Billy Merle, MD   10 mL at 02/16/19 1219    PHYSICAL EXAMINATION: ECOG PERFORMANCE STATUS: 1 - Symptomatic but completely ambulatory  Vitals:   02/16/19 1029 02/16/19 1031  BP: 107/77 110/81  Pulse:    Resp:    Temp:    SpO2:     Filed Weights   02/16/19 0926  Weight: 239 lb 8 oz (108.6 kg)    GENERAL:alert, no distress and comfortable SKIN: acne type skin rash to face, neck and chest, worse on nose and cheeks  EYES:  sclera clear OROPHARYNX:no thrush or ulcers  LYMPH:  no palpable cervical or supraclavicular lymphadenopathy LUNGS: clear to auscultation with normal breathing effort HEART: regular rate & rhythm, no lower extremity edema ABDOMEN:  normal bowel sounds NEURO: grossly intact Limited exam due to covid19 outbreak PAC without erythema   LABORATORY DATA:  I have reviewed the data as listed CBC Latest Ref Rng & Units 02/16/2019 02/01/2019 01/20/2019  WBC 4.0 - 10.5 K/uL 10.7(H) 8.7 4.8  Hemoglobin 13.0 - 17.0 g/dL 8.1(L) 10.4(L) 8.3(L)  Hematocrit 39.0 - 52.0 % 25.9(L) 33.3(L) 26.6(L)  Platelets 150 - 400 K/uL 254 256 167     CMP Latest Ref Rng & Units 02/16/2019 02/01/2019 01/20/2019  Glucose 70 - 99 mg/dL 114(H) 98 105(H)  BUN 8 - 23 mg/dL _0 Creatinine 0.61 - 1.24 mg/dL 0.96 0.92 0.73  Sodium 135 - 145 mmol/L 137 139 141  Potassium 3.5 - 5.1 mmol/L 3.9 4.3 3.3(L)  Chloride 98 - 111 mmol/L 107 107 114(H)  CO2 22 - 32 mmol/L 19(L) 21(L) 21(L)  Calcium 8.9 - 10.3 mg/dL 8.4(L) 9.2 7.9(L)  Total Protein 6.5 - 8.1 g/dL 7.1 7.8 -  Total Bilirubin 0.3 - 1.2 mg/dL 0.3 0.4 -  Alkaline Phos 38 - 126 U/L 135(H) 187(H) -  AST 15 - 41 U/L 19 29 -  ALT 0 - 44 U/L 17 26 -      RADIOGRAPHIC STUDIES: I have  personally reviewed the radiological images as listed and agreed with the findings in the report. No results found.   ASSESSMENT & PLAN: Billy Mendoza is a 68 y.o. male with   1. Proximal rectal adenocarcinoma, metastatic to liver and likely tolungsandnodes, MSS,KRAS/NRAS wild type,BRAF V600E mutation (+) -He was diagnosed in10/2019.He completed 1 cycle of CAPOX before changing todose reducedFOLFOXIRI andAvastindue to BRAF mutation in his tumor. -HetoleratedFOLFOXIRI moderately well. Due to significant cytopenias, chemo was held on 01/04/2019 -hospitalized on 01/18/19 forbowel obstruction due to rectal cancer. He had stent placed by Dr. Benson Norway.Marland KitchenHis CT AP showed his known rectaltumor andobstruction, liver mets have decreased in size and multiple new right lung nodules consistent with metastasis. -Dr. Burr Medico recommend the new combination of oral BRAF/MEK/EGFR inhibitors with Vectibix q2weeks, s/p cycle 1 on 02/01/19  -Mr. Kadlec appears stable today. He tolerated cycle 1 moderately well until he  developed severe fatigue complicated by rectal bleeding and anemia. Hgb 8.1 today. He has been able to recover well.  -he underwent CT chest on 02/13/19 to complete new staging and get new baseline, which shows mildly progressive pulmonary metastases. This was reviewed with the patient. Dr. Burr Medico recommends to continue with current triplet therapy  -po intake is adequate. VSS except tachycardic likely from anemia. CBC, CMP stable. Mg normal. Iron studies are low due to blood loss from rectal bleeding. Will give IV iron.  -proceed with cycle 2 vectibix and continue braftovi and mektovi. After dose on 02/19/19 he will hold mektovi for pending palliative course of RT. This was given to him in writing today. -we reviewed his side effects may worsen after starting RT, will f/u weekly, hold treatment if necessary  2. Acne-type skin rash -secondary to panitumumab -developed after cycle 1 -topicals did not  help much -will continue clindamycin gel BID and hydrocortisone and add doxycycline 100 mg BID, Rx sent   3. Recent bowel obstruction from rectal tumor  -s/p stent placement by Dr. Benson Norway on 01/19/2019,he declined loop colostomybut agree to keep surgery as last resort.  -On stool softeners and low residue diet.  -At high risk for recurrent bowel obstruction due to cancer progression.  -Bowels moving daily to every other day. Uses miralax and imodium as needed, as he fluctuates between constipation and lose BM. No evidence of recurrent bowel obstruction -We reviewed vectibix can cause diarrhea.  -Pending palliative radiation per Dr. Lisbeth Renshaw. CT sim scheduled today. We discussed he may experience more diarrhea with RT.  -Will follow him closely.   4. Rectal bleeding -likely related to tumor bleeding and anticoagulation (Xarelto 20 mg daily)  -has occurred intermittently since diagnosis, improves for short period after treatments -his bleeding recurred this week and he felt very fatigued, Hgb 8.1  -bleeding resolves after holding few doses of xarelto -will resume xarelto at reduced dose 15 mg daily at night -will monitor closely   5. Iron deficiency anemia -secondary to blood loss from tumor  -ser iron 29, TIBC 369, 8% saturation ratio, consistent with iron deficiency  -after reviewing risk and benefits of iron infusion, he agrees to IV Feraheme which we plan to give next week after insurance approves -He can start prenatal vitamin with iron once daily   6.History of right PE -likely related to his underlying metastatic colon cancer and chemotherapy, especially Avastin.  -He is on Xarelto 20 mg daily and will continue indefinitely.  -will decrease to 15 mg daily due to rectal bleeding, new Rx sent   7. Pancytopeniasecondarytochemotherapy  8. HTN - onAmlodipine -BP stable today 121/84  6. Goal of care discussion -treatment goal is palliative; full code now   Plan  -Labs, imaging reviewed by NP and Dr. Burr Medico  -Proceed with vectibix today (q2 weeks) and continue BRAFTOVI(encorafenib)315m dailyand MEKTOVI(binimetinib), 498mq12h -Hold mektovi after 5/17 dose for upcoming palliative radiation  -Orthostatic BP today  -IV Feraheme requires pre-auth, will schedule it to be done next week  -Can start prenatal vitamin for po iron support  -Changed xarelto from 20 mg daily to 15 mg daily due to rectal bleeding, new Rx sent -Begin doxycycline 100 mg BID for skin rash, Rx sent  -Plan to begin palliative RT on 5/20, will f/u weekly while on treatment  -consider to hold oral chemo while on RT if he has increased side effects    No problem-specific Assessment & Plan notes found for this encounter.  No orders of the defined types were placed in this encounter.  All questions were answered. The patient knows to call the clinic with any problems, questions or concerns. No barriers to learning was detected.     Alla Feeling, NP 02/16/19   Addendum   Pt returns for second dose Vectibix. He is tolerating Mektovi and Braftovi moderately well, will stop Mektovi after dose on 5/17, anticipating he starting radiation mid next week. Will continue Braftovi and Vectibix during radiation. We reviewed his medications again, and he voiced good understanding and agrees with the plan. Plan to see him weekly when he is on radiation.   Billy Mendoza  02/16/2019

## 2019-02-16 NOTE — Telephone Encounter (Signed)
Per 5/14 los Infusion on 5/21 should be feraheme (not vectibix).

## 2019-02-17 DIAGNOSIS — Z510 Encounter for antineoplastic radiation therapy: Secondary | ICD-10-CM | POA: Diagnosis not present

## 2019-02-20 ENCOUNTER — Telehealth: Payer: Self-pay | Admitting: Nurse Practitioner

## 2019-02-20 ENCOUNTER — Other Ambulatory Visit: Payer: Self-pay

## 2019-02-20 ENCOUNTER — Telehealth: Payer: Self-pay

## 2019-02-20 DIAGNOSIS — C787 Secondary malignant neoplasm of liver and intrahepatic bile duct: Secondary | ICD-10-CM

## 2019-02-20 DIAGNOSIS — C20 Malignant neoplasm of rectum: Secondary | ICD-10-CM

## 2019-02-20 MED ORDER — POTASSIUM CHLORIDE CRYS ER 20 MEQ PO TBCR: 20.0000 meq | EXTENDED_RELEASE_TABLET | Freq: Two times a day (BID) | ORAL | 3 refills | Status: DC

## 2019-02-20 NOTE — Telephone Encounter (Signed)
Patient's wife calls with several concerns.  BP has been running low he is currently taking Norvasc 5 mg BID, skipped this mornings dose 115/75 this am.  He is very weak she wants to schedule nutritionist phone visit (I have sent a scheduling message).  She requested refill on potassium (I have sent this in).  Also she had questions regarding whether he is getting an infusion on Thursday (looks like he is getting feraheme).

## 2019-02-20 NOTE — Telephone Encounter (Signed)
I called Mr. Cafarelli to discuss wife's concerns. He notes positional dizziness on standing and low BP around 100/62. He stopped amlodipine today and he felt a bit better. I recommend he continue to hold amlodipine. He drinks some water and milk throughout the day. I encouraged him to drink 8 cups of water per day. He can drink diluted gatorade as well. I recommend to hold mektovi and braftovi at least until he is evaluated this week. I will arrange lab, flush, f/u, and infusion to be moved to 5/20 if possible. I plan to add IVF to infusion this week for likely orthostatic hypotension. He agrees with the plan and verbalized back to me. Schedule message sent.  Cira Rue, NP  02/20/2019

## 2019-02-21 ENCOUNTER — Telehealth: Payer: Self-pay

## 2019-02-21 ENCOUNTER — Telehealth: Payer: Self-pay | Admitting: Hematology

## 2019-02-21 NOTE — Progress Notes (Signed)
Billy Mendoza   [phone] [phone]   Clinic Follow up Note   Patient Care Team: Billy Proud, MD as PCP - General (Internal Medicine) Billy Merle, MD as Consulting Physician (Hematology) Billy Ada, MD as Consulting Physician (Gastroenterology) 02/22/2019  CHIEF COMPLAINT: f/u rectal cancer   SUMMARY OF ONCOLOGIC HISTORY: Oncology History   Cancer Staging Rectal cancer metastasized to liver [facility]) Staging form: Colon and Rectum, AJCC 8th Edition - Clinical stage from 07/28/2018: Stage IVB (cTX, cNX, pM1b) - Signed by Billy Merle, MD on 08/18/2018       Rectal cancer metastasized to liver (Billy Mendoza)   07/28/2018 Initial Biopsy    Biopsy 07/28/18 Final Microscopic Diagnosis A.Colon, Ascending, Polyp:  -Fragments of tubular adenoma -No high-grade dysplasia of malignancy.  B. Rectum, Mass, Polyp:  -Invasive adenocarcinoma, moderately - poorly differentiatred.     07/28/2018 Cancer Staging    Staging form: Colon and Rectum, AJCC 8th Edition - Clinical stage from 07/28/2018: Stage IVB (cTX, cNX, pM1b) - Signed by Billy Merle, MD on 08/18/2018    08/04/2018 Initial Diagnosis    Rectal cancer metastasized to liver (Billy Mendoza)    08/16/2018 Pathology Results    Biospy  Diagnosis 08/16/18 Liver, needle/core biopsy - ADENOCARCINOMA. Microscopic Comment By morphology, the findings are compatible with the provided clinical history of colorectal adenocarcinoma. Results reported to Dr. Truitt Mendoza on 08/18/2018. Intradepartmental consultation was obtained (Billy Mendoza).     08/16/2018 Miscellaneous    Foundation 1 genomic testing: MS-stable Tumor mutation burden  3/Mb K-ras/NRAS wild-type BRAF V600 E KDMSA R266Q PTEN T370f* SMAD4 loss TP53 loss      08/18/2018 Procedure    Colonoscopy by Dr. HBenson Mendoza malignant appearing partially obstructing tumor in the rectum, 8 cm above anal verge.    08/18/2018 - 09/07/2018 Chemotherapy    CAPOX q3weeks  with Xeloda 20051mBID 2 weeks on, 1 weeks off starting 08/18/18.  Swithced treatment due to BRAF mutation.     09/07/2018 - 12/22/2018 Chemotherapy    FOLFOXIRI and avastin every 2 weeks starting 09/07/18. Oxaliplatin held starting with cycle 8 and continue with FOLFIRI and Avastin. D/c due to disease progression.     11/04/2018 Imaging    CT CAP 11/04/18 IMPRESSION: 1. Peripheral filling defect in the right main pulmonary artery extending primarily into the right lower lobe pulmonary artery. While the peripheral location indicates some degree of chronicity of this pulmonary embolus, this is a new finding compared to 07/28/2018. The right ventricular to left ventricular ratio is 0.7, which does not specifically indicate right heart strain. 2. The malignancy appears considerably improved, with reduced size pulmonary nodules; reduced size and increased internal necrosis of the hepatic metastatic lesions; marked improvement in the thoracic adenopathy; reduced conspicuity of the rectal tumor; and significant reduction in perirectal tumor burden/adenopathy. 3. Other imaging findings of potential clinical significance: Aortic Atherosclerosis (ICD10-I70.0). Coronary atherosclerosis. Bilateral renal cysts. Small umbilical hernia contains adipose tissue.  Critical Value/emergent results were called by telephone at the time of interpretation on 11/04/2018 at 3:05 pm to Dr. YATruitt Mendoza who verbally acknowledged these results.    01/18/2019 Imaging    CT AP W contrast  IMPRESSION: 1. Colonic obstruction resulting from rectal carcinoma. 2. Hepatic metastatic disease which is decreased in size compared with the prior examination of 11/04/2018. 3. Multiple right middle lobe and right lower lobe pulmonary nodules most consistent with metastatic disease.    02/01/2019 -  Chemotherapy    second line BRAFTOVI(encorafenib) 30075m  daily and MEKTOVI(binimetinib), 52m q12h and Vectibix q2weeks starting  02/01/19    02/22/2019 -  Radiation Therapy    -Palliative local radiation  -Holding mektovi (binimetinib) during RT     CURRENT THERAPY: Second lineBRAFTOVI(encorafenib)3049mdailyand MEKTOVI(binimetinib), 4544m12hand Vectibix q2weeks starting4/29/20 -Mektovi on hold since 02/19/19 for upcoming RT to begin today 5/20 -Braftovi held on today's visit   INTERVAL HISTORY: Billy Mendoza for follow-up.  He held MEKDousmannce 5/17 for upcoming RT and BRAFTOVI since 02/20/2019 per our phone conversation were he reported not drinking enough, positional dizziness, and low BP at home.  Has not taken amlodipine and dizziness is improving. No fall. He feels fine at rest. Continues to have low stamina and shortness of breath on exertion. He developed a mild intermittent cough over the last few days, he swallows the sputum. No fever, chills, chest pain. Denies leg edema. He completes ADLs with effort and requires rest after shower. Has normal BM daily to every-other-day. Has blood streaked rectal discharge but otherwise no rectal bleeding in 1.5 weeks. Taking lower dose xarelto 15 mg daily. Denies rectal pain. Denies n/v/c/d. Denies mouth sores. Skin rash is drying out on nose, he is taking antibiotics.    MEDICAL HISTORY:  Past Medical History:  Diagnosis [facility]   [facility]   SURGICAL HISTORY: Past Surgical History:  Procedure Laterality Date   COLONIC STENT PLACEMENT N/A 01/19/2019   Procedure: COLONIC STENT PLACEMENT;  Surgeon: HunCarol AdaD;  Location: [facility];  Service: Endoscopy;  Laterality: N/A;   FLEXIBLE SIGMOIDOSCOPY N/A 01/19/2019   Procedure: FLEXIBLE SIGMOIDOSCOPY;  Surgeon: HunCarol AdaD;  Location: [facility];  Service: Endoscopy;  Laterality: N/A;   IR IMAGING GUIDED PORT INSERTION  08/16/2018   IR US [place] BX ASP/DRAIN  08/16/2018    I have reviewed the social history and family history with the patient and they are unchanged from previous  note.  ALLERGIES:  has No Known Allergies.  MEDICATIONS:  Current Outpatient Medications  Medication Sig Dispense Refill   acetaminophen (TYLENOL) 325 MG tablet Take 650 mg by mouth every 6 (six) hours as needed for moderate pain.     amLODipine (NORVASC) 5 MG tablet TAKE 1 TABLET(5 MG) BY MOUTH TWICE DAILY 60 tablet 2   clindamycin (CLINDAGEL) 1 % gel Apply topically 2 (two) times daily. 60 g 0   diphenoxylate-atropine (LOMOTIL) 2.5-0.025 MG tablet Take 1-2 tablets by mouth 4 (four) times daily as needed for diarrhea or loose stools. Up to 8 tabs daily for severe diarrhea 60 tablet 2   doxycycline (VIBRA-TABS) 100 MG tablet Take 1 tablet (100 mg total) by mouth 2 (two) times daily. 60 tablet 0   potassium chloride SA (K-DUR) 20 MEQ tablet Take 1 tablet (20 mEq total) by mouth 2 (two) times daily. 60 tablet 3   Rivaroxaban (XARELTO) 15 MG TABS tablet Take 1 tablet (15 mg total) by mouth daily with supper. 30 tablet 1   VITAMIN E PO Take 1 tablet by mouth daily.     Binimetinib (MEKTOVI) 15 MG TABS Take 45 mg by mouth 2 (two) times a day. (Patient not taking: Reported on 02/22/2019) 180 tablet 3   Encorafenib (BRAFTOVI) 75 MG CAPS Take 300 mg by mouth daily. (Patient not taking: Reported on 02/22/2019) 120 capsule 3   Current Facility-Administered Medications  Medication Dose Route Frequency Provider Last Rate Last Dose   0.9 %  sodium chloride infusion   Intravenous Continuous BurAlla FeelingP  Stopped at 02/22/19 1147   Facility-Administered Medications Ordered in Other Visits  Medication Dose Route Frequency Provider Last Rate Last Dose   0.9 %  sodium chloride infusion   Intravenous Once Billy Merle, MD       alteplase (CATHFLO ACTIVASE) injection 2 mg  2 mg Intracatheter Once PRN Billy Merle, MD       heparin lock flush 100 unit/mL  500 Units Intracatheter Once PRN Billy Merle, MD        PHYSICAL EXAMINATION: ECOG PERFORMANCE STATUS: 2-3   Vitals:   02/22/19 0837  02/22/19 0840  BP: 131/79 102/71  Pulse: (!) 114 (!) 131  Resp: (!) 22 (!) 22  Temp: 97.9 F (36.6 C)   SpO2: 100% 100%   Filed Weights   02/22/19 0837  Weight: 240 lb (108.9 kg)    GENERAL:alert, no distress and comfortable SKIN: acneform rash to face particularly on nose, neck, and chest  EYES: sclera clear LUNGS: clear to auscultation, with normal breathing effort HEART: tachycardic, regular rhythm. no lower extremity edema Musculoskeletal:no cyanosis of digits and no clubbing  NEURO: alert & oriented x 3 with fluent speech PAC without erythema Limited exam for covid19 outbreak  LABORATORY DATA:  I have reviewed the data as listed CBC Latest Ref Rng & Units 02/22/2019 02/16/2019 02/01/2019  WBC 4.0 - 10.5 K/uL 7.4 10.7(H) 8.7  Hemoglobin 13.0 - 17.0 g/dL 7.8(L) 8.1(L) 10.4(L)  Hematocrit 39.0 - 52.0 % 26.2(L) 25.9(L) 33.3(L)  Platelets 150 - 400 K/uL 224 254 256     CMP Latest Ref Rng & Units 02/22/2019 02/16/2019 02/01/2019  Glucose 70 - 99 mg/dL 131(H) 114(H) 98  BUN 8 - 23 mg/dL _0 Creatinine 0.61 - 1.24 mg/dL 1.04 0.96 0.92  Sodium 135 - 145 mmol/L 140 137 139  Potassium 3.5 - 5.1 mmol/L 3.6 3.9 4.3  Chloride 98 - 111 mmol/L 109 107 107  CO2 22 - 32 mmol/L 21(L) 19(L) 21(L)  Calcium 8.9 - 10.3 mg/dL 9.1 8.4(L) 9.2  Total Protein 6.5 - 8.1 g/dL 6.7 7.1 7.8  Total Bilirubin 0.3 - 1.2 mg/dL 0.2(L) 0.3 0.4  Alkaline Phos 38 - 126 U/L 126 135(H) 187(H)  AST 15 - 41 U/L _1 ALT 0 - 44 U/L _2 RADIOGRAPHIC STUDIES: I have personally reviewed the radiological images as listed and agreed with the findings in the report. No results found.   ASSESSMENT & PLAN: Billy Mendoza a 68 y.o.malewith   1. Proximal rectal adenocarcinoma, metastatic to liver and likely tolungsandnodes, MSS,KRAS/NRAS wild type,BRAF V600E mutation (+) -Billy Mendoza appears stable. S/p cycle 2 vectibix on 02/16/19. His fatigue has not improved much, and he has dyspnea  on exertion. VS show orthostatic hypotension and tachycardia.  -Labs reviewed, Hgb 7.8, otherwise stable -Mektovi is on hold for palliative local RT which will begin today. I advised him to hold Braftovi as well for symptomatic anemia, fatigue, and dehydration. -He will get 1 L IVF over 2 hours today and first dose IV feraheme. Plan to give 2nd dose next week  -VS improved after hydration -He is taking [place] tail supplement; I discussed with our pharmacy who recommend he discontinue this due to potential effects on the liver. He understands.  -f/u next week with next cycle vectibix and to determine whether he can restart Braftovi. I reviewed the plan with Dr. Burr Medico    2. Acne-type skin rash -secondary to panitumumab -developed after cycle  1 -topicals did not help much -will continue clindamycin gel BID and hydrocortisone and add doxycycline 100 mg BID -rash is drying out some, stable   3.Recent bowel obstruction from rectal tumor  -s/p stent placement by Dr. Benson Norway on 01/19/2019,he declined loop colostomybut agree to keep surgery as last resort.  -On stool softeners and low residue diet.  -At high risk for recurrent bowel obstruction due to cancer progression.  -Bowels moving daily to every other day. Uses miralax and imodium as needed, as he fluctuates between constipation and lose BM. No evidence of recurrent bowel obstruction -We reviewed vectibix can cause diarrhea.  -Palliative radiation per Dr. Lisbeth Renshaw to start today. We previously discussed he may experience more diarrhea with RT.  -Will follow him closely.   4. Rectal bleeding -likely related to tumor bleeding and anticoagulation (Xarelto 20 mg daily) - bleeding improved with lower dose  -has occurred intermittently since diagnosis, improves for short period after treatments -mostly just blood streaked rectal discharge, no large amount of bleeding in over 1 week  5. Iron deficiency anemia -secondary to blood loss from tumor   -ser iron 29, TIBC 369, 8% saturation ratio, consistent with iron deficiency  -He can start prenatal vitamin with iron once daily  -Will get first dose IV Feraheme today. Given his Hgb dropped to 7.8 and he is symptomatic, will get 2nd dose next week   6.History of right PE -likely related to his underlying metastatic colon cancer and chemotherapy, especially Avastin.  -bleeding improved with lower dose xarelto 15 mg daily   7. Pancytopeniasecondarytochemotherapy -worsening anemia, Hgb 7.8 today; secondary to treatment and rectal bleeding  -WBC and PLT normal -due to blood shortage in COVID19 outbreak, he will hold braftovi and mektovi and receive IV Feraheme today and 2nd dose in 1 week   8. HTN - onAmlodipine -evidence of orthostatic hypotension recently in clinic. Holding amlodipine -received 1 liter NS today. He monitors BP at home closely   6. Goal of care discussion -treatment goal is palliative; full code now   PLAN: -Labs, VS reviewed  -IVF and IV Feraheme today; VS improved after hydration -Hold Mektovi for RT -Hold Braftovi until next f/u  -Reviewed with pharmacy, recommend to d/c [place] tail supplement  -F/u weekly on treatment -next cycle vectibix in 1 week  -RT start today   All questions were answered. The patient knows to call the clinic with any problems, questions or concerns. No barriers to learning was detected. I spent 20 counseling the patient face to face. The total time spent in the appointment was 25 minutes and more than 50% was on counseling and review of test results     Alla Feeling, NP 02/22/19

## 2019-02-21 NOTE — Telephone Encounter (Signed)
Message left on Pt's V/M in reference to appointment changed to tomorrow 02/22/19 appointment times left on Pt's V/M Pt. Informed to return call to confirm he received message.

## 2019-02-21 NOTE — Telephone Encounter (Signed)
Called pt - unable to reach pt. Left message with new appt date and time

## 2019-02-21 NOTE — Telephone Encounter (Signed)
-----   Message from [facility] sent at 02/21/2019 12:18 PM EDT ----- Regarding: FW: add on We rescheduled the appts . I tried to call pt. - unable to reach and left a message for him. Someone may need to call him again and follow up to be sure he comes in tomorrow  ----- Message ----- From: Gardiner Rhyme, RN Sent: 02/21/2019  12:12 PM EDT To: Nicholaus Corolla Subject: RE: add on                                     [place], I moved all appts. Can you call him? Thanks! Melanie ----- Message ----- From: Nicholaus Corolla Sent: 02/21/2019  10:00 AM EDT To: Nicholaus Corolla, Gardiner Rhyme, RN Subject: add on                                         Scheduling Message  Entered by Alla Feeling on 02/20/2019 at 4:57 PM Priority: High  <No visit type provided> Department: [facility] Provider:  Scheduling Notes: please move lab, flush, f/u LB, infusion from 5/21 to 5/20 if possible. please call patient. infusion time to include IVF and feraheme.  Thanks, Lacie NP   Can we move appt ?

## 2019-02-22 ENCOUNTER — Inpatient Hospital Stay: Payer: BC Managed Care – PPO

## 2019-02-22 ENCOUNTER — Ambulatory Visit
Admission: RE | Admit: 2019-02-22 | Discharge: 2019-02-22 | Disposition: A | Discharge status: Home / Self Care | Payer: BLUE CROSS/BLUE SHIELD | Source: Ambulatory Visit | Attending: Radiation Oncology | Admitting: Radiation Oncology

## 2019-02-22 ENCOUNTER — Other Ambulatory Visit: Payer: Self-pay

## 2019-02-22 ENCOUNTER — Inpatient Hospital Stay (HOSPITAL_BASED_OUTPATIENT_CLINIC_OR_DEPARTMENT_OTHER): Payer: BC Managed Care – PPO | Admitting: Nurse Practitioner

## 2019-02-22 ENCOUNTER — Encounter: Payer: Self-pay | Admitting: Nurse Practitioner

## 2019-02-22 ENCOUNTER — Telehealth: Payer: Self-pay | Admitting: Hematology

## 2019-02-22 VITALS — BP 125/85 | HR 104 | Resp 19

## 2019-02-22 VITALS — BP 102/71 | HR 131 | Temp 97.9°F | Resp 22 | Wt 240.0 lb

## 2019-02-22 DIAGNOSIS — I700 Atherosclerosis of aorta: Secondary | ICD-10-CM

## 2019-02-22 DIAGNOSIS — Z510 Encounter for antineoplastic radiation therapy: Secondary | ICD-10-CM | POA: Diagnosis not present

## 2019-02-22 DIAGNOSIS — D122 Benign neoplasm of ascending colon: Secondary | ICD-10-CM

## 2019-02-22 DIAGNOSIS — C7802 Secondary malignant neoplasm of left lung: Secondary | ICD-10-CM | POA: Diagnosis not present

## 2019-02-22 DIAGNOSIS — Z86711 Personal history of pulmonary embolism: Secondary | ICD-10-CM

## 2019-02-22 DIAGNOSIS — C20 Malignant neoplasm of rectum: Secondary | ICD-10-CM

## 2019-02-22 DIAGNOSIS — D500 Iron deficiency anemia secondary to blood loss (chronic): Secondary | ICD-10-CM

## 2019-02-22 DIAGNOSIS — C7801 Secondary malignant neoplasm of right lung: Secondary | ICD-10-CM | POA: Diagnosis not present

## 2019-02-22 DIAGNOSIS — T50995A Adverse effect of other drugs, medicaments and biological substances, initial encounter: Secondary | ICD-10-CM

## 2019-02-22 DIAGNOSIS — Z9221 Personal history of antineoplastic chemotherapy: Secondary | ICD-10-CM

## 2019-02-22 DIAGNOSIS — C787 Secondary malignant neoplasm of liver and intrahepatic bile duct: Secondary | ICD-10-CM

## 2019-02-22 DIAGNOSIS — Z95828 Presence of other vascular implants and grafts: Secondary | ICD-10-CM

## 2019-02-22 DIAGNOSIS — Z7901 Long term (current) use of anticoagulants: Secondary | ICD-10-CM

## 2019-02-22 DIAGNOSIS — I2510 Atherosclerotic heart disease of native coronary artery without angina pectoris: Secondary | ICD-10-CM

## 2019-02-22 DIAGNOSIS — Z5112 Encounter for antineoplastic immunotherapy: Secondary | ICD-10-CM | POA: Diagnosis not present

## 2019-02-22 DIAGNOSIS — L709 Acne, unspecified: Secondary | ICD-10-CM

## 2019-02-22 DIAGNOSIS — L271 Localized skin eruption due to drugs and medicaments taken internally: Secondary | ICD-10-CM

## 2019-02-22 DIAGNOSIS — R000 Tachycardia, unspecified: Secondary | ICD-10-CM

## 2019-02-22 DIAGNOSIS — R05 Cough: Secondary | ICD-10-CM

## 2019-02-22 DIAGNOSIS — I10 Essential (primary) hypertension: Secondary | ICD-10-CM

## 2019-02-22 DIAGNOSIS — D61810 Antineoplastic chemotherapy induced pancytopenia: Secondary | ICD-10-CM

## 2019-02-22 DIAGNOSIS — R0609 Other forms of dyspnea: Secondary | ICD-10-CM

## 2019-02-22 DIAGNOSIS — Z79899 Other long term (current) drug therapy: Secondary | ICD-10-CM

## 2019-02-22 LAB — CBC WITH DIFFERENTIAL (CANCER CENTER ONLY)
Abs Immature Granulocytes: 0.04 10*3/uL (ref 0.00–0.07)
Basophils Absolute: 0.1 10*3/uL (ref 0.0–0.1)
Basophils Relative: 1 %
Eosinophils Absolute: 0.4 10*3/uL (ref 0.0–0.5)
Eosinophils Relative: 5 %
HCT: 26.2 % — ABNORMAL LOW (ref 39.0–52.0)
Hemoglobin: 7.8 g/dL — ABNORMAL LOW (ref 13.0–17.0)
Immature Granulocytes: 1 %
Lymphocytes Relative: 24 %
Lymphs Abs: 1.8 10*3/uL (ref 0.7–4.0)
MCH: 31.2 pg (ref 26.0–34.0)
MCHC: 29.8 g/dL — ABNORMAL LOW (ref 30.0–36.0)
MCV: 104.8 fL — ABNORMAL HIGH (ref 80.0–100.0)
Monocytes Absolute: 1 10*3/uL (ref 0.1–1.0)
Monocytes Relative: 13 %
Neutro Abs: 4.2 10*3/uL (ref 1.7–7.7)
Neutrophils Relative %: 56 %
Platelet Count: 224 10*3/uL (ref 150–400)
RBC: 2.5 MIL/uL — ABNORMAL LOW (ref 4.22–5.81)
RDW: 15.7 % — ABNORMAL HIGH (ref 11.5–15.5)
WBC Count: 7.4 10*3/uL (ref 4.0–10.5)
nRBC: 0 % (ref 0.0–0.2)

## 2019-02-22 LAB — CMP (CANCER CENTER ONLY)
ALT: 16 U/L (ref 0–44)
AST: 15 U/L (ref 15–41)
Albumin: 2.8 g/dL — ABNORMAL LOW (ref 3.5–5.0)
Alkaline Phosphatase: 126 U/L (ref 38–126)
Anion gap: 10 (ref 5–15)
BUN: 16 mg/dL (ref 8–23)
CO2: 21 mmol/L — ABNORMAL LOW (ref 22–32)
Calcium: 9.1 mg/dL (ref 8.9–10.3)
Chloride: 109 mmol/L (ref 98–111)
Creatinine: 1.04 mg/dL (ref 0.61–1.24)
GFR, Est AFR Am: >60 mL/min (ref >60)
GFR, Estimated: >60 mL/min (ref >60)
Glucose, Bld: 131 mg/dL — ABNORMAL HIGH (ref 70–99)
Potassium: 3.6 mmol/L (ref 3.5–5.1)
Sodium: 140 mmol/L (ref 135–145)
Total Bilirubin: 0.2 mg/dL — ABNORMAL LOW (ref 0.3–1.2)
Total Protein: 6.7 g/dL (ref 6.5–8.1)

## 2019-02-22 MED ORDER — SODIUM CHLORIDE 0.9% FLUSH
10.0000 mL | INTRAVENOUS | Status: DC | PRN
  Administered 2019-02-22: 08:00:00 10 mL
  Filled 2019-02-22: qty 10

## 2019-02-22 MED ORDER — ALTEPLASE 2 MG IJ SOLR
2.0000 mg | Freq: Once | INTRAMUSCULAR | Status: DC | PRN
  Filled 2019-02-22: qty 2

## 2019-02-22 MED ORDER — SODIUM CHLORIDE 0.9 % IV SOLN
INTRAVENOUS | Status: DC
  Administered 2019-02-22: 09:00:00 via INTRAVENOUS
  Filled 2019-02-22 (×2): qty 250

## 2019-02-22 MED ORDER — SODIUM CHLORIDE 0.9% FLUSH
10.0000 mL | Freq: Once | INTRAVENOUS | Status: AC | PRN
  Administered 2019-02-22: 12:00:00 10 mL
  Filled 2019-02-22: qty 10

## 2019-02-22 MED ORDER — SODIUM CHLORIDE 0.9 % IV SOLN
510.0000 mg | Freq: Once | INTRAVENOUS | Status: AC
  Administered 2019-02-22: 510 mg via INTRAVENOUS
  Filled 2019-02-22: qty 17

## 2019-02-22 MED ORDER — SODIUM CHLORIDE 0.9 % IV SOLN
Freq: Once | INTRAVENOUS | Status: DC
  Filled 2019-02-22: qty 250

## 2019-02-22 MED ORDER — HEPARIN SOD (PORK) LOCK FLUSH 100 UNIT/ML IV SOLN
500.0000 [IU] | Freq: Once | INTRAVENOUS | Status: AC | PRN
  Administered 2019-02-22: 500 [IU]
  Filled 2019-02-22: qty 5

## 2019-02-22 MED ORDER — HEPARIN SOD (PORK) LOCK FLUSH 100 UNIT/ML IV SOLN
500.0000 [IU] | Freq: Once | INTRAVENOUS | Status: DC | PRN
  Filled 2019-02-22: qty 5

## 2019-02-22 NOTE — Patient Instructions (Signed)
Dehydration, Adult  Dehydration is when there is not enough fluid or water in your body. This happens when you lose more fluids than you take in. Dehydration can range from mild to very bad. It should be treated right away to keep it from getting very bad. Symptoms of mild dehydration may include:  Thirst.  Dry lips.  Slightly dry mouth.  Dry, warm skin.  Dizziness. Symptoms of moderate dehydration may include:  Very dry mouth.  Muscle cramps.  Dark pee (urine). Pee may be the color of tea.  Your body making less pee.  Your eyes making fewer tears.  Heartbeat that is uneven or faster than normal (palpitations).  Headache.  Light-headedness, especially when you stand up from sitting.  Fainting (syncope). Symptoms of very bad dehydration may include:  Changes in skin, such as: ? Cold and clammy skin. ? Blotchy (mottled) or pale skin. ? Skin that does not quickly return to normal after being lightly pinched and let go (poor skin turgor).  Changes in body fluids, such as: ? Feeling very thirsty. ? Your eyes making fewer tears. ? Not sweating when body temperature is high, such as in hot weather. ? Your body making very little pee.  Changes in vital signs, such as: ? Weak pulse. ? Pulse that is more than 100 beats a minute when you are sitting still. ? Fast breathing. ? Low blood pressure.  Other changes, such as: ? Sunken eyes. ? Cold hands and feet. ? Confusion. ? Lack of energy (lethargy). ? Trouble waking up from sleep. ? Short-term weight loss. ? Unconsciousness. Follow these instructions at home:   If told by your doctor, drink an ORS: ? Make an ORS by using instructions on the package. ? Start by drinking small amounts, about  cup (120 mL) every 5-10 minutes. ? Slowly drink more until you have had the amount that your doctor said to have.  Drink enough clear fluid to keep your pee clear or pale yellow. If you were told to drink an ORS, finish the  ORS first, then start slowly drinking clear fluids. Drink fluids such as: ? Water. Do not drink only water by itself. Doing that can make the salt (sodium) level in your body get too low (hyponatremia). ? Ice chips. ? Fruit juice that you have added water to (diluted). ? Low-calorie sports drinks.  Avoid: ? Alcohol. ? Drinks that have a lot of sugar. These include high-calorie sports drinks, fruit juice that does not have water added, and soda. ? Caffeine. ? Foods that are greasy or have a lot of fat or sugar.  Take over-the-counter and prescription medicines only as told by your doctor.  Do not take salt tablets. Doing that can make the salt level in your body get too high (hypernatremia).  Eat foods that have minerals (electrolytes). Examples include bananas, oranges, potatoes, tomatoes, and spinach.  Keep all follow-up visits as told by your doctor. This is important. Contact a doctor if:  You have belly (abdominal) pain that: ? Gets worse. ? Stays in one area (localizes).  You have a rash.  You have a stiff neck.  You get angry or annoyed more easily than normal (irritability).  You are more sleepy than normal.  You have a harder time waking up than normal.  You feel: ? Weak. ? Dizzy. ? Very thirsty.  You have peed (urinated) only a small amount of very dark pee during 6-8 hours. Get help right away if:  You have   symptoms of very bad dehydration.  You cannot drink fluids without throwing up (vomiting).  Your symptoms get worse with treatment.  You have a fever.  You have a very bad headache.  You are throwing up or having watery poop (diarrhea) and it: ? Gets worse. ? Does not go away.  You have blood or something green (bile) in your throw-up.  You have blood in your poop (stool). This may cause poop to look black and tarry.  You have not peed in 6-8 hours.  You pass out (faint).  Your heart rate when you are sitting still is more than 100 beats a  minute.  You have trouble breathing. This information is not intended to replace advice given to you by your health care provider. Make sure you discuss any questions you have with your health care provider. Document Released: 07/18/2009 Document Revised: 04/10/2016 Document Reviewed: 11/15/2015 Elsevier Interactive Patient Education  2019 [employer]vier Inc.  

## 2019-02-22 NOTE — Telephone Encounter (Signed)
Scheduled nutrition appt per sch msg. Called and left message for wife.

## 2019-02-23 ENCOUNTER — Ambulatory Visit
Admission: RE | Admit: 2019-02-23 | Discharge: 2019-02-23 | Disposition: A | Discharge status: Home / Self Care | Payer: BLUE CROSS/BLUE SHIELD | Source: Ambulatory Visit | Attending: Radiation Oncology | Admitting: Radiation Oncology

## 2019-02-23 ENCOUNTER — Inpatient Hospital Stay: Payer: BC Managed Care – PPO

## 2019-02-23 ENCOUNTER — Other Ambulatory Visit: Payer: Self-pay

## 2019-02-23 ENCOUNTER — Inpatient Hospital Stay: Payer: BC Managed Care – PPO | Admitting: Nurse Practitioner

## 2019-02-23 ENCOUNTER — Telehealth: Payer: Self-pay | Admitting: Nurse Practitioner

## 2019-02-23 DIAGNOSIS — Z510 Encounter for antineoplastic radiation therapy: Secondary | ICD-10-CM | POA: Diagnosis not present

## 2019-02-23 NOTE — Telephone Encounter (Signed)
Per 5/20 los Add IV Feraheme to 5/28 infusion.  Waiting to hear back from [facility] to see will it be okay to add time to the patient scheduled appt on 5/28.

## 2019-02-24 ENCOUNTER — Ambulatory Visit
Admission: RE | Admit: 2019-02-24 | Discharge: 2019-02-24 | Disposition: A | Discharge status: Home / Self Care | Payer: BLUE CROSS/BLUE SHIELD | Source: Ambulatory Visit | Attending: Radiation Oncology | Admitting: Radiation Oncology

## 2019-02-24 ENCOUNTER — Other Ambulatory Visit: Payer: Self-pay

## 2019-02-24 DIAGNOSIS — Z510 Encounter for antineoplastic radiation therapy: Secondary | ICD-10-CM | POA: Diagnosis not present

## 2019-02-28 ENCOUNTER — Other Ambulatory Visit: Payer: Self-pay

## 2019-02-28 ENCOUNTER — Ambulatory Visit
Admission: RE | Admit: 2019-02-28 | Discharge: 2019-02-28 | Disposition: A | Discharge status: Home / Self Care | Payer: BLUE CROSS/BLUE SHIELD | Source: Ambulatory Visit | Attending: Radiation Oncology | Admitting: Radiation Oncology

## 2019-02-28 DIAGNOSIS — Z510 Encounter for antineoplastic radiation therapy: Secondary | ICD-10-CM | POA: Diagnosis not present

## 2019-03-01 ENCOUNTER — Other Ambulatory Visit: Payer: Self-pay

## 2019-03-01 ENCOUNTER — Ambulatory Visit
Admission: RE | Admit: 2019-03-01 | Discharge: 2019-03-01 | Disposition: A | Discharge status: Home / Self Care | Payer: BLUE CROSS/BLUE SHIELD | Source: Ambulatory Visit | Attending: Radiation Oncology | Admitting: Radiation Oncology

## 2019-03-01 ENCOUNTER — Inpatient Hospital Stay: Payer: BC Managed Care – PPO | Admitting: Nutrition

## 2019-03-01 DIAGNOSIS — Z510 Encounter for antineoplastic radiation therapy: Secondary | ICD-10-CM | POA: Diagnosis not present

## 2019-03-01 NOTE — Progress Notes (Signed)
RD working remotely.  Contacted patient twice. He was unavailable. Left a message for him to return call with my contact information.

## 2019-03-01 NOTE — Progress Notes (Signed)
Billy Mendoza   [phone] Fax:([phone]) [phone]   Clinic Follow up Note   Patient Care Team: Merry Proud, MD as PCP - General (Internal Medicine) Truitt Merle, MD as Consulting Physician (Hematology) Carol Ada, MD as Consulting Physician (Gastroenterology)  Date of Service:  03/02/2019  CHIEF COMPLAINT: F/u of rectal cancer metastasized to liver  SUMMARY OF ONCOLOGIC HISTORY: Oncology History   Cancer Staging Rectal cancer metastasized to liver [facility]) Staging form: Colon and Rectum, AJCC 8th Edition - Clinical stage from 07/28/2018: Stage IVB (cTX, cNX, pM1b) - Signed by Truitt Merle, MD on 08/18/2018       Rectal cancer metastasized to liver ([place])   07/28/2018 Initial Biopsy    Biopsy 07/28/18 Final Microscopic Diagnosis A.Colon, Ascending, Polyp:  -Fragments of tubular adenoma -No high-grade dysplasia of malignancy.  B. Rectum, Mass, Polyp:  -Invasive adenocarcinoma, moderately - poorly differentiatred.     07/28/2018 Cancer Staging    Staging form: Colon and Rectum, AJCC 8th Edition - Clinical stage from 07/28/2018: Stage IVB (cTX, cNX, pM1b) - Signed by Truitt Merle, MD on 08/18/2018    08/04/2018 Initial Diagnosis    Rectal cancer metastasized to liver ([place])    08/16/2018 Pathology Results    Biospy  Diagnosis 08/16/18 Liver, needle/core biopsy - ADENOCARCINOMA. Microscopic Comment By morphology, the findings are compatible with the provided clinical history of colorectal adenocarcinoma. Results reported to [name] on 08/18/2018. Intradepartmental consultation was obtained ([name]).     08/16/2018 Miscellaneous    Foundation 1 genomic testing: MS-stable Tumor mutation burden  3/Mb K-ras/NRAS wild-type BRAF V600 E KDMSA R266Q PTEN T337f* SMAD4 loss TP53 loss      08/18/2018 Procedure    Colonoscopy by Dr. HBenson Norwayshowed malignant appearing partially obstructing tumor in the rectum, 8 cm above anal verge.    08/18/2018 - 09/07/2018 Chemotherapy    CAPOX q3weeks with Xeloda '2000mg'$  BID 2 weeks on, 1 weeks off starting 08/18/18.  Swithced treatment due to BRAF mutation.     09/07/2018 - 12/22/2018 Chemotherapy    FOLFOXIRI and avastin every 2 weeks starting 09/07/18. Oxaliplatin held starting with cycle 8 and continue with FOLFIRI and Avastin. D/c due to disease progression.     11/04/2018 Imaging    CT CAP 11/04/18 IMPRESSION: 1. Peripheral filling defect in the right main pulmonary artery extending primarily into the right lower lobe pulmonary artery. While the peripheral location indicates some degree of chronicity of this pulmonary embolus, this is a new finding compared to 07/28/2018. The right ventricular to left ventricular ratio is 0.7, which does not specifically indicate right heart strain. 2. The malignancy appears considerably improved, with reduced size pulmonary nodules; reduced size and increased internal necrosis of the hepatic metastatic lesions; marked improvement in the thoracic adenopathy; reduced conspicuity of the rectal tumor; and significant reduction in perirectal tumor burden/adenopathy. 3. Other imaging findings of potential clinical significance: Aortic Atherosclerosis (ICD10-I70.0). Coronary atherosclerosis. Bilateral renal cysts. Small umbilical hernia contains adipose tissue.  Critical Value/emergent results were called by telephone at the time of interpretation on 11/04/2018 at 3:05 pm to Dr. YTruitt Merle, who verbally acknowledged these results.    01/18/2019 Imaging    CT AP W contrast  IMPRESSION: 1. Colonic obstruction resulting from rectal carcinoma. 2. Hepatic metastatic disease which is decreased in size compared with the prior examination of 11/04/2018. 3. Multiple right middle lobe and right lower lobe pulmonary nodules most consistent with metastatic disease.    02/01/2019 -  Chemotherapy  second line BRAFTOVI(encorafenib) 37m daily and  MEKTOVI(binimetinib), 482mq12h and Vectibix q2weeks starting 02/01/19.  -[facility] to RT     02/13/2019 Imaging    CT Chest  IMPRESSION: Small bilateral pulmonary metastases, mildly progressive from January 2020.  Multifocal hepatic metastases, incompletely visualized, favored to be progressive from April 2020.  Aortic Atherosclerosis (ICD10-I70.0).     02/22/2019 -  Radiation Therapy    -Palliative local radiation  -Holding mektovi (binimetinib) during RT      CURRENT THERAPY:  -Second lineBRAFTOVI(encorafenib)30071mailyand MEKTOVI(binimetinib), 43m56m2hand Vectibix q2weeks starting4/29/20. Held MEKTOVI starting 02/19/19 due to RT.  -Radiation starting  02/22/19  INTERVAL HISTORY:  Billy Haveyhere for a follow up and treatment. He presents to the clinic alone. He notes he is doing well. He notes he feels weak. He notes last 2 days this has improved. He denies any rectal problems, pain or bleeding from radiation. Due to significant fatigue he also stopped BRAFTOVI for 3-4 days and restarted on 5/23. He is doing better now, manageable.  He notes his skin rash is improving of his face, scalp and chest. This is not really itching. He notes he is taking Xarelto have the time. He waits until his bleeding completely stops until he uses Xarelto.    REVIEW OF SYSTEMS:   Constitutional: Denies fevers, chills or abnormal weight loss (+) fatigue  Eyes: Denies blurriness of vision Ears, nose, mouth, throat, and face: Denies mucositis or sore throat Respiratory: Denies cough, dyspnea or wheezes Cardiovascular: Denies palpitation, chest discomfort or lower extremity swelling Gastrointestinal:  Denies nausea, heartburn or change in bowel habits Skin: (+) Skin rash of face, scalp, chest  Lymphatics: Denies new lymphadenopathy or easy bruising Neurological:Denies numbness, tingling or new weaknesses Behavioral/Psych: Mood is stable, no new changes   All other systems were reviewed with the patient and are negative.  MEDICAL HISTORY:  Past Medical History:  Diagnosis Date  [facility]  [facility]UR[facility]ICAL [facility]ORY: Past Surgical History:  Procedure Laterality Date  . COLONIC STENT PLACEMENT N/A 01/19/2019   Procedure: COLONIC STENT PLACEMENT;  Surgeon: HungCarol Ada;  Location: [facility];  Service: Endoscopy;  Laterality: N/A;  . FLEXIBLE SIGMOIDOSCOPY N/A 01/19/2019   Procedure: FLEXIBLE SIGMOIDOSCOPY;  Surgeon: HungCarol Ada;  Location: [facility];  Service: Endoscopy;  Laterality: N/A;  . IR IMAGING GUIDED PORT INSERTION  08/16/2018  . IR US G[place] BX ASP/DRAIN  08/16/2018    I have reviewed the social history and family history with the patient and they are unchanged from previous note.  ALLERGIES:  has No Known Allergies.  MEDICATIONS:  Current Outpatient Medications  Medication Sig Dispense Refill  . acetaminophen (TYLENOL) 325 MG tablet Take 650 mg by mouth every 6 (six) hours as needed for moderate pain.    . amMarland KitchenODipine (NORVASC) 5 MG tablet TAKE 1 TABLET(5 MG) BY MOUTH TWICE DAILY 60 tablet 2  . Binimetinib (MEKTOVI) 15 MG TABS Take 45 mg by mouth 2 (two) times a day. 180 tablet 3  . clindamycin (CLINDAGEL) 1 % gel Apply topically 2 (two) times daily. 60 g 0  . diphenoxylate-atropine (LOMOTIL) 2.5-0.025 MG tablet Take 1-2 tablets by mouth 4 (four) times daily as needed for diarrhea or loose stools. Up to 8 tabs daily for severe diarrhea 60 tablet 2  . doxycycline (VIBRA-TABS) 100 MG tablet Take 1 tablet (100 mg total) by mouth 2 (two) times daily. 60 tablet 0  . Encorafenib (  BRAFTOVI) 75 MG CAPS Take 300 mg by mouth daily. 120 capsule 3  . potassium chloride SA (K-DUR) 20 MEQ tablet Take 1 tablet (20 mEq total) by mouth 2 (two) times daily. 60 tablet 3  . Rivaroxaban (XARELTO) 15 MG TABS tablet Take 1 tablet (15 mg total) by mouth daily with supper. 30 tablet 1  . VITAMIN E PO Take 1 tablet by mouth daily.      No current facility-administered medications for this visit.     PHYSICAL EXAMINATION: ECOG PERFORMANCE STATUS: 2 - Symptomatic, <50% confined to bed  Vitals:   03/02/19 1253  BP: 127/79  Pulse: 96  Resp: 18  Temp: 98.7 F (37.1 C)  SpO2: 100%   Filed Weights   03/02/19 1253  Weight: 241 lb 3.2 oz (109.4 kg)    GENERAL:alert, no distress and comfortable SKIN: skin color, texture, turgor are normal (+) Skin rash of face, scalp, chest  EYES: normal, Conjunctiva are pink and non-injected, sclera clear  NECK: supple, thyroid normal size, non-tender, without nodularity LYMPH:  no palpable lymphadenopathy in the cervical, axillary  LUNGS: clear to auscultation and percussion with normal breathing effort HEART: regular rate & rhythm and no murmurs and no lower extremity edema ABDOMEN:abdomen soft, non-tender and normal bowel sounds Musculoskeletal:no cyanosis of digits and no clubbing  NEURO: alert & oriented x 3 with fluent speech, no focal motor/sensory deficits  LABORATORY DATA:  I have reviewed the data as listed CBC Latest Ref Rng & Units 03/02/2019 02/22/2019 02/16/2019  WBC 4.0 - 10.5 K/uL 4.8 7.4 10.7(H)  Hemoglobin 13.0 - 17.0 g/dL 8.5(L) 7.8(L) 8.1(L)  Hematocrit 39.0 - 52.0 % 28.3(L) 26.2(L) 25.9(L)  Platelets 150 - 400 K/uL 144(L) 224 254     CMP Latest Ref Rng & Units 03/02/2019 02/22/2019 02/16/2019  Glucose 70 - 99 mg/dL 101(H) 131(H) 114(H)  BUN 8 - 23 mg/dL _0 Creatinine 0.61 - 1.24 mg/dL 0.91 1.04 0.96  Sodium 135 - 145 mmol/L 139 140 137  Potassium 3.5 - 5.1 mmol/L 4.2 3.6 3.9  Chloride 98 - 111 mmol/L 111 109 107  CO2 22 - 32 mmol/L 21(L) 21(L) 19(L)  Calcium 8.9 - 10.3 mg/dL 8.2(L) 9.1 8.4(L)  Total Protein 6.5 - 8.1 g/dL 6.6 6.7 7.1  Total Bilirubin 0.3 - 1.2 mg/dL 0.4 0.2(L) 0.3  Alkaline Phos 38 - 126 U/L 114 126 135(H)  AST 15 - 41 U/L _1 ALT 0 - 44 U/L _2 RADIOGRAPHIC STUDIES: I have personally reviewed the  radiological images as listed and agreed with the findings in the report. No results found.   ASSESSMENT & PLAN:  Billy Mendoza is a 68 y.o. male with   1. Proximal rectal adenocarcinoma, metastatic to liver and likely tolungsandnodes, MSS,KRAS/NRAS wild type,BRAF V600E mutation (+) -He was diagnosed in10/2019.He completed 1 cycle of CAPOX before changing todose reducedFOLFOXIRI andAvastindue to BRAF mutation in his tumor. -Hismetastatic diseaseis not curablewith surgery, he will continue treatment for as long as he can tolerate to control disease. -Hehas beentoleratingFOLFOXIRI moderately well. Due to significant cytopenias, chemo was held on 01/04/2019 -He was hospitalized on 01/18/19 forbowel obstruction due to rectal cancer. He had stent placed by Dr. Benson Norway. His CT AP showed his known rectaltumor andobstruction, liver mets have decreased in size and multiple new right lung nodules consistent with metastasis. -I have changed his chemo to second-lineoralBRAF/MEK/EGFR inhibitors(Braftovi, Mektovi and Vectibix q2weeks) which he started on 02/01/19. Tolerating  well.  -He has held [facility] since 02/19/19 and started Radiation on 02/22/19.  -Due to fatigue from RT and Braftovi, he held [facility] for 3-4 days and restarted on 02/25/19. He is functioning better, fatigue manageable.   -Labs reviewed, CBC WNL except Hg 8.5, plt 144K, CMP WNL. CEA, Ferritin, and urine protein still pending. Will give second dose of IV Feraheme today. Will proceed with Vectibix today  -Continue Braftovi and RT -F/u with NP Lacie next week  2.Recent bowel obstruction from rectal tumor  -s/p stent placement by Dr. Benson Norway on 01/19/2019,he declined loop colostomybut agree to keep surgery as last resort.  -Continueto use Colace every day, and a laxative as needed to keep stool soft/loose, 1-2 times a day and avoid constipation  -He understands thehigh possibility of recurrent bowel obstruction due to tumor  progression -He has met with dietician. He will continue with low residual diet. -He has started short course Palliative radiation with Dr Lisbeth Renshaw 02/22/19-03/15/19.  -I previously discussed diarrhea is common with this second-line regimen. If he develops diarrhea he will use Imodium and I will call in lomotil if needed. He will continue oral potassium.  3. Rectal bleeding -likely related to tumor bleeding and anticoagulation (Xarelto 20 mg daily) - bleeding improved with lower dose, he is on 108m daily now   -Has occurred intermittently since diagnosis, improves for short period after treatments -He will hold Xarelto for moderate bleeding with clots. For mild bleeding he can continue Xarelto.   4. Iron deficiency anemia -secondary to blood loss from tumor  -ser iron 29, TIBC 369, 8% saturation ratio, consistent with iron deficiency  -He can start prenatal vitamin with iron once daily  -he received first dose IV Feraheme on 02/22/19. Hg at 8.5 today, will receive 2nd dose today. (03/02/19).   5.History of right PE -His PE is likely related to his underlying metastatic colon cancer and chemotherapy, especially Avastin. -He is on Xarelto 20 mg daily and will continue indefinitely. Has occasionally epistaxis secondary to Avastin,thrombocytopenia and Xarelto.  6. Pancytopeniasecondarytochemotherapy -Will monitor, overall improved -Labs reviewed, Hg 8.5, plt 144K today (03/02/19)  7. HTN  -TakesAmlodipine -BPoverall controlled    8. Acne-type skin rash -secondary to Panitumumab, developed after cycle 1 -will continue clindamycin gel BID and hydrocortisone and add doxycycline 100 mg BID -rash is drying out some, intermittent with infusion.    9. Goal of care discussion -He is full code now -He understands his disease is incurable,and the goal of therapy is palliative to prolong his life.   Plan -Continue Oral Braftovi and RT, proceed victibix today and continue every  2 weeks  -lab, flush, f/u with NP Lacie in 1 week, with infusion appointment for IVF as needed    No problem-specific Assessment & Plan notes found for this encounter.   No orders of the defined types were placed in this encounter.  All questions were answered. The patient knows to call the clinic with any problems, questions or concerns. No barriers to learning was detected. I spent 20 minutes counseling the patient face to face. The total time spent in the appointment was 25 minutes and more than 50% was on counseling and review of test results     YTruitt Merle MD 03/02/2019   I, AJoslyn Devon am acting as scribe for YTruitt Merle MD.   I have reviewed the above documentation for accuracy and completeness, and I agree with the above.

## 2019-03-02 ENCOUNTER — Telehealth: Payer: Self-pay

## 2019-03-02 ENCOUNTER — Inpatient Hospital Stay: Payer: BC Managed Care – PPO

## 2019-03-02 ENCOUNTER — Encounter: Payer: Self-pay | Admitting: Hematology

## 2019-03-02 ENCOUNTER — Inpatient Hospital Stay (HOSPITAL_BASED_OUTPATIENT_CLINIC_OR_DEPARTMENT_OTHER): Payer: BC Managed Care – PPO | Admitting: Hematology

## 2019-03-02 ENCOUNTER — Other Ambulatory Visit: Payer: Self-pay

## 2019-03-02 ENCOUNTER — Ambulatory Visit: Payer: BLUE CROSS/BLUE SHIELD

## 2019-03-02 ENCOUNTER — Ambulatory Visit
Admission: RE | Admit: 2019-03-02 | Discharge: 2019-03-02 | Disposition: A | Discharge status: Home / Self Care | Payer: BLUE CROSS/BLUE SHIELD | Source: Ambulatory Visit | Attending: Radiation Oncology | Admitting: Radiation Oncology

## 2019-03-02 ENCOUNTER — Telehealth: Payer: Self-pay | Admitting: Hematology

## 2019-03-02 VITALS — BP 127/79 | HR 96 | Temp 98.7°F | Resp 18 | Ht 73.0 in | Wt 241.2 lb

## 2019-03-02 DIAGNOSIS — C20 Malignant neoplasm of rectum: Secondary | ICD-10-CM

## 2019-03-02 DIAGNOSIS — Z86711 Personal history of pulmonary embolism: Secondary | ICD-10-CM

## 2019-03-02 DIAGNOSIS — D500 Iron deficiency anemia secondary to blood loss (chronic): Secondary | ICD-10-CM | POA: Diagnosis not present

## 2019-03-02 DIAGNOSIS — R000 Tachycardia, unspecified: Secondary | ICD-10-CM

## 2019-03-02 DIAGNOSIS — Z510 Encounter for antineoplastic radiation therapy: Secondary | ICD-10-CM | POA: Diagnosis not present

## 2019-03-02 DIAGNOSIS — R05 Cough: Secondary | ICD-10-CM

## 2019-03-02 DIAGNOSIS — Z9221 Personal history of antineoplastic chemotherapy: Secondary | ICD-10-CM

## 2019-03-02 DIAGNOSIS — T50995A Adverse effect of other drugs, medicaments and biological substances, initial encounter: Secondary | ICD-10-CM

## 2019-03-02 DIAGNOSIS — L709 Acne, unspecified: Secondary | ICD-10-CM

## 2019-03-02 DIAGNOSIS — L271 Localized skin eruption due to drugs and medicaments taken internally: Secondary | ICD-10-CM

## 2019-03-02 DIAGNOSIS — I2699 Other pulmonary embolism without acute cor pulmonale: Secondary | ICD-10-CM

## 2019-03-02 DIAGNOSIS — C7801 Secondary malignant neoplasm of right lung: Secondary | ICD-10-CM

## 2019-03-02 DIAGNOSIS — I700 Atherosclerosis of aorta: Secondary | ICD-10-CM

## 2019-03-02 DIAGNOSIS — Z7901 Long term (current) use of anticoagulants: Secondary | ICD-10-CM

## 2019-03-02 DIAGNOSIS — C787 Secondary malignant neoplasm of liver and intrahepatic bile duct: Secondary | ICD-10-CM

## 2019-03-02 DIAGNOSIS — C7802 Secondary malignant neoplasm of left lung: Secondary | ICD-10-CM | POA: Diagnosis not present

## 2019-03-02 DIAGNOSIS — Z95828 Presence of other vascular implants and grafts: Secondary | ICD-10-CM

## 2019-03-02 DIAGNOSIS — Z79899 Other long term (current) drug therapy: Secondary | ICD-10-CM

## 2019-03-02 DIAGNOSIS — D122 Benign neoplasm of ascending colon: Secondary | ICD-10-CM

## 2019-03-02 DIAGNOSIS — I10 Essential (primary) hypertension: Secondary | ICD-10-CM

## 2019-03-02 DIAGNOSIS — D61810 Antineoplastic chemotherapy induced pancytopenia: Secondary | ICD-10-CM

## 2019-03-02 DIAGNOSIS — R0609 Other forms of dyspnea: Secondary | ICD-10-CM

## 2019-03-02 DIAGNOSIS — Z5112 Encounter for antineoplastic immunotherapy: Secondary | ICD-10-CM | POA: Diagnosis not present

## 2019-03-02 DIAGNOSIS — I2510 Atherosclerotic heart disease of native coronary artery without angina pectoris: Secondary | ICD-10-CM

## 2019-03-02 LAB — CMP (CANCER CENTER ONLY)
ALT: 13 U/L (ref 0–44)
AST: 17 U/L (ref 15–41)
Albumin: 3 g/dL — ABNORMAL LOW (ref 3.5–5.0)
Alkaline Phosphatase: 114 U/L (ref 38–126)
Anion gap: 7 (ref 5–15)
BUN: 14 mg/dL (ref 8–23)
CO2: 21 mmol/L — ABNORMAL LOW (ref 22–32)
Calcium: 8.2 mg/dL — ABNORMAL LOW (ref 8.9–10.3)
Chloride: 111 mmol/L (ref 98–111)
Creatinine: 0.91 mg/dL (ref 0.61–1.24)
GFR, Est AFR Am: >60 mL/min (ref >60)
GFR, Estimated: >60 mL/min (ref >60)
Glucose, Bld: 101 mg/dL — ABNORMAL HIGH (ref 70–99)
Potassium: 4.2 mmol/L (ref 3.5–5.1)
Sodium: 139 mmol/L (ref 135–145)
Total Bilirubin: 0.4 mg/dL (ref 0.3–1.2)
Total Protein: 6.6 g/dL (ref 6.5–8.1)

## 2019-03-02 LAB — MAGNESIUM: Magnesium: 1.8 mg/dL (ref 1.7–2.4)

## 2019-03-02 LAB — CBC WITH DIFFERENTIAL (CANCER CENTER ONLY)
Abs Immature Granulocytes: 0.04 10*3/uL (ref 0.00–0.07)
Basophils Absolute: 0 10*3/uL (ref 0.0–0.1)
Basophils Relative: 1 %
Eosinophils Absolute: 0.2 10*3/uL (ref 0.0–0.5)
Eosinophils Relative: 4 %
HCT: 28.3 % — ABNORMAL LOW (ref 39.0–52.0)
Hemoglobin: 8.5 g/dL — ABNORMAL LOW (ref 13.0–17.0)
Immature Granulocytes: 1 %
Lymphocytes Relative: 17 %
Lymphs Abs: 0.8 10*3/uL (ref 0.7–4.0)
MCH: 32.6 pg (ref 26.0–34.0)
MCHC: 30 g/dL (ref 30.0–36.0)
MCV: 108.4 fL — ABNORMAL HIGH (ref 80.0–100.0)
Monocytes Absolute: 0.7 10*3/uL (ref 0.1–1.0)
Monocytes Relative: 15 %
Neutro Abs: 3 10*3/uL (ref 1.7–7.7)
Neutrophils Relative %: 62 %
Platelet Count: 144 10*3/uL — ABNORMAL LOW (ref 150–400)
RBC: 2.61 MIL/uL — ABNORMAL LOW (ref 4.22–5.81)
RDW: 20 % — ABNORMAL HIGH (ref 11.5–15.5)
WBC Count: 4.8 10*3/uL (ref 4.0–10.5)
nRBC: 0 % (ref 0.0–0.2)

## 2019-03-02 LAB — TOTAL PROTEIN, URINE DIPSTICK: Protein, ur: NEGATIVE mg/dL

## 2019-03-02 LAB — FERRITIN: Ferritin: 143 ng/mL (ref 24–336)

## 2019-03-02 LAB — CEA (IN HOUSE-CHCC): CEA (CHCC-In House): 12.01 ng/mL — ABNORMAL HIGH (ref 0.00–5.00)

## 2019-03-02 MED ORDER — SODIUM CHLORIDE 0.9 % IV SOLN
5.4000 mg/kg | Freq: Once | INTRAVENOUS | Status: AC
  Administered 2019-03-02: 600 mg via INTRAVENOUS
  Filled 2019-03-02: qty 20

## 2019-03-02 MED ORDER — SODIUM CHLORIDE 0.9 % IV SOLN
Freq: Once | INTRAVENOUS | Status: AC
  Administered 2019-03-02: 14:00:00 via INTRAVENOUS
  Filled 2019-03-02: qty 250

## 2019-03-02 MED ORDER — SODIUM CHLORIDE 0.9 % IV SOLN
510.0000 mg | Freq: Once | INTRAVENOUS | Status: AC
  Administered 2019-03-02: 510 mg via INTRAVENOUS
  Filled 2019-03-02: qty 510

## 2019-03-02 MED ORDER — HEPARIN SOD (PORK) LOCK FLUSH 100 UNIT/ML IV SOLN
500.0000 [IU] | Freq: Once | INTRAVENOUS | Status: AC | PRN
  Administered 2019-03-02: 500 [IU]
  Filled 2019-03-02: qty 5

## 2019-03-02 MED ORDER — SODIUM CHLORIDE 0.9% FLUSH
10.0000 mL | INTRAVENOUS | Status: DC | PRN
  Administered 2019-03-02: 10 mL
  Filled 2019-03-02: qty 10

## 2019-03-02 MED ORDER — SODIUM CHLORIDE 0.9% FLUSH
10.0000 mL | INTRAVENOUS | Status: DC | PRN
  Administered 2019-03-02: 13:00:00 10 mL
  Filled 2019-03-02: qty 10

## 2019-03-02 NOTE — Patient Instructions (Signed)
[facility] Discharge Instructions for Patients Receiving Chemotherapy  Today you received the following chemotherapy agents: panitumumab (Vectibix).   To help prevent nausea and vomiting after your treatment, we encourage you to take your nausea medication as prescribed by your physician.    If you develop nausea and vomiting that is not controlled by your nausea medication, call the clinic.   BELOW ARE SYMPTOMS THAT SHOULD BE REPORTED IMMEDIATELY:  *FEVER GREATER THAN 100.5 F  *CHILLS WITH OR WITHOUT FEVER  NAUSEA AND VOMITING THAT IS NOT CONTROLLED WITH YOUR NAUSEA MEDICATION  *UNUSUAL SHORTNESS OF BREATH  *UNUSUAL BRUISING OR BLEEDING  TENDERNESS IN MOUTH AND THROAT WITH OR WITHOUT PRESENCE OF ULCERS  *URINARY PROBLEMS  *BOWEL PROBLEMS  UNUSUAL RASH Items with * indicate a potential emergency and should be followed up as soon as possible.  Feel free to call the clinic should you have any questions or concerns. The clinic phone number is [phone].  Please show the [place] at check-in to the Emergency Department and triage nurse.  Ferumoxytol injection What is this medicine? FERUMOXYTOL is an iron complex. Iron is used to make healthy red blood cells, which carry oxygen and nutrients throughout the body. This medicine is used to treat iron deficiency anemia. This medicine may be used for other purposes; ask your health care provider or pharmacist if you have questions. COMMON BRAND NAME(S): Feraheme What should I tell my health care provider before I take this medicine? They need to know if you have any of these conditions: -anemia not caused by low iron levels -high levels of iron in the blood -magnetic resonance imaging (MRI) test scheduled -an unusual or allergic reaction to iron, other medicines, foods, dyes, or preservatives -pregnant or trying to get pregnant -breast-feeding How should I use this medicine? This medicine is for  injection into a vein. It is given by a health care professional in a hospital or clinic setting. Talk to your pediatrician regarding the use of this medicine in children. Special care may be needed. Overdosage: If you think you have taken too much of this medicine contact a poison control center or emergency room at once. NOTE: This medicine is only for you. Do not share this medicine with others. What if I miss a dose? It is important not to miss your dose. Call your doctor or health care professional if you are unable to keep an appointment. What may interact with this medicine? This medicine may interact with the following medications: -other iron products This list may not describe all possible interactions. Give your health care provider a list of all the medicines, herbs, non-prescription drugs, or dietary supplements you use. Also tell them if you smoke, drink alcohol, or use illegal drugs. Some items may interact with your medicine. What should I watch for while using this medicine? Visit your doctor or healthcare professional regularly. Tell your doctor or healthcare professional if your symptoms do not start to get better or if they get worse. You may need blood work done while you are taking this medicine. You may need to follow a special diet. Talk to your doctor. Foods that contain iron include: whole grains/cereals, dried fruits, beans, or peas, leafy green vegetables, and organ meats (liver, kidney). What side effects may I notice from receiving this medicine? Side effects that you should report to your doctor or health care professional as soon as possible: -allergic reactions like skin rash, itching or hives, swelling of the face,  lips, or tongue -breathing problems -changes in blood pressure -feeling faint or lightheaded, falls -fever or chills -flushing, sweating, or hot feelings -swelling of the ankles or feet Side effects that usually do not require medical attention  (report to your doctor or health care professional if they continue or are bothersome): -diarrhea -headache -nausea, vomiting -stomach pain This list may not describe all possible side effects. Call your doctor for medical advice about side effects. You may report side effects to FDA at 1-800-FDA-1088. Where should I keep my medicine? This drug is given in a hospital or clinic and will not be stored at home. NOTE: This sheet is a summary. It may not cover all possible information. If you have questions about this medicine, talk to your doctor, pharmacist, or health care provider.  2019 Elsevier/Gold Standard (2016-11-09 20:21:10)

## 2019-03-02 NOTE — Telephone Encounter (Signed)
Faxed information back to [employer], sent to HIM for scanning to chart.

## 2019-03-02 NOTE — Telephone Encounter (Signed)
No los per 5/28. °

## 2019-03-03 ENCOUNTER — Other Ambulatory Visit: Payer: Self-pay

## 2019-03-03 ENCOUNTER — Ambulatory Visit
Admission: RE | Admit: 2019-03-03 | Discharge: 2019-03-03 | Disposition: A | Discharge status: Home / Self Care | Payer: BLUE CROSS/BLUE SHIELD | Source: Ambulatory Visit | Attending: Radiation Oncology | Admitting: Radiation Oncology

## 2019-03-03 DIAGNOSIS — Z510 Encounter for antineoplastic radiation therapy: Secondary | ICD-10-CM | POA: Diagnosis not present

## 2019-03-04 ENCOUNTER — Encounter: Payer: Self-pay | Admitting: Hematology

## 2019-03-06 ENCOUNTER — Encounter: Payer: Self-pay | Admitting: Nurse Practitioner

## 2019-03-06 ENCOUNTER — Ambulatory Visit
Admission: RE | Admit: 2019-03-06 | Discharge: 2019-03-06 | Disposition: A | Discharge status: Home / Self Care | Payer: BC Managed Care – PPO | Source: Ambulatory Visit | Attending: Radiation Oncology | Admitting: Radiation Oncology

## 2019-03-06 ENCOUNTER — Other Ambulatory Visit: Payer: Self-pay

## 2019-03-06 DIAGNOSIS — C20 Malignant neoplasm of rectum: Secondary | ICD-10-CM | POA: Insufficient documentation

## 2019-03-06 DIAGNOSIS — C787 Secondary malignant neoplasm of liver and intrahepatic bile duct: Secondary | ICD-10-CM | POA: Insufficient documentation

## 2019-03-06 DIAGNOSIS — Z510 Encounter for antineoplastic radiation therapy: Secondary | ICD-10-CM | POA: Insufficient documentation

## 2019-03-07 ENCOUNTER — Ambulatory Visit
Admission: RE | Admit: 2019-03-07 | Discharge: 2019-03-07 | Disposition: A | Discharge status: Home / Self Care | Payer: BC Managed Care – PPO | Source: Ambulatory Visit | Attending: Radiation Oncology | Admitting: Radiation Oncology

## 2019-03-07 ENCOUNTER — Other Ambulatory Visit: Payer: Self-pay

## 2019-03-07 DIAGNOSIS — Z510 Encounter for antineoplastic radiation therapy: Secondary | ICD-10-CM | POA: Diagnosis not present

## 2019-03-08 ENCOUNTER — Ambulatory Visit
Admission: RE | Admit: 2019-03-08 | Discharge: 2019-03-08 | Disposition: A | Discharge status: Home / Self Care | Payer: BC Managed Care – PPO | Source: Ambulatory Visit | Attending: Radiation Oncology | Admitting: Radiation Oncology

## 2019-03-08 ENCOUNTER — Other Ambulatory Visit: Payer: Self-pay

## 2019-03-08 DIAGNOSIS — Z510 Encounter for antineoplastic radiation therapy: Secondary | ICD-10-CM | POA: Diagnosis not present

## 2019-03-08 NOTE — Progress Notes (Signed)
Billy Mendoza   [phone] Fax:([phone]) [phone]   Clinic Follow up Note   Patient Care Team: Merry Proud, MD as PCP - General (Internal Medicine) Truitt Merle, MD as Consulting Physician (Hematology) Carol Ada, MD as Consulting Physician (Gastroenterology) 03/09/2019  CHIEF COMPLAINT: f/u rectal cancer    SUMMARY OF ONCOLOGIC HISTORY: Oncology History   Cancer Staging Rectal cancer metastasized to liver [facility]) Staging form: Colon and Rectum, AJCC 8th Edition - Clinical stage from 07/28/2018: Stage IVB (cTX, cNX, pM1b) - Signed by Truitt Merle, MD on 08/18/2018       Rectal cancer metastasized to liver ([place])   07/28/2018 Initial Biopsy    Biopsy 07/28/18 Final Microscopic Diagnosis A.Colon, Ascending, Polyp:  -Fragments of tubular adenoma -No high-grade dysplasia of malignancy.  B. Rectum, Mass, Polyp:  -Invasive adenocarcinoma, moderately - poorly differentiatred.     07/28/2018 Cancer Staging    Staging form: Colon and Rectum, AJCC 8th Edition - Clinical stage from 07/28/2018: Stage IVB (cTX, cNX, pM1b) - Signed by Truitt Merle, MD on 08/18/2018    08/04/2018 Initial Diagnosis    Rectal cancer metastasized to liver ([place])    08/16/2018 Pathology Results    Biospy  Diagnosis 08/16/18 Liver, needle/core biopsy - ADENOCARCINOMA. Microscopic Comment By morphology, the findings are compatible with the provided clinical history of colorectal adenocarcinoma. Results reported to [name] on 08/18/2018. Intradepartmental consultation was obtained ([name]).     08/16/2018 Miscellaneous    Foundation 1 genomic testing: MS-stable Tumor mutation burden  3/Mb K-ras/NRAS wild-type BRAF V600 E KDMSA R266Q PTEN T326f* SMAD4 loss TP53 loss      08/18/2018 Procedure    Colonoscopy by Dr. HBenson Norwayshowed malignant appearing partially obstructing tumor in the rectum, 8 cm above anal verge.    08/18/2018 - 09/07/2018 Chemotherapy    CAPOX q3weeks  with Xeloda '2000mg'$  BID 2 weeks on, 1 weeks off starting 08/18/18.  Swithced treatment due to BRAF mutation.     09/07/2018 - 12/22/2018 Chemotherapy    FOLFOXIRI and avastin every 2 weeks starting 09/07/18. Oxaliplatin held starting with cycle 8 and continue with FOLFIRI and Avastin. D/c due to disease progression.     11/04/2018 Imaging    CT CAP 11/04/18 IMPRESSION: 1. Peripheral filling defect in the right main pulmonary artery extending primarily into the right lower lobe pulmonary artery. While the peripheral location indicates some degree of chronicity of this pulmonary embolus, this is a new finding compared to 07/28/2018. The right ventricular to left ventricular ratio is 0.7, which does not specifically indicate right heart strain. 2. The malignancy appears considerably improved, with reduced size pulmonary nodules; reduced size and increased internal necrosis of the hepatic metastatic lesions; marked improvement in the thoracic adenopathy; reduced conspicuity of the rectal tumor; and significant reduction in perirectal tumor burden/adenopathy. 3. Other imaging findings of potential clinical significance: Aortic Atherosclerosis (ICD10-I70.0). Coronary atherosclerosis. Bilateral renal cysts. Small umbilical hernia contains adipose tissue.  Critical Value/emergent results were called by telephone at the time of interpretation on 11/04/2018 at 3:05 pm to Dr. YTruitt Merle, who verbally acknowledged these results.    01/18/2019 Imaging    CT AP W contrast  IMPRESSION: 1. Colonic obstruction resulting from rectal carcinoma. 2. Hepatic metastatic disease which is decreased in size compared with the prior examination of 11/04/2018. 3. Multiple right middle lobe and right lower lobe pulmonary nodules most consistent with metastatic disease.    02/01/2019 -  Chemotherapy    second line BRAFTOVI(encorafenib)  374m daily and MEKTOVI(binimetinib), 461mq12h and Vectibix q2weeks starting  02/01/19.  -[facility]     02/13/2019 Imaging    CT Chest  IMPRESSION: Small bilateral pulmonary metastases, mildly progressive from January 2020.  Multifocal hepatic metastases, incompletely visualized, favored to be progressive from April 2020.  Aortic Atherosclerosis (ICD10-I70.0).     02/22/2019 -  Radiation Therapy    -Palliative local radiation  -Holding mektovi (binimetinib) during RT     CURRENT THERAPY: Second lineBRAFTOVI(encorafenib)30039mailyand MEKTOVI(binimetinib), 51m72m2hand Vectibix q2weeks starting4/29/20 -Mektovi on hold since 02/19/19 for upcoming RT to begin today 5/20 -palliative radiation beginning 02/22/19   INTERVAL HISTORY: Billy Mendoza for follow up as scheduled. S/p panitumumab and IV feraheme on 5/28. Facial acne-like rash much improved, on doxycycline. He feels great. Appetite and po intake are good. Fatigue is severe after radiation, better in the morning. Stool changes from formed to loose to watery, has some blood. Has fewer than 5 BMs per day but varies. He does not take lomotil, thinks its manageable. Rectal/lower pelvic pain is mild in the morning but increases after radiation. Yesterday pain was the worst it's been. Tylenol is not enough. Last week he was dizzy on standing, checked BP and systolic was in the 80's64'Q stopped amlodipine. Has not had any dizziness since then.    MEDICAL HISTORY:  Past Medical History:  [facility] Date  [facility]  [facility]ICAL [facility]ORY: Past Surgical History:  Procedure Laterality Date  . COLONIC STENT PLACEMENT N/A 01/19/2019   Procedure: COLONIC STENT PLACEMENT;  Surgeon: HungCarol Ada;  Location: [facility];  Service: Endoscopy;  Laterality: N/A;  . FLEXIBLE SIGMOIDOSCOPY N/A 01/19/2019   Procedure: FLEXIBLE SIGMOIDOSCOPY;  Surgeon: HungCarol Ada;  Location: [facility] ENDOSCOPY;  Service: Endoscopy;  Laterality: N/A;  . IR IMAGING GUIDED PORT INSERTION   08/16/2018  . IR US [place] BX ASP/DRAIN  08/16/2018    I have reviewed the social history and family history with the patient and they are unchanged from previous note.  ALLERGIES:  has No Known Allergies.  MEDICATIONS:  Current Outpatient Medications  Medication Sig Dispense Refill  . acetaminophen (TYLENOL) 325 MG tablet Take 650 mg by mouth every 6 (six) hours as needed for moderate pain.    . Binimetinib (MEKTOVI) 15 MG TABS Take 45 mg by mouth 2 (two) times a day. 180 tablet 3  . clindamycin (CLINDAGEL) 1 % gel Apply topically 2 (two) times daily. 60 g 0  . diphenoxylate-atropine (LOMOTIL) 2.5-0.025 MG tablet Take 1-2 tablets by mouth 4 (four) times daily as needed for diarrhea or loose stools. Up to 8 tabs daily for severe diarrhea 60 tablet 2  . doxycycline (VIBRA-TABS) 100 MG tablet Take 1 tablet (100 mg total) by mouth 2 (two) times daily. 60 tablet 0  . Encorafenib (BRAFTOVI) 75 MG CAPS Take 300 mg by mouth daily. 120 capsule 3  . potassium chloride SA (K-DUR) 20 MEQ tablet Take 1 tablet (20 mEq total) by mouth 2 (two) times daily. 60 tablet 3  . Rivaroxaban (XARELTO) 15 MG TABS tablet Take 1 tablet (15 mg total) by mouth daily with supper. 30 tablet 1  . VITAMIN E PO Take 1 tablet by mouth daily.    . traMADol (ULTRAM) 50 MG tablet Take 1 tablet (50 mg total) by mouth every 6 (six) hours as needed. 30 tablet 0   No current facility-administered medications for this visit.  PHYSICAL EXAMINATION: ECOG PERFORMANCE STATUS: 2 - Symptomatic, <50% confined to bed  Vitals:   03/09/19 0904  BP: 135/80  Pulse: 79  Resp: 18  Temp: 98 F (36.7 C)  SpO2: 100%   Filed Weights   03/09/19 0904  Weight: 238 lb 6.4 oz (108.1 kg)    GENERAL:alert, no distress and comfortable SKIN: mild acne-type rash to face, nose EYES:  sclera clear LUNGS: respirations even and unlabored  HEART:  no lower extremity edema Musculoskeletal:no cyanosis of digits  NEURO: alert & oriented x 3  with fluent speech, normal gait  Rectal: external exam shows perineal hyperpigmentation and erythema, no open wound  PAC without erythema    LABORATORY DATA:  I have reviewed the data as listed CBC Latest Ref Rng & Units 03/09/2019 03/02/2019 02/22/2019  WBC 4.0 - 10.5 K/uL 5.0 4.8 7.4  Hemoglobin 13.0 - 17.0 g/dL 9.6(L) 8.5(L) 7.8(L)  Hematocrit 39.0 - 52.0 % 32.0(L) 28.3(L) 26.2(L)  Platelets 150 - 400 K/uL 159 144(L) 224     CMP Latest Ref Rng & Units 03/09/2019 03/02/2019 02/22/2019  Glucose 70 - 99 mg/dL 93 101(H) 131(H)  BUN 8 - 23 mg/dL _0 Creatinine 0.61 - 1.24 mg/dL 0.82 0.91 1.04  Sodium 135 - 145 mmol/L 139 139 140  Potassium 3.5 - 5.1 mmol/L 4.5 4.2 3.6  Chloride 98 - 111 mmol/L 111 111 109  CO2 22 - 32 mmol/L 20(L) 21(L) 21(L)  Calcium 8.9 - 10.3 mg/dL 8.5(L) 8.2(L) 9.1  Total Protein 6.5 - 8.1 g/dL 6.7 6.6 6.7  Total Bilirubin 0.3 - 1.2 mg/dL 0.4 0.4 0.2(L)  Alkaline Phos 38 - 126 U/L 101 114 126  AST 15 - 41 U/L _1 ALT 0 - 44 U/L _2 RADIOGRAPHIC STUDIES: I have personally reviewed the radiological images as listed and agreed with the findings in the report. No results found.   ASSESSMENT & PLAN: Billy Mendoza a 68 y.o.malewith   1. Proximal rectal adenocarcinoma, metastatic to liver and likely tolungsandnodes, MSS,KRAS/NRAS wild type,BRAF V600E mutation (+) 2. Acne-type skin rash 3.Recent bowel obstruction from rectal tumor  4. Rectal bleeding 5. Iron deficiency anemia 6.History of right PE 7. Pancytopeniasecondarytochemotherapy 8. HTN 6. Goal of care discussion -treatment goal is palliative;full code now  Billy Mendoza appears well. He continues palliative RT and Braftovi. He is tolerating moderately well with fatigue and rectal pain. Pain is increasing, I prescribed tramadol and reviewed potential side effects. He has minimal rectal bleeding. Hgb 9.6; he takes xarelto 15 mg daily, holds periodically for increased  rectal bleeding. He is otherwise doing well.   VSS. I discontinued amlodipine, his BP has been stable lately and occasionally low at home.   Labs reviewed. He does not require IV fluid support today. He will complete RT on 6/10. He will return next week for f/u and panitumumab.   All questions were answered. The patient knows to call the clinic with any problems, questions or concerns. No barriers to learning was detected.     Billy Feeling, NP 03/09/19

## 2019-03-09 ENCOUNTER — Inpatient Hospital Stay: Payer: BC Managed Care – PPO | Attending: Adult Health | Admitting: Nurse Practitioner

## 2019-03-09 ENCOUNTER — Inpatient Hospital Stay: Payer: BC Managed Care – PPO

## 2019-03-09 ENCOUNTER — Ambulatory Visit
Admission: RE | Admit: 2019-03-09 | Discharge: 2019-03-09 | Disposition: A | Discharge status: Home / Self Care | Payer: BC Managed Care – PPO | Source: Ambulatory Visit | Attending: Radiation Oncology | Admitting: Radiation Oncology

## 2019-03-09 ENCOUNTER — Encounter: Payer: Self-pay | Admitting: Nurse Practitioner

## 2019-03-09 ENCOUNTER — Other Ambulatory Visit: Payer: Self-pay

## 2019-03-09 VITALS — BP 135/80 | HR 79 | Temp 98.0°F | Resp 18 | Ht 73.0 in | Wt 238.4 lb

## 2019-03-09 DIAGNOSIS — Z95828 Presence of other vascular implants and grafts: Secondary | ICD-10-CM

## 2019-03-09 DIAGNOSIS — C7802 Secondary malignant neoplasm of left lung: Secondary | ICD-10-CM | POA: Diagnosis not present

## 2019-03-09 DIAGNOSIS — C787 Secondary malignant neoplasm of liver and intrahepatic bile duct: Secondary | ICD-10-CM

## 2019-03-09 DIAGNOSIS — R159 Full incontinence of feces: Secondary | ICD-10-CM | POA: Diagnosis not present

## 2019-03-09 DIAGNOSIS — Z510 Encounter for antineoplastic radiation therapy: Secondary | ICD-10-CM | POA: Diagnosis not present

## 2019-03-09 DIAGNOSIS — Z86711 Personal history of pulmonary embolism: Secondary | ICD-10-CM | POA: Insufficient documentation

## 2019-03-09 DIAGNOSIS — I10 Essential (primary) hypertension: Secondary | ICD-10-CM

## 2019-03-09 DIAGNOSIS — C20 Malignant neoplasm of rectum: Secondary | ICD-10-CM | POA: Diagnosis not present

## 2019-03-09 DIAGNOSIS — Z7901 Long term (current) use of anticoagulants: Secondary | ICD-10-CM

## 2019-03-09 DIAGNOSIS — Z452 Encounter for adjustment and management of vascular access device: Secondary | ICD-10-CM | POA: Diagnosis not present

## 2019-03-09 DIAGNOSIS — Z5112 Encounter for antineoplastic immunotherapy: Secondary | ICD-10-CM | POA: Diagnosis not present

## 2019-03-09 DIAGNOSIS — L709 Acne, unspecified: Secondary | ICD-10-CM | POA: Diagnosis not present

## 2019-03-09 DIAGNOSIS — C7801 Secondary malignant neoplasm of right lung: Secondary | ICD-10-CM | POA: Insufficient documentation

## 2019-03-09 DIAGNOSIS — D61810 Antineoplastic chemotherapy induced pancytopenia: Secondary | ICD-10-CM | POA: Diagnosis not present

## 2019-03-09 DIAGNOSIS — R21 Rash and other nonspecific skin eruption: Secondary | ICD-10-CM | POA: Diagnosis not present

## 2019-03-09 DIAGNOSIS — D509 Iron deficiency anemia, unspecified: Secondary | ICD-10-CM | POA: Insufficient documentation

## 2019-03-09 LAB — CMP (CANCER CENTER ONLY)
ALT: 12 U/L (ref 0–44)
AST: 17 U/L (ref 15–41)
Albumin: 3 g/dL — ABNORMAL LOW (ref 3.5–5.0)
Alkaline Phosphatase: 101 U/L (ref 38–126)
Anion gap: 8 (ref 5–15)
BUN: 14 mg/dL (ref 8–23)
CO2: 20 mmol/L — ABNORMAL LOW (ref 22–32)
Calcium: 8.5 mg/dL — ABNORMAL LOW (ref 8.9–10.3)
Chloride: 111 mmol/L (ref 98–111)
Creatinine: 0.82 mg/dL (ref 0.61–1.24)
GFR, Est AFR Am: >60 mL/min
GFR, Estimated: >60 mL/min
Glucose, Bld: 93 mg/dL (ref 70–99)
Potassium: 4.5 mmol/L (ref 3.5–5.1)
Sodium: 139 mmol/L (ref 135–145)
Total Bilirubin: 0.4 mg/dL (ref 0.3–1.2)
Total Protein: 6.7 g/dL (ref 6.5–8.1)

## 2019-03-09 LAB — CBC WITH DIFFERENTIAL (CANCER CENTER ONLY)
Abs Immature Granulocytes: 0.07 10*3/uL (ref 0.00–0.07)
Basophils Absolute: 0.1 10*3/uL (ref 0.0–0.1)
Basophils Relative: 1 %
Eosinophils Absolute: 0.2 10*3/uL (ref 0.0–0.5)
Eosinophils Relative: 5 %
HCT: 32 % — ABNORMAL LOW (ref 39.0–52.0)
Hemoglobin: 9.6 g/dL — ABNORMAL LOW (ref 13.0–17.0)
Immature Granulocytes: 1 %
Lymphocytes Relative: 27 %
Lymphs Abs: 1.4 10*3/uL (ref 0.7–4.0)
MCH: 33.1 pg (ref 26.0–34.0)
MCHC: 30 g/dL (ref 30.0–36.0)
MCV: 110.3 fL — ABNORMAL HIGH (ref 80.0–100.0)
Monocytes Absolute: 0.7 10*3/uL (ref 0.1–1.0)
Monocytes Relative: 14 %
Neutro Abs: 2.6 10*3/uL (ref 1.7–7.7)
Neutrophils Relative %: 52 %
Platelet Count: 159 10*3/uL (ref 150–400)
RBC: 2.9 MIL/uL — ABNORMAL LOW (ref 4.22–5.81)
RDW: 21.1 % — ABNORMAL HIGH (ref 11.5–15.5)
WBC Count: 5 10*3/uL (ref 4.0–10.5)
nRBC: 0 % (ref 0.0–0.2)

## 2019-03-09 MED ORDER — SODIUM CHLORIDE 0.9% FLUSH
10.0000 mL | INTRAVENOUS | Status: DC | PRN
  Administered 2019-03-09: 10 mL
  Filled 2019-03-09: qty 10

## 2019-03-09 MED ORDER — HEPARIN SOD (PORK) LOCK FLUSH 100 UNIT/ML IV SOLN
500.0000 [IU] | Freq: Once | INTRAVENOUS | Status: AC | PRN
  Administered 2019-03-09: 500 [IU]
  Filled 2019-03-09: qty 5

## 2019-03-09 MED ORDER — TRAMADOL HCL 50 MG PO TABS: 50.0000 mg | ORAL_TABLET | Freq: Four times a day (QID) | ORAL | 0 refills | Status: DC | PRN

## 2019-03-10 ENCOUNTER — Ambulatory Visit
Admission: RE | Admit: 2019-03-10 | Discharge: 2019-03-10 | Disposition: A | Discharge status: Home / Self Care | Payer: BC Managed Care – PPO | Source: Ambulatory Visit | Attending: Radiation Oncology | Admitting: Radiation Oncology

## 2019-03-10 ENCOUNTER — Other Ambulatory Visit: Payer: Self-pay

## 2019-03-10 ENCOUNTER — Telehealth: Payer: Self-pay | Admitting: Nurse Practitioner

## 2019-03-10 DIAGNOSIS — Z510 Encounter for antineoplastic radiation therapy: Secondary | ICD-10-CM | POA: Diagnosis not present

## 2019-03-10 NOTE — Telephone Encounter (Signed)
Scheduled appt per 6/4 los. 

## 2019-03-12 ENCOUNTER — Encounter: Payer: Self-pay | Admitting: Nurse Practitioner

## 2019-03-12 ENCOUNTER — Other Ambulatory Visit: Payer: Self-pay | Admitting: Hematology

## 2019-03-12 ENCOUNTER — Other Ambulatory Visit: Payer: Self-pay | Admitting: Nurse Practitioner

## 2019-03-12 DIAGNOSIS — C20 Malignant neoplasm of rectum: Secondary | ICD-10-CM

## 2019-03-12 DIAGNOSIS — C787 Secondary malignant neoplasm of liver and intrahepatic bile duct: Secondary | ICD-10-CM

## 2019-03-13 ENCOUNTER — Other Ambulatory Visit: Payer: Self-pay

## 2019-03-13 ENCOUNTER — Ambulatory Visit
Admission: RE | Admit: 2019-03-13 | Discharge: 2019-03-13 | Disposition: A | Discharge status: Home / Self Care | Payer: BC Managed Care – PPO | Source: Ambulatory Visit | Attending: Radiation Oncology | Admitting: Radiation Oncology

## 2019-03-13 ENCOUNTER — Telehealth: Payer: Self-pay | Admitting: *Deleted

## 2019-03-13 ENCOUNTER — Encounter: Payer: Self-pay | Admitting: Hematology

## 2019-03-13 DIAGNOSIS — Z510 Encounter for antineoplastic radiation therapy: Secondary | ICD-10-CM | POA: Diagnosis not present

## 2019-03-13 NOTE — Progress Notes (Signed)
[place]   [phone] [phone]   Clinic Follow up Note   Patient Care Team: Merry Proud, MD as PCP - General (Internal Medicine) Truitt Merle, MD as Consulting Physician (Hematology) Carol Ada, MD as Consulting Physician (Gastroenterology)  Date of Service:  03/16/2019  CHIEF COMPLAINT: F/u of rectal cancer metastasized to liver  SUMMARY OF ONCOLOGIC HISTORY: Oncology History Overview Note  Cancer Staging Rectal cancer metastasized to liver [facility]) Staging form: Colon and Rectum, AJCC 8th Edition - Clinical stage from 07/28/2018: Stage IVB (cTX, cNX, pM1b) - Signed by Truitt Merle, MD on 08/18/2018     Rectal cancer metastasized to liver ([place])  07/28/2018 Initial Biopsy   Biopsy 07/28/18 Final Microscopic Diagnosis A.Colon, Ascending, Polyp:  -Fragments of tubular adenoma -No high-grade dysplasia of malignancy.  B. Rectum, Mass, Polyp:  -Invasive adenocarcinoma, moderately - poorly differentiatred.    07/28/2018 Cancer Staging   Staging form: Colon and Rectum, AJCC 8th Edition - Clinical stage from 07/28/2018: Stage IVB (cTX, cNX, pM1b) - Signed by Truitt Merle, MD on 08/18/2018   08/04/2018 Initial Diagnosis   Rectal cancer metastasized to liver ([place])   08/16/2018 Pathology Results   Biospy  Diagnosis 08/16/18 Liver, needle/core biopsy - ADENOCARCINOMA. Microscopic Comment By morphology, the findings are compatible with the provided clinical history of colorectal adenocarcinoma. Results reported to [name] on 08/18/2018. Intradepartmental consultation was obtained ([name]).    08/16/2018 Miscellaneous   Foundation 1 genomic testing: MS-stable Tumor mutation burden  3/Mb K-ras/NRAS wild-type BRAF V600 E KDMSA R266Q PTEN T364f* SMAD4 loss TP53 loss     08/18/2018 Procedure   Colonoscopy by Dr. HBenson Norwayshowed malignant appearing partially obstructing tumor in the rectum, 8 cm above anal verge.   08/18/2018  - 09/07/2018 Chemotherapy   CAPOX q3weeks with Xeloda 20078mBID 2 weeks on, 1 weeks off starting 08/18/18.  Swithced treatment due to BRAF mutation.    09/07/2018 - 12/22/2018 Chemotherapy   FOLFOXIRI and avastin every 2 weeks starting 09/07/18. Oxaliplatin held starting with cycle 8 and continue with FOLFIRI and Avastin. D/c due to disease progression.    11/04/2018 Imaging   CT CAP 11/04/18 IMPRESSION: 1. Peripheral filling defect in the right main pulmonary artery extending primarily into the right lower lobe pulmonary artery. While the peripheral location indicates some degree of chronicity of this pulmonary embolus, this is a new finding compared to 07/28/2018. The right ventricular to left ventricular ratio is 0.7, which does not specifically indicate right heart strain. 2. The malignancy appears considerably improved, with reduced size pulmonary nodules; reduced size and increased internal necrosis of the hepatic metastatic lesions; marked improvement in the thoracic adenopathy; reduced conspicuity of the rectal tumor; and significant reduction in perirectal tumor burden/adenopathy. 3. Other imaging findings of potential clinical significance: Aortic Atherosclerosis (ICD10-I70.0). Coronary atherosclerosis. Bilateral renal cysts. Small umbilical hernia contains adipose tissue.  Critical Value/emergent results were called by telephone at the time of interpretation on 11/04/2018 at 3:05 pm to Dr. YATruitt Merle who verbally acknowledged these results.   01/18/2019 Imaging   CT AP W contrast  IMPRESSION: 1. Colonic obstruction resulting from rectal carcinoma. 2. Hepatic metastatic disease which is decreased in size compared with the prior examination of 11/04/2018. 3. Multiple right middle lobe and right lower lobe pulmonary nodules most consistent with metastatic disease.   02/01/2019 -  Chemotherapy   second line BRAFTOVI(encorafenib) 30064maily and MEKTOVI(binimetinib), 69m46m2h  and Vectibix q2weeks starting 02/01/19.  -He has held  Mektovi starting 02/19/19 due to RT    02/13/2019 Imaging   CT Chest  IMPRESSION: Small bilateral pulmonary metastases, mildly progressive from January 2020.  Multifocal hepatic metastases, incompletely visualized, favored to be progressive from April 2020.  Aortic Atherosclerosis (ICD10-I70.0).    02/22/2019 -  Radiation Therapy   -Palliative local radiation  -Holding mektovi (binimetinib) during RT      CURRENT THERAPY:  -Second lineBRAFTOVI(encorafenib)33m dailyand MEKTOVI(binimetinib), 452mq12hand Vectibix q2weeks starting4/29/20. Held MEKTOVI starting 02/19/19 due to RT.  -Radiation 02/21/2005/16/20. Stopped RT after 03/13/19 due to pain.   INTERVAL HISTORY:  KeMarcelle Bebouts here for a follow up of treatment. He presents to the clinic alone. He notes he has had significant rectal pain from RT. This started earlier this week and needed increase in tramadol. Pain much worsened yesterday after he ate which last for 5 hours. NP AlEbony Hailalled in Oxycodone. He plans to see Dr HuBenson Norwayo check his stent location. He the pain is cramping and squeezing from tumor pressing against stent. He notes mucus from rectum. He notes his last BM was mildly hard. He feels it its better this way because watery stool leads to incontinence and he has had accidents. He has been getting 3 hours a night of sleeping this week. Before 40m32mxycodone his pain is 10/10 and after drops to 04/2019.  He notes his BP at home was 106/67, he denies dizziness. He tries to drink adequately.    REVIEW OF SYSTEMS:   Constitutional: Denies fevers, chills or abnormal weight loss (+) trouble sleeping  Eyes: Denies blurriness of vision Ears, nose, mouth, throat, and face: Denies mucositis or sore throat Respiratory: Denies cough, dyspnea or wheezes Cardiovascular: Denies palpitation, chest discomfort or lower extremity swelling Gastrointestinal:  Denies nausea,  heartburn (+) Stool incontinence (+) Rectal cramping pain  Skin: Denies abnormal skin rashes Lymphatics: Denies new lymphadenopathy or easy bruising Neurological:Denies numbness, tingling or new weaknesses Behavioral/Psych: Mood is stable, no new changes  All other systems were reviewed with the patient and are negative.  MEDICAL HISTORY:  Past Medical History:  Diagnosis Date  [facility]   SURGICAL HISTORY: Past Surgical History:  Procedure Laterality Date  . COLONIC STENT PLACEMENT N/A 01/19/2019   Procedure: COLONIC STENT PLACEMENT;  Surgeon: HunCarol AdaD;  Location: [facility];  Service: Endoscopy;  Laterality: N/A;  . FLEXIBLE SIGMOIDOSCOPY N/A 01/19/2019   Procedure: FLEXIBLE SIGMOIDOSCOPY;  Surgeon: HunCarol AdaD;  Location: [facility] END[facility];  Service: Endoscopy;  Laterality: N/A;  . IR IMAGING GUIDED PORT INSERTION  08/16/2018  . IR US [place] BX ASP/DRAIN  08/16/2018    I have reviewed the social history and family history with the patient and they are unchanged from previous note.  ALLERGIES:  has No Known Allergies.  MEDICATIONS:  Current Outpatient Medications  Medication Sig Dispense Refill  . acetaminophen (TYLENOL) 325 MG tablet Take 650 mg by mouth every 6 (six) hours as needed for moderate pain.    . clindamycin (CLINDAGEL) 1 % gel Apply topically 2 (two) times daily. 60 g 0  . diphenoxylate-atropine (LOMOTIL) 2.5-0.025 MG tablet Take 1-2 tablets by mouth 4 (four) times daily as needed for diarrhea or loose stools. Up to 8 tabs daily for severe diarrhea 60 tablet 2  . doxycycline (VIBRA-TABS) 100 MG tablet TAKE 1 TABLET(100 MG) BY MOUTH TWICE DAILY 60 tablet 0  . Encorafenib (BRAFTOVI) 75 MG CAPS Take 300 mg by mouth daily. 120 capsule 3  . oxyCODONE (  OXY IR/ROXICODONE) 5 MG immediate release tablet Take 1 tablet (5 mg total) by mouth every 4 (four) hours as needed for severe pain. 60 tablet 0  . potassium chloride SA (K-DUR) 20 MEQ tablet Take 1 tablet  (20 mEq total) by mouth 2 (two) times daily. 60 tablet 3  . Rivaroxaban (XARELTO) 15 MG TABS tablet Take 1 tablet (15 mg total) by mouth daily with supper. 30 tablet 1  . traMADol (ULTRAM) 50 MG tablet Take 1 tablet (50 mg total) by mouth every 6 (six) hours as needed. 30 tablet 0  . VITAMIN E PO Take 1 tablet by mouth daily.    . Binimetinib (MEKTOVI) 15 MG TABS Take 45 mg by mouth 2 (two) times a day. (Patient not taking: Reported on 03/16/2019) 180 tablet 3  . morphine (MS CONTIN) 15 MG 12 hr tablet Take 1 tablet (15 mg total) by mouth every 12 (twelve) hours. 30 tablet 0   Current Facility-Administered Medications  Medication Dose Route Frequency Provider Last Rate Last Dose  . heparin lock flush 100 unit/mL  250 Units Intracatheter Once PRN Truitt Merle, MD        PHYSICAL EXAMINATION: ECOG PERFORMANCE STATUS: 3 - Symptomatic, >50% confined to bed  Vitals:   03/16/19 1143  BP: (!) 149/87  Pulse: 95  Resp: 18  Temp: 98.5 F (36.9 C)  SpO2: 100%   Filed Weights   03/16/19 1143  Weight: 232 lb 11.2 oz (105.6 kg)    GENERAL:alert, no distress and comfortable SKIN: skin color, texture, turgor are normal, no rashes or significant lesions EYES: normal, Conjunctiva are pink and non-injected, sclera clear  NECK: supple, thyroid normal size, non-tender, without nodularity LYMPH:  no palpable lymphadenopathy in the cervical, axillary  LUNGS: clear to auscultation and percussion with normal breathing effort HEART: regular rate & rhythm and no murmurs and no lower extremity edema ABDOMEN:abdomen soft, non-tender and normal bowel sounds Musculoskeletal:no cyanosis of digits and no clubbing  NEURO: alert & oriented x 3 with fluent speech, no focal motor/sensory deficits  LABORATORY DATA:  I have reviewed the data as listed CBC Latest Ref Rng & Units 03/16/2019 03/09/2019 03/02/2019  WBC 4.0 - 10.5 K/uL 5.0 5.0 4.8  Hemoglobin 13.0 - 17.0 g/dL 10.4(L) 9.6(L) 8.5(L)  Hematocrit 39.0 - 52.0 %  33.5(L) 32.0(L) 28.3(L)  Platelets 150 - 400 K/uL 161 159 144(L)     CMP Latest Ref Rng & Units 03/16/2019 03/09/2019 03/02/2019  Glucose 70 - 99 mg/dL 113(H) 93 101(H)  BUN 8 - 23 mg/dL _0 Creatinine 0.61 - 1.24 mg/dL 0.97 0.82 0.91  Sodium 135 - 145 mmol/L 138 139 139  Potassium 3.5 - 5.1 mmol/L 4.4 4.5 4.2  Chloride 98 - 111 mmol/L 109 111 111  CO2 22 - 32 mmol/L 20(L) 20(L) 21(L)  Calcium 8.9 - 10.3 mg/dL 8.7(L) 8.5(L) 8.2(L)  Total Protein 6.5 - 8.1 g/dL 6.9 6.7 6.6  Total Bilirubin 0.3 - 1.2 mg/dL 0.4 0.4 0.4  Alkaline Phos 38 - 126 U/L 104 101 114  AST 15 - 41 U/L _1 ALT 0 - 44 U/L _2 RADIOGRAPHIC STUDIES: I have personally reviewed the radiological images as listed and agreed with the findings in the report. No results found.   ASSESSMENT & PLAN:  Billy Mendoza is a 68 y.o. male with   1. Proximal rectal adenocarcinoma, metastatic to liver and likely tolungsandnodes, MSS,KRAS/NRAS wild type,BRAF V600E mutation (+) -  He was diagnosed in10/2019.He completed 1 cycle of CAPOX before changing todose reducedFOLFOXIRI andAvastindue to BRAF mutation in his tumor. -Hismetastatic diseaseis not curablewith surgery, he will continue treatment for as long as he can tolerate to control disease. -Hehas beentoleratingFOLFOXIRI moderately well. Due to significant cytopenias, chemo was held on 01/04/2019 -He was hospitalized on 01/18/19 forbowel obstruction due to rectal cancer. He had stent placed by Dr. Benson Norway. His CT AP showed his known rectaltumor andobstruction, liver mets have decreased in size and multiple new right lung nodules consistent with metastasis. -I have changed his chemo to second-lineoralBRAF/MEK/EGFR inhibitors(Braftovi, Mektovi andVectibix q2weeks)which he started on 02/01/19.Tolerating well.  -He has held [facility] since 02/19/19 and started Radiation on 02/22/19.  -Due to significant rectal pain, diarrhea and stool incontinence,  he stopped RT after 03/13/19. He had 2 treatments remaining and is reluctant to finishing. I discussed it would need to be completed soon to still have benefit. He agreed to have last two RT treatment next Monday and Tuesday  -We reviewed pain management and stool management. He will f/u with Dr Benson Norway to evaluate his stent today.  -Labs revewied, CBC and CMP WNL except Hg 10.4, BG 113, CA 8.7, albumin 3, urine protein normal, Mag 1.6.  -Hold Braftovi and Vectibix this week.  -F/u next week   2.Recent bowel obstruction from rectal tumor, severe rectal pain   -s/p stent placement by Dr. Benson Norway on 01/19/2019,he declined loop colostomybut agree to keep surgery as last resort.  -He understands thehigh possibility of recurrent bowel obstruction due to tumor progression.  -He has met with dietician. He will continue with low residual diet. -He has started short course Palliative radiation with Dr Lisbeth Renshaw 02/22/19-03/15/19. He was not able to complete last 2 treatments due to rectal pain and diarrhea with stool incontinence. He will consider proceeding with last 2 RT next week.  -He will f/u with Dr Benson Norway to evaluate his stent today  -For constipation I instructed him to use Miralax at low dose daily to prevent SBO and diarrhea. He still has imodium and laxatives for any severe changes.  -Tramadol was not controlling rectal pain. He is currently on 82m Oxycodone q6hours but only mildly helping pain. I will also call in long acting MS Contin 170mBID temporarily. He can use Oxycodone for break through pain (03/16/19).    3. Rectal bleeding -likely related to tumor bleeding and anticoagulation (Xarelto 20 mg daily)- bleeding improved with lower dose, he is on 1538maily now  -Has occurred intermittently since diagnosis, improves for short period after treatments -He will hold Xarelto for moderate bleeding with clots. For mild bleeding he can continue Xarelto.   4. Iron deficiency anemia -secondary to blood  loss from tumor  -ser iron 29, TIBC 369, 8% saturation ratio, consistent with iron deficiency  -He can start prenatal vitamin with iron once daily -He received initial 2 doses of IV Feraheme on 02/22/19 and 03/02/19.  -Hg at 10.4 today (03/16/19).   5.History of right PE -His PE is likely related to his underlying metastatic colon cancer and chemotherapy, especially Avastin. -He is on Xarelto 20 mg daily and will continue indefinitely. Has occasionally epistaxis secondary to Avastin,thrombocytopenia and Xarelto.  6. Pancytopeniasecondarytochemotherapy -Will monitor, overall improved -WBC and PLT normal, Hg 10.4 today (03/16/19)  7. HTN  -TakesAmlodipine -BPoverall controlled, BP elevated today at 149/87 (03/16/19)  8. Acne-type skin rash -secondary to Panitumumab, developed after cycle 1 -will continue clindamycin gel BID and hydrocortisone and add doxycycline 100  mg BID -rash is drying out some, intermittent with infusion. Very mild currently   9. Goal of care discussion -He is full code now -He understands his disease is incurable,and the goal of therapy is palliative to prolong his life.   Plan -I called in #30 tablets of MS Contin 30m q12h. I will also refill his oxycodone  -Labs reviewed, will hold Vectibix today and hold Braftovi for now due to his severe rectal pain and diarrhea  -Reschedule vectibix treatment to next week, will see him before treatment     No problem-specific Assessment & Plan notes found for this encounter.   No orders of the defined types were placed in this encounter.  All questions were answered. The patient knows to call the clinic with any problems, questions or concerns. No barriers to learning was detected. I spent 25 minutes counseling the patient face to face. The total time spent in the appointment was 30 minutes and more than 50% was on counseling and review of test results     YTruitt Merle MD 03/16/2019   I, AJoslyn Devon am acting as scribe for YTruitt Merle MD.   I have reviewed the above documentation for accuracy and completeness, and I agree with the above.

## 2019-03-13 NOTE — Telephone Encounter (Signed)
Received vm message from pt's wife requesting a call back regarding pain control for her husband. TCT pt's wife, Vania Rea. She states that the current pain medicine of tramadol 50 mg every 6 hours is not helping at all. Current pain level is 7-8/10.  The pain is quite disabling at this time. Pain the same pain he was having at his last visit on 03/09/19  They use [facility] on [address] and [address].

## 2019-03-13 NOTE — Telephone Encounter (Signed)
I called him back, he can increase to 100 mg q4 until he sees Rad onc tomorrow. He will discuss pain control with them tomorrow. Thanks, Regan Rakers NP

## 2019-03-14 ENCOUNTER — Telehealth: Payer: Self-pay | Admitting: *Deleted

## 2019-03-14 ENCOUNTER — Telehealth: Payer: Self-pay

## 2019-03-14 ENCOUNTER — Other Ambulatory Visit: Payer: Self-pay | Admitting: Nurse Practitioner

## 2019-03-14 ENCOUNTER — Ambulatory Visit: Admission: RE | Admit: 2019-03-14 | Payer: BC Managed Care – PPO | Source: Ambulatory Visit

## 2019-03-14 ENCOUNTER — Telehealth: Payer: Self-pay | Admitting: Radiation Oncology

## 2019-03-14 MED ORDER — OXYCODONE HCL 5 MG PO TABS: 5.0000 mg | ORAL_TABLET | ORAL | 0 refills | Status: DC | PRN

## 2019-03-14 NOTE — Telephone Encounter (Signed)
I spoke with the patient and he has shooting pain in the rectum that comes in waves every 5-7 minutes. He denies fevers or chills. He denies any abdominal fullness or distention. No bleeding is noted with BMs and he feels constant urgency. He has noticed increasing rectal mucous as well. Because of this he requests increase in his current regimen of tramadol and to discontinue his remaining 2 treatment. I let him know we could try oxycodone instead and he is in agreement to try this. We could also try anusol suppositories. He is not interested in the suppositories but will let us know if he changes his mind or changes his mind about finishing his remaining treatments.

## 2019-03-14 NOTE — Telephone Encounter (Signed)
Spoke with the patient to check in on his pain.  He voiced he is having significant pain and would not be in for treatment today.  I let him know that PA Dara Lords would like to speak with him.  He asked she could give him a call.  I let the patient know that she would call him.  No further requests.  Will continue to follow as necessary.  Gloriajean Dell. Leonie Green, BSN

## 2019-03-14 NOTE — Telephone Encounter (Signed)
Received TC from patient stating that he has cancelled the last 2 radiation appointments. Patient stated that they are just too painful and he cant take it anymore. He asked that I give a message to [facility] to call him back and discuss stronger pain medication options with him. Lacie made aware of patients request.

## 2019-03-15 ENCOUNTER — Ambulatory Visit: Payer: BC Managed Care – PPO

## 2019-03-16 ENCOUNTER — Ambulatory Visit: Payer: BC Managed Care – PPO

## 2019-03-16 ENCOUNTER — Inpatient Hospital Stay: Payer: BC Managed Care – PPO

## 2019-03-16 ENCOUNTER — Inpatient Hospital Stay (HOSPITAL_BASED_OUTPATIENT_CLINIC_OR_DEPARTMENT_OTHER): Payer: BC Managed Care – PPO | Admitting: Hematology

## 2019-03-16 ENCOUNTER — Telehealth: Payer: Self-pay | Admitting: *Deleted

## 2019-03-16 ENCOUNTER — Encounter: Payer: Self-pay | Admitting: Hematology

## 2019-03-16 ENCOUNTER — Other Ambulatory Visit: Payer: Self-pay

## 2019-03-16 VITALS — BP 149/87 | HR 95 | Temp 98.5°F | Resp 18 | Ht 73.0 in | Wt 232.7 lb

## 2019-03-16 DIAGNOSIS — C20 Malignant neoplasm of rectum: Secondary | ICD-10-CM | POA: Diagnosis not present

## 2019-03-16 DIAGNOSIS — Z7901 Long term (current) use of anticoagulants: Secondary | ICD-10-CM

## 2019-03-16 DIAGNOSIS — C787 Secondary malignant neoplasm of liver and intrahepatic bile duct: Secondary | ICD-10-CM | POA: Diagnosis not present

## 2019-03-16 DIAGNOSIS — C7802 Secondary malignant neoplasm of left lung: Secondary | ICD-10-CM | POA: Diagnosis not present

## 2019-03-16 DIAGNOSIS — Z86711 Personal history of pulmonary embolism: Secondary | ICD-10-CM

## 2019-03-16 DIAGNOSIS — C7801 Secondary malignant neoplasm of right lung: Secondary | ICD-10-CM

## 2019-03-16 DIAGNOSIS — Z5112 Encounter for antineoplastic immunotherapy: Secondary | ICD-10-CM | POA: Diagnosis not present

## 2019-03-16 DIAGNOSIS — R159 Full incontinence of feces: Secondary | ICD-10-CM

## 2019-03-16 DIAGNOSIS — Z95828 Presence of other vascular implants and grafts: Secondary | ICD-10-CM

## 2019-03-16 DIAGNOSIS — L709 Acne, unspecified: Secondary | ICD-10-CM

## 2019-03-16 DIAGNOSIS — Z452 Encounter for adjustment and management of vascular access device: Secondary | ICD-10-CM | POA: Diagnosis not present

## 2019-03-16 DIAGNOSIS — D61810 Antineoplastic chemotherapy induced pancytopenia: Secondary | ICD-10-CM

## 2019-03-16 DIAGNOSIS — R21 Rash and other nonspecific skin eruption: Secondary | ICD-10-CM

## 2019-03-16 DIAGNOSIS — D509 Iron deficiency anemia, unspecified: Secondary | ICD-10-CM

## 2019-03-16 DIAGNOSIS — I10 Essential (primary) hypertension: Secondary | ICD-10-CM

## 2019-03-16 LAB — CMP (CANCER CENTER ONLY)
ALT: 14 U/L (ref 0–44)
AST: 16 U/L (ref 15–41)
Albumin: 3 g/dL — ABNORMAL LOW (ref 3.5–5.0)
Alkaline Phosphatase: 104 U/L (ref 38–126)
Anion gap: 9 (ref 5–15)
BUN: 14 mg/dL (ref 8–23)
CO2: 20 mmol/L — ABNORMAL LOW (ref 22–32)
Calcium: 8.7 mg/dL — ABNORMAL LOW (ref 8.9–10.3)
Chloride: 109 mmol/L (ref 98–111)
Creatinine: 0.97 mg/dL (ref 0.61–1.24)
GFR, Est AFR Am: >60 mL/min (ref >60)
GFR, Estimated: >60 mL/min (ref >60)
Glucose, Bld: 113 mg/dL — ABNORMAL HIGH (ref 70–99)
Potassium: 4.4 mmol/L (ref 3.5–5.1)
Sodium: 138 mmol/L (ref 135–145)
Total Bilirubin: 0.4 mg/dL (ref 0.3–1.2)
Total Protein: 6.9 g/dL (ref 6.5–8.1)

## 2019-03-16 LAB — TOTAL PROTEIN, URINE DIPSTICK: Protein, ur: NEGATIVE mg/dL

## 2019-03-16 LAB — CBC WITH DIFFERENTIAL (CANCER CENTER ONLY)
Abs Immature Granulocytes: 0.05 10*3/uL (ref 0.00–0.07)
Basophils Absolute: 0.1 10*3/uL (ref 0.0–0.1)
Basophils Relative: 1 %
Eosinophils Absolute: 0.3 10*3/uL (ref 0.0–0.5)
Eosinophils Relative: 5 %
HCT: 33.5 % — ABNORMAL LOW (ref 39.0–52.0)
Hemoglobin: 10.4 g/dL — ABNORMAL LOW (ref 13.0–17.0)
Immature Granulocytes: 1 %
Lymphocytes Relative: 16 %
Lymphs Abs: 0.8 10*3/uL (ref 0.7–4.0)
MCH: 32.8 pg (ref 26.0–34.0)
MCHC: 31 g/dL (ref 30.0–36.0)
MCV: 105.7 fL — ABNORMAL HIGH (ref 80.0–100.0)
Monocytes Absolute: 0.8 10*3/uL (ref 0.1–1.0)
Monocytes Relative: 17 %
Neutro Abs: 3 10*3/uL (ref 1.7–7.7)
Neutrophils Relative %: 60 %
Platelet Count: 161 10*3/uL (ref 150–400)
RBC: 3.17 MIL/uL — ABNORMAL LOW (ref 4.22–5.81)
RDW: 19.4 % — ABNORMAL HIGH (ref 11.5–15.5)
WBC Count: 5 10*3/uL (ref 4.0–10.5)
nRBC: 0 % (ref 0.0–0.2)

## 2019-03-16 LAB — MAGNESIUM: Magnesium: 1.6 mg/dL — ABNORMAL LOW (ref 1.7–2.4)

## 2019-03-16 MED ORDER — SODIUM CHLORIDE 0.9% FLUSH
10.0000 mL | Freq: Once | INTRAVENOUS | Status: AC | PRN
  Administered 2019-03-16: 10 mL
  Filled 2019-03-16: qty 10

## 2019-03-16 MED ORDER — HEPARIN SOD (PORK) LOCK FLUSH 100 UNIT/ML IV SOLN
250.0000 [IU] | Freq: Once | INTRAVENOUS | Status: DC | PRN
  Filled 2019-03-16: qty 5

## 2019-03-16 MED ORDER — MORPHINE SULFATE ER 15 MG PO TBCR: 15.0000 mg | EXTENDED_RELEASE_TABLET | Freq: Two times a day (BID) | ORAL | 0 refills | Status: DC

## 2019-03-16 MED ORDER — SODIUM CHLORIDE 0.9% FLUSH
10.0000 mL | INTRAVENOUS | Status: DC | PRN
  Administered 2019-03-16: 10 mL
  Filled 2019-03-16: qty 10

## 2019-03-16 MED ORDER — HEPARIN SOD (PORK) LOCK FLUSH 100 UNIT/ML IV SOLN
500.0000 [IU] | Freq: Once | INTRAVENOUS | Status: AC
  Administered 2019-03-16: 500 [IU]
  Filled 2019-03-16: qty 5

## 2019-03-16 MED ORDER — OXYCODONE HCL 5 MG PO TABS: 5.0000 mg | ORAL_TABLET | ORAL | 0 refills | Status: DC | PRN

## 2019-03-16 NOTE — Patient Instructions (Signed)

## 2019-03-16 NOTE — Progress Notes (Signed)
PT had band aid on from last week I instructed him to remove band aid after 2 hrs.

## 2019-03-16 NOTE — Telephone Encounter (Signed)
Called the patient, was unable to leave a voicemail to give him results from his images.  Will re-attempt as time permits.  Gloriajean Dell. Leonie Green, BSN

## 2019-03-17 ENCOUNTER — Ambulatory Visit: Payer: BC Managed Care – PPO

## 2019-03-17 ENCOUNTER — Telehealth: Payer: Self-pay | Admitting: Hematology

## 2019-03-17 NOTE — Telephone Encounter (Signed)
Scheduled appt per 6/11 los. Patient line was busy and I was not able to leave a voice message. I called twice with no luck.

## 2019-03-20 ENCOUNTER — Ambulatory Visit
Admission: RE | Admit: 2019-03-20 | Discharge: 2019-03-20 | Disposition: A | Discharge status: Home / Self Care | Payer: BC Managed Care – PPO | Source: Ambulatory Visit | Attending: Radiation Oncology | Admitting: Radiation Oncology

## 2019-03-20 ENCOUNTER — Ambulatory Visit: Payer: BC Managed Care – PPO

## 2019-03-20 ENCOUNTER — Other Ambulatory Visit: Payer: Self-pay

## 2019-03-20 DIAGNOSIS — Z510 Encounter for antineoplastic radiation therapy: Secondary | ICD-10-CM | POA: Diagnosis not present

## 2019-03-21 ENCOUNTER — Ambulatory Visit
Admission: RE | Admit: 2019-03-21 | Discharge: 2019-03-21 | Disposition: A | Discharge status: Home / Self Care | Payer: BC Managed Care – PPO | Source: Ambulatory Visit | Attending: Radiation Oncology | Admitting: Radiation Oncology

## 2019-03-21 ENCOUNTER — Encounter: Payer: Self-pay | Admitting: Radiation Oncology

## 2019-03-21 ENCOUNTER — Other Ambulatory Visit: Payer: Self-pay

## 2019-03-21 DIAGNOSIS — Z510 Encounter for antineoplastic radiation therapy: Secondary | ICD-10-CM | POA: Diagnosis not present

## 2019-03-23 ENCOUNTER — Inpatient Hospital Stay (HOSPITAL_BASED_OUTPATIENT_CLINIC_OR_DEPARTMENT_OTHER): Payer: BC Managed Care – PPO | Admitting: Nurse Practitioner

## 2019-03-23 ENCOUNTER — Encounter: Payer: Self-pay | Admitting: Nurse Practitioner

## 2019-03-23 ENCOUNTER — Inpatient Hospital Stay: Payer: BC Managed Care – PPO

## 2019-03-23 ENCOUNTER — Other Ambulatory Visit: Payer: Self-pay

## 2019-03-23 VITALS — BP 118/88 | HR 86 | Temp 98.7°F | Resp 18 | Ht 73.0 in | Wt 233.2 lb

## 2019-03-23 DIAGNOSIS — R159 Full incontinence of feces: Secondary | ICD-10-CM

## 2019-03-23 DIAGNOSIS — Z95828 Presence of other vascular implants and grafts: Secondary | ICD-10-CM

## 2019-03-23 DIAGNOSIS — C20 Malignant neoplasm of rectum: Secondary | ICD-10-CM | POA: Diagnosis not present

## 2019-03-23 DIAGNOSIS — D61810 Antineoplastic chemotherapy induced pancytopenia: Secondary | ICD-10-CM

## 2019-03-23 DIAGNOSIS — C787 Secondary malignant neoplasm of liver and intrahepatic bile duct: Secondary | ICD-10-CM

## 2019-03-23 DIAGNOSIS — Z5112 Encounter for antineoplastic immunotherapy: Secondary | ICD-10-CM | POA: Diagnosis not present

## 2019-03-23 DIAGNOSIS — Z86711 Personal history of pulmonary embolism: Secondary | ICD-10-CM

## 2019-03-23 DIAGNOSIS — I10 Essential (primary) hypertension: Secondary | ICD-10-CM

## 2019-03-23 DIAGNOSIS — C7801 Secondary malignant neoplasm of right lung: Secondary | ICD-10-CM

## 2019-03-23 DIAGNOSIS — C7802 Secondary malignant neoplasm of left lung: Secondary | ICD-10-CM

## 2019-03-23 DIAGNOSIS — D509 Iron deficiency anemia, unspecified: Secondary | ICD-10-CM

## 2019-03-23 DIAGNOSIS — Z7901 Long term (current) use of anticoagulants: Secondary | ICD-10-CM

## 2019-03-23 DIAGNOSIS — Z452 Encounter for adjustment and management of vascular access device: Secondary | ICD-10-CM | POA: Diagnosis not present

## 2019-03-23 DIAGNOSIS — R21 Rash and other nonspecific skin eruption: Secondary | ICD-10-CM

## 2019-03-23 DIAGNOSIS — L709 Acne, unspecified: Secondary | ICD-10-CM

## 2019-03-23 LAB — CBC WITH DIFFERENTIAL (CANCER CENTER ONLY)
Abs Immature Granulocytes: 0.04 10*3/uL (ref 0.00–0.07)
Basophils Absolute: 0 10*3/uL (ref 0.0–0.1)
Basophils Relative: 1 %
Eosinophils Absolute: 0.3 10*3/uL (ref 0.0–0.5)
Eosinophils Relative: 6 %
HCT: 33.6 % — ABNORMAL LOW (ref 39.0–52.0)
Hemoglobin: 10.2 g/dL — ABNORMAL LOW (ref 13.0–17.0)
Immature Granulocytes: 1 %
Lymphocytes Relative: 20 %
Lymphs Abs: 1.2 10*3/uL (ref 0.7–4.0)
MCH: 32.7 pg (ref 26.0–34.0)
MCHC: 30.4 g/dL (ref 30.0–36.0)
MCV: 107.7 fL — ABNORMAL HIGH (ref 80.0–100.0)
Monocytes Absolute: 0.5 10*3/uL (ref 0.1–1.0)
Monocytes Relative: 9 %
Neutro Abs: 4 10*3/uL (ref 1.7–7.7)
Neutrophils Relative %: 63 %
Platelet Count: 161 10*3/uL (ref 150–400)
RBC: 3.12 MIL/uL — ABNORMAL LOW (ref 4.22–5.81)
RDW: 18.8 % — ABNORMAL HIGH (ref 11.5–15.5)
WBC Count: 6.1 10*3/uL (ref 4.0–10.5)
nRBC: 0 % (ref 0.0–0.2)

## 2019-03-23 LAB — CMP (CANCER CENTER ONLY)
ALT: 19 U/L (ref 0–44)
AST: 24 U/L (ref 15–41)
Albumin: 2.8 g/dL — ABNORMAL LOW (ref 3.5–5.0)
Alkaline Phosphatase: 116 U/L (ref 38–126)
Anion gap: 9 (ref 5–15)
BUN: 14 mg/dL (ref 8–23)
CO2: 23 mmol/L (ref 22–32)
Calcium: 8.7 mg/dL — ABNORMAL LOW (ref 8.9–10.3)
Chloride: 110 mmol/L (ref 98–111)
Creatinine: 0.98 mg/dL (ref 0.61–1.24)
GFR, Est AFR Am: >60 mL/min (ref >60)
GFR, Estimated: >60 mL/min (ref >60)
Glucose, Bld: 107 mg/dL — ABNORMAL HIGH (ref 70–99)
Potassium: 4 mmol/L (ref 3.5–5.1)
Sodium: 142 mmol/L (ref 135–145)
Total Bilirubin: <0.2 mg/dL — ABNORMAL LOW (ref 0.3–1.2)
Total Protein: 6.7 g/dL (ref 6.5–8.1)

## 2019-03-23 MED ORDER — MAGNESIUM OXIDE 400 (241.3 MG) MG PO TABS: 400.0000 mg | ORAL_TABLET | Freq: Every day | ORAL | 1 refills | Status: DC

## 2019-03-23 MED ORDER — SODIUM CHLORIDE 0.9% FLUSH
10.0000 mL | INTRAVENOUS | Status: DC | PRN
  Administered 2019-03-23: 10 mL
  Filled 2019-03-23: qty 10

## 2019-03-23 MED ORDER — SODIUM CHLORIDE 0.9 % IV SOLN
5.4000 mg/kg | Freq: Once | INTRAVENOUS | Status: AC
  Administered 2019-03-23: 600 mg via INTRAVENOUS
  Filled 2019-03-23: qty 20

## 2019-03-23 MED ORDER — HEPARIN SOD (PORK) LOCK FLUSH 100 UNIT/ML IV SOLN
500.0000 [IU] | Freq: Once | INTRAVENOUS | Status: AC | PRN
  Administered 2019-03-23: 18:00:00 500 [IU]
  Filled 2019-03-23: qty 5

## 2019-03-23 MED ORDER — SODIUM CHLORIDE 0.9 % IV SOLN
Freq: Once | INTRAVENOUS | Status: AC
  Administered 2019-03-23: 16:00:00 via INTRAVENOUS
  Filled 2019-03-23: qty 250

## 2019-03-23 NOTE — Progress Notes (Signed)
Billy Mendoza   Telephone:([phone]) [phone] Fax:(336) [phone]   Clinic Follow up Note   Patient Care Team: Billy Proud, MD as PCP - General (Internal Medicine) Billy Merle, MD as Consulting Physician (Hematology) Billy Ada, MD as Consulting Physician (Gastroenterology) 03/23/2019  CHIEF COMPLAINT: f/u metastatic rectal cancer   SUMMARY OF ONCOLOGIC HISTORY: Oncology History Overview Note  Cancer Staging Rectal cancer metastasized to liver [facility]) Staging form: Colon and Rectum, AJCC 8th Edition - Clinical stage from 07/28/2018: Stage IVB (cTX, cNX, pM1b) - Signed by Billy Merle, MD on 08/18/2018     Rectal cancer metastasized to liver ([place])  07/28/2018 Initial Biopsy   Biopsy 07/28/18 Final Microscopic Diagnosis A.Colon, Ascending, Polyp:  -Fragments of tubular adenoma -No high-grade dysplasia of malignancy.  B. Rectum, Mass, Polyp:  -Invasive adenocarcinoma, moderately - poorly differentiatred.    07/28/2018 Cancer Staging   Staging form: Colon and Rectum, AJCC 8th Edition - Clinical stage from 07/28/2018: Stage IVB (cTX, cNX, pM1b) - Signed by Billy Merle, MD on 08/18/2018   08/04/2018 Initial Diagnosis   Rectal cancer metastasized to liver ([place])   08/16/2018 Pathology Results   Biospy  Diagnosis 08/16/18 Liver, needle/core biopsy - ADENOCARCINOMA. Microscopic Comment By morphology, the findings are compatible with the provided clinical history of colorectal adenocarcinoma. Results reported to Dr. Truitt Mendoza on 08/18/2018. Intradepartmental consultation was obtained (Dr. Tresa Mendoza).    08/16/2018 Miscellaneous   Foundation 1 genomic testing: MS-stable Tumor mutation burden  3/Mb K-ras/NRAS wild-type BRAF V600 E KDMSA R266Q PTEN T331f* SMAD4 loss TP53 loss     08/18/2018 Procedure   Colonoscopy by Billy Mendoza malignant appearing partially obstructing tumor in the rectum, 8 cm above anal verge.   08/18/2018 - 09/07/2018 Chemotherapy   CAPOX  q3weeks with Xeloda 20059mBID 2 weeks on, 1 weeks off starting 08/18/18.  Swithced treatment due to BRAF mutation.    09/07/2018 - 12/22/2018 Chemotherapy   FOLFOXIRI and avastin every 2 weeks starting 09/07/18. Oxaliplatin held starting with cycle 8 and continue with FOLFIRI and Avastin. D/c due to disease progression.    11/04/2018 Imaging   CT CAP 11/04/18 IMPRESSION: 1. Peripheral filling defect in the right main pulmonary artery extending primarily into the right lower lobe pulmonary artery. While the peripheral location indicates some degree of chronicity of this pulmonary embolus, this is a new finding compared to 07/28/2018. The right ventricular to left ventricular ratio is 0.7, which does not specifically indicate right heart strain. 2. The malignancy appears considerably improved, with reduced size pulmonary nodules; reduced size and increased internal necrosis of the hepatic metastatic lesions; marked improvement in the thoracic adenopathy; reduced conspicuity of the rectal tumor; and significant reduction in perirectal tumor burden/adenopathy. 3. Other imaging findings of potential clinical significance: Aortic Atherosclerosis (ICD10-I70.0). Coronary atherosclerosis. Bilateral renal cysts. Small umbilical hernia contains adipose tissue.  Critical Value/emergent results were called by telephone at the time of interpretation on 11/04/2018 at 3:05 pm to Dr. YATruitt Mendoza who verbally acknowledged these results.   01/18/2019 Imaging   CT AP W contrast  IMPRESSION: 1. Colonic obstruction resulting from rectal carcinoma. 2. Hepatic metastatic disease which is decreased in size compared with the prior examination of 11/04/2018. 3. Multiple right middle lobe and right lower lobe pulmonary nodules most consistent with metastatic disease.   02/01/2019 -  Chemotherapy   second line BRAFTOVI(encorafenib) 30011maily and MEKTOVI(binimetinib), 109m6m2h and Vectibix q2weeks starting  02/01/19.  -[facility]  02/13/2019 Imaging   CT Chest  IMPRESSION: Small bilateral pulmonary metastases, mildly progressive from January 2020.  Multifocal hepatic metastases, incompletely visualized, favored to be progressive from April 2020.  Aortic Atherosclerosis (ICD10-I70.0).    02/22/2019 -  Radiation Therapy   -Palliative local radiation  -Holding mektovi (binimetinib) during RT     CURRENT THERAPY: Second lineBRAFTOVI(encorafenib)318m dailyand MEKTOVI(binimetinib), 489mq12hand Vectibix q2weeks starting4/29/20. Held MEKTOVI starting 02/19/19 due to RT. -Radiation5/20/2006/16/20. Stopped RT after 03/13/19 due to pain; resumed and completed last 2 treatments on 6/15 and 6/16  INTERVAL HISTORY: Mr. BiBommaritoeturns for follow up as scheduled. Panitumumab was delayed last week due to severe rectal pain. He continues to hold braftovi and mektovi. He feels better today. Rates pain 2-3/10 on MS contin. He has not needed oxy since yesterday morning. He plans to wean himself off morphine soon. He saw Billy Mendoza week, per patient the stent has not migrated. He has an anal fissure. Denies rectal bleeding. Had soft BM 1 day ago. Denies n/v. Appetite is good. Fatigue is improving. Denies fever, chills, cough, chest pain, dyspnea, or skin rash.    MEDICAL HISTORY:  Past Medical History:  [facility]osis Date  [facility]    SURGICAL HISTORY: Past Surgical History:  Procedure Laterality Date  . COLONIC STENT PLACEMENT N/A 01/19/2019   Procedure: COLONIC STENT PLACEMENT;  Surgeon: HuCarol AdaMD;  Location: [facility];  Service: Endoscopy;  Laterality: N/A;  . FLEXIBLE SIGMOIDOSCOPY N/A 01/19/2019   Procedure: FLEXIBLE SIGMOIDOSCOPY;  Surgeon: HuCarol AdaMD;  Location: [facility];  Service: Endoscopy;  Laterality: N/A;  . IR IMAGING GUIDED PORT INSERTION  08/16/2018  . IR [place] BX ASP/DRAIN  08/16/2018    I have reviewed the  social history and family history with the patient and they are unchanged from previous note.  ALLERGIES:  has No Known Allergies.  MEDICATIONS:  Current Outpatient Medications  Medication Sig Dispense Refill  . acetaminophen (TYLENOL) 325 MG tablet Take 650 mg by mouth every 6 (six) hours as needed for moderate pain.    . Binimetinib (MEKTOVI) 15 MG TABS Take 45 mg by mouth 2 (two) times a day. (Patient not taking: Reported on 03/16/2019) 180 tablet 3  . clindamycin (CLINDAGEL) 1 % gel Apply topically 2 (two) times daily. 60 g 0  . diphenoxylate-atropine (LOMOTIL) 2.5-0.025 MG tablet Take 1-2 tablets by mouth 4 (four) times daily as needed for diarrhea or loose stools. Up to 8 tabs daily for severe diarrhea 60 tablet 2  . doxycycline (VIBRA-TABS) 100 MG tablet TAKE 1 TABLET(100 MG) BY MOUTH TWICE DAILY 60 tablet 0  . Encorafenib (BRAFTOVI) 75 MG CAPS Take 300 mg by mouth daily. 120 capsule 3  . magnesium oxide (MAG-OX) 400 (241.3 Mg) MG tablet Take 1 tablet (400 mg total) by mouth daily. 30 tablet 1  . morphine (MS CONTIN) 15 MG 12 hr tablet Take 1 tablet (15 mg total) by mouth every 12 (twelve) hours. 30 tablet 0  . oxyCODONE (OXY IR/ROXICODONE) 5 MG immediate release tablet Take 1-2 tablets (5-10 mg total) by mouth every 4 (four) hours as needed for severe pain. 90 tablet 0  . potassium chloride SA (K-DUR) 20 MEQ tablet Take 1 tablet (20 mEq total) by mouth 2 (two) times daily. 60 tablet 3  . Rivaroxaban (XARELTO) 15 MG TABS tablet Take 1 tablet (15 mg total) by mouth daily with supper. 30 tablet 1  . traMADol (ULTRAM) 50 MG tablet Take 1 tablet (  50 mg total) by mouth every 6 (six) hours as needed. 30 tablet 0  . VITAMIN E PO Take 1 tablet by mouth daily.     No current facility-administered medications for this visit.    Facility-Administered Medications Ordered in Other Visits  Medication Dose Route Frequency Provider Last Rate Last Dose  . heparin lock flush 100 unit/mL  500 Units  Intracatheter Once PRN Billy Merle, MD      . panitumumab (VECTIBIX) 600 mg in sodium chloride 0.9 % 100 mL chemo infusion  5.4 mg/kg (Treatment Plan Recorded) Intravenous Once Billy Merle, MD      . sodium chloride flush (NS) 0.9 % injection 10 mL  10 mL Intracatheter PRN Billy Merle, MD        PHYSICAL EXAMINATION: ECOG PERFORMANCE STATUS: 1-2  Vitals:   03/23/19 1511  BP: 118/88  Pulse: 86  Resp: 18  Temp: 98.7 F (37.1 C)  SpO2: 98%   Filed Weights   03/23/19 1511  Weight: 233 lb 3.2 oz (105.8 kg)    GENERAL:alert, no distress and comfortable SKIN: face is flushed, no acne skin rash  EYES:  sclera clear LUNGS: respirations even and unlabored  Cardiovascular: no lower extremity edema Musculoskeletal:no cyanosis of digits NEURO: alert & oriented x 3 with fluent speech, normal gait PAC without erythema  Limited exam for covid19 outbreak  LABORATORY DATA:  I have reviewed the data as listed CBC Latest Ref Rng & Units 03/23/2019 03/16/2019 03/09/2019  WBC 4.0 - 10.5 K/uL 6.1 5.0 5.0  Hemoglobin 13.0 - 17.0 g/dL 10.2(L) 10.4(L) 9.6(L)  Hematocrit 39.0 - 52.0 % 33.6(L) 33.5(L) 32.0(L)  Platelets 150 - 400 K/uL 161 161 159     CMP Latest Ref Rng & Units 03/23/2019 03/16/2019 03/09/2019  Glucose 70 - 99 mg/dL 107(H) 113(H) 93  BUN 8 - 23 mg/dL _0 Creatinine 0.61 - 1.24 mg/dL 0.98 0.97 0.82  Sodium 135 - 145 mmol/L 142 138 139  Potassium 3.5 - 5.1 mmol/L 4.0 4.4 4.5  Chloride 98 - 111 mmol/L 110 109 111  CO2 22 - 32 mmol/L 23 20(L) 20(L)  Calcium 8.9 - 10.3 mg/dL 8.7(L) 8.7(L) 8.5(L)  Total Protein 6.5 - 8.1 g/dL 6.7 6.9 6.7  Total Bilirubin 0.3 - 1.2 mg/dL <0.2(L) 0.4 0.4  Alkaline Phos 38 - 126 U/L 116 104 101  AST 15 - 41 U/L _1 ALT 0 - 44 U/L _2 RADIOGRAPHIC STUDIES: I have personally reviewed the radiological images as listed and agreed with the findings in the report. No results found.   ASSESSMENT & PLAN: Billy Mendoza a 68 y.o.malewith    1. Proximal rectal adenocarcinoma, metastatic to liver and likely tolungsandnodes, MSS,KRAS/NRAS wild type,BRAF V600E mutation (+) 2. Acne-type skin rash 3.Recent bowel obstruction from rectal tumor  4. Rectal bleeding 5. Iron deficiency anemia 6.History of right PE 7. Pancytopeniasecondarytochemotherapy 8. HTN 6. Goal of care discussion -treatment goal is palliative;full code now  Billy Mendoza appears well. He completed palliative radiation. His rectal pain is improved. He plans to wean himself off MS contin over the next few days. He has tramadol for mild to moderate pain and oxycodone for severe pain if needed. He will resume therapy with panitumumab today, which he tolerates well overall. He will restart braftovi (encorafenib) tomorrow. He will continue holding mektovi (binimetinib) for 2 weeks until next f/u. I reviewed the plan with Dr. Burr Medico.  Labs reviewed. CBC and  CMP stable. Recent iron panel shows low serum iron and transferrin saturation, normal ferritin. I suggest he start taking oral iron 1 tab daily. He does not need IV Feraheme. Mg 1.6 on 03/16/19. I sent in prescription for mag-ox 400 mg 1 tab daily. He understands. Pharmacy and infusion RN aware, OK to treat.   He will return for f/u and panitumumab in 2 weeks.   All questions were answered. The patient knows to call the clinic with any problems, questions or concerns. No barriers to learning was detected.     Billy Feeling, NP 03/23/19

## 2019-03-23 NOTE — Patient Instructions (Signed)
[facility] Discharge Instructions for Patients Receiving Chemotherapy  Today you received the following chemotherapy agents Panitumumab (VECTIBIX).  To help prevent nausea and vomiting after your treatment, we encourage you to take your nausea medication as prescribed.   If you develop nausea and vomiting that is not controlled by your nausea medication, call the clinic.   BELOW ARE SYMPTOMS THAT SHOULD BE REPORTED IMMEDIATELY:  *FEVER GREATER THAN 100.5 F  *CHILLS WITH OR WITHOUT FEVER  NAUSEA AND VOMITING THAT IS NOT CONTROLLED WITH YOUR NAUSEA MEDICATION  *UNUSUAL SHORTNESS OF BREATH  *UNUSUAL BRUISING OR BLEEDING  TENDERNESS IN MOUTH AND THROAT WITH OR WITHOUT PRESENCE OF ULCERS  *URINARY PROBLEMS  *BOWEL PROBLEMS  UNUSUAL RASH Items with * indicate a potential emergency and should be followed up as soon as possible.  Feel free to call the clinic should you have any questions or concerns. The clinic phone number is [phone].  Please show the [place] at check-in to the Emergency Department and triage nurse.  Coronavirus (COVID-19) Are you at risk?  Are you at risk for the Coronavirus (COVID-19)?  To be considered HIGH RISK for Coronavirus (COVID-19), you have to meet the following criteria:  . Traveled to [place], [place], [place], [place] or [place]; or in the [place] to [place], [place], [place], or [place]; and have fever, cough, and shortness of breath within the last 2 weeks of travel OR . Been in close contact with a person diagnosed with COVID-19 within the last 2 weeks and have fever, cough, and shortness of breath . IF YOU DO NOT MEET THESE CRITERIA, YOU ARE CONSIDERED LOW RISK FOR COVID-19.  What to do if you are HIGH RISK for COVID-19?  Marland Kitchen If you are having a medical emergency, call 911. . Seek medical care right away. Before you go to a doctor's office, urgent care or emergency department, call ahead and tell them  about your recent travel, contact with someone diagnosed with COVID-19, and your symptoms. You should receive instructions from your physician's office regarding next steps of care.  . When you arrive at healthcare provider, tell the healthcare staff immediately you have returned from visiting [place], [place], [place], [place] or [place]; or traveled in the [place] to [place], [place], [place], or [place]; in the last two weeks or you have been in close contact with a person diagnosed with COVID-19 in the last 2 weeks.   . Tell the health care staff about your symptoms: fever, cough and shortness of breath. . After you have been seen by a medical provider, you will be either: o Tested for (COVID-19) and discharged home on quarantine except to seek medical care if symptoms worsen, and asked to  - Stay home and avoid contact with others until you get your results (4-5 days)  - Avoid travel on public transportation if possible (such as bus, train, or airplane) or o Sent to the Emergency Department by EMS for evaluation, COVID-19 testing, and possible admission depending on your condition and test results.  What to do if you are LOW RISK for COVID-19?  Reduce your risk of any infection by using the same precautions used for avoiding the common cold or flu:  Marland Kitchen Wash your hands often with soap and warm water for at least 20 seconds.  If soap and water are not readily available, use an alcohol-based hand sanitizer with at least 60% alcohol.  . If coughing or  sneezing, cover your mouth and nose by coughing or sneezing into the elbow areas of your shirt or coat, into a tissue or into your sleeve (not your hands). . Avoid shaking hands with others and consider head nods or verbal greetings only. . Avoid touching your eyes, nose, or mouth with unwashed hands.  . Avoid close contact with people who are sick. . Avoid places or events with large numbers of people in one location, like concerts or  sporting events. . Carefully consider travel plans you have or are making. . If you are planning any travel outside or inside the [place], visit the CDC's Travelers' Health webpage for the latest health notices. . If you have some symptoms but not all symptoms, continue to monitor at home and seek medical attention if your symptoms worsen. . If you are having a medical emergency, call 911.   [place] / e-Visit: [email or web address]         [facility]: [facility]: [phone]                   [facility]: [phone]

## 2019-03-23 NOTE — Progress Notes (Signed)
Spoke w/ Regan Rakers, patient will initiate oral magnesium supplementation at home for level of 1.6 last week on vectibix.   Demetrius Charity, PharmD, [place] [facility] Pharmacist Pharmacy Phone: [phone] 03/23/2019

## 2019-03-24 ENCOUNTER — Telehealth: Payer: Self-pay | Admitting: Nurse Practitioner

## 2019-03-24 NOTE — Telephone Encounter (Signed)
Scheduled appt per 6/18 los. °

## 2019-03-30 ENCOUNTER — Ambulatory Visit: Payer: BC Managed Care – PPO

## 2019-03-30 ENCOUNTER — Other Ambulatory Visit: Payer: BC Managed Care – PPO

## 2019-03-30 ENCOUNTER — Ambulatory Visit: Payer: BC Managed Care – PPO | Admitting: Nurse Practitioner

## 2019-04-03 ENCOUNTER — Other Ambulatory Visit: Payer: Self-pay | Admitting: Nurse Practitioner

## 2019-04-03 ENCOUNTER — Telehealth: Payer: Self-pay

## 2019-04-03 MED ORDER — MORPHINE SULFATE ER 15 MG PO TBCR: 15.0000 mg | EXTENDED_RELEASE_TABLET | Freq: Two times a day (BID) | ORAL | 0 refills | Status: DC

## 2019-04-03 NOTE — Telephone Encounter (Signed)
Patient's wife calls requesting refill on MS Contin, last filled on 6/11 #30 to be sent into [facility] on file.

## 2019-04-03 NOTE — Telephone Encounter (Signed)
Patient's wife calls for refill on MS Contin last filled 6/11 #30 to be sent into [facility] on file.

## 2019-04-04 NOTE — Progress Notes (Signed)
Billy Mendoza   Telephone:([phone] Fax:(336) [phone]   Clinic Follow up Note   Patient Care Team: Merry Proud, MD as PCP - General (Internal Medicine) Truitt Merle, MD as Consulting Physician (Hematology) Carol Ada, MD as Consulting Physician (Gastroenterology)  Date of Service:  04/06/2019  CHIEF COMPLAINT: F/u of rectal cancer metastasized to liver  SUMMARY OF ONCOLOGIC HISTORY: Oncology History Overview Note  Cancer Staging Rectal cancer metastasized to liver [facility]) Staging form: Colon and Rectum, AJCC 8th Edition - Clinical stage from 07/28/2018: Stage IVB (cTX, cNX, pM1b) - Signed by Truitt Merle, MD on 08/18/2018     Rectal cancer metastasized to liver ([place])  07/28/2018 Initial Biopsy   Biopsy 07/28/18 Final Microscopic Diagnosis A.Colon, Ascending, Polyp:  -Fragments of tubular adenoma -No high-grade dysplasia of malignancy.  B. Rectum, Mass, Polyp:  -Invasive adenocarcinoma, moderately - poorly differentiatred.    07/28/2018 Cancer Staging   Staging form: Colon and Rectum, AJCC 8th Edition - Clinical stage from 07/28/2018: Stage IVB (cTX, cNX, pM1b) - Signed by Truitt Merle, MD on 08/18/2018   08/04/2018 Initial Diagnosis   Rectal cancer metastasized to liver ([place])   08/16/2018 Pathology Results   Biospy  Diagnosis 08/16/18 Liver, needle/core biopsy - ADENOCARCINOMA. Microscopic Comment By morphology, the findings are compatible with the provided clinical history of colorectal adenocarcinoma. Results reported to [name] on 08/18/2018. Intradepartmental consultation was obtained ([name]).    08/16/2018 Miscellaneous   Foundation 1 genomic testing: MS-stable Tumor mutation burden  3/Mb K-ras/NRAS wild-type BRAF V600 E KDMSA R266Q PTEN T315f* SMAD4 loss TP53 loss     08/18/2018 Procedure   Colonoscopy by Dr. HBenson Norwayshowed malignant appearing partially obstructing tumor in the rectum, 8 cm above anal verge.   08/18/2018 -  09/07/2018 Chemotherapy   CAPOX q3weeks with Xeloda 2009mBID 2 weeks on, 1 weeks off starting 08/18/18.  Swithced treatment due to BRAF mutation.    09/07/2018 - 12/24/2018 Chemotherapy   FOLFOXIRI and avastin every 2 weeks starting 09/07/18. Oxaliplatin held starting with cycle 8 and continue with FOLFIRI and Avastin. D/c due to disease progression.    11/04/2018 Imaging   CT CAP 11/04/18 IMPRESSION: 1. Peripheral filling defect in the right main pulmonary artery extending primarily into the right lower lobe pulmonary artery. While the peripheral location indicates some degree of chronicity of this pulmonary embolus, this is a new finding compared to 07/28/2018. The right ventricular to left ventricular ratio is 0.7, which does not specifically indicate right heart strain. 2. The malignancy appears considerably improved, with reduced size pulmonary nodules; reduced size and increased internal necrosis of the hepatic metastatic lesions; marked improvement in the thoracic adenopathy; reduced conspicuity of the rectal tumor; and significant reduction in perirectal tumor burden/adenopathy. 3. Other imaging findings of potential clinical significance: Aortic Atherosclerosis (ICD10-I70.0). Coronary atherosclerosis. Bilateral renal cysts. Small umbilical hernia contains adipose tissue.  Critical Value/emergent results were called by telephone at the time of interpretation on 11/04/2018 at 3:05 pm to Dr. YATruitt Merle who verbally acknowledged these results.   01/18/2019 Imaging   CT AP W contrast  IMPRESSION: 1. Colonic obstruction resulting from rectal carcinoma. 2. Hepatic metastatic disease which is decreased in size compared with the prior examination of 11/04/2018. 3. Multiple right middle lobe and right lower lobe pulmonary nodules most consistent with metastatic disease.   02/01/2019 -  Chemotherapy   second line BRAFTOVI(encorafenib) 30066maily and MEKTOVI(binimetinib), 73m20m2h and  Vectibix q2weeks starting 02/01/19.  -He has held  Mektovi 02/19/19-04/06/19 due to RT    02/13/2019 Imaging   CT Chest  IMPRESSION: Small bilateral pulmonary metastases, mildly progressive from January 2020.  Multifocal hepatic metastases, incompletely visualized, favored to be progressive from April 2020.  Aortic Atherosclerosis (ICD10-I70.0).    02/22/2019 - 03/21/2019 Radiation Therapy   -Palliative local radiation  -Holding mektovi (binimetinib) during RT -Due to significant rectal pain, diarrhea and stool incontinence, he stopped RT after 03/13/19. He was only able to finish 1/2 remianing treatments on 03/21/19.       CURRENT THERAPY:  -Second lineBRAFTOVI(encorafenib)325m dailyand MEKTOVI(binimetinib) 486mq12hand Vectibix q2weeks starting4/29/20. Held MEKTOVI 02/19/19-04/06/19 due to RT.  INTERVAL HISTORY:  Billy Mendoza here for a follow up and treatment. He presents to the clinic alone. He notes he is doing well and he has recovered from RT. He notes he saw Dr HuBenson Norwayho notes the pain he was having was from fissure, his stent was in good placement. He notes he has not needed pain medication for 3 days. He has taken Tylenol. He feels his Stent is his main pain and is down to 1-3/20. He has been taking Colase every 1-2 days to control his constipation. He denies diarrhea. He notes his acne rash has overall resolved after doxycycline. He notes his appetite is fair. He notes he has returned to work.     REVIEW OF SYSTEMS:  Constitutional: Denies fevers, chills or abnormal weight loss (+) appetite fair Eyes: Denies blurriness of vision Ears, nose, mouth, throat, and face: Denies mucositis or sore throat Respiratory: Denies cough, dyspnea or wheezes Cardiovascular: Denies palpitation, chest discomfort or lower extremity swelling Gastrointestinal:  Denies nausea, heartburn or change in bowel habits (+) pain at stent down to 1-3/20 Skin: Denies abnormal skin rashes Lymphatics:  Denies new lymphadenopathy or easy bruising Neurological:Denies numbness, tingling or new weaknesses Behavioral/Psych: Mood is stable, no new changes  All other systems were reviewed with the patient and are negative.  MEDICAL HISTORY:  Past Medical History:  Diagnosis Date   [facility]    SURGICAL HISTORY: Past Surgical History:  Procedure Laterality Date   COLONIC STENT PLACEMENT N/A 01/19/2019   Procedure: COLONIC STENT PLACEMENT;  Surgeon: HuCarol AdaMD;  Location: [facility];  Service: Endoscopy;  Laterality: N/A;   FLEXIBLE SIGMOIDOSCOPY N/A 01/19/2019   Procedure: FLEXIBLE SIGMOIDOSCOPY;  Surgeon: HuCarol AdaMD;  Location: [facility];  Service: Endoscopy;  Laterality: N/A;   IR IMAGING GUIDED PORT INSERTION  08/16/2018   IR [place] BX ASP/DRAIN  08/16/2018    I have reviewed the social history and family history with the patient and they are unchanged from previous note.  ALLERGIES:  has No Known Allergies.  MEDICATIONS:  Current Outpatient Medications  Medication Sig Dispense Refill   acetaminophen (TYLENOL) 325 MG tablet Take 650 mg by mouth every 6 (six) hours as needed for moderate pain.     Binimetinib (MEKTOVI) 15 MG TABS Take 45 mg by mouth 2 (two) times a day. 180 tablet 3   clindamycin (CLINDAGEL) 1 % gel Apply topically 2 (two) times daily. 60 g 0   diphenoxylate-atropine (LOMOTIL) 2.5-0.025 MG tablet Take 1-2 tablets by mouth 4 (four) times daily as needed for diarrhea or loose stools. Up to 8 tabs daily for severe diarrhea 60 tablet 2   doxycycline (VIBRA-TABS) 100 MG tablet TAKE 1 TABLET(100 MG) BY MOUTH TWICE DAILY 60 tablet 0   Encorafenib (BRAFTOVI) 75 MG CAPS Take 300 mg by mouth daily. 120 capsule  3   magnesium oxide (MAG-OX) 400 (241.3 Mg) MG tablet Take 1 tablet (400 mg total) by mouth daily. 30 tablet 1   morphine (MS CONTIN) 15 MG 12 hr tablet Take 1 tablet (15 mg total) by mouth every 12 (twelve) hours. 60 tablet 0    oxyCODONE (OXY IR/ROXICODONE) 5 MG immediate release tablet Take 1-2 tablets (5-10 mg total) by mouth every 4 (four) hours as needed for severe pain. 90 tablet 0   potassium chloride SA (K-DUR) 20 MEQ tablet Take 1 tablet (20 mEq total) by mouth 2 (two) times daily. 60 tablet 3   Rivaroxaban (XARELTO) 15 MG TABS tablet Take 1 tablet (15 mg total) by mouth daily with supper. 30 tablet 1   traMADol (ULTRAM) 50 MG tablet Take 1 tablet (50 mg total) by mouth every 6 (six) hours as needed. 30 tablet 0   VITAMIN E PO Take 1 tablet by mouth daily.     No current facility-administered medications for this visit.    Facility-Administered Medications Ordered in Other Visits  Medication Dose Route Frequency Provider Last Rate Last Dose   panitumumab (VECTIBIX) 600 mg in sodium chloride 0.9 % 100 mL chemo infusion  5.4 mg/kg (Treatment Plan Recorded) Intravenous Once Truitt Merle, MD        PHYSICAL EXAMINATION: ECOG PERFORMANCE STATUS: 1 - Symptomatic but completely ambulatory  Vitals:   04/06/19 1332  BP: 120/76  Pulse: 88  Resp: 18  Temp: 97.7 F (36.5 C)  SpO2: 100%   Filed Weights   04/06/19 1332  Weight: 234 lb 12.8 oz (106.5 kg)    GENERAL:alert, no distress and comfortable SKIN: skin color, texture, turgor are normal, no rashes or significant lesions EYES: normal, Conjunctiva are pink and non-injected, sclera clear  NECK: supple, thyroid normal size, non-tender, without nodularity LYMPH:  no palpable lymphadenopathy in the cervical, axillary  LUNGS: clear to auscultation and percussion with normal breathing effort HEART: regular rate & rhythm and no murmurs and no lower extremity edema ABDOMEN:abdomen soft, non-tender and normal bowel sounds Musculoskeletal:no cyanosis of digits and no clubbing  NEURO: alert & oriented x 3 with fluent speech, no focal motor/sensory deficits  LABORATORY DATA:  I have reviewed the data as listed CBC Latest Ref Rng & Units 04/06/2019 03/23/2019  03/16/2019  WBC 4.0 - 10.5 K/uL 5.2 6.1 5.0  Hemoglobin 13.0 - 17.0 g/dL 10.6(L) 10.2(L) 10.4(L)  Hematocrit 39.0 - 52.0 % 33.2(L) 33.6(L) 33.5(L)  Platelets 150 - 400 K/uL 222 161 161     CMP Latest Ref Rng & Units 04/06/2019 03/23/2019 03/16/2019  Glucose 70 - 99 mg/dL 121(H) 107(H) 113(H)  BUN 8 - 23 mg/dL 22 14 14   Creatinine 0.61 - 1.24 mg/dL 0.98 0.98 0.97  Sodium 135 - 145 mmol/L 138 142 138  Potassium 3.5 - 5.1 mmol/L 4.0 4.0 4.4  Chloride 98 - 111 mmol/L 110 110 109  CO2 22 - 32 mmol/L 19(L) 23 20(L)  Calcium 8.9 - 10.3 mg/dL 8.1(L) 8.7(L) 8.7(L)  Total Protein 6.5 - 8.1 g/dL 6.5 6.7 6.9  Total Bilirubin 0.3 - 1.2 mg/dL <0.2(L) <0.2(L) 0.4  Alkaline Phos 38 - 126 U/L 135(H) 116 104  AST 15 - 41 U/L 21 24 16   ALT 0 - 44 U/L 17 19 14       RADIOGRAPHIC STUDIES: I have personally reviewed the radiological images as listed and agreed with the findings in the report. No results found.   ASSESSMENT & PLAN:  Billy Mendoza is a  68 y.o. male with   1. Proximal rectal adenocarcinoma, metastatic to liver and likely tolungsandnodes, MSS,KRAS/NRAS wild type,BRAF V600E mutation (+) -He was diagnosed in10/2019.He completed 1 cycle of CAPOX before changing todose reducedFOLFOXIRI andAvastindue to BRAF mutation in his tumor. -Hismetastatic diseaseis not curablewith surgery, he will continue treatment for as long as he can tolerate to control disease. -He was hospitalized on 01/18/19 forbowel obstruction due to rectal cancer. He had stent placed by Dr. Benson Norway. His CT AP showed his known rectaltumor andobstruction, liver mets have decreased in size and multiple new right lung nodules consistent with metastasis. -I have changed his chemo to second-lineoralBRAF/MEK/EGFR inhibitors(Braftovi, Mektovi andVectibix q2weeks)which he started on 02/01/19. -He started Radiation 02/22/19. Due to significant rectal pain, diarrhea and stool incontinence, he held RT for 3-4 days and  completed on 03/21/19. Braftovi and Vectibix was held briefly also and restarted  -Irean Hong was held 02/19/19-04/06/19 due to RT. Will restart today. He has recovered well, rectal pain much improved  -Lab reviewed today, CBC and CMP WNL except Hg 10.6, BG 121, Ca 8.1, albumin 2.7, alk phos 135. Mag, Urine protein and Ferritin normal. CEA  At 15.64. Overall adequate to proceed with Vectibix today.  -I encouraged him to increase protein in diet or with powder.  -F/u in 2 weeks   2.Recent bowel obstruction from rectal tumor, severe rectal pain   -s/p stent placement by Dr. Benson Norway on 01/19/2019,he declined loop colostomybut agree to keep surgery as last resort.  -He understands thehigh possibility of recurrent bowel obstruction due to tumor progression.  -He has met with dietician. He will continue with low residual diet. -For constipation he will continue Colase every 1-2 days to prevent constipation/SBO. He denies diarrhea. He still has imodium and laxatives for any severe changes.  -Tramadol was not controlling rectal pain. He was temporarily on long acting MS Contin 68m BID.  -His 03/2019 f/u with Dr HBenson Norwayshowed his fissure was the main cause of his rectal pain.  -After short course palliative radiation 5-03/2019, his main pain is from stent, now mostly at 1-3/20. He has only needed Tylenol lately. He still has oxycodone 53mq6hours as needed for his significant pain.   3. Rectal bleeding -likely related to tumor bleeding and anticoagulation (Xarelto 20 mg daily)- bleeding improved with lower dose, he is on 1559maily now -Has occurred intermittently since diagnosis, improves for short period after treatments -He will hold Xarelto for moderate bleeding with clots. For mild bleeding he can continue Xarelto.   4. Iron deficiency anemia -secondary to blood loss from tumor  -ser iron 29, TIBC 369, 8% saturation ratio, consistent with iron deficiency  -He can start prenatal vitamin with iron  once daily -He received initial 2 doses of IV Feraheme on 02/22/19 and 03/02/19.  -Hg at 10.6 today (04/06/19), ferritin normal today   5.History of right PE -His PE is likely related to his underlying metastatic colon cancer and chemotherapy, especially Avastin. -He is on Xarelto 20 mg daily and will continue indefinitely. Has occasionally epistaxis secondary to Avastin,thrombocytopenia and Xarelto.  6. Pancytopeniasecondarytochemotherapy -Will monitor, overall improved -WBC and PLT normal, Hg 10.6 today, stable (04/06/19)  7. HTN  -TakesAmlodipine -BPoverall controlled   8. Acne-type skin rash -secondary toPanitumumab,developed after cycle 1 -will continue clindamycin gel BID and hydrocortisone and add doxycycline 100 mg BID -rash is drying out some,intermittent with infusion.Very mild currently  -Rash has resolved after cycle 1 dose and doxycycline.   9. Goal of care discussion -He  is full code now -He understands his disease is incurable,and the goal of therapy is palliative to prolong his life.   Plan -Labs reviewed and adequate to proceed with Vectibix today -continue Braftovi and restart Mektovi today -Lab, flush, f/u an Vectibix in 2 weeks     No problem-specific Assessment & Plan notes found for this encounter.   No orders of the defined types were placed in this encounter.  All questions were answered. The patient knows to call the clinic with any problems, questions or concerns. No barriers to learning was detected. I spent 20 minutes counseling the patient face to face. The total time spent in the appointment was 25 minutes and more than 50% was on counseling and review of test results     Truitt Merle, MD 04/06/2019   I, Joslyn Devon, am acting as scribe for Truitt Merle, MD.   I have reviewed the above documentation for accuracy and completeness, and I agree with the above.

## 2019-04-06 ENCOUNTER — Inpatient Hospital Stay: Payer: BC Managed Care – PPO

## 2019-04-06 ENCOUNTER — Other Ambulatory Visit: Payer: Self-pay

## 2019-04-06 ENCOUNTER — Inpatient Hospital Stay: Payer: BC Managed Care – PPO | Attending: Adult Health

## 2019-04-06 ENCOUNTER — Inpatient Hospital Stay (HOSPITAL_BASED_OUTPATIENT_CLINIC_OR_DEPARTMENT_OTHER): Payer: BC Managed Care – PPO | Admitting: Hematology

## 2019-04-06 VITALS — BP 120/76 | HR 88 | Temp 97.7°F | Resp 18 | Wt 234.8 lb

## 2019-04-06 DIAGNOSIS — C7801 Secondary malignant neoplasm of right lung: Secondary | ICD-10-CM | POA: Diagnosis not present

## 2019-04-06 DIAGNOSIS — C20 Malignant neoplasm of rectum: Secondary | ICD-10-CM | POA: Diagnosis not present

## 2019-04-06 DIAGNOSIS — Z86711 Personal history of pulmonary embolism: Secondary | ICD-10-CM | POA: Diagnosis not present

## 2019-04-06 DIAGNOSIS — Z5112 Encounter for antineoplastic immunotherapy: Secondary | ICD-10-CM | POA: Diagnosis not present

## 2019-04-06 DIAGNOSIS — D61810 Antineoplastic chemotherapy induced pancytopenia: Secondary | ICD-10-CM | POA: Diagnosis not present

## 2019-04-06 DIAGNOSIS — D509 Iron deficiency anemia, unspecified: Secondary | ICD-10-CM | POA: Insufficient documentation

## 2019-04-06 DIAGNOSIS — Z7901 Long term (current) use of anticoagulants: Secondary | ICD-10-CM | POA: Insufficient documentation

## 2019-04-06 DIAGNOSIS — I10 Essential (primary) hypertension: Secondary | ICD-10-CM | POA: Insufficient documentation

## 2019-04-06 DIAGNOSIS — R21 Rash and other nonspecific skin eruption: Secondary | ICD-10-CM | POA: Diagnosis not present

## 2019-04-06 DIAGNOSIS — C7802 Secondary malignant neoplasm of left lung: Secondary | ICD-10-CM | POA: Insufficient documentation

## 2019-04-06 DIAGNOSIS — L709 Acne, unspecified: Secondary | ICD-10-CM | POA: Diagnosis not present

## 2019-04-06 DIAGNOSIS — C787 Secondary malignant neoplasm of liver and intrahepatic bile duct: Secondary | ICD-10-CM | POA: Insufficient documentation

## 2019-04-06 DIAGNOSIS — Z95828 Presence of other vascular implants and grafts: Secondary | ICD-10-CM

## 2019-04-06 LAB — CBC WITH DIFFERENTIAL (CANCER CENTER ONLY)
Abs Immature Granulocytes: 0.11 10*3/uL — ABNORMAL HIGH (ref 0.00–0.07)
Basophils Absolute: 0 10*3/uL (ref 0.0–0.1)
Basophils Relative: 1 %
Eosinophils Absolute: 0.2 10*3/uL (ref 0.0–0.5)
Eosinophils Relative: 4 %
HCT: 33.2 % — ABNORMAL LOW (ref 39.0–52.0)
Hemoglobin: 10.6 g/dL — ABNORMAL LOW (ref 13.0–17.0)
Immature Granulocytes: 2 %
Lymphocytes Relative: 17 %
Lymphs Abs: 0.9 10*3/uL (ref 0.7–4.0)
MCH: 32.7 pg (ref 26.0–34.0)
MCHC: 31.9 g/dL (ref 30.0–36.0)
MCV: 102.5 fL — ABNORMAL HIGH (ref 80.0–100.0)
Monocytes Absolute: 0.9 10*3/uL (ref 0.1–1.0)
Monocytes Relative: 18 %
Neutro Abs: 3 10*3/uL (ref 1.7–7.7)
Neutrophils Relative %: 58 %
Platelet Count: 222 10*3/uL (ref 150–400)
RBC: 3.24 MIL/uL — ABNORMAL LOW (ref 4.22–5.81)
RDW: 18 % — ABNORMAL HIGH (ref 11.5–15.5)
WBC Count: 5.2 10*3/uL (ref 4.0–10.5)
nRBC: 0 % (ref 0.0–0.2)

## 2019-04-06 LAB — CEA (IN HOUSE-CHCC): CEA (CHCC-In House): 15.64 ng/mL — ABNORMAL HIGH (ref 0.00–5.00)

## 2019-04-06 LAB — CMP (CANCER CENTER ONLY)
ALT: 17 U/L (ref 0–44)
AST: 21 U/L (ref 15–41)
Albumin: 2.7 g/dL — ABNORMAL LOW (ref 3.5–5.0)
Alkaline Phosphatase: 135 U/L — ABNORMAL HIGH (ref 38–126)
Anion gap: 9 (ref 5–15)
BUN: 22 mg/dL (ref 8–23)
CO2: 19 mmol/L — ABNORMAL LOW (ref 22–32)
Calcium: 8.1 mg/dL — ABNORMAL LOW (ref 8.9–10.3)
Chloride: 110 mmol/L (ref 98–111)
Creatinine: 0.98 mg/dL (ref 0.61–1.24)
GFR, Est AFR Am: >60 mL/min (ref >60)
GFR, Estimated: >60 mL/min (ref >60)
Glucose, Bld: 121 mg/dL — ABNORMAL HIGH (ref 70–99)
Potassium: 4 mmol/L (ref 3.5–5.1)
Sodium: 138 mmol/L (ref 135–145)
Total Bilirubin: <0.2 mg/dL — ABNORMAL LOW (ref 0.3–1.2)
Total Protein: 6.5 g/dL (ref 6.5–8.1)

## 2019-04-06 LAB — TOTAL PROTEIN, URINE DIPSTICK: Protein, ur: NEGATIVE mg/dL

## 2019-04-06 LAB — MAGNESIUM: Magnesium: 1.7 mg/dL (ref 1.7–2.4)

## 2019-04-06 LAB — FERRITIN: Ferritin: 49 ng/mL (ref 24–336)

## 2019-04-06 MED ORDER — SODIUM CHLORIDE 0.9% FLUSH
10.0000 mL | INTRAVENOUS | Status: DC | PRN
  Administered 2019-04-06: 10 mL
  Filled 2019-04-06: qty 10

## 2019-04-06 MED ORDER — SODIUM CHLORIDE 0.9 % IV SOLN
Freq: Once | INTRAVENOUS | Status: AC
  Administered 2019-04-06: 14:00:00 via INTRAVENOUS
  Filled 2019-04-06: qty 250

## 2019-04-06 MED ORDER — SODIUM CHLORIDE 0.9 % IV SOLN
5.4000 mg/kg | Freq: Once | INTRAVENOUS | Status: AC
  Administered 2019-04-06: 600 mg via INTRAVENOUS
  Filled 2019-04-06: qty 20

## 2019-04-06 MED ORDER — HEPARIN SOD (PORK) LOCK FLUSH 100 UNIT/ML IV SOLN
500.0000 [IU] | Freq: Once | INTRAVENOUS | Status: AC | PRN
  Administered 2019-04-06: 16:00:00 500 [IU]
  Filled 2019-04-06: qty 5

## 2019-04-06 NOTE — Patient Instructions (Signed)
[facility] Discharge Instructions for Patients Receiving Chemotherapy  Today you received the following chemotherapy agents: Panitumumab (Vectibix)  To help prevent nausea and vomiting after your treatment, we encourage you to take your nausea medication as directed.   If you develop nausea and vomiting that is not controlled by your nausea medication, call the clinic.   BELOW ARE SYMPTOMS THAT SHOULD BE REPORTED IMMEDIATELY:  *FEVER GREATER THAN 100.5 F  *CHILLS WITH OR WITHOUT FEVER  NAUSEA AND VOMITING THAT IS NOT CONTROLLED WITH YOUR NAUSEA MEDICATION  *UNUSUAL SHORTNESS OF BREATH  *UNUSUAL BRUISING OR BLEEDING  TENDERNESS IN MOUTH AND THROAT WITH OR WITHOUT PRESENCE OF ULCERS  *URINARY PROBLEMS  *BOWEL PROBLEMS  UNUSUAL RASH Items with * indicate a potential emergency and should be followed up as soon as possible.  Feel free to call the clinic should you have any questions or concerns. The clinic phone number is [phone].  Please show the [place] at check-in to the Emergency Department and triage nurse.  Coronavirus (COVID-19) Are you at risk?  Are you at risk for the Coronavirus (COVID-19)?  To be considered HIGH RISK for Coronavirus (COVID-19), you have to meet the following criteria:  . Traveled to [place], [place], [place], [place] or [place]; or in the [place] to [place], [place], [place], or [place]; and have fever, cough, and shortness of breath within the last 2 weeks of travel OR . Been in close contact with a person diagnosed with COVID-19 within the last 2 weeks and have fever, cough, and shortness of breath . IF YOU DO NOT MEET THESE CRITERIA, YOU ARE CONSIDERED LOW RISK FOR COVID-19.  What to do if you are HIGH RISK for COVID-19?  Marland Kitchen If you are having a medical emergency, call 911. . Seek medical care right away. Before you go to a doctor's office, urgent care or emergency department, call ahead and tell them  about your recent travel, contact with someone diagnosed with COVID-19, and your symptoms. You should receive instructions from your physician's office regarding next steps of care.  . When you arrive at healthcare provider, tell the healthcare staff immediately you have returned from visiting [place], [place], [place], [place] or [place]; or traveled in the [place] to [place], [place], [place], or [place]; in the last two weeks or you have been in close contact with a person diagnosed with COVID-19 in the last 2 weeks.   . Tell the health care staff about your symptoms: fever, cough and shortness of breath. . After you have been seen by a medical provider, you will be either: o Tested for (COVID-19) and discharged home on quarantine except to seek medical care if symptoms worsen, and asked to  - Stay home and avoid contact with others until you get your results (4-5 days)  - Avoid travel on public transportation if possible (such as bus, train, or airplane) or o Sent to the Emergency Department by EMS for evaluation, COVID-19 testing, and possible admission depending on your condition and test results.  What to do if you are LOW RISK for COVID-19?  Reduce your risk of any infection by using the same precautions used for avoiding the common cold or flu:  Marland Kitchen Wash your hands often with soap and warm water for at least 20 seconds.  If soap and water are not readily available, use an alcohol-based hand sanitizer with at least 60% alcohol.  . If coughing or  sneezing, cover your mouth and nose by coughing or sneezing into the elbow areas of your shirt or coat, into a tissue or into your sleeve (not your hands). . Avoid shaking hands with others and consider head nods or verbal greetings only. . Avoid touching your eyes, nose, or mouth with unwashed hands.  . Avoid close contact with people who are sick. . Avoid places or events with large numbers of people in one location, like concerts or  sporting events. . Carefully consider travel plans you have or are making. . If you are planning any travel outside or inside the [place], visit the CDC's Travelers' Health webpage for the latest health notices. . If you have some symptoms but not all symptoms, continue to monitor at home and seek medical attention if your symptoms worsen. . If you are having a medical emergency, call 911.   [place] / e-Visit: [email or web address]         [facility]: [facility]: [phone]                   [facility]: [phone]

## 2019-04-07 ENCOUNTER — Encounter: Payer: Self-pay | Admitting: Hematology

## 2019-04-09 ENCOUNTER — Other Ambulatory Visit: Payer: Self-pay | Admitting: Nurse Practitioner

## 2019-04-10 ENCOUNTER — Telehealth: Payer: Self-pay | Admitting: Hematology

## 2019-04-10 NOTE — Telephone Encounter (Signed)
Scheduled appt per 7/2 los. °

## 2019-04-11 ENCOUNTER — Telehealth: Payer: Self-pay | Admitting: *Deleted

## 2019-04-11 NOTE — Telephone Encounter (Signed)
Received vm call from [facility] asking for CEA results.   Pt called back @ 3:57 pm & also asked for result via vm.  Called & left message with Carmell Austria that result in but Dr Burr Medico has not seen or commented on result & will send her a message that you are asking.  This nurse can't give report until MD has seen.

## 2019-04-12 ENCOUNTER — Other Ambulatory Visit: Payer: Self-pay | Admitting: Nurse Practitioner

## 2019-04-12 ENCOUNTER — Encounter: Payer: Self-pay | Admitting: Nurse Practitioner

## 2019-04-12 NOTE — Telephone Encounter (Signed)
Let pt know his CEA was 15.62, slightly higher than last time, but no big change, thanks   Truitt Merle MD

## 2019-04-20 ENCOUNTER — Inpatient Hospital Stay: Payer: BC Managed Care – PPO

## 2019-04-20 ENCOUNTER — Encounter: Payer: Self-pay | Admitting: Nurse Practitioner

## 2019-04-20 ENCOUNTER — Other Ambulatory Visit: Payer: Self-pay

## 2019-04-20 ENCOUNTER — Inpatient Hospital Stay (HOSPITAL_BASED_OUTPATIENT_CLINIC_OR_DEPARTMENT_OTHER): Payer: BC Managed Care – PPO | Admitting: Nurse Practitioner

## 2019-04-20 VITALS — BP 118/89 | HR 76 | Temp 99.1°F | Resp 18 | Ht 73.0 in | Wt 235.7 lb

## 2019-04-20 DIAGNOSIS — C20 Malignant neoplasm of rectum: Secondary | ICD-10-CM

## 2019-04-20 DIAGNOSIS — I10 Essential (primary) hypertension: Secondary | ICD-10-CM

## 2019-04-20 DIAGNOSIS — D509 Iron deficiency anemia, unspecified: Secondary | ICD-10-CM | POA: Diagnosis not present

## 2019-04-20 DIAGNOSIS — Z86711 Personal history of pulmonary embolism: Secondary | ICD-10-CM

## 2019-04-20 DIAGNOSIS — Z7901 Long term (current) use of anticoagulants: Secondary | ICD-10-CM

## 2019-04-20 DIAGNOSIS — Z5112 Encounter for antineoplastic immunotherapy: Secondary | ICD-10-CM | POA: Diagnosis not present

## 2019-04-20 DIAGNOSIS — C7801 Secondary malignant neoplasm of right lung: Secondary | ICD-10-CM | POA: Diagnosis not present

## 2019-04-20 DIAGNOSIS — D61810 Antineoplastic chemotherapy induced pancytopenia: Secondary | ICD-10-CM

## 2019-04-20 DIAGNOSIS — C787 Secondary malignant neoplasm of liver and intrahepatic bile duct: Secondary | ICD-10-CM

## 2019-04-20 DIAGNOSIS — D490 Neoplasm of unspecified behavior of digestive system: Secondary | ICD-10-CM

## 2019-04-20 DIAGNOSIS — C7802 Secondary malignant neoplasm of left lung: Secondary | ICD-10-CM | POA: Diagnosis not present

## 2019-04-20 DIAGNOSIS — Z95828 Presence of other vascular implants and grafts: Secondary | ICD-10-CM

## 2019-04-20 LAB — CMP (CANCER CENTER ONLY)
ALT: 18 U/L (ref 0–44)
AST: 23 U/L (ref 15–41)
Albumin: 2.6 g/dL — ABNORMAL LOW (ref 3.5–5.0)
Alkaline Phosphatase: 159 U/L — ABNORMAL HIGH (ref 38–126)
Anion gap: 10 (ref 5–15)
BUN: 18 mg/dL (ref 8–23)
CO2: 22 mmol/L (ref 22–32)
Calcium: 8 mg/dL — ABNORMAL LOW (ref 8.9–10.3)
Chloride: 109 mmol/L (ref 98–111)
Creatinine: 1.1 mg/dL (ref 0.61–1.24)
GFR, Est AFR Am: >60 mL/min (ref >60)
GFR, Estimated: >60 mL/min (ref >60)
Glucose, Bld: 106 mg/dL — ABNORMAL HIGH (ref 70–99)
Potassium: 4.2 mmol/L (ref 3.5–5.1)
Sodium: 141 mmol/L (ref 135–145)
Total Bilirubin: <0.2 mg/dL — ABNORMAL LOW (ref 0.3–1.2)
Total Protein: 6.7 g/dL (ref 6.5–8.1)

## 2019-04-20 LAB — CBC WITH DIFFERENTIAL (CANCER CENTER ONLY)
Abs Immature Granulocytes: 0.05 10*3/uL (ref 0.00–0.07)
Basophils Absolute: 0.1 10*3/uL (ref 0.0–0.1)
Basophils Relative: 1 %
Eosinophils Absolute: 0.3 10*3/uL (ref 0.0–0.5)
Eosinophils Relative: 4 %
HCT: 34.2 % — ABNORMAL LOW (ref 39.0–52.0)
Hemoglobin: 11.1 g/dL — ABNORMAL LOW (ref 13.0–17.0)
Immature Granulocytes: 1 %
Lymphocytes Relative: 25 %
Lymphs Abs: 1.6 10*3/uL (ref 0.7–4.0)
MCH: 32 pg (ref 26.0–34.0)
MCHC: 32.5 g/dL (ref 30.0–36.0)
MCV: 98.6 fL (ref 80.0–100.0)
Monocytes Absolute: 0.6 10*3/uL (ref 0.1–1.0)
Monocytes Relative: 9 %
Neutro Abs: 3.7 10*3/uL (ref 1.7–7.7)
Neutrophils Relative %: 60 %
Platelet Count: 215 10*3/uL (ref 150–400)
RBC: 3.47 MIL/uL — ABNORMAL LOW (ref 4.22–5.81)
RDW: 17.2 % — ABNORMAL HIGH (ref 11.5–15.5)
WBC Count: 6.3 10*3/uL (ref 4.0–10.5)
nRBC: 0 % (ref 0.0–0.2)

## 2019-04-20 LAB — TOTAL PROTEIN, URINE DIPSTICK: Protein, ur: NEGATIVE mg/dL

## 2019-04-20 LAB — MAGNESIUM: Magnesium: 1.7 mg/dL (ref 1.7–2.4)

## 2019-04-20 MED ORDER — SODIUM CHLORIDE 0.9 % IV SOLN
5.4000 mg/kg | Freq: Once | INTRAVENOUS | Status: AC
  Administered 2019-04-20: 15:00:00 600 mg via INTRAVENOUS
  Filled 2019-04-20: qty 10

## 2019-04-20 MED ORDER — SODIUM CHLORIDE 0.9% FLUSH
10.0000 mL | INTRAVENOUS | Status: DC | PRN
  Administered 2019-04-20: 16:00:00 10 mL
  Filled 2019-04-20: qty 10

## 2019-04-20 MED ORDER — SODIUM CHLORIDE 0.9% FLUSH
10.0000 mL | INTRAVENOUS | Status: DC | PRN
  Administered 2019-04-20: 10 mL
  Filled 2019-04-20: qty 10

## 2019-04-20 MED ORDER — SODIUM CHLORIDE 0.9 % IV SOLN
Freq: Once | INTRAVENOUS | Status: AC
  Administered 2019-04-20: 14:00:00 via INTRAVENOUS
  Filled 2019-04-20: qty 250

## 2019-04-20 MED ORDER — HEPARIN SOD (PORK) LOCK FLUSH 100 UNIT/ML IV SOLN
500.0000 [IU] | Freq: Once | INTRAVENOUS | Status: AC | PRN
  Administered 2019-04-20: 500 [IU]
  Filled 2019-04-20: qty 5

## 2019-04-20 NOTE — Patient Instructions (Signed)
Cancer Center Discharge Instructions for Patients Receiving Chemotherapy  Today you received the following chemotherapy agents: Vectibix.  To help prevent nausea and vomiting after your treatment, we encourage you to take your nausea medication as directed.   If you develop nausea and vomiting that is not controlled by your nausea medication, call the clinic.   BELOW ARE SYMPTOMS THAT SHOULD BE REPORTED IMMEDIATELY:  *FEVER GREATER THAN 100.5 F  *CHILLS WITH OR WITHOUT FEVER  NAUSEA AND VOMITING THAT IS NOT CONTROLLED WITH YOUR NAUSEA MEDICATION  *UNUSUAL SHORTNESS OF BREATH  *UNUSUAL BRUISING OR BLEEDING  TENDERNESS IN MOUTH AND THROAT WITH OR WITHOUT PRESENCE OF ULCERS  *URINARY PROBLEMS  *BOWEL PROBLEMS  UNUSUAL RASH Items with * indicate a potential emergency and should be followed up as soon as possible.  Feel free to call the clinic should you have any questions or concerns. The clinic phone number is [phone].  Please show the CHEMO ALERT CARD at check-in to the Emergency Department and triage nurse.   

## 2019-04-20 NOTE — Progress Notes (Signed)
[place]   [phone] [phone]   Clinic Follow up Note   Patient Care Team: Billy Proud, MD as PCP - General (Internal Medicine) Billy Merle, MD as Consulting Physician (Hematology) Billy Ada, MD as Consulting Physician (Gastroenterology) 04/20/2019  CHIEF COMPLAINT: Follow-up metastatic rectal cancer  SUMMARY OF ONCOLOGIC HISTORY: Oncology History Overview Note  Cancer Staging Rectal cancer metastasized to liver [facility]) Staging form: Colon and Rectum, AJCC 8th Edition - Clinical stage from 07/28/2018: Stage IVB (cTX, cNX, pM1b) - Signed by Billy Merle, MD on 08/18/2018     Rectal cancer metastasized to liver ([place])  07/28/2018 Initial Biopsy   Biopsy 07/28/18 Final Microscopic Diagnosis A.Colon, Ascending, Polyp:  -Fragments of tubular adenoma -No high-grade dysplasia of malignancy.  B. Rectum, Mass, Polyp:  -Invasive adenocarcinoma, moderately - poorly differentiatred.    07/28/2018 Cancer Staging   Staging form: Colon and Rectum, AJCC 8th Edition - Clinical stage from 07/28/2018: Stage IVB (cTX, cNX, pM1b) - Signed by Billy Merle, MD on 08/18/2018   08/04/2018 Initial Diagnosis   Rectal cancer metastasized to liver ([place])   08/16/2018 Pathology Results   Biospy  Diagnosis 08/16/18 Liver, needle/core biopsy - ADENOCARCINOMA. Microscopic Comment By morphology, the findings are compatible with the provided clinical history of colorectal adenocarcinoma. Results reported to Billy Mendoza on 08/18/2018. Intradepartmental consultation was obtained (Dr. Tresa Mendoza).    08/16/2018 Miscellaneous   Foundation 1 genomic testing: MS-stable Tumor mutation burden  3/Mb K-ras/NRAS wild-type BRAF V600 E KDMSA R266Q PTEN T382f* SMAD4 loss TP53 loss     08/18/2018 Procedure   Colonoscopy by Dr. HBenson Mendoza malignant appearing partially obstructing tumor in the rectum, 8 cm above anal verge.   08/18/2018 - 09/07/2018 Chemotherapy   CAPOX q3weeks with Xeloda '2000mg'$  BID 2 weeks on, 1 weeks off starting 08/18/18.  Swithced treatment due to BRAF mutation.    09/07/2018 - 12/24/2018 Chemotherapy   FOLFOXIRI and avastin every 2 weeks starting 09/07/18. Oxaliplatin held starting with cycle 8 and continue with FOLFIRI and Avastin. D/c due to disease progression.    11/04/2018 Imaging   CT CAP 11/04/18 IMPRESSION: 1. Peripheral filling defect in the right main pulmonary artery extending primarily into the right lower lobe pulmonary artery. While the peripheral location indicates some degree of chronicity of this pulmonary embolus, this is a new finding compared to 07/28/2018. The right ventricular to left ventricular ratio is 0.7, which does not specifically indicate right heart strain. 2. The malignancy appears considerably improved, with reduced size pulmonary nodules; reduced size and increased internal necrosis of the hepatic metastatic lesions; marked improvement in the thoracic adenopathy; reduced conspicuity of the rectal tumor; and significant reduction in perirectal tumor burden/adenopathy. 3. Other imaging findings of potential clinical significance: Aortic Atherosclerosis (ICD10-I70.0). Coronary atherosclerosis. Bilateral renal cysts. Small umbilical hernia contains adipose tissue.  Critical Value/emergent results were called by telephone at the time of interpretation on 11/04/2018 at 3:05 pm to Billy Mendoza, who verbally acknowledged these results.   01/18/2019 Imaging   CT AP W contrast  IMPRESSION: 1. Colonic obstruction resulting from rectal carcinoma. 2. Hepatic metastatic disease which is decreased in size compared with the prior examination of 11/04/2018. 3. Multiple right middle lobe and right lower lobe pulmonary nodules most consistent with metastatic disease.   02/01/2019 -  Chemotherapy   second line BRAFTOVI(encorafenib) '300mg'$  daily and MEKTOVI(binimetinib), '45mg'$  q12h and Vectibix q2weeks starting  02/01/19.  -[facility]    02/13/2019  Imaging   CT Chest  IMPRESSION: Small bilateral pulmonary metastases, mildly progressive from January 2020.  Multifocal hepatic metastases, incompletely visualized, favored to be progressive from April 2020.  Aortic Atherosclerosis (ICD10-I70.0).    02/22/2019 - 03/21/2019 Radiation Therapy   -Palliative local radiation  -Holding mektovi (binimetinib) during RT -Due to significant rectal pain, diarrhea and stool incontinence, he stopped RT after 03/13/19. He was only able to finish 1/2 remianing treatments on 03/21/19.      CURRENT THERAPY: -Second lineBRAFTOVI(encorafenib)340m dailyand MEKTOVI(binimetinib) 459mq12hand Vectibix q2weeks starting4/29/20. Held MEKTOVI 02/19/19-04/06/19 due to RT.  INTERVAL HISTORY: [name] for follow-up treatment as scheduled.  Continues oral Braftovi and Mektovi, s/p cycle 5 Panitumumab on 7/2.  He denies changes in the interval.  Appetite is fair, increased in the morning and lunchtime and tapers off.  He wakes up with good energy and fatigues later in the day.  He has intermittent rectal pain with bowel movement which occurs at least twice per day.  Uses stool softener. When pain is severe, he takes 1 MS contin and 1 oxyIR tab together, he does this every 3-4 days and otherwise takes tylenol. Otherwise denies fever, chills, cough, chest pain, dyspnea, leg edema, skin rash, mucositis, neuropathy or rectal bleeding. He stopped taking xarelto a month ago due to epistaxis when "clearing out my sinuses."    MEDICAL HISTORY:  Past Medical History:  [facility] Date  [facility]    SURGICAL HISTORY: Past Surgical History:  Procedure Laterality Date  . COLONIC STENT PLACEMENT N/A 01/19/2019   Procedure: COLONIC STENT PLACEMENT;  Surgeon: Billy Mendoza;  Location: [facility];  Service: Endoscopy;  Laterality: N/A;  . FLEXIBLE SIGMOIDOSCOPY N/A 01/19/2019   Procedure:  FLEXIBLE SIGMOIDOSCOPY;  Surgeon: Billy Mendoza;  Location: [facility] ENDOSCOPY;  Service: Endoscopy;  Laterality: N/A;  . IR IMAGING GUIDED PORT INSERTION  08/16/2018  . IR [place] BX ASP/DRAIN  08/16/2018    I have reviewed the social history and family history with the patient and they are unchanged from previous note.  ALLERGIES:  has No Known Allergies.  MEDICATIONS:  Current Outpatient Medications  Medication Sig Dispense Refill  . acetaminophen (TYLENOL) 325 MG tablet Take 650 mg by mouth every 6 (six) hours as needed for moderate pain.    . Binimetinib (MEKTOVI) 15 MG TABS Take 45 mg by mouth 2 (two) times a day. 180 tablet 3  . diphenoxylate-atropine (LOMOTIL) 2.5-0.025 MG tablet Take 1-2 tablets by mouth 4 (four) times daily as needed for diarrhea or loose stools. Up to 8 tabs daily for severe diarrhea 60 tablet 2  . doxycycline (VIBRA-TABS) 100 MG tablet TAKE 1 TABLET(100 MG) BY MOUTH TWICE DAILY 60 tablet 0  . Encorafenib (BRAFTOVI) 75 MG CAPS Take 300 mg by mouth daily. 120 capsule 3  . magnesium oxide (MAG-OX) 400 (241.3 Mg) MG tablet Take 1 tablet (400 mg total) by mouth daily. 30 tablet 1  . morphine (MS CONTIN) 15 MG 12 hr tablet Take 1 tablet (15 mg total) by mouth every 12 (twelve) hours. 60 tablet 0  . oxyCODONE (OXY IR/ROXICODONE) 5 MG immediate release tablet Take 1-2 tablets (5-10 mg total) by mouth every 4 (four) hours as needed for severe pain. 90 tablet 0  . potassium chloride SA (K-DUR) 20 MEQ tablet Take 1 tablet (20 mEq total) by mouth 2 (two) times daily. 60 tablet 3  . VITAMIN E PO Take 1 tablet by mouth daily.    .Marland Kitchen  XARELTO 15 MG TABS tablet TAKE 1 TABLET(15 MG) BY MOUTH DAILY WITH SUPPER 30 tablet 1  . clindamycin (CLINDAGEL) 1 % gel Apply topically 2 (two) times daily. 60 g 0  . traMADol (ULTRAM) 50 MG tablet Take 1 tablet (50 mg total) by mouth every 6 (six) hours as needed. 30 tablet 0   No current facility-administered medications for this visit.     Facility-Administered Medications Ordered in Other Visits  Medication Dose Route Frequency Provider Last Rate Last Dose  . heparin lock flush 100 unit/mL  500 Units Intracatheter Once PRN Billy Merle, MD      . panitumumab (VECTIBIX) 600 mg in sodium chloride 0.9 % 100 mL chemo infusion  5.4 mg/kg (Treatment Plan Recorded) Intravenous Once Billy Merle, MD      . sodium chloride flush (NS) 0.9 % injection 10 mL  10 mL Intracatheter PRN Billy Merle, MD        PHYSICAL EXAMINATION: ECOG PERFORMANCE STATUS: 1 - Symptomatic but completely ambulatory  Vitals:   04/20/19 1323  BP: 118/89  Pulse: 76  Resp: 18  Temp: 99.1 F (37.3 C)  SpO2: 100%   Filed Weights   04/20/19 1323  Weight: 235 lb 11.2 oz (106.9 kg)    GENERAL:alert, no distress and comfortable SKIN: No obvious rash EYES:  sclera clear LUNGS: Lungs clear with normal respiratory effort HEART:  no lower extremity edema ABDOMEN:abdomen soft, non-tender and normal bowel sounds Musculoskeletal:no cyanosis of digits  NEURO: alert & oriented x 3 with fluent speech, normal gait PAC without erythema  LABORATORY DATA:  I have reviewed the data as listed CBC Latest Ref Rng & Units 04/20/2019 04/06/2019 03/23/2019  WBC 4.0 - 10.5 K/uL 6.3 5.2 6.1  Hemoglobin 13.0 - 17.0 g/dL 11.1(L) 10.6(L) 10.2(L)  Hematocrit 39.0 - 52.0 % 34.2(L) 33.2(L) 33.6(L)  Platelets 150 - 400 K/uL 215 222 161     CMP Latest Ref Rng & Units 04/20/2019 04/06/2019 03/23/2019  Glucose 70 - 99 mg/dL 106(Billy) 121(Billy) 107(Billy)  BUN 8 - 23 mg/dL _0 Creatinine 0.61 - 1.24 mg/dL 1.10 0.98 0.98  Sodium 135 - 145 mmol/L 141 138 142  Potassium 3.5 - 5.1 mmol/L 4.2 4.0 4.0  Chloride 98 - 111 mmol/L 109 110 110  CO2 22 - 32 mmol/L 22 19(L) 23  Calcium 8.9 - 10.3 mg/dL 8.0(L) 8.1(L) 8.7(L)  Total Protein 6.5 - 8.1 g/dL 6.7 6.5 6.7  Total Bilirubin 0.3 - 1.2 mg/dL <0.2(L) <0.2(L) <0.2(L)  Alkaline Phos 38 - 126 U/L 159(Billy) 135(Billy) 116  AST 15 - 41 U/L _1 ALT 0 - 44  U/L _2 RADIOGRAPHIC STUDIES: I have personally reviewed the radiological images as listed and agreed with the findings in the report. No results found.   ASSESSMENT & PLAN: Billy Mendoza a 68 y.o.malewith   1. Proximal rectal adenocarcinoma, metastatic to liver and likely tolungsandnodes, MSS,KRAS/NRAS wild type,BRAF V600E mutation (+) 2. Acne-type skin rash - secondary to panitumumab  3.Bowel obstruction from rectal tumor, s/p stent placed by Dr. Benson Norway 01/19/19 4. Rectal bleeding 5. Iron deficiency anemia 6.History of right PE - on xarelto 20 mg, reduced to 15 mg daily due to rectal bleeding. Patient self discontinued 1 month ago d/t epistaxis  7. Pancytopeniasecondarytochemotherapy 8. HTN 6. Goal of care discussion -treatment goal is palliative;full code now  Dispo: Billy Mendoza appears well. He completed cycle 5 panitumumab and continues mektovi and braftovi. He  is tolerating well with mild late-day fatigue. His rectal pain is improved overall and stable lately. We reviewed pain regimen and dosing instructions for opioids. I recommend he restart xarelto due to Billy/o PE which he d/c'd due to epistaxis. I discussed some alternatives, but he ultimately agrees to restart Xarelto at 15 mg daily. He has difficulty swallowing potassium tab, K 4.2, has been normal lately. He can reduce to 1 tab daily and increase K in his diet. He agrees. Labs otherwise adequate to continue treatment. He will proceed with cycle 6 panitumumab today and continue mektovi and braftovi. CEA is stable. Plan to restage next month. I reviewed the plan with Dr. Burr Medico and patient agrees. He will return for f/u and panitumumab in 2 weeks.  Orders Placed This Encounter  Procedures  . CT Abdomen Pelvis W Contrast    Standing Status:   Future    Standing Expiration Date:   04/19/2020    Order Specific Question:   If indicated for the ordered procedure, I authorize the administration of contrast media  per Radiology protocol    Answer:   Yes    Order Specific Question:   Preferred imaging location?    Answer:   [facility]    Order Specific Question:   Is Oral Contrast requested for this exam?    Answer:   Yes, Per Radiology protocol    Order Specific Question:   Radiology Contrast Protocol - do NOT remove file path    Answer:   \\[email or web address]  . CT Chest W Contrast    Standing Status:   Future    Standing Expiration Date:   04/19/2020    Order Specific Question:   If indicated for the ordered procedure, I authorize the administration of contrast media per Radiology protocol    Answer:   Yes    Order Specific Question:   Preferred imaging location?    Answer:   [facility]    Order Specific Question:   Radiology Contrast Protocol - do NOT remove file path    Answer:   \\[email or web address]   All questions were answered. The patient knows to call the clinic with any problems, questions or concerns. No barriers to learning was detected.    Billy Feeling, NP 04/20/19

## 2019-04-21 ENCOUNTER — Telehealth: Payer: Self-pay | Admitting: Nurse Practitioner

## 2019-04-21 NOTE — Telephone Encounter (Signed)
No los per 7/16. °

## 2019-04-26 ENCOUNTER — Telehealth: Payer: Self-pay | Admitting: Radiation Oncology

## 2019-04-26 NOTE — Telephone Encounter (Signed)
  Radiation Oncology         [phone]) [phone] ________________________________  Name: Billy Mendoza MRN: [id number]  Date of Service: 04/26/2019  DOB: 1951-03-19  Post Treatment Telephone Note  Diagnosis:   Stage IV poorly differentiated adenocarcinoma of the rectum.  Interval Since Last Radiation: 5 weeks    02/22/2019-03/21/2019:  The rectum was treated to 37.5 Gy in 15 fractions.   Narrative:  The patient was contacted today for routine follow-up. During treatment he did very well with radiotherapy but did have diarrhea and rectal frequency. He did not have significant desquamation.   Impression/Plan: 1. 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 I left a message for the patient's wife as I was unable to reach the patient by phone and VM was not set up. He will also continue with Dr. Burr Medico as previously outlined in her office notes.    Carola Rhine, PAC

## 2019-05-01 NOTE — Progress Notes (Signed)
[facility]   Telephone:([phone] Fax:([phone]) [phone]   Clinic Follow up Note   Patient Care Team: Merry Proud, MD as PCP - General (Internal Medicine) Truitt Merle, MD as Consulting Physician (Hematology) Carol Ada, MD as Consulting Physician (Gastroenterology)  Date of Service:  05/04/2019  CHIEF COMPLAINT: F/u of rectal cancer metastasized to liver  SUMMARY OF ONCOLOGIC HISTORY: Oncology History Overview Note  Cancer Staging Rectal cancer metastasized to liver [facility]) Staging form: Colon and Rectum, AJCC 8th Edition - Clinical stage from 07/28/2018: Stage IVB (cTX, cNX, pM1b) - Signed by Truitt Merle, MD on 08/18/2018     Rectal cancer metastasized to liver ([place])  07/28/2018 Initial Biopsy   Biopsy 07/28/18 Final Microscopic Diagnosis A.Colon, Ascending, Polyp:  -Fragments of tubular adenoma -No high-grade dysplasia of malignancy.  B. Rectum, Mass, Polyp:  -Invasive adenocarcinoma, moderately - poorly differentiatred.    07/28/2018 Cancer Staging   Staging form: Colon and Rectum, AJCC 8th Edition - Clinical stage from 07/28/2018: Stage IVB (cTX, cNX, pM1b) - Signed by Truitt Merle, MD on 08/18/2018   08/04/2018 Initial Diagnosis   Rectal cancer metastasized to liver ([place])   08/16/2018 Pathology Results   Biospy  Diagnosis 08/16/18 Liver, needle/core biopsy - ADENOCARCINOMA. Microscopic Comment By morphology, the findings are compatible with the provided clinical history of colorectal adenocarcinoma. Results reported to [name] on 08/18/2018. Intradepartmental consultation was obtained ([name]).    08/16/2018 Miscellaneous   Foundation 1 genomic testing: MS-stable Tumor mutation burden  3/Mb K-ras/NRAS wild-type BRAF V600 E KDMSA R266Q PTEN T348f* SMAD4 loss TP53 loss     08/18/2018 Procedure   Colonoscopy by Dr. HBenson Norwayshowed malignant appearing partially obstructing tumor in the rectum, 8 cm above anal verge.   08/18/2018  - 09/07/2018 Chemotherapy   CAPOX q3weeks with Xeloda 20047mBID 2 weeks on, 1 weeks off starting 08/18/18.  Swithced treatment due to BRAF mutation.    09/07/2018 - 12/24/2018 Chemotherapy   FOLFOXIRI and avastin every 2 weeks starting 09/07/18. Oxaliplatin held starting with cycle 8 and continue with FOLFIRI and Avastin. D/c due to disease progression.    11/04/2018 Imaging   CT CAP 11/04/18 IMPRESSION: 1. Peripheral filling defect in the right main pulmonary artery extending primarily into the right lower lobe pulmonary artery. While the peripheral location indicates some degree of chronicity of this pulmonary embolus, this is a new finding compared to 07/28/2018. The right ventricular to left ventricular ratio is 0.7, which does not specifically indicate right heart strain. 2. The malignancy appears considerably improved, with reduced size pulmonary nodules; reduced size and increased internal necrosis of the hepatic metastatic lesions; marked improvement in the thoracic adenopathy; reduced conspicuity of the rectal tumor; and significant reduction in perirectal tumor burden/adenopathy. 3. Other imaging findings of potential clinical significance: Aortic Atherosclerosis (ICD10-I70.0). Coronary atherosclerosis. Bilateral renal cysts. Small umbilical hernia contains adipose tissue.  Critical Value/emergent results were called by telephone at the time of interpretation on 11/04/2018 at 3:05 pm to Dr. YATruitt Merle who verbally acknowledged these results.   01/18/2019 Imaging   CT AP W contrast  IMPRESSION: 1. Colonic obstruction resulting from rectal carcinoma. 2. Hepatic metastatic disease which is decreased in size compared with the prior examination of 11/04/2018. 3. Multiple right middle lobe and right lower lobe pulmonary nodules most consistent with metastatic disease.   02/01/2019 -  Chemotherapy   second line BRAFTOVI(encorafenib) 30027maily and MEKTOVI(binimetinib), 72m38m2h  and Vectibix q2weeks starting 02/01/19.  -He has held  Mektovi 02/19/19-04/06/19 due to RT    02/13/2019 Imaging   CT Chest  IMPRESSION: Small bilateral pulmonary metastases, mildly progressive from January 2020.  Multifocal hepatic metastases, incompletely visualized, favored to be progressive from April 2020.  Aortic Atherosclerosis (ICD10-I70.0).    02/22/2019 - 03/21/2019 Radiation Therapy   -Palliative local radiation  -Holding mektovi (binimetinib) during RT -Due to significant rectal pain, diarrhea and stool incontinence, he stopped RT after 03/13/19. He was only able to finish 1/2 remianing treatments on 03/21/19.       CURRENT THERAPY:  -Second lineBRAFTOVI(encorafenib)362m dailyand MEKTOVI(binimetinib) 483mq12hand Vectibix q2weeks starting4/29/20. Held MEKTOVI 02/19/19-04/06/19 due to RT.  INTERVAL HISTORY:  Billy Mendoza here for a follow up and treatment. He presents to the clinic alone. He notes he is doing well and better. He notes is rectal pain has improved. He has not needed narcotics in a few days. He denies constipation but notes occasional rectal bleeding. He is on 1561marelto still. No significant bleeding for him to stop currently. Overall manageable.  He has left knee skin ring rash. No rash like this is present anywhere else. His skin rash on his face and scalp has improved on Doxycycline. He no longer takes topical treatments. He is tolerating MEKTOVI and BRAFTOVI well. He feels fatigued in the middle of the day.    REVIEW OF SYSTEMS:   Constitutional: Denies fevers, chills or abnormal weight loss (+) fatigued  Eyes: Denies blurriness of vision Ears, nose, mouth, throat, and face: Denies mucositis or sore throat Respiratory: Denies cough, dyspnea or wheezes Cardiovascular: Denies palpitation, chest discomfort or lower extremity swelling Gastrointestinal:  Denies nausea, heartburn or change in bowel habits (+) Mild rectal bleeding Skin: Denies abnormal  skin rashes (+) Skin rash ring on left knee Lymphatics: Denies new lymphadenopathy or easy bruising Neurological:Denies numbness, tingling or new weaknesses Behavioral/Psych: Mood is stable, no new changes  All other systems were reviewed with the patient and are negative.  MEDICAL HISTORY:  Past Medical History:  Diagnosis [facility]   [facility]   SURGICAL HISTORY: Past Surgical History:  Procedure Laterality Date   COLONIC STENT PLACEMENT N/A 01/19/2019   Procedure: COLONIC STENT PLACEMENT;  Surgeon: HunCarol AdaD;  Location: [facility];  Service: Endoscopy;  Laterality: N/A;   FLEXIBLE SIGMOIDOSCOPY N/A 01/19/2019   Procedure: FLEXIBLE SIGMOIDOSCOPY;  Surgeon: HunCarol AdaD;  Location: [facility] ENDOS[facility];  Service: Endoscopy;  Laterality: N/A;   IR IMAGING GUIDED PORT INSERTION  08/16/2018   IR US [place] BX ASP/DRAIN  08/16/2018    I have reviewed the social history and family history with the patient and they are unchanged from previous note.  ALLERGIES:  has No Known Allergies.  MEDICATIONS:  Current Outpatient Medications  Medication Sig Dispense Refill   acetaminophen (TYLENOL) 325 MG tablet Take 650 mg by mouth every 6 (six) hours as needed for moderate pain.     Binimetinib (MEKTOVI) 15 MG TABS Take 45 mg by mouth 2 (two) times a day. 180 tablet 3   clindamycin (CLINDAGEL) 1 % gel Apply topically 2 (two) times daily. 60 g 0   diphenoxylate-atropine (LOMOTIL) 2.5-0.025 MG tablet Take 1-2 tablets by mouth 4 (four) times daily as needed for diarrhea or loose stools. Up to 8 tabs daily for severe diarrhea 60 tablet 2   doxycycline (VIBRA-TABS) 100 MG tablet TAKE 1 TABLET(100 MG) BY MOUTH TWICE DAILY 60 tablet 0   Encorafenib (BRAFTOVI) 75 MG CAPS Take 300 mg by mouth daily.  120 capsule 3   magnesium oxide (MAG-OX) 400 (241.3 Mg) MG tablet Take 1 tablet (400 mg total) by mouth daily. 30 tablet 1   morphine (MS CONTIN) 15 MG 12 hr tablet Take 1 tablet (15 mg  total) by mouth every 12 (twelve) hours. 60 tablet 0   oxyCODONE (OXY IR/ROXICODONE) 5 MG immediate release tablet Take 1-2 tablets (5-10 mg total) by mouth every 4 (four) hours as needed for severe pain. 90 tablet 0   potassium chloride SA (K-DUR) 20 MEQ tablet Take 1 tablet (20 mEq total) by mouth 2 (two) times daily. 60 tablet 3   traMADol (ULTRAM) 50 MG tablet Take 1 tablet (50 mg total) by mouth every 6 (six) hours as needed. 30 tablet 0   VITAMIN E PO Take 1 tablet by mouth daily.     XARELTO 15 MG TABS tablet TAKE 1 TABLET(15 MG) BY MOUTH DAILY WITH SUPPER 30 tablet 1   No current facility-administered medications for this visit.     PHYSICAL EXAMINATION: ECOG PERFORMANCE STATUS: 1 - Symptomatic but completely ambulatory  Vitals:   05/04/19 1124  BP: (!) 141/94  Pulse: 79  Resp: 17  Temp: 98.5 F (36.9 C)  SpO2: 100%   Filed Weights   05/04/19 1124  Weight: 236 lb (107 kg)    GENERAL:alert, no distress and comfortable SKIN: skin color, texture, turgor are normal (+) acnes skin rash of face and scalp improved (+) Skin rash ring on left knee EYES: normal, Conjunctiva are pink and non-injected, sclera clear  NECK: supple, thyroid normal size, non-tender, without nodularity LYMPH:  no palpable lymphadenopathy in the cervical, axillary  LUNGS: clear to auscultation and percussion with normal breathing effort HEART: regular rate & rhythm and no murmurs and no lower extremity edema ABDOMEN:abdomen soft, non-tender and normal bowel sounds Musculoskeletal:no cyanosis of digits and no clubbing  NEURO: alert & oriented x 3 with fluent speech, no focal motor/sensory deficits  LABORATORY DATA:  I have reviewed the data as listed CBC Latest Ref Rng & Units 05/04/2019 04/20/2019 04/06/2019  WBC 4.0 - 10.5 K/uL 7.1 6.3 5.2  Hemoglobin 13.0 - 17.0 g/dL 11.0(L) 11.1(L) 10.6(L)  Hematocrit 39.0 - 52.0 % 34.7(L) 34.2(L) 33.2(L)  Platelets 150 - 400 K/uL 292 215 222     CMP Latest  Ref Rng & Units 04/20/2019 04/06/2019 03/23/2019  Glucose 70 - 99 mg/dL 106(H) 121(H) 107(H)  BUN 8 - 23 mg/dL 18 22 14   Creatinine 0.61 - 1.24 mg/dL 1.10 0.98 0.98  Sodium 135 - 145 mmol/L 141 138 142  Potassium 3.5 - 5.1 mmol/L 4.2 4.0 4.0  Chloride 98 - 111 mmol/L 109 110 110  CO2 22 - 32 mmol/L 22 19(L) 23  Calcium 8.9 - 10.3 mg/dL 8.0(L) 8.1(L) 8.7(L)  Total Protein 6.5 - 8.1 g/dL 6.7 6.5 6.7  Total Bilirubin 0.3 - 1.2 mg/dL <0.2(L) <0.2(L) <0.2(L)  Alkaline Phos 38 - 126 U/L 159(H) 135(H) 116  AST 15 - 41 U/L 23 21 24   ALT 0 - 44 U/L 18 17 19       RADIOGRAPHIC STUDIES: I have personally reviewed the radiological images as listed and agreed with the findings in the report. No results found.   ASSESSMENT & PLAN:  Billy Mendoza is a 67 y.o. male with   1. Proximal rectal adenocarcinoma, metastatic to liver and likely tolungsandnodes, MSS,KRAS/NRAS wild type,BRAF V600E mutation (+) -He was diagnosed in10/2019.He completed 1 cycle of CAPOX before changing todose reducedFOLFOXIRI andAvastindue to BRAF mutation  in his tumor. -Hismetastatic diseaseis not curablewith surgery, he will continue treatment for as long as he can tolerate to control disease. -He was hospitalized on 01/18/19 forbowel obstruction due to rectal cancer. He had stent placed by Dr. Benson Norway. His CT AP showed his known rectaltumor andobstruction, liver mets have decreased in size and multiple new right lung nodules consistent with metastasis. -I have changed his chemo to second-lineoralBRAF/MEK/EGFR inhibitors(Braftovi, Mektovi andVectibix q2weeks)which he started on 02/01/19. -He started Radiation 02/22/19. Due to significant rectal pain, diarrhea and stool incontinence, he held RT for 3-4 days and completed on 03/21/19. Braftovi and Vectibix was held briefly also and restarted.  Irean Hong was held 02/19/19-04/06/19 due to RT. He has recovered well, rectal pain much improved and uses less pain meds.  -He  continues to clinially improve now with great performance status. Labs reviewed, CBC and CMP WNL except hg 11, cal 8.0. Mag 1.7. Urine protein negative. Ferritin, CEA and CK still pending. Overall adequate to proceed with Vectibix today and continue every 2 weeks  -Continue Braftovi and Mektovi  -Next CT scan 8/17 -F/u in 2 weeks   2.Recent bowel obstruction from rectal tumor,severerectal pain -s/p stent placement by Dr. Benson Norway on 01/19/2019,he declined loop colostomybut agree to keep surgery as last resort.  -He understands thehigh possibility of recurrent bowel obstruction due to tumor progression. -He has met with dietician. He will continue with low residual diet. -For constipationhe will continue Colase every 1-2 days to prevent constipation/SBO. He denies diarrhea. He still has imodium and laxatives for any severe changes.  -Tramadol was not controlling rectal pain. He was temporarily on long acting MS Contin 77m BID.  -His 03/2019 f/u with Dr HBenson Norwayshowed his fissure was the main cause of his rectal pain.  -After short course palliative radiation 5-03/2019, his main pain is from stent, now mostly at 1-3/10. He has only needed Tylenol lately. He still has oxycodone 567mq6hours as needed for his significant pain.  -rectal pain mostly mild and manageable, without constipation lately.   3. Rectal bleeding -likely related to tumor bleeding and anticoagulation (Xarelto 20 mg daily)- bleeding improved with lower dose, he is on 1527maily now -Has occurred intermittently since diagnosis, improves for short period after treatments -He will hold Xarelto for moderate bleeding with clots. For mild bleeding he can continue Xarelto 74m43mily. Rectal bleeding very mild recently.   4. Iron deficiency anemia -secondary to blood loss from tumor  -ser iron 29, TIBC 369, 8% saturation ratio, consistent with iron deficiency  -He can start prenatal vitamin with iron once daily -He  receivedinitial 2 doses ofIV Feraheme on 5/20/20and 03/02/19.  -Hg at11today (05/04/19), ferritin normal today   5.History of right PE -His PE is likely related to his underlying metastatic colon cancer and chemotherapy, especially Avastin. -He is on Xarelto 20 mg daily and will continue indefinitely. Has occasionally epistaxis secondary to Avastin,thrombocytopenia and Xarelto.  6. HTN  -TakesAmlodipine -BPoverall controlled   7. Acne-type skin rash -secondary toPanitumumab,developed after cycle 1 -Has clindamycin gel BID and hydrocortisone and doxycycline 100 mg BID -Given his rash is very mild on face and scalp with doxycycline alone he has not been using topical treatment.   8. Goal of care discussion -He is full code now -He understands his disease is incurable,and the goal of therapy is palliative to prolong his life.   Plan -He is clinically doing well  -Labs reviewed and adequate to proceed with Vectibix today -continue Braftoviand restart Mektovi  -  Lab, flush, and Vectibix in 2 and 4 weeks  -f/u with NP Lacie in 2 weeks, f/u with me in 4 weeks -CT CAP W Contrast on 8/17   No problem-specific Assessment & Plan notes found for this encounter.   No orders of the defined types were placed in this encounter.  All questions were answered. The patient knows to call the clinic with any problems, questions or concerns. No barriers to learning was detected. I spent 20 minutes counseling the patient face to face. The total time spent in the appointment was 25 minutes and more than 50% was on counseling and review of test results     Truitt Merle, MD 05/04/2019   I, Joslyn Devon, am acting as scribe for Truitt Merle, MD.   I have reviewed the above documentation for accuracy and completeness, and I agree with the above.

## 2019-05-04 ENCOUNTER — Telehealth: Payer: Self-pay | Admitting: Hematology

## 2019-05-04 ENCOUNTER — Inpatient Hospital Stay: Payer: BC Managed Care – PPO

## 2019-05-04 ENCOUNTER — Inpatient Hospital Stay (HOSPITAL_BASED_OUTPATIENT_CLINIC_OR_DEPARTMENT_OTHER): Payer: BC Managed Care – PPO | Admitting: Hematology

## 2019-05-04 ENCOUNTER — Other Ambulatory Visit: Payer: Self-pay

## 2019-05-04 VITALS — BP 141/94 | HR 79 | Temp 98.5°F | Resp 17 | Ht 73.0 in | Wt 236.0 lb

## 2019-05-04 DIAGNOSIS — C787 Secondary malignant neoplasm of liver and intrahepatic bile duct: Secondary | ICD-10-CM

## 2019-05-04 DIAGNOSIS — Z95828 Presence of other vascular implants and grafts: Secondary | ICD-10-CM

## 2019-05-04 DIAGNOSIS — C7801 Secondary malignant neoplasm of right lung: Secondary | ICD-10-CM | POA: Diagnosis not present

## 2019-05-04 DIAGNOSIS — R21 Rash and other nonspecific skin eruption: Secondary | ICD-10-CM

## 2019-05-04 DIAGNOSIS — C20 Malignant neoplasm of rectum: Secondary | ICD-10-CM

## 2019-05-04 DIAGNOSIS — Z5112 Encounter for antineoplastic immunotherapy: Secondary | ICD-10-CM | POA: Diagnosis not present

## 2019-05-04 DIAGNOSIS — Z7901 Long term (current) use of anticoagulants: Secondary | ICD-10-CM

## 2019-05-04 DIAGNOSIS — D61810 Antineoplastic chemotherapy induced pancytopenia: Secondary | ICD-10-CM

## 2019-05-04 DIAGNOSIS — C7802 Secondary malignant neoplasm of left lung: Secondary | ICD-10-CM | POA: Diagnosis not present

## 2019-05-04 DIAGNOSIS — I2699 Other pulmonary embolism without acute cor pulmonale: Secondary | ICD-10-CM

## 2019-05-04 DIAGNOSIS — D509 Iron deficiency anemia, unspecified: Secondary | ICD-10-CM

## 2019-05-04 DIAGNOSIS — I10 Essential (primary) hypertension: Secondary | ICD-10-CM

## 2019-05-04 DIAGNOSIS — L709 Acne, unspecified: Secondary | ICD-10-CM

## 2019-05-04 DIAGNOSIS — D500 Iron deficiency anemia secondary to blood loss (chronic): Secondary | ICD-10-CM

## 2019-05-04 DIAGNOSIS — Z86711 Personal history of pulmonary embolism: Secondary | ICD-10-CM

## 2019-05-04 LAB — CBC WITH DIFFERENTIAL (CANCER CENTER ONLY)
Abs Immature Granulocytes: 0.07 10*3/uL (ref 0.00–0.07)
Basophils Absolute: 0.1 10*3/uL (ref 0.0–0.1)
Basophils Relative: 1 %
Eosinophils Absolute: 0.2 10*3/uL (ref 0.0–0.5)
Eosinophils Relative: 3 %
HCT: 34.7 % — ABNORMAL LOW (ref 39.0–52.0)
Hemoglobin: 11 g/dL — ABNORMAL LOW (ref 13.0–17.0)
Immature Granulocytes: 1 %
Lymphocytes Relative: 33 %
Lymphs Abs: 2.3 10*3/uL (ref 0.7–4.0)
MCH: 31.3 pg (ref 26.0–34.0)
MCHC: 31.7 g/dL (ref 30.0–36.0)
MCV: 98.6 fL (ref 80.0–100.0)
Monocytes Absolute: 0.6 10*3/uL (ref 0.1–1.0)
Monocytes Relative: 9 %
Neutro Abs: 3.8 10*3/uL (ref 1.7–7.7)
Neutrophils Relative %: 53 %
Platelet Count: 292 10*3/uL (ref 150–400)
RBC: 3.52 MIL/uL — ABNORMAL LOW (ref 4.22–5.81)
RDW: 17.2 % — ABNORMAL HIGH (ref 11.5–15.5)
WBC Count: 7.1 10*3/uL (ref 4.0–10.5)
nRBC: 0 % (ref 0.0–0.2)

## 2019-05-04 LAB — CMP (CANCER CENTER ONLY)
ALT: 22 U/L (ref 0–44)
AST: 26 U/L (ref 15–41)
Albumin: 2.5 g/dL — ABNORMAL LOW (ref 3.5–5.0)
Alkaline Phosphatase: 192 U/L — ABNORMAL HIGH (ref 38–126)
Anion gap: 8 (ref 5–15)
BUN: 19 mg/dL (ref 8–23)
CO2: 22 mmol/L (ref 22–32)
Calcium: 8.3 mg/dL — ABNORMAL LOW (ref 8.9–10.3)
Chloride: 110 mmol/L (ref 98–111)
Creatinine: 0.79 mg/dL (ref 0.61–1.24)
GFR, Est AFR Am: >60 mL/min (ref >60)
GFR, Estimated: >60 mL/min (ref >60)
Glucose, Bld: 90 mg/dL (ref 70–99)
Potassium: 3.9 mmol/L (ref 3.5–5.1)
Sodium: 140 mmol/L (ref 135–145)
Total Bilirubin: <0.2 mg/dL — ABNORMAL LOW (ref 0.3–1.2)
Total Protein: 6.3 g/dL — ABNORMAL LOW (ref 6.5–8.1)

## 2019-05-04 LAB — FERRITIN: Ferritin: 30 ng/mL (ref 24–336)

## 2019-05-04 LAB — MAGNESIUM: Magnesium: 1.7 mg/dL (ref 1.7–2.4)

## 2019-05-04 LAB — TOTAL PROTEIN, URINE DIPSTICK: Protein, ur: NEGATIVE mg/dL

## 2019-05-04 LAB — CEA (IN HOUSE-CHCC): CEA (CHCC-In House): 54.04 ng/mL — ABNORMAL HIGH (ref 0.00–5.00)

## 2019-05-04 LAB — CK: Total CK: 54 U/L (ref 49–397)

## 2019-05-04 MED ORDER — SODIUM CHLORIDE 0.9% FLUSH
10.0000 mL | INTRAVENOUS | Status: DC | PRN
  Administered 2019-05-04: 10 mL
  Filled 2019-05-04: qty 10

## 2019-05-04 MED ORDER — SODIUM CHLORIDE 0.9 % IV SOLN
Freq: Once | INTRAVENOUS | Status: AC
  Administered 2019-05-04: 14:00:00 via INTRAVENOUS
  Filled 2019-05-04: qty 250

## 2019-05-04 MED ORDER — SODIUM CHLORIDE 0.9% FLUSH
10.0000 mL | INTRAVENOUS | Status: DC | PRN
  Administered 2019-05-04: 14:00:00 10 mL
  Filled 2019-05-04: qty 10

## 2019-05-04 MED ORDER — SODIUM CHLORIDE 0.9 % IV SOLN
5.4000 mg/kg | Freq: Once | INTRAVENOUS | Status: DC
  Filled 2019-05-04: qty 30

## 2019-05-04 MED ORDER — HEPARIN SOD (PORK) LOCK FLUSH 100 UNIT/ML IV SOLN
500.0000 [IU] | Freq: Once | INTRAVENOUS | Status: AC | PRN
  Administered 2019-05-04: 500 [IU]
  Filled 2019-05-04: qty 5

## 2019-05-04 NOTE — Patient Instructions (Signed)
Coronavirus (COVID-19) Are you at risk?  Are you at risk for the Coronavirus (COVID-19)?  To be considered HIGH RISK for Coronavirus (COVID-19), you have to meet the following criteria:  . Traveled to [place], [place], [place], [place] or [place]; or in the [place] to [place], [place], [place], or [place]; and have fever, cough, and shortness of breath within the last 2 weeks of travel OR . Been in close contact with a person diagnosed with COVID-19 within the last 2 weeks and have fever, cough, and shortness of breath . IF YOU DO NOT MEET THESE CRITERIA, YOU ARE CONSIDERED LOW RISK FOR COVID-19.  What to do if you are HIGH RISK for COVID-19?  . If you are having a medical emergency, call 911. . Seek medical care right away. Before you go to a doctor's office, urgent care or emergency department, call ahead and tell them about your recent travel, contact with someone diagnosed with COVID-19, and your symptoms. You should receive instructions from your physician's office regarding next steps of care.  . When you arrive at healthcare provider, tell the healthcare staff immediately you have returned from visiting [place], [place], [place], [place] or [place]; or traveled in the [place] to [place], [place], [place], or [place]; in the last two weeks or you have been in close contact with a person diagnosed with COVID-19 in the last 2 weeks.   . Tell the health care staff about your symptoms: fever, cough and shortness of breath. . After you have been seen by a medical provider, you will be either: o Tested for (COVID-19) and discharged home on quarantine except to seek medical care if symptoms worsen, and asked to  - Stay home and avoid contact with others until you get your results (4-5 days)  - Avoid travel on public transportation if possible (such as bus, train, or airplane) or o Sent to the Emergency Department by EMS for evaluation, COVID-19 testing, and possible  admission depending on your condition and test results.  What to do if you are LOW RISK for COVID-19?  Reduce your risk of any infection by using the same precautions used for avoiding the common cold or flu:  . Wash your hands often with soap and warm water for at least 20 seconds.  If soap and water are not readily available, use an alcohol-based hand sanitizer with at least 60% alcohol.  . If coughing or sneezing, cover your mouth and nose by coughing or sneezing into the elbow areas of your shirt or coat, into a tissue or into your sleeve (not your hands). . Avoid shaking hands with others and consider head nods or verbal greetings only. . Avoid touching your eyes, nose, or mouth with unwashed hands.  . Avoid close contact with people who are sick. . Avoid places or events with large numbers of people in one location, like concerts or sporting events. . Carefully consider travel plans you have or are making. . If you are planning any travel outside or inside the [place], visit the CDC's Travelers' Health webpage for the latest health notices. . If you have some symptoms but not all symptoms, continue to monitor at home and seek medical attention if your symptoms worsen. . If you are having a medical emergency, call 911.   ADDITIONAL HEALTHCARE OPTIONS FOR PATIENTS  [facility] / e-Visit: [email or web address]         [facility]: [phone]  [facility]   [facility]: [phone]                   [facility]: [phone]   

## 2019-05-04 NOTE — Telephone Encounter (Signed)
Scheduled appt per 7/30 los.  Spoke with patient and he is aware of his appt date and time.  Patient stated he will pick up some contrast tomorrow (7/31) when he come to his appt.

## 2019-05-05 ENCOUNTER — Inpatient Hospital Stay: Payer: BC Managed Care – PPO

## 2019-05-05 ENCOUNTER — Other Ambulatory Visit: Payer: Self-pay

## 2019-05-05 ENCOUNTER — Telehealth: Payer: Self-pay | Admitting: Nurse Practitioner

## 2019-05-05 VITALS — BP 121/84 | HR 89 | Temp 98.7°F | Resp 16

## 2019-05-05 DIAGNOSIS — C7802 Secondary malignant neoplasm of left lung: Secondary | ICD-10-CM | POA: Diagnosis not present

## 2019-05-05 DIAGNOSIS — D509 Iron deficiency anemia, unspecified: Secondary | ICD-10-CM | POA: Diagnosis not present

## 2019-05-05 DIAGNOSIS — C7801 Secondary malignant neoplasm of right lung: Secondary | ICD-10-CM | POA: Diagnosis not present

## 2019-05-05 DIAGNOSIS — C20 Malignant neoplasm of rectum: Secondary | ICD-10-CM | POA: Diagnosis not present

## 2019-05-05 DIAGNOSIS — C787 Secondary malignant neoplasm of liver and intrahepatic bile duct: Secondary | ICD-10-CM | POA: Diagnosis not present

## 2019-05-05 DIAGNOSIS — Z5112 Encounter for antineoplastic immunotherapy: Secondary | ICD-10-CM | POA: Diagnosis not present

## 2019-05-05 MED ORDER — SODIUM CHLORIDE 0.9 % IV SOLN
5.4000 mg/kg | Freq: Once | INTRAVENOUS | Status: AC
  Administered 2019-05-05: 600 mg via INTRAVENOUS
  Filled 2019-05-05: qty 20

## 2019-05-05 MED ORDER — SODIUM CHLORIDE 0.9% FLUSH
10.0000 mL | INTRAVENOUS | Status: DC | PRN
  Administered 2019-05-05: 16:00:00 10 mL
  Filled 2019-05-05: qty 10

## 2019-05-05 MED ORDER — HEPARIN SOD (PORK) LOCK FLUSH 100 UNIT/ML IV SOLN
500.0000 [IU] | Freq: Once | INTRAVENOUS | Status: AC | PRN
  Administered 2019-05-05: 500 [IU]
  Filled 2019-05-05: qty 5

## 2019-05-05 MED ORDER — SODIUM CHLORIDE 0.9 % IV SOLN
Freq: Once | INTRAVENOUS | Status: AC
  Administered 2019-05-05: 13:00:00 via INTRAVENOUS
  Filled 2019-05-05: qty 250

## 2019-05-05 NOTE — Patient Instructions (Signed)
[facility] Discharge Instructions for Patients Receiving Chemotherapy  Today you received the following chemotherapy agents: Vectibix.  To help prevent nausea and vomiting after your treatment, we encourage you to take your nausea medication as directed.   If you develop nausea and vomiting that is not controlled by your nausea medication, call the clinic.   BELOW ARE SYMPTOMS THAT SHOULD BE REPORTED IMMEDIATELY:  *FEVER GREATER THAN 100.5 F  *CHILLS WITH OR WITHOUT FEVER  NAUSEA AND VOMITING THAT IS NOT CONTROLLED WITH YOUR NAUSEA MEDICATION  *UNUSUAL SHORTNESS OF BREATH  *UNUSUAL BRUISING OR BLEEDING  TENDERNESS IN MOUTH AND THROAT WITH OR WITHOUT PRESENCE OF ULCERS  *URINARY PROBLEMS  *BOWEL PROBLEMS  UNUSUAL RASH Items with * indicate a potential emergency and should be followed up as soon as possible.  Feel free to call the clinic should you have any questions or concerns. The clinic phone number is [phone].  Please show the CHEMO ALERT CARD at check-in to the Emergency Department and triage nurse.   

## 2019-05-05 NOTE — Telephone Encounter (Signed)
R/s appt per 7/30 sch message - unable to reach pt and unable to leave message. Sent a message to RN in treatment area today to print out a new schedule when patient comes in for his treatment. RN Delle Reining and Hulan Fess aware.

## 2019-05-06 ENCOUNTER — Encounter: Payer: Self-pay | Admitting: Hematology

## 2019-05-09 NOTE — Progress Notes (Signed)
  Radiation Oncology         [phone]) [phone] ________________________________  Name: Billy Mendoza MRN: [id number]  Date: 03/21/2019  DOB: 1951/01/17  End of Treatment Note  Diagnosis:   68 y.o. male with Stage IV poorly differentiated adenocarcinoma of the rectum    Indication for treatment:  Palliative, Local Control       Radiation treatment dates:   02/22/2019 - 03/21/2019  Site/dose:   Rectum / 37.5 Gy in 15 fractions  Beams/energy:   3D / 15X, 10X Photon  Narrative: The patient tolerated radiation treatment relatively well.   He experienced increased fatigue and some bowel irritation, alternating between constipation and loose stools. No complaints of skin irritation. He also noted some dysuria with occasional blood; advised using OTC Azo.  Plan: The patient has completed radiation treatment. The patient will return to radiation oncology clinic for routine followup in one month. I advised them to call or return sooner if they have any questions or concerns related to their recovery or treatment.  ------------------------------------------------  Jodelle Gross, MD, PhD  This document serves as a record of services personally performed by Kyung Rudd, MD. It was created on his behalf by Rae Lips, a trained medical scribe. The creation of this record is based on the scribe's personal observations and the provider's statements to them. This document has been checked and approved by the attending provider.

## 2019-05-10 ENCOUNTER — Telehealth: Payer: Self-pay

## 2019-05-10 NOTE — Telephone Encounter (Signed)
Patient's wife Vania Rea had called about clearance for dental cleaning, this was faxed over to Dr. Estrella Myrtle office .  Also she states that he has increased pain for the past two nights, stating he feels like he is going to push the stent out and describes black specs in the toilet when he has a bowel movement.  She has asked him not to flush the stool so she can see what he is talking about.  Her [phone]   _________________________________ Information given to Dr. Burr Medico for review.

## 2019-05-11 ENCOUNTER — Telehealth: Payer: Self-pay

## 2019-05-11 NOTE — Telephone Encounter (Signed)
Faxed medical clearance for dental cleaning to Dr. Annitta Jersey at fax [phone], sent to HIM for scan to chart.

## 2019-05-14 ENCOUNTER — Other Ambulatory Visit: Payer: Self-pay | Admitting: Nurse Practitioner

## 2019-05-16 NOTE — Progress Notes (Deleted)
[place]   [phone] [phone]   Clinic Follow up Note   Patient Care Team: Merry Proud, MD as PCP - General (Internal Medicine) Truitt Merle, MD as Consulting Physician (Hematology) Carol Ada, MD as Consulting Physician (Gastroenterology) 05/16/2019  CHIEF COMPLAINT: Follow-up metastatic rectal cancer  SUMMARY OF ONCOLOGIC HISTORY: Oncology History Overview Note  Cancer Staging Rectal cancer metastasized to liver [facility]) Staging form: Colon and Rectum, AJCC 8th Edition - Clinical stage from 07/28/2018: Stage IVB (cTX, cNX, pM1b) - Signed by Truitt Merle, MD on 08/18/2018     Rectal cancer metastasized to liver ([place])  07/28/2018 Initial Biopsy   Biopsy 07/28/18 Final Microscopic Diagnosis A.Colon, Ascending, Polyp:  -Fragments of tubular adenoma -No high-grade dysplasia of malignancy.  B. Rectum, Mass, Polyp:  -Invasive adenocarcinoma, moderately - poorly differentiatred.    07/28/2018 Cancer Staging   Staging form: Colon and Rectum, AJCC 8th Edition - Clinical stage from 07/28/2018: Stage IVB (cTX, cNX, pM1b) - Signed by Truitt Merle, MD on 08/18/2018   08/04/2018 Initial Diagnosis   Rectal cancer metastasized to liver ([place])   08/16/2018 Pathology Results   Biospy  Diagnosis 08/16/18 Liver, needle/core biopsy - ADENOCARCINOMA. Microscopic Comment By morphology, the findings are compatible with the provided clinical history of colorectal adenocarcinoma. Results reported to [name] on 08/18/2018. Intradepartmental consultation was obtained ([name]).    08/16/2018 Miscellaneous   Foundation 1 genomic testing: MS-stable Tumor mutation burden  3/Mb K-ras/NRAS wild-type BRAF V600 E KDMSA R266Q PTEN T36f* SMAD4 loss TP53 loss     08/18/2018 Procedure   Colonoscopy by Dr. HBenson Norwayshowed malignant appearing partially obstructing tumor in the rectum, 8 cm above anal verge.   08/18/2018 - 09/07/2018 Chemotherapy   CAPOX q3weeks with Xeloda 20042mBID 2 weeks on, 1 weeks off starting 08/18/18.  Swithced treatment due to BRAF mutation.    09/07/2018 - 12/24/2018 Chemotherapy   FOLFOXIRI and avastin every 2 weeks starting 09/07/18. Oxaliplatin held starting with cycle 8 and continue with FOLFIRI and Avastin. D/c due to disease progression.    11/04/2018 Imaging   CT CAP 11/04/18 IMPRESSION: 1. Peripheral filling defect in the right main pulmonary artery extending primarily into the right lower lobe pulmonary artery. While the peripheral location indicates some degree of chronicity of this pulmonary embolus, this is a new finding compared to 07/28/2018. The right ventricular to left ventricular ratio is 0.7, which does not specifically indicate right heart strain. 2. The malignancy appears considerably improved, with reduced size pulmonary nodules; reduced size and increased internal necrosis of the hepatic metastatic lesions; marked improvement in the thoracic adenopathy; reduced conspicuity of the rectal tumor; and significant reduction in perirectal tumor burden/adenopathy. 3. Other imaging findings of potential clinical significance: Aortic Atherosclerosis (ICD10-I70.0). Coronary atherosclerosis. Bilateral renal cysts. Small umbilical hernia contains adipose tissue.  Critical Value/emergent results were called by telephone at the time of interpretation on 11/04/2018 at 3:05 pm to Dr. YATruitt Merle who verbally acknowledged these results.   01/18/2019 Imaging   CT AP W contrast  IMPRESSION: 1. Colonic obstruction resulting from rectal carcinoma. 2. Hepatic metastatic disease which is decreased in size compared with the prior examination of 11/04/2018. 3. Multiple right middle lobe and right lower lobe pulmonary nodules most consistent with metastatic disease.   02/01/2019 -  Chemotherapy   second line BRAFTOVI(encorafenib) 30043maily and MEKTOVI(binimetinib), 79m2m2h and Vectibix q2weeks starting  02/01/19.  -[facility] due to RT    02/13/2019  Imaging   CT Chest  IMPRESSION: Small bilateral pulmonary metastases, mildly progressive from January 2020.  Multifocal hepatic metastases, incompletely visualized, favored to be progressive from April 2020.  Aortic Atherosclerosis (ICD10-I70.0).    02/22/2019 - 03/21/2019 Radiation Therapy   -Palliative local radiation  -Holding mektovi (binimetinib) during RT -Due to significant rectal pain, diarrhea and stool incontinence, he stopped RT after 03/13/19. He was only able to finish 1/2 remianing treatments on 03/21/19.      CURRENT THERAPY: -Second lineBRAFTOVI(encorafenib)330m dailyand MEKTOVI(binimetinib) 455mq12hand Vectibix q2weeks starting4/29/20. Held MEKTOVI 02/19/19-7/2/20due to RT.  INTERVAL HISTORY: Mr. BiFedewaeturns for follow-up and treatment as scheduled. He completed cycle 7 panitumumab on 05/05/19, continues braftovi and mektovi.    REVIEW OF SYSTEMS:   Constitutional: Denies fevers, chills or abnormal weight loss Eyes: Denies blurriness of vision Ears, nose, mouth, throat, and face: Denies mucositis or sore throat Respiratory: Denies cough, dyspnea or wheezes Cardiovascular: Denies palpitation, chest discomfort or lower extremity swelling Gastrointestinal:  Denies nausea, heartburn or change in bowel habits Skin: Denies abnormal skin rashes Lymphatics: Denies new lymphadenopathy or easy bruising Neurological:Denies numbness, tingling or new weaknesses Behavioral/Psych: Mood is stable, no new changes  All other systems were reviewed with the patient and are negative.  MEDICAL HISTORY:  Past Medical History:  [facility]osis Date  . [facility]    SURGICAL HISTORY: Past Surgical History:  Procedure Laterality Date  . COLONIC STENT PLACEMENT N/A 01/19/2019   Procedure: COLONIC STENT PLACEMENT;  Surgeon: HuCarol AdaMD;  Location: [facility];  Service: Endoscopy;  Laterality: N/A;   . FLEXIBLE SIGMOIDOSCOPY N/A 01/19/2019   Procedure: FLEXIBLE SIGMOIDOSCOPY;  Surgeon: HuCarol AdaMD;  Location: [facility] ENDOS[facility];  Service: Endoscopy;  Laterality: N/A;  . IR IMAGING GUIDED PORT INSERTION  08/16/2018  . IR [place] BX ASP/DRAIN  08/16/2018    I have reviewed the social history and family history with the patient and they are unchanged from previous note.  ALLERGIES:  has No Known Allergies.  MEDICATIONS:  Current Outpatient Medications  Medication Sig Dispense Refill  . acetaminophen (TYLENOL) 325 MG tablet Take 650 mg by mouth every 6 (six) hours as needed for moderate pain.    . Binimetinib (MEKTOVI) 15 MG TABS Take 45 mg by mouth 2 (two) times a day. 180 tablet 3  . clindamycin (CLINDAGEL) 1 % gel Apply topically 2 (two) times daily. 60 g 0  . diphenoxylate-atropine (LOMOTIL) 2.5-0.025 MG tablet Take 1-2 tablets by mouth 4 (four) times daily as needed for diarrhea or loose stools. Up to 8 tabs daily for severe diarrhea 60 tablet 2  . doxycycline (VIBRA-TABS) 100 MG tablet TAKE 1 TABLET(100 MG) BY MOUTH TWICE DAILY 60 tablet 0  . Encorafenib (BRAFTOVI) 75 MG CAPS Take 300 mg by mouth daily. 120 capsule 3  . MAGNESIUM-OXIDE 400 (241.3 Mg) MG tablet TAKE 1 TABLET BY MOUTH DAILY 30 tablet 1  . morphine (MS CONTIN) 15 MG 12 hr tablet Take 1 tablet (15 mg total) by mouth every 12 (twelve) hours. 60 tablet 0  . oxyCODONE (OXY IR/ROXICODONE) 5 MG immediate release tablet Take 1-2 tablets (5-10 mg total) by mouth every 4 (four) hours as needed for severe pain. 90 tablet 0  . potassium chloride SA (K-DUR) 20 MEQ tablet Take 1 tablet (20 mEq total) by mouth 2 (two) times daily. 60 tablet 3  . traMADol (ULTRAM) 50 MG tablet Take 1 tablet (50 mg total) by mouth every 6 (six) hours as needed. 30[place]  tablet 0  . VITAMIN E PO Take 1 tablet by mouth daily.    Alveda Reasons 15 MG TABS tablet TAKE 1 TABLET(15 MG) BY MOUTH DAILY WITH SUPPER 30 tablet 1   No current facility-administered  medications for this visit.     PHYSICAL EXAMINATION: ECOG PERFORMANCE STATUS: {CHL ONC ECOG PS:[id number]}  There were no vitals filed for this visit. There were no vitals filed for this visit.  GENERAL:alert, no distress and comfortable SKIN: skin color, texture, turgor are normal, no rashes or significant lesions EYES: normal, Conjunctiva are pink and non-injected, sclera clear OROPHARYNX:no exudate, no erythema and lips, buccal mucosa, and tongue normal  NECK: supple, thyroid normal size, non-tender, without nodularity LYMPH:  no palpable lymphadenopathy in the cervical, axillary or inguinal LUNGS: clear to auscultation and percussion with normal breathing effort HEART: regular rate & rhythm and no murmurs and no lower extremity edema ABDOMEN:abdomen soft, non-tender and normal bowel sounds Musculoskeletal:no cyanosis of digits and no clubbing  NEURO: alert & oriented x 3 with fluent speech, no focal motor/sensory deficits  LABORATORY DATA:  I have reviewed the data as listed CBC Latest Ref Rng & Units 05/04/2019 04/20/2019 04/06/2019  WBC 4.0 - 10.5 K/uL 7.1 6.3 5.2  Hemoglobin 13.0 - 17.0 g/dL 11.0(L) 11.1(L) 10.6(L)  Hematocrit 39.0 - 52.0 % 34.7(L) 34.2(L) 33.2(L)  Platelets 150 - 400 K/uL 292 215 222     CMP Latest Ref Rng & Units 05/04/2019 04/20/2019 04/06/2019  Glucose 70 - 99 mg/dL 90 106(H) 121(H)  BUN 8 - 23 mg/dL _0 Creatinine 0.61 - 1.24 mg/dL 0.79 1.10 0.98  Sodium 135 - 145 mmol/L 140 141 138  Potassium 3.5 - 5.1 mmol/L 3.9 4.2 4.0  Chloride 98 - 111 mmol/L 110 109 110  CO2 22 - 32 mmol/L 22 22 19(L)  Calcium 8.9 - 10.3 mg/dL 8.3(L) 8.0(L) 8.1(L)  Total Protein 6.5 - 8.1 g/dL 6.3(L) 6.7 6.5  Total Bilirubin 0.3 - 1.2 mg/dL <0.2(L) <0.2(L) <0.2(L)  Alkaline Phos 38 - 126 U/L 192(H) 159(H) 135(H)  AST 15 - 41 U/L _1 ALT 0 - 44 U/L _2 RADIOGRAPHIC STUDIES: I have personally reviewed the radiological images as listed and agreed with  the findings in the report. No results found.   ASSESSMENT & PLAN:  No problem-specific Assessment & Plan notes found for this encounter.   No orders of the defined types were placed in this encounter.  All questions were answered. The patient knows to call the clinic with any problems, questions or concerns. No barriers to learning was detected. I spent {CHL ONC TIME VISIT - LPNPY:[id number]} counseling the patient face to face. The total time spent in the appointment was {CHL ONC TIME VISIT - BVAPO:[id number]} and more than 50% was on counseling and review of test results     Alla Feeling, NP 05/16/19

## 2019-05-17 ENCOUNTER — Inpatient Hospital Stay: Payer: BC Managed Care – PPO

## 2019-05-17 ENCOUNTER — Telehealth: Payer: Self-pay

## 2019-05-17 ENCOUNTER — Telehealth: Payer: Self-pay | Admitting: Nurse Practitioner

## 2019-05-17 ENCOUNTER — Inpatient Hospital Stay: Payer: BC Managed Care – PPO | Admitting: Nurse Practitioner

## 2019-05-17 NOTE — Progress Notes (Signed)
[place]   Telephone:(336) [phone] [phone]) [phone]   Clinic Follow up Note   Patient Care Team: Merry Proud, MD as PCP - General (Internal Medicine) Truitt Merle, MD as Consulting Physician (Hematology) Carol Ada, MD as Consulting Physician (Gastroenterology)  Date of Service:  05/19/2019  CHIEF COMPLAINT: 05/04/2019  SUMMARY OF ONCOLOGIC HISTORY: Oncology History Overview Note  Cancer Staging Rectal cancer metastasized to liver [facility]) Staging form: Colon and Rectum, AJCC 8th Edition - Clinical stage from 07/28/2018: Stage IVB (cTX, cNX, pM1b) - Signed by Truitt Merle, MD on 08/18/2018     Rectal cancer metastasized to liver ([place])  07/28/2018 Initial Biopsy   Biopsy 07/28/18 Final Microscopic Diagnosis A.Colon, Ascending, Polyp:  -Fragments of tubular adenoma -No high-grade dysplasia of malignancy.  B. Rectum, Mass, Polyp:  -Invasive adenocarcinoma, moderately - poorly differentiatred.    07/28/2018 Cancer Staging   Staging form: Colon and Rectum, AJCC 8th Edition - Clinical stage from 07/28/2018: Stage IVB (cTX, cNX, pM1b) - Signed by Truitt Merle, MD on 08/18/2018   08/04/2018 Initial Diagnosis   Rectal cancer metastasized to liver ([place])   08/16/2018 Pathology Results   Biospy  Diagnosis 08/16/18 Liver, needle/core biopsy - ADENOCARCINOMA. Microscopic Comment By morphology, the findings are compatible with the provided clinical history of colorectal adenocarcinoma. Results reported to [name] on 08/18/2018. Intradepartmental consultation was obtained ([name]).    08/16/2018 Miscellaneous   Foundation 1 genomic testing: MS-stable Tumor mutation burden  3/Mb K-ras/NRAS wild-type BRAF V600 E KDMSA R266Q PTEN T336f* SMAD4 loss TP53 loss     08/18/2018 Procedure   Colonoscopy by Dr. HBenson Norwayshowed malignant appearing partially obstructing tumor in the rectum, 8 cm above anal verge.   08/18/2018 - 09/07/2018 Chemotherapy   CAPOX  q3weeks with Xeloda 20041mBID 2 weeks on, 1 weeks off starting 08/18/18.  Swithced treatment due to BRAF mutation.    09/07/2018 - 12/24/2018 Chemotherapy   FOLFOXIRI and avastin every 2 weeks starting 09/07/18. Oxaliplatin held starting with cycle 8 and continue with FOLFIRI and Avastin. D/c due to disease progression.    11/04/2018 Imaging   CT CAP 11/04/18 IMPRESSION: 1. Peripheral filling defect in the right main pulmonary artery extending primarily into the right lower lobe pulmonary artery. While the peripheral location indicates some degree of chronicity of this pulmonary embolus, this is a new finding compared to 07/28/2018. The right ventricular to left ventricular ratio is 0.7, which does not specifically indicate right heart strain. 2. The malignancy appears considerably improved, with reduced size pulmonary nodules; reduced size and increased internal necrosis of the hepatic metastatic lesions; marked improvement in the thoracic adenopathy; reduced conspicuity of the rectal tumor; and significant reduction in perirectal tumor burden/adenopathy. 3. Other imaging findings of potential clinical significance: Aortic Atherosclerosis (ICD10-I70.0). Coronary atherosclerosis. Bilateral renal cysts. Small umbilical hernia contains adipose tissue.  Critical Value/emergent results were called by telephone at the time of interpretation on 11/04/2018 at 3:05 pm to Dr. YATruitt Merle who verbally acknowledged these results.   01/18/2019 Imaging   CT AP W contrast  IMPRESSION: 1. Colonic obstruction resulting from rectal carcinoma. 2. Hepatic metastatic disease which is decreased in size compared with the prior examination of 11/04/2018. 3. Multiple right middle lobe and right lower lobe pulmonary nodules most consistent with metastatic disease.   02/01/2019 -  Chemotherapy   second line BRAFTOVI(encorafenib) 30055maily and MEKTOVI(binimetinib), 40m62m2h and Vectibix q2weeks starting  02/01/19.  -[facility]  02/13/2019 Imaging   CT Chest  IMPRESSION: Small bilateral pulmonary metastases, mildly progressive from January 2020.  Multifocal hepatic metastases, incompletely visualized, favored to be progressive from April 2020.  Aortic Atherosclerosis (ICD10-I70.0).    02/22/2019 - 03/21/2019 Radiation Therapy   -Palliative local radiation  -Holding mektovi (binimetinib) during RT -Due to significant rectal pain, diarrhea and stool incontinence, he stopped RT after 03/13/19. He was only able to finish 1/2 remianing treatments on 03/21/19.       CURRENT THERAPY:  -Second lineBRAFTOVI(encorafenib)'300mg'$  dailyand MEKTOVI(binimetinib) '45mg'$  q12hand Vectibix q2weeks starting4/29/20. Held MEKTOVI 02/19/19-7/2/20due to RT.  INTERVAL HISTORY:  Billy Mendoza is here for a follow up and treatment. He presents to the clinic alone. He notes he is doing well. He notes mild to moderate fatigue and loss of appetite towards the end of the day. 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 He notes he is sleeping adequately. He notes 1 bout of diarrhea a few days. He does have mild rectal bleeding. He notes his only rectal discomfort is from his stent. He spoke with Dr Benson Norway who notes his stent is permanent but can take it out if necessary. He notes residual numbness of his toes form prior oxaliplatin.    REVIEW OF SYSTEMS:   Constitutional: Denies fevers, chills or abnormal weight loss Eyes: Denies blurriness of vision Ears, nose, mouth, throat, and face: Denies mucositis or sore throat Respiratory: Denies cough, dyspnea or wheezes Cardiovascular: Denies palpitation, chest discomfort or lower extremity swelling Gastrointestinal:  Denies nausea, heartburn or change in bowel habits Skin: Denies abnormal skin rashes Lymphatics: Denies new lymphadenopathy or easy bruising Neurological: (+) residual numbness of toes.  Behavioral/Psych: Mood is stable, no  new changes  All other systems were reviewed with the patient and are negative.  MEDICAL HISTORY:  Past Medical History:  Diagnosis Date  . Cancer [facility])     SURGICAL HISTORY: Past Surgical History:  Procedure Laterality Date  . COLONIC STENT PLACEMENT N/A 01/19/2019   Procedure: COLONIC STENT PLACEMENT;  Surgeon: Carol Ada, MD;  Location: [facility] ENDOSCOPY;  Service: Endoscopy;  Laterality: N/A;  . FLEXIBLE SIGMOIDOSCOPY N/A 01/19/2019   Procedure: FLEXIBLE SIGMOIDOSCOPY;  Surgeon: Carol Ada, MD;  Location: [facility] ENDOSCOPY;  Service: Endoscopy;  Laterality: N/A;  . IR IMAGING GUIDED PORT INSERTION  08/16/2018  . IR US GUIDE BX ASP/DRAIN  08/16/2018    I have reviewed the social history and family history with the patient and they are unchanged from previous note.  ALLERGIES:  has No Known Allergies.  MEDICATIONS:  Current Outpatient Medications  Medication Sig Dispense Refill  . acetaminophen (TYLENOL) 325 MG tablet Take 650 mg by mouth every 6 (six) hours as needed for moderate pain.    . Binimetinib (MEKTOVI) 15 MG TABS Take 45 mg by mouth 2 (two) times a day. 180 tablet 3  . clindamycin (CLINDAGEL) 1 % gel Apply topically 2 (two) times daily. 60 g 0  . diphenoxylate-atropine (LOMOTIL) 2.5-0.025 MG tablet Take 1-2 tablets by mouth 4 (four) times daily as needed for diarrhea or loose stools. Up to 8 tabs daily for severe diarrhea 60 tablet 2  . doxycycline (VIBRA-TABS) 100 MG tablet TAKE 1 TABLET(100 MG) BY MOUTH TWICE DAILY 60 tablet 0  . Encorafenib (BRAFTOVI) 75 MG CAPS Take 300 mg by mouth daily. 120 capsule 3  . MAGNESIUM-OXIDE 400 (241.3 Mg) MG tablet TAKE 1 TABLET BY MOUTH DAILY 30 tablet 1  . morphine (MS CONTIN) 15 MG 12 hr tablet Take  1 tablet (15 mg total) by mouth every 12 (twelve) hours. 60 tablet 0  . oxyCODONE (OXY IR/ROXICODONE) 5 MG immediate release tablet Take 1-2 tablets (5-10 mg total) by mouth every 4 (four) hours as needed for severe pain. 90 tablet 0  .  potassium chloride SA (K-DUR) 20 MEQ tablet Take 1 tablet (20 mEq total) by mouth 2 (two) times daily. 60 tablet 3  . traMADol (ULTRAM) 50 MG tablet Take 1 tablet (50 mg total) by mouth every 6 (six) hours as needed. 30 tablet 0  . VITAMIN E PO Take 1 tablet by mouth daily.    Alveda Reasons 15 MG TABS tablet TAKE 1 TABLET(15 MG) BY MOUTH DAILY WITH SUPPER 30 tablet 1   No current facility-administered medications for this visit.     PHYSICAL EXAMINATION: ECOG PERFORMANCE STATUS: 1 - Symptomatic but completely ambulatory  Vitals:   05/19/19 1257  BP: (!) 134/95  Pulse: 89  Resp: 20  Temp: 98.7 F (37.1 C)  SpO2: 100%   Filed Weights   05/19/19 1257  Weight: 232 lb 4.8 oz (105.4 kg)    GENERAL:alert, no distress and comfortable SKIN: skin color, texture, turgor are normal, no rashes or significant lesions EYES: normal, Conjunctiva are pink and non-injected, sclera clear  NECK: supple, thyroid normal size, non-tender, without nodularity LYMPH:  no palpable lymphadenopathy in the cervical, axillary  LUNGS: clear to auscultation and percussion with normal breathing effort HEART: regular rate & rhythm and no murmurs and no lower extremity edema ABDOMEN:abdomen soft, non-tender and normal bowel sounds Musculoskeletal:no cyanosis of digits and no clubbing  NEURO: alert & oriented x 3 with fluent speech, no focal motor/sensory deficits  LABORATORY DATA:  I have reviewed the data as listed CBC Latest Ref Rng & Units 05/19/2019 05/04/2019 04/20/2019  WBC 4.0 - 10.5 K/uL 7.5 7.1 6.3  Hemoglobin 13.0 - 17.0 g/dL 10.5(L) 11.0(L) 11.1(L)  Hematocrit 39.0 - 52.0 % 33.3(L) 34.7(L) 34.2(L)  Platelets 150 - 400 K/uL 206 292 215     CMP Latest Ref Rng & Units 05/19/2019 05/04/2019 04/20/2019  Glucose 70 - 99 mg/dL 134(H) 90 106(H)  BUN 8 - 23 mg/dL _0 Creatinine 0.61 - 1.24 mg/dL 1.03 0.79 1.10  Sodium 135 - 145 mmol/L 140 140 141  Potassium 3.5 - 5.1 mmol/L 3.9 3.9 4.2  Chloride 98 -  111 mmol/L 108 110 109  CO2 22 - 32 mmol/L 21(L) 22 22  Calcium 8.9 - 10.3 mg/dL 8.4(L) 8.3(L) 8.0(L)  Total Protein 6.5 - 8.1 g/dL 6.3(L) 6.3(L) 6.7  Total Bilirubin 0.3 - 1.2 mg/dL 0.2(L) <0.2(L) <0.2(L)  Alkaline Phos 38 - 126 U/L 229(H) 192(H) 159(H)  AST 15 - 41 U/L _1 ALT 0 - 44 U/L _2 RADIOGRAPHIC STUDIES: I have personally reviewed the radiological images as listed and agreed with the findings in the report. No results found.   ASSESSMENT & PLAN:  Billy Mendoza is a 68 y.o. male with   1. Proximal rectal adenocarcinoma, metastatic to liver and likely tolungsandnodes, MSS,KRAS/NRAS wild type,BRAF V600E mutation (+) -He was diagnosed in10/2019.He completed 1 cycle of CAPOX before changing todose reducedFOLFOXIRI andAvastindue to BRAF mutation in his tumor. -Hismetastatic diseaseis not curablewith surgery, he will continue treatment for as long as he can tolerate to control disease. -He was hospitalized on 01/18/19 forbowel obstruction due to rectal cancer. He had stent placed by Dr. Benson Norway. His CT AP showed his  known rectaltumor andobstruction, liver mets have decreased in size and multiple new right lung nodules consistent with metastasis. -I have changed his chemo to second-lineoralBRAF/MEK/EGFR inhibitors(Braftovi, Mektovi andVectibix q2weeks)which he started on 02/01/19. -He started Radiation 02/22/19.Due to significant rectal pain, diarrhea and stool incontinence, heheld RT for 3-4 days and completedon 03/21/19.Braftovi and Vectibix was held briefly also and restarted. Irean Hong was held 02/19/19-7/2/20due to RT.He has recovered well, rectal pain much improved and uses less pain meds.  -He still has mild to moderate rectal discomfort from stent. I discussed if his rectal mass decreases significantly I will discuss stent removal with Dr. Benson Norway. I also explained if tumor starts to grow I would recommend another stent placement to protect him  from obstruction. He understands and is interested.  -Labs reviewed, overall stable. Urine protein negative. Iron panel still pending. Overall adequate to proceed with Vectibix today and continue every 2 weeks  -Continue Braftovi and Mektovi  -His CEA did moderately increase last time, he is clinically stable otherwise. Next CT scan 8/26.  -We briefly discussed looking into clinical trail and possibly returning to prior treatment drugs if he progresses again.  -F/u in 2 weeks   2. History of bowel obstruction from rectal tumor,severerectal pain -s/p stent placement by Dr. Benson Norway on 01/19/2019,he declined loop colostomybut agree to keep surgery as last resort.  -He understands thehigh possibility of recurrent bowel obstruction due to tumor progression. -He has met with dietician. He will continue with low residual diet. -For constipationhe will continue Colase every 1-2 days to prevent constipation/SBO. He denies diarrhea.He still has imodium and laxatives for any severe changes.  -Tramadol was not controlling rectal pain.He wastemporarilyon long acting MS Contin 73m BID.  -His 03/2019 f/u with Dr HBenson Norwayshowed his fissure was the main cause of hisrectalpain. -Aftershort course palliativeradiation5-03/2019,his rectal pain much improved. He still has some discomfort from stent, now mostly at 1-3/10. He has only needed Tylenol lately. He still has oxycodone 559mq6hours as needed for his significant pain.  -rectal pain mostly mild and manageable, and related to his stent.   3. Rectal bleeding -likely related to tumor bleeding and anticoagulation (Xarelto 20 mg daily)- bleeding improved with lower dose, he is on 1556maily now -Has occurred intermittently since diagnosis, improves for short period after treatments -He will hold Xarelto for moderate bleeding with clots. For mild bleeding he can continue Xarelto 35m27mily. Rectal bleeding very mild recently.   4. Iron deficiency  anemia -secondary to blood loss from tumor  -ser iron 29, TIBC 369, 8% saturation ratio, consistent with iron deficiency  -He can start prenatal vitamin with iron once daily -He receivedinitial 2 doses ofIV Feraheme on 5/20/20and 03/02/19.  -Hg at10.5 today (05/19/19), ferritin normal today   5.History of right PE -His PE is likely related to his underlying metastatic colon cancer and chemotherapy, especially Avastin. -He is on Xarelto 20 mg daily and will continue indefinitely. Has occasionally epistaxis secondary to Avastin,thrombocytopenia and Xarelto.  6. HTN  -TakesAmlodipine -BPusually controlled. BP elevated recently. 134/95 today (05/19/19)  7. Acne-type skin rash -secondary toPanitumumab,developed after cycle 1 -Has clindamycin gel BID and hydrocortisone and doxycycline 100 mg BID -Given his rash is very mild on face and scalp with doxycycline alone he has not been using topical treatment.   8. Goal of care discussion -He is full code now -He understands his disease is incurable,and the goal of therapy is palliative to prolong his life.   Plan -He is clinically doing well  -  Labs reviewedand adequate to proceed withVectibix today -continueBraftoviand Mektovi  -Lab, flush, and Vectibix in 2 and 4 weeks -CT CAP W Contrast on 8/26   No problem-specific Assessment & Plan notes found for this encounter.   No orders of the defined types were placed in this encounter.  All questions were answered. The patient knows to call the clinic with any problems, questions or concerns. No barriers to learning was detected. I spent 20 minutes counseling the patient face to face. The total time spent in the appointment was 25 minutes and more than 50% was on counseling and review of test results     Truitt Merle, MD 05/19/2019   I, Joslyn Devon, am acting as scribe for Truitt Merle, MD.   I have reviewed the above documentation for accuracy and completeness, and  I agree with the above.

## 2019-05-17 NOTE — Telephone Encounter (Signed)
Patient's wife calls stating he had 4 episodes of diarrhea last night.  None today.  He got mixed up on his appointments not coming today.  High priority scheduling message was sent to reschedule for tomorrow or Friday.

## 2019-05-17 NOTE — Telephone Encounter (Signed)
Called per 8/12 sch message - to reschedule appt . Unable to reach pt . Left message with on Kai Levins phone

## 2019-05-18 ENCOUNTER — Ambulatory Visit: Payer: BC Managed Care – PPO | Admitting: Nurse Practitioner

## 2019-05-18 ENCOUNTER — Other Ambulatory Visit: Payer: BC Managed Care – PPO

## 2019-05-18 ENCOUNTER — Ambulatory Visit: Payer: BC Managed Care – PPO

## 2019-05-18 NOTE — Progress Notes (Signed)
  Radiation Oncology         [phone] ________________________________  Name: Billy Mendoza MRN: [id number]  Date: 02/16/2019  DOB: December 18, 1950  Optical Surface Tracking Plan:  Since intensity modulated radiotherapy (IMRT) and 3D conformal radiation treatment methods are predicated on accurate and precise positioning for treatment, intrafraction motion monitoring is medically necessary to ensure accurate and safe treatment delivery.  The ability to quantify intrafraction motion without excessive ionizing radiation dose can only be performed with optical surface tracking. Accordingly, surface imaging offers the opportunity to obtain 3D measurements of patient position throughout IMRT and 3D treatments without excessive radiation exposure.  I am ordering optical surface tracking for this patient's upcoming course of radiotherapy. ________________________________  Kyung Rudd, MD 05/18/2019 4:07 PM    Reference:   Ursula Alert, J, et al. Surface imaging-based analysis of intrafraction motion for breast radiotherapy patients.Journal of [place], n. 6, nov. 2014. ISSN [id number].   Available at: <http://www.jacmp.org/index.php/jacmp/article/view/4957>.

## 2019-05-18 NOTE — Progress Notes (Signed)
  Radiation Oncology         [phone] ________________________________  Name: Arieon Corcoran MRN: [id number]  Date: 02/16/2019  DOB: December 03, 1950   SIMULATION AND TREATMENT PLANNING NOTE  DIAGNOSIS:     ICD-10-CM   1. Rectal cancer metastasized to liver ([place])  C20    C78.7      The patient presented for simulation for the patient's upcoming course of radiation for the diagnosis of rectal cancer. The patient was placed in a supine position. A customized vac-lock bag was constructed to aid in patient immobilization on. This complex treatment device will be used on a daily basis during the treatment. In this fashion a CT scan was obtained through the pelvic region and the isocenter was placed near midline within the pelvis. Surface markings were placed.  The patient's imaging was loaded into the radiation treatment planning system. The patient will be planned to receive a course of radiation to a dose of 37.5 Gy. This will be accomplished in 15 fractions at 2.5 gray per fraction. This initial treatment will correspond to a 3-D conformal technique. The target has been contoured in addition to the rectum, bladder and femoral heads. Dose volume histograms of each of these structures have been requested and these will be carefully reviewed as part of the 3-D conformal treatment planning process. To accomplish this initial treatment, 4 customized blocks have been designed for this purpose. Each of these 4 complex treatment devices will be used on a daily basis during the course of the treatment.     Special treatment procedure The patient will receive chemotherapy during the course of radiation treatment. The patient may experience increased or overlapping toxicity due to this combined-modality approach and the patient will be monitored for such problems. This may include extra lab work as necessary. This therefore constitutes a special treatment procedure.    ________________________________   Jodelle Gross, MD, PhD

## 2019-05-19 ENCOUNTER — Inpatient Hospital Stay (HOSPITAL_BASED_OUTPATIENT_CLINIC_OR_DEPARTMENT_OTHER): Payer: BC Managed Care – PPO | Admitting: Hematology

## 2019-05-19 ENCOUNTER — Telehealth: Payer: Self-pay | Admitting: Hematology

## 2019-05-19 ENCOUNTER — Encounter: Payer: Self-pay | Admitting: Hematology

## 2019-05-19 ENCOUNTER — Inpatient Hospital Stay: Payer: BC Managed Care – PPO

## 2019-05-19 ENCOUNTER — Inpatient Hospital Stay: Payer: BC Managed Care – PPO | Attending: Adult Health

## 2019-05-19 ENCOUNTER — Other Ambulatory Visit: Payer: Self-pay

## 2019-05-19 VITALS — BP 134/95 | HR 89 | Temp 98.7°F | Resp 20 | Ht 73.0 in | Wt 232.3 lb

## 2019-05-19 DIAGNOSIS — C20 Malignant neoplasm of rectum: Secondary | ICD-10-CM

## 2019-05-19 DIAGNOSIS — I2699 Other pulmonary embolism without acute cor pulmonale: Secondary | ICD-10-CM | POA: Diagnosis not present

## 2019-05-19 DIAGNOSIS — Z7901 Long term (current) use of anticoagulants: Secondary | ICD-10-CM | POA: Insufficient documentation

## 2019-05-19 DIAGNOSIS — C787 Secondary malignant neoplasm of liver and intrahepatic bile duct: Secondary | ICD-10-CM

## 2019-05-19 DIAGNOSIS — I10 Essential (primary) hypertension: Secondary | ICD-10-CM | POA: Insufficient documentation

## 2019-05-19 DIAGNOSIS — D500 Iron deficiency anemia secondary to blood loss (chronic): Secondary | ICD-10-CM | POA: Diagnosis not present

## 2019-05-19 DIAGNOSIS — D509 Iron deficiency anemia, unspecified: Secondary | ICD-10-CM | POA: Diagnosis not present

## 2019-05-19 DIAGNOSIS — Z5112 Encounter for antineoplastic immunotherapy: Secondary | ICD-10-CM | POA: Diagnosis not present

## 2019-05-19 LAB — CBC WITH DIFFERENTIAL (CANCER CENTER ONLY)
Abs Immature Granulocytes: 0.08 10*3/uL — ABNORMAL HIGH (ref 0.00–0.07)
Basophils Absolute: 0.1 10*3/uL (ref 0.0–0.1)
Basophils Relative: 1 %
Eosinophils Absolute: 0.3 10*3/uL (ref 0.0–0.5)
Eosinophils Relative: 4 %
HCT: 33.3 % — ABNORMAL LOW (ref 39.0–52.0)
Hemoglobin: 10.5 g/dL — ABNORMAL LOW (ref 13.0–17.0)
Immature Granulocytes: 1 %
Lymphocytes Relative: 24 %
Lymphs Abs: 1.8 10*3/uL (ref 0.7–4.0)
MCH: 30.9 pg (ref 26.0–34.0)
MCHC: 31.5 g/dL (ref 30.0–36.0)
MCV: 97.9 fL (ref 80.0–100.0)
Monocytes Absolute: 0.6 10*3/uL (ref 0.1–1.0)
Monocytes Relative: 8 %
Neutro Abs: 4.7 10*3/uL (ref 1.7–7.7)
Neutrophils Relative %: 62 %
Platelet Count: 206 10*3/uL (ref 150–400)
RBC: 3.4 MIL/uL — ABNORMAL LOW (ref 4.22–5.81)
RDW: 16.9 % — ABNORMAL HIGH (ref 11.5–15.5)
WBC Count: 7.5 10*3/uL (ref 4.0–10.5)
nRBC: 0 % (ref 0.0–0.2)

## 2019-05-19 LAB — MAGNESIUM: Magnesium: 1.5 mg/dL — ABNORMAL LOW (ref 1.7–2.4)

## 2019-05-19 LAB — CMP (CANCER CENTER ONLY)
ALT: 18 U/L (ref 0–44)
AST: 21 U/L (ref 15–41)
Albumin: 2.5 g/dL — ABNORMAL LOW (ref 3.5–5.0)
Alkaline Phosphatase: 229 U/L — ABNORMAL HIGH (ref 38–126)
Anion gap: 11 (ref 5–15)
BUN: 19 mg/dL (ref 8–23)
CO2: 21 mmol/L — ABNORMAL LOW (ref 22–32)
Calcium: 8.4 mg/dL — ABNORMAL LOW (ref 8.9–10.3)
Chloride: 108 mmol/L (ref 98–111)
Creatinine: 1.03 mg/dL (ref 0.61–1.24)
GFR, Est AFR Am: >60 mL/min (ref >60)
GFR, Estimated: >60 mL/min (ref >60)
Glucose, Bld: 134 mg/dL — ABNORMAL HIGH (ref 70–99)
Potassium: 3.9 mmol/L (ref 3.5–5.1)
Sodium: 140 mmol/L (ref 135–145)
Total Bilirubin: 0.2 mg/dL — ABNORMAL LOW (ref 0.3–1.2)
Total Protein: 6.3 g/dL — ABNORMAL LOW (ref 6.5–8.1)

## 2019-05-19 LAB — IRON AND TIBC
Iron: 17 ug/dL — ABNORMAL LOW (ref 42–163)
Saturation Ratios: 9 % — ABNORMAL LOW (ref 20–55)
TIBC: 185 ug/dL — ABNORMAL LOW (ref 202–409)
UIBC: 168 ug/dL (ref 117–376)

## 2019-05-19 LAB — TOTAL PROTEIN, URINE DIPSTICK: Protein, ur: NEGATIVE mg/dL

## 2019-05-19 MED ORDER — SODIUM CHLORIDE 0.9% FLUSH
10.0000 mL | INTRAVENOUS | Status: DC | PRN
  Filled 2019-05-19: qty 10

## 2019-05-19 MED ORDER — MAGNESIUM SULFATE 2 GM/50ML IV SOLN
2.0000 g | Freq: Once | INTRAVENOUS | Status: AC
  Administered 2019-05-19: 16:00:00 2 g via INTRAVENOUS
  Filled 2019-05-19: qty 50

## 2019-05-19 MED ORDER — SODIUM CHLORIDE 0.9 % IV SOLN
Freq: Once | INTRAVENOUS | Status: AC
  Administered 2019-05-19: 14:00:00 via INTRAVENOUS
  Filled 2019-05-19: qty 250

## 2019-05-19 MED ORDER — SODIUM CHLORIDE 0.9 % IV SOLN
5.4000 mg/kg | Freq: Once | INTRAVENOUS | Status: AC
  Administered 2019-05-19: 600 mg via INTRAVENOUS
  Filled 2019-05-19: qty 10

## 2019-05-19 MED ORDER — HEPARIN SOD (PORK) LOCK FLUSH 100 UNIT/ML IV SOLN
500.0000 [IU] | Freq: Once | INTRAVENOUS | Status: DC | PRN
  Filled 2019-05-19: qty 5

## 2019-05-19 NOTE — Telephone Encounter (Signed)
No los per 8/14.

## 2019-05-19 NOTE — Patient Instructions (Signed)

## 2019-05-19 NOTE — Patient Instructions (Signed)
[facility] Discharge Instructions for Patients Receiving Chemotherapy  Today you received the following chemotherapy agents: Vectibix.  To help prevent nausea and vomiting after your treatment, we encourage you to take your nausea medication as directed.   If you develop nausea and vomiting that is not controlled by your nausea medication, call the clinic.   BELOW ARE SYMPTOMS THAT SHOULD BE REPORTED IMMEDIATELY:  *FEVER GREATER THAN 100.5 F  *CHILLS WITH OR WITHOUT FEVER  NAUSEA AND VOMITING THAT IS NOT CONTROLLED WITH YOUR NAUSEA MEDICATION  *UNUSUAL SHORTNESS OF BREATH  *UNUSUAL BRUISING OR BLEEDING  TENDERNESS IN MOUTH AND THROAT WITH OR WITHOUT PRESENCE OF ULCERS  *URINARY PROBLEMS  *BOWEL PROBLEMS  UNUSUAL RASH Items with * indicate a potential emergency and should be followed up as soon as possible.  Feel free to call the clinic should you have any questions or concerns. The clinic phone number is [phone].  Please show the CHEMO ALERT CARD at check-in to the Emergency Department and triage nurse.   

## 2019-05-20 ENCOUNTER — Encounter: Payer: Self-pay | Admitting: Hematology

## 2019-05-22 ENCOUNTER — Ambulatory Visit (HOSPITAL_COMMUNITY): Payer: BC Managed Care – PPO

## 2019-05-31 ENCOUNTER — Inpatient Hospital Stay: Payer: BC Managed Care – PPO

## 2019-05-31 ENCOUNTER — Encounter (HOSPITAL_COMMUNITY): Payer: Self-pay

## 2019-05-31 ENCOUNTER — Other Ambulatory Visit: Payer: Self-pay

## 2019-05-31 ENCOUNTER — Inpatient Hospital Stay (HOSPITAL_COMMUNITY)
Admission: EM | Admit: 2019-05-31 | Discharge: 2019-06-02 | DRG: 176 | Disposition: A | Discharge status: Home / Self Care | Payer: BC Managed Care – PPO | Source: Ambulatory Visit | Attending: Internal Medicine | Admitting: Internal Medicine

## 2019-05-31 ENCOUNTER — Ambulatory Visit (HOSPITAL_COMMUNITY)
Admission: RE | Admit: 2019-05-31 | Discharge: 2019-05-31 | Disposition: A | Discharge status: Home / Self Care | Payer: BC Managed Care – PPO | Source: Ambulatory Visit | Attending: Nurse Practitioner | Admitting: Nurse Practitioner

## 2019-05-31 ENCOUNTER — Inpatient Hospital Stay (HOSPITAL_BASED_OUTPATIENT_CLINIC_OR_DEPARTMENT_OTHER): Payer: BC Managed Care – PPO | Admitting: Medical

## 2019-05-31 ENCOUNTER — Encounter: Payer: Self-pay | Admitting: Medical

## 2019-05-31 VITALS — BP 138/103 | HR 138 | Temp 98.0°F | Resp 20

## 2019-05-31 DIAGNOSIS — C787 Secondary malignant neoplasm of liver and intrahepatic bile duct: Secondary | ICD-10-CM | POA: Diagnosis not present

## 2019-05-31 DIAGNOSIS — I82432 Acute embolism and thrombosis of left popliteal vein: Secondary | ICD-10-CM | POA: Diagnosis present

## 2019-05-31 DIAGNOSIS — C19 Malignant neoplasm of rectosigmoid junction: Secondary | ICD-10-CM | POA: Diagnosis not present

## 2019-05-31 DIAGNOSIS — C20 Malignant neoplasm of rectum: Secondary | ICD-10-CM

## 2019-05-31 DIAGNOSIS — R9431 Abnormal electrocardiogram [ECG] [EKG]: Secondary | ICD-10-CM | POA: Diagnosis present

## 2019-05-31 DIAGNOSIS — I2699 Other pulmonary embolism without acute cor pulmonale: Secondary | ICD-10-CM

## 2019-05-31 DIAGNOSIS — Z20828 Contact with and (suspected) exposure to other viral communicable diseases: Secondary | ICD-10-CM | POA: Diagnosis not present

## 2019-05-31 DIAGNOSIS — D509 Iron deficiency anemia, unspecified: Secondary | ICD-10-CM | POA: Diagnosis present

## 2019-05-31 DIAGNOSIS — Z8249 Family history of ischemic heart disease and other diseases of the circulatory system: Secondary | ICD-10-CM

## 2019-05-31 DIAGNOSIS — Z803 Family history of malignant neoplasm of breast: Secondary | ICD-10-CM

## 2019-05-31 DIAGNOSIS — I2692 Saddle embolus of pulmonary artery without acute cor pulmonale: Secondary | ICD-10-CM | POA: Diagnosis not present

## 2019-05-31 DIAGNOSIS — Z91128 Patient's intentional underdosing of medication regimen for other reason: Secondary | ICD-10-CM

## 2019-05-31 DIAGNOSIS — T45516A Underdosing of anticoagulants, initial encounter: Secondary | ICD-10-CM | POA: Diagnosis present

## 2019-05-31 DIAGNOSIS — I10 Essential (primary) hypertension: Secondary | ICD-10-CM | POA: Diagnosis present

## 2019-05-31 DIAGNOSIS — Z95828 Presence of other vascular implants and grafts: Secondary | ICD-10-CM

## 2019-05-31 DIAGNOSIS — D490 Neoplasm of unspecified behavior of digestive system: Secondary | ICD-10-CM | POA: Insufficient documentation

## 2019-05-31 DIAGNOSIS — I82442 Acute embolism and thrombosis of left tibial vein: Secondary | ICD-10-CM | POA: Diagnosis present

## 2019-05-31 DIAGNOSIS — C7800 Secondary malignant neoplasm of unspecified lung: Secondary | ICD-10-CM | POA: Diagnosis not present

## 2019-05-31 DIAGNOSIS — Z801 Family history of malignant neoplasm of trachea, bronchus and lung: Secondary | ICD-10-CM

## 2019-05-31 DIAGNOSIS — E876 Hypokalemia: Secondary | ICD-10-CM | POA: Diagnosis present

## 2019-05-31 DIAGNOSIS — I82412 Acute embolism and thrombosis of left femoral vein: Secondary | ICD-10-CM | POA: Diagnosis present

## 2019-05-31 DIAGNOSIS — Z9221 Personal history of antineoplastic chemotherapy: Secondary | ICD-10-CM

## 2019-05-31 HISTORY — DX: Secondary malignant neoplasm of liver and intrahepatic bile duct: C78.7

## 2019-05-31 HISTORY — DX: Secondary malignant neoplasm of unspecified lung: C78.00

## 2019-05-31 LAB — MAGNESIUM
Magnesium: 1.5 mg/dL — ABNORMAL LOW (ref 1.7–2.4)
Magnesium: 1.5 mg/dL — ABNORMAL LOW (ref 1.7–2.4)

## 2019-05-31 LAB — CBC WITH DIFFERENTIAL/PLATELET
Abs Immature Granulocytes: 0.13 10*3/uL — ABNORMAL HIGH (ref 0.00–0.07)
Basophils Absolute: 0.1 10*3/uL (ref 0.0–0.1)
Basophils Relative: 1 %
Eosinophils Absolute: 0.1 10*3/uL (ref 0.0–0.5)
Eosinophils Relative: 1 %
HCT: 33.6 % — ABNORMAL LOW (ref 39.0–52.0)
Hemoglobin: 10.5 g/dL — ABNORMAL LOW (ref 13.0–17.0)
Immature Granulocytes: 1 %
Lymphocytes Relative: 17 %
Lymphs Abs: 1.8 10*3/uL (ref 0.7–4.0)
MCH: 30.4 pg (ref 26.0–34.0)
MCHC: 31.3 g/dL (ref 30.0–36.0)
MCV: 97.4 fL (ref 80.0–100.0)
Monocytes Absolute: 1 10*3/uL (ref 0.1–1.0)
Monocytes Relative: 10 %
Neutro Abs: 7.1 10*3/uL (ref 1.7–7.7)
Neutrophils Relative %: 70 %
Platelets: 257 10*3/uL (ref 150–400)
RBC: 3.45 MIL/uL — ABNORMAL LOW (ref 4.22–5.81)
RDW: 16.3 % — ABNORMAL HIGH (ref 11.5–15.5)
WBC: 10.2 10*3/uL (ref 4.0–10.5)
nRBC: 0 % (ref 0.0–0.2)

## 2019-05-31 LAB — CBC WITH DIFFERENTIAL (CANCER CENTER ONLY)
Abs Immature Granulocytes: 0.13 10*3/uL — ABNORMAL HIGH (ref 0.00–0.07)
Basophils Absolute: 0.1 10*3/uL (ref 0.0–0.1)
Basophils Relative: 1 %
Eosinophils Absolute: 0.2 10*3/uL (ref 0.0–0.5)
Eosinophils Relative: 1 %
HCT: 35.4 % — ABNORMAL LOW (ref 39.0–52.0)
Hemoglobin: 11 g/dL — ABNORMAL LOW (ref 13.0–17.0)
Immature Granulocytes: 1 %
Lymphocytes Relative: 16 %
Lymphs Abs: 1.9 10*3/uL (ref 0.7–4.0)
MCH: 29.6 pg (ref 26.0–34.0)
MCHC: 31.1 g/dL (ref 30.0–36.0)
MCV: 95.4 fL (ref 80.0–100.0)
Monocytes Absolute: 1 10*3/uL (ref 0.1–1.0)
Monocytes Relative: 8 %
Neutro Abs: 8.8 10*3/uL — ABNORMAL HIGH (ref 1.7–7.7)
Neutrophils Relative %: 73 %
Platelet Count: 261 10*3/uL (ref 150–400)
RBC: 3.71 MIL/uL — ABNORMAL LOW (ref 4.22–5.81)
RDW: 16.1 % — ABNORMAL HIGH (ref 11.5–15.5)
WBC Count: 12 10*3/uL — ABNORMAL HIGH (ref 4.0–10.5)
nRBC: 0 % (ref 0.0–0.2)

## 2019-05-31 LAB — CMP (CANCER CENTER ONLY)
ALT: 15 U/L (ref 0–44)
AST: 18 U/L (ref 15–41)
Albumin: 2.5 g/dL — ABNORMAL LOW (ref 3.5–5.0)
Alkaline Phosphatase: 325 U/L — ABNORMAL HIGH (ref 38–126)
Anion gap: 13 (ref 5–15)
BUN: 17 mg/dL (ref 8–23)
CO2: 19 mmol/L — ABNORMAL LOW (ref 22–32)
Calcium: 8.8 mg/dL — ABNORMAL LOW (ref 8.9–10.3)
Chloride: 107 mmol/L (ref 98–111)
Creatinine: 1.15 mg/dL (ref 0.61–1.24)
GFR, Est AFR Am: >60 mL/min (ref >60)
GFR, Estimated: >60 mL/min (ref >60)
Glucose, Bld: 117 mg/dL — ABNORMAL HIGH (ref 70–99)
Potassium: 4.1 mmol/L (ref 3.5–5.1)
Sodium: 139 mmol/L (ref 135–145)
Total Bilirubin: 0.4 mg/dL (ref 0.3–1.2)
Total Protein: 6.9 g/dL (ref 6.5–8.1)

## 2019-05-31 LAB — TROPONIN I (HIGH SENSITIVITY)
Troponin I (High Sensitivity): 5 ng/L (ref <18)
Troponin I (High Sensitivity): 5 ng/L (ref <18)

## 2019-05-31 LAB — COMPREHENSIVE METABOLIC PANEL
ALT: 16 U/L (ref 0–44)
AST: 21 U/L (ref 15–41)
Albumin: 2.7 g/dL — ABNORMAL LOW (ref 3.5–5.0)
Alkaline Phosphatase: 261 U/L — ABNORMAL HIGH (ref 38–126)
Anion gap: 13 (ref 5–15)
BUN: 19 mg/dL (ref 8–23)
CO2: 18 mmol/L — ABNORMAL LOW (ref 22–32)
Calcium: 8.4 mg/dL — ABNORMAL LOW (ref 8.9–10.3)
Chloride: 107 mmol/L (ref 98–111)
Creatinine, Ser: 1.27 mg/dL — ABNORMAL HIGH (ref 0.61–1.24)
GFR calc Af Amer: >60 mL/min (ref >60)
GFR calc non Af Amer: 59 mL/min — ABNORMAL LOW (ref >60)
Glucose, Bld: 118 mg/dL — ABNORMAL HIGH (ref 70–99)
Potassium: 3.8 mmol/L (ref 3.5–5.1)
Sodium: 138 mmol/L (ref 135–145)
Total Bilirubin: 0.7 mg/dL (ref 0.3–1.2)
Total Protein: 6.8 g/dL (ref 6.5–8.1)

## 2019-05-31 LAB — TOTAL PROTEIN, URINE DIPSTICK: Protein, ur: NEGATIVE mg/dL

## 2019-05-31 LAB — PROTIME-INR
INR: 1.2 (ref 0.8–1.2)
Prothrombin Time: 15.1 seconds (ref 11.4–15.2)

## 2019-05-31 LAB — FERRITIN: Ferritin: 82 ng/mL (ref 24–336)

## 2019-05-31 LAB — CEA (IN HOUSE-CHCC): CEA (CHCC-In House): 97.13 ng/mL — ABNORMAL HIGH (ref 0.00–5.00)

## 2019-05-31 LAB — SARS CORONAVIRUS 2 BY RT PCR (HOSPITAL ORDER, PERFORMED IN ~~LOC~~ HOSPITAL LAB): SARS Coronavirus 2: NEGATIVE

## 2019-05-31 MED ORDER — ACETAMINOPHEN 650 MG RE SUPP: 650.0000 mg | Freq: Four times a day (QID) | RECTAL | Status: DC | PRN

## 2019-05-31 MED ORDER — DIPHENOXYLATE-ATROPINE 2.5-0.025 MG PO TABS
1.0000 | ORAL_TABLET | Freq: Four times a day (QID) | ORAL | Status: DC | PRN
  Administered 2019-06-01 (×2): 1 via ORAL
  Administered 2019-06-02: 2 via ORAL
  Filled 2019-05-31: qty 2
  Filled 2019-05-31 (×2): qty 1

## 2019-05-31 MED ORDER — SODIUM CHLORIDE 0.9% FLUSH
10.0000 mL | INTRAVENOUS | Status: DC | PRN
  Administered 2019-05-31: 10:00:00 10 mL
  Filled 2019-05-31: qty 10

## 2019-05-31 MED ORDER — HEPARIN SOD (PORK) LOCK FLUSH 100 UNIT/ML IV SOLN
INTRAVENOUS | Status: AC
  Filled 2019-05-31: qty 5

## 2019-05-31 MED ORDER — HEPARIN SOD (PORK) LOCK FLUSH 100 UNIT/ML IV SOLN
500.0000 [IU] | Freq: Once | INTRAVENOUS | Status: AC | PRN
  Administered 2019-05-31: 13:00:00 500 [IU]
  Filled 2019-05-31: qty 5

## 2019-05-31 MED ORDER — ENOXAPARIN SODIUM 100 MG/ML ~~LOC~~ SOLN: 100.0000 mg | Freq: Two times a day (BID) | SUBCUTANEOUS | 0 refills | Status: DC

## 2019-05-31 MED ORDER — OXYCODONE HCL 5 MG PO TABS
5.0000 mg | ORAL_TABLET | ORAL | Status: DC | PRN
  Administered 2019-06-01: 22:00:00 10 mg via ORAL
  Filled 2019-05-31: qty 2

## 2019-05-31 MED ORDER — ENOXAPARIN SODIUM 100 MG/ML ~~LOC~~ SOLN
100.0000 mg | Freq: Once | SUBCUTANEOUS | Status: AC
  Administered 2019-05-31: 100 mg via SUBCUTANEOUS
  Filled 2019-05-31: qty 1

## 2019-05-31 MED ORDER — TRAMADOL HCL 50 MG PO TABS: 50.0000 mg | ORAL_TABLET | Freq: Four times a day (QID) | ORAL | Status: DC | PRN

## 2019-05-31 MED ORDER — BINIMETINIB 15 MG PO TABS: 45.0000 mg | ORAL_TABLET | Freq: Two times a day (BID) | ORAL | Status: DC

## 2019-05-31 MED ORDER — SODIUM CHLORIDE (PF) 0.9 % IJ SOLN
INTRAMUSCULAR | Status: AC
  Filled 2019-05-31: qty 50

## 2019-05-31 MED ORDER — HEPARIN (PORCINE) 25000 UT/250ML-% IV SOLN
1550.0000 [IU]/h | INTRAVENOUS | Status: AC
  Administered 2019-05-31: 21:00:00 1600 [IU]/h via INTRAVENOUS
  Administered 2019-06-01 – 2019-06-02 (×2): 1550 [IU]/h via INTRAVENOUS
  Filled 2019-05-31 (×3): qty 250

## 2019-05-31 MED ORDER — ENCORAFENIB 75 MG PO CAPS: 300.0000 mg | ORAL_CAPSULE | Freq: Every day | ORAL | Status: DC

## 2019-05-31 MED ORDER — SODIUM CHLORIDE 0.9% FLUSH
10.0000 mL | INTRAVENOUS | Status: DC | PRN
  Administered 2019-05-31: 10 mL
  Filled 2019-05-31: qty 10

## 2019-05-31 MED ORDER — POTASSIUM CHLORIDE CRYS ER 20 MEQ PO TBCR
20.0000 meq | EXTENDED_RELEASE_TABLET | Freq: Once | ORAL | Status: AC
  Administered 2019-05-31: 20 meq via ORAL
  Filled 2019-05-31: qty 1

## 2019-05-31 MED ORDER — ACETAMINOPHEN 325 MG PO TABS: 650.0000 mg | ORAL_TABLET | Freq: Four times a day (QID) | ORAL | Status: DC | PRN

## 2019-05-31 MED ORDER — MORPHINE SULFATE ER 15 MG PO TBCR
15.0000 mg | EXTENDED_RELEASE_TABLET | Freq: Two times a day (BID) | ORAL | Status: DC | PRN
  Administered 2019-06-01: 21:00:00 15 mg via ORAL
  Filled 2019-05-31: qty 1

## 2019-05-31 MED ORDER — ENOXAPARIN SODIUM 100 MG/ML ~~LOC~~ SOLN: 100.0000 mg | Freq: Once | SUBCUTANEOUS | Status: DC

## 2019-05-31 MED ORDER — IOHEXOL 300 MG/ML  SOLN
100.0000 mL | Freq: Once | INTRAMUSCULAR | Status: AC | PRN
  Administered 2019-05-31: 100 mL via INTRAVENOUS

## 2019-05-31 MED ORDER — HEPARIN SOD (PORK) LOCK FLUSH 100 UNIT/ML IV SOLN
500.0000 [IU] | Freq: Once | INTRAVENOUS | Status: AC
  Administered 2019-05-31: 12:00:00 500 [IU] via INTRAVENOUS

## 2019-05-31 NOTE — Progress Notes (Signed)
MEDICATION RELATED CONSULT NOTE - INITIAL   Pharmacy Consult for Binimetinib and encorafenib Indication: Colorectal cancer  No Known Allergies  Patient Measurements: Height: 6\' 1"  (185.4 cm) Weight: 227 lb (103 kg) IBW/kg (Calculated) : 79.9 Adjusted Body Weight:   Vital Signs: Temp: 98.6 F (37 C) (08/26 2143) Temp Source: Oral (08/26 2143) BP: 129/102 (08/26 2143) Pulse Rate: 82 (08/26 2143) Intake/Output from previous day: No intake/output data recorded. Intake/Output from this shift: No intake/output data recorded.  Labs: Recent Labs    05/31/19 1030 05/31/19 1856  WBC 12.0* 10.2  HGB 11.0* 10.5*  HCT 35.4* 33.6*  PLT 261 257  CREATININE 1.15 1.27*  MG 1.5*  --   ALBUMIN 2.5* 2.7*  PROT 6.9 6.8  AST 18 21  ALT 15 16  ALKPHOS 325* 261*  BILITOT 0.4 0.7   Estimated Creatinine Clearance: 73.1 mL/min (A) (by C-G formula based on SCr of 1.27 mg/dL (H)).   Microbiology: Recent Results (from the past 720 hour(s))  SARS Coronavirus 2 [facility] order, Performed in [facility] hospital lab) Nasopharyngeal Nasopharyngeal Swab     Status: None   Collection Time: 05/31/19  8:17 PM   Specimen: Nasopharyngeal Swab  Result Value Ref Range Status   SARS Coronavirus 2 NEGATIVE NEGATIVE Final    Comment: (NOTE) If result is NEGATIVE SARS-CoV-2 target nucleic acids are NOT DETECTED. The SARS-CoV-2 RNA is generally detectable in upper and lower  respiratory specimens during the acute phase of infection. The lowest  concentration of SARS-CoV-2 viral copies this assay can detect is 250  copies / mL. A negative result does not preclude SARS-CoV-2 infection  and should not be used as the sole basis for treatment or other  patient management decisions.  A negative result may occur with  improper specimen collection / handling, submission of specimen other  than nasopharyngeal swab, presence of viral mutation(s) within the  areas targeted by this assay, and inadequate number  of viral copies  (<250 copies / mL). A negative result must be combined with clinical  observations, patient history, and epidemiological information. If result is POSITIVE SARS-CoV-2 target nucleic acids are DETECTED. The SARS-CoV-2 RNA is generally detectable in upper and lower  respiratory specimens dur ing the acute phase of infection.  Positive  results are indicative of active infection with SARS-CoV-2.  Clinical  correlation with patient history and other diagnostic information is  necessary to determine patient infection status.  Positive results do  not rule out bacterial infection or co-infection with other viruses. If result is PRESUMPTIVE POSTIVE SARS-CoV-2 nucleic acids MAY BE PRESENT.   A presumptive positive result was obtained on the submitted specimen  and confirmed on repeat testing.  While 2019 novel coronavirus  (SARS-CoV-2) nucleic acids may be present in the submitted sample  additional confirmatory testing may be necessary for epidemiological  and / or clinical management purposes  to differentiate between  SARS-CoV-2 and other Sarbecovirus currently known to infect humans.  If clinically indicated additional testing with an alternate test  methodology [phone]) is advised. The SARS-CoV-2 RNA is generally  detectable in upper and lower respiratory sp ecimens during the acute  phase of infection. The expected result is Negative. Fact Sheet for Patients:  StrictlyIdeas.no Fact Sheet for Healthcare Providers: BankingDealers.co.za This test is not yet approved or cleared by the [place] FDA and has been authorized for detection and/or diagnosis of SARS-CoV-2 by FDA under an Emergency Use Authorization (EUA).  This EUA will remain in effect (meaning  this test can be used) for the duration of the COVID-19 declaration under Section 564(b)(1) of the Act, 21 U.S.C. section 360bbb-3(b)(1), unless the authorization is  terminated or revoked sooner. Performed at [facility], [place] [address], [place], [place] [address]     Medical History: Past Medical History:  Diagnosis Date  . colorectal ca dx'd 07/2018  . Liver metastases ([place]) dx'd 07/2018  . Lung metastases ([place]) dx'd 07/2018    Medications:  Medications Prior to Admission  Medication Sig Dispense Refill Last Dose  . acetaminophen (TYLENOL) 325 MG tablet Take 650 mg by mouth every 6 (six) hours as needed for moderate pain.   unknown  . Binimetinib (MEKTOVI) 15 MG TABS Take 45 mg by mouth 2 (two) times a day. 180 tablet 3 05/31/2019 at 6:30am  . diphenoxylate-atropine (LOMOTIL) 2.5-0.025 MG tablet Take 1-2 tablets by mouth 4 (four) times daily as needed for diarrhea or loose stools. Up to 8 tabs daily for severe diarrhea 60 tablet 2 unknown  . doxycycline (VIBRA-TABS) 100 MG tablet TAKE 1 TABLET(100 MG) BY MOUTH TWICE DAILY (Patient taking differently: Take 100 mg by mouth 2 (two) times daily. ) 60 tablet 0 05/31/2019 at Unknown time  . Encorafenib (BRAFTOVI) 75 MG CAPS Take 300 mg by mouth daily. 120 capsule 3 05/31/2019 at 6:30am  . enoxaparin (LOVENOX) 100 MG/ML injection Inject 1 mL (100 mg total) into the skin every 12 (twelve) hours. 62 mL 0 05/31/2019 at Unknown time  . MAGNESIUM-OXIDE 400 (241.3 Mg) MG tablet TAKE 1 TABLET BY MOUTH DAILY (Patient taking differently: Take 200 mg by mouth 2 (two) times daily. ) 30 tablet 1 05/31/2019 at Unknown time  . morphine (MS CONTIN) 15 MG 12 hr tablet Take 1 tablet (15 mg total) by mouth every 12 (twelve) hours. (Patient taking differently: Take 15 mg by mouth every 12 (twelve) hours as needed for pain. ) 60 tablet 0 unknown at Unknown time  . oxyCODONE (OXY IR/ROXICODONE) 5 MG immediate release tablet Take 1-2 tablets (5-10 mg total) by mouth every 4 (four) hours as needed for severe pain. 90 tablet 0 unknown  . potassium chloride SA (K-DUR) 20 MEQ tablet Take 1 tablet (20 mEq total) by  mouth 2 (two) times daily. 60 tablet 3 05/31/2019 at Unknown time  . traMADol (ULTRAM) 50 MG tablet Take 1 tablet (50 mg total) by mouth every 6 (six) hours as needed. (Patient taking differently: Take 50 mg by mouth every 6 (six) hours as needed for moderate pain. ) 30 tablet 0 unknown  . VITAMIN E PO Take 1 tablet by mouth daily.   05/31/2019 at Unknown time  . clindamycin (CLINDAGEL) 1 % gel Apply topically 2 (two) times daily. (Patient not taking: Reported on 05/31/2019) 60 g 0 Not Taking at Unknown time  . XARELTO 15 MG TABS tablet TAKE 1 TABLET(15 MG) BY MOUTH DAILY WITH SUPPER (Patient not taking: No sig reported) 30 tablet 1 Not Taking at Unknown time    Assessment: Patient taking binimetinib and encorafenib for colorectal cancer.  Patient with new saddle PE with history of PE.  Patient also has QTc = 537 msec.  Encorafenib:   QTc prolongation (QT interval corrected per Fridericia formula or Framingham formula (QTcF) greater than 500 milliseconds (msec) and an increase from baseline of 60 msec or less) in colorectal cancer: Withhold until QTcF is 500 msec or less; resume at reduced dose (first reduction to 225 mg once daily, second reduction to 150 mg  once daily, and permanently discontinue if unable to tolerate 150 mg); permanently discontinue if more than 1 recurrence [2]   QTc = 525msec 05/31/19---will hold medication   Binimetinib:  VTE (uncomplicated DVT or pulmonary embolism (PE)): Withhold binimetinib; if improvement to Grade 0 or 1, resume at reduced dosage of 30 mg orally twice daily; permanently discontinue if no improvement or if unable to tolerate 30 mg twice daily [1]  VTE (life-threatening PE): Permanently discontinue binimetinib  Goal of Therapy:  Safe and effective use of binimetinib and encorafenib  Plan:  D/c both meds at this time and follow with Oncology plan.  Tyler Deis, Shea Stakes [place] 05/31/2019,9:55 PM

## 2019-05-31 NOTE — ED Notes (Signed)
ED TO INPATIENT HANDOFF REPORT  Name/Age/Gender Billy Mendoza 68 y.o. male  Code Status    Code Status Orders  (From admission, onward)         Start     Ordered   05/31/19 2117  Full code  Continuous     05/31/19 2119        Code Status History    Date Active Date Inactive Code Status Order ID Comments User Context   01/18/2019 2159 01/20/2019 1849 Full Code [id number]  Ivor Costa, MD ED   Advance Care Planning Activity      Home/SNF/Other Home  Chief Complaint Blood Clots in Lung  Level of Care/Admitting Diagnosis ED Disposition    ED Disposition Condition [place]: [facility] [id number]  Level of Care: Telemetry [5]  Admit to tele based on following criteria: Complex arrhythmia (Bradycardia/Tachycardia)  Covid Evaluation: Asymptomatic Screening Protocol (No Symptoms)  Diagnosis: Acute pulmonary embolism [facility]) [id number]  Admitting Physician: Shela Leff [phone]  Attending Physician: Shela Leff [phone]  PT Class (Do Not Modify): Observation [104]  PT Acc Code (Do Not Modify): Observation [10022]       Medical History Past Medical History:  Diagnosis Date  . colorectal ca dx'd 07/2018  . Liver metastases ([place]) dx'd 07/2018  . Lung metastases ([place]) dx'd 07/2018    Allergies No Known Allergies  IV Location/Drains/Wounds Patient Lines/Drains/Airways Status   Active Line/Drains/Airways    Name:   Placement date:   Placement time:   Site:   Days:   Implanted Port 08/16/18 Right Chest   08/16/18    1328    Chest   288   Peripheral IV 05/31/19 Right Forearm   05/31/19    1857    Forearm   less than 1   GI Stent    01/19/19    1351    -   132          Labs/Imaging Results for orders placed or performed during the hospital encounter of 05/31/19 (from the past 48 hour(s))  CBC with Differential/Platelet     Status: Abnormal   Collection Time: 05/31/19  6:56 PM  Result Value Ref Range   WBC 10.2  4.0 - 10.5 K/uL   RBC 3.45 (L) 4.22 - 5.81 MIL/uL   Hemoglobin 10.5 (L) 13.0 - 17.0 g/dL   HCT 33.6 (L) 39.0 - 52.0 %   MCV 97.4 80.0 - 100.0 fL   MCH 30.4 26.0 - 34.0 pg   MCHC 31.3 30.0 - 36.0 g/dL   RDW 16.3 (H) 11.5 - 15.5 %   Platelets 257 150 - 400 K/uL   nRBC 0.0 0.0 - 0.2 %   Neutrophils Relative % 70 %   Neutro Abs 7.1 1.7 - 7.7 K/uL   Lymphocytes Relative 17 %   Lymphs Abs 1.8 0.7 - 4.0 K/uL   Monocytes Relative 10 %   Monocytes Absolute 1.0 0.1 - 1.0 K/uL   Eosinophils Relative 1 %   Eosinophils Absolute 0.1 0.0 - 0.5 K/uL   Basophils Relative 1 %   Basophils Absolute 0.1 0.0 - 0.1 K/uL   Immature Granulocytes 1 %   Abs Immature Granulocytes 0.13 (H) 0.00 - 0.07 K/uL    Comment: Performed at [facility], [place] [address], [place], [place] [address]  Comprehensive metabolic panel     Status: Abnormal   Collection Time: 05/31/19  6:56 PM  Result Value Ref Range  Sodium 138 135 - 145 mmol/L   Potassium 3.8 3.5 - 5.1 mmol/L   Chloride 107 98 - 111 mmol/L   CO2 18 (L) 22 - 32 mmol/L   Glucose, Bld 118 (H) 70 - 99 mg/dL   BUN 19 8 - 23 mg/dL   Creatinine, Ser 1.27 (H) 0.61 - 1.24 mg/dL   Calcium 8.4 (L) 8.9 - 10.3 mg/dL   Total Protein 6.8 6.5 - 8.1 g/dL   Albumin 2.7 (L) 3.5 - 5.0 g/dL   AST 21 15 - 41 U/L   ALT 16 0 - 44 U/L   Alkaline Phosphatase 261 (H) 38 - 126 U/L   Total Bilirubin 0.7 0.3 - 1.2 mg/dL   GFR calc non Af Amer 59 (L) >60 mL/min   GFR calc Af Amer >60 >60 mL/min   Anion gap 13 5 - 15    Comment: Performed at [facility], [place] [address], [place], [place] [address]  Troponin I (High Sensitivity)     Status: None   Collection Time: 05/31/19  6:56 PM  Result Value Ref Range   Troponin I (High Sensitivity) 5 <18 ng/L    Comment: (NOTE) Elevated high sensitivity troponin I (hsTnI) values and significant  changes across serial measurements may suggest ACS but many other  chronic and acute conditions are known  to elevate hsTnI results.  Refer to the "Links" section for chest pain algorithms and additional  guidance. Performed at [facility], [place] [address] [place], [place] [address]   Protime-INR     Status: None   Collection Time: 05/31/19  6:56 PM  Result Value Ref Range   Prothrombin Time 15.1 11.4 - 15.2 seconds   INR 1.2 0.8 - 1.2    Comment: (NOTE) INR goal varies based on device and disease states. Performed at [facility], [place] [address], [place], [place] [address]    Ct Chest W Contrast  Result Date: 05/31/2019 CLINICAL DATA:  Colorectal cancer staging with liver and lung metastases. Nausea and vomiting. Radiation therapy complete in June. Ongoing chemotherapy. EXAM: CT CHEST, ABDOMEN, AND PELVIS WITH CONTRAST TECHNIQUE: Multidetector CT imaging of the chest, abdomen and pelvis was performed following the standard protocol during bolus administration of intravenous contrast. CONTRAST:  116mL OMNIPAQUE IOHEXOL 300 MG/ML  SOLN COMPARISON:  CT chest 02/13/2019 and CT abdomen pelvis 01/18/2019. FINDINGS: CT CHEST FINDINGS Cardiovascular: Although the study was not performed to optimize for the detection of pulmonary emboli, there is a saddle embolus at the bifurcation of the right and left pulmonary arteries, with filling defects in the lobar pulmonary arteries bilaterally. Right IJ Port-A-Cath terminates in the low SVC, near the SVC RA junction. Atherosclerotic calcification of the aorta and coronary arteries. Heart size normal. No pericardial effusion. Mediastinum/Nodes: Lymph node anterior to the proximal left pulmonary artery measures 11 mm (2/28), increased from 4 mm. No additional pathologically enlarged mediastinal, hilar or axillary lymph nodes. Esophagus is unremarkable. Lungs/Pleura: Some pulmonary nodules have improved in the interval. For example, right middle lobe nodules have nearly completely resolved (6/96 and 100). Others have enlarged.  Index nodule in the peripheral right lower lobe measures 12 mm (6/107), previously 5 mm. An 8 mm nodule in the apical left upper lobe (6/46) has also increased in size from 4 mm. No pleural fluid. Airway is unremarkable. Musculoskeletal: No worrisome lytic or sclerotic lesions. CT ABDOMEN PELVIS FINDINGS Hepatobiliary: Dominant heterogeneous low-attenuation lesion in the dome of the liver is similar  in size, measuring 3.6 x 4.0 cm (2/42), previously 2.9 x 4.3 cm. Other lesions have increased in size, however. A 3.6 cm heterogeneous lesion slightly more inferiorly within the right hepatic lobe (2/48) is larger, previously measuring 1.6 cm. Index lesion in the left hepatic lobe measures 3.1 cm (2/53), previously 1.7 cm. Gallbladder is unremarkable. No biliary ductal dilatation. Pancreas: Negative. Spleen: Negative. Adrenals/Urinary Tract: Adrenal glands are unremarkable. Low-attenuation lesions in the kidneys measure up to 3.2 cm on the left and are likely cysts. Kidneys are otherwise unremarkable. Ureters are decompressed. Bladder is unremarkable. Stomach/Bowel: Stomach, small bowel, appendix and majority of the colon are unremarkable. A metallic stent is seen in the rectosigmoid colon, where there is wall thickening and luminal narrowing. Adjacent soft tissue nodule along the left lateral margin of the rectosigmoid colon is slightly larger, measuring 1.5 cm (2/122), previously 12 mm. A fair amount of stool in the colon is indicative of constipation. Vascular/Lymphatic: Atherosclerotic calcification of the aorta without aneurysm. Periportal lymph node measures 1.1 cm (2/59), increased from 8 mm. A new low aortocaval lymph node measures 8 mm (2/79). Reproductive: Prostate is visualized. Other: Presacral edema is similar. Previously mentioned left perirectal soft tissue nodule appears slightly more prominent. There is a new nodule in the right perirectal fat, measuring 6 mm (2/116). Omental nodules in the right lateral  abdomen measure up to 1.4 cm (2/69), slightly decreased in size from 1.6 cm. Small bilateral inguinal hernias contain fat. Small umbilical hernia contains fat. Musculoskeletal: No worrisome lytic or sclerotic lesions. IMPRESSION: 1. Pulmonary emboli with a saddle embolus. Critical Value/emergent results were called by telephone at the time of interpretation on 05/31/2019 at 12:03 pm to Cira Rue, NP , who verbally acknowledged these results. 2. Slight increase in perirectal soft tissue nodularity with overall worsening in pulmonary and hepatic metastatic disease. Omental nodularity appears slightly improved. 3. New mediastinal and periportal adenopathy. Aortocaval lymph node is not enlarged by CT size criteria but is new and therefore considered suspicious for metastatic involvement. 4. Aortic atherosclerosis (ICD10-170.0). Coronary artery calcification. Electronically Signed   By: Lorin Picket M.D.   On: 05/31/2019 12:30   Ct Abdomen Pelvis W Contrast  Result Date: 05/31/2019 CLINICAL DATA:  Colorectal cancer staging with liver and lung metastases. Nausea and vomiting. Radiation therapy complete in June. Ongoing chemotherapy. EXAM: CT CHEST, ABDOMEN, AND PELVIS WITH CONTRAST TECHNIQUE: Multidetector CT imaging of the chest, abdomen and pelvis was performed following the standard protocol during bolus administration of intravenous contrast. CONTRAST:  133mL OMNIPAQUE IOHEXOL 300 MG/ML  SOLN COMPARISON:  CT chest 02/13/2019 and CT abdomen pelvis 01/18/2019. FINDINGS: CT CHEST FINDINGS Cardiovascular: Although the study was not performed to optimize for the detection of pulmonary emboli, there is a saddle embolus at the bifurcation of the right and left pulmonary arteries, with filling defects in the lobar pulmonary arteries bilaterally. Right IJ Port-A-Cath terminates in the low SVC, near the SVC RA junction. Atherosclerotic calcification of the aorta and coronary arteries. Heart size normal. No pericardial  effusion. Mediastinum/Nodes: Lymph node anterior to the proximal left pulmonary artery measures 11 mm (2/28), increased from 4 mm. No additional pathologically enlarged mediastinal, hilar or axillary lymph nodes. Esophagus is unremarkable. Lungs/Pleura: Some pulmonary nodules have improved in the interval. For example, right middle lobe nodules have nearly completely resolved (6/96 and 100). Others have enlarged. Index nodule in the peripheral right lower lobe measures 12 mm (6/107), previously 5 mm. An 8 mm nodule in the apical left upper  lobe (6/46) has also increased in size from 4 mm. No pleural fluid. Airway is unremarkable. Musculoskeletal: No worrisome lytic or sclerotic lesions. CT ABDOMEN PELVIS FINDINGS Hepatobiliary: Dominant heterogeneous low-attenuation lesion in the dome of the liver is similar in size, measuring 3.6 x 4.0 cm (2/42), previously 2.9 x 4.3 cm. Other lesions have increased in size, however. A 3.6 cm heterogeneous lesion slightly more inferiorly within the right hepatic lobe (2/48) is larger, previously measuring 1.6 cm. Index lesion in the left hepatic lobe measures 3.1 cm (2/53), previously 1.7 cm. Gallbladder is unremarkable. No biliary ductal dilatation. Pancreas: Negative. Spleen: Negative. Adrenals/Urinary Tract: Adrenal glands are unremarkable. Low-attenuation lesions in the kidneys measure up to 3.2 cm on the left and are likely cysts. Kidneys are otherwise unremarkable. Ureters are decompressed. Bladder is unremarkable. Stomach/Bowel: Stomach, small bowel, appendix and majority of the colon are unremarkable. A metallic stent is seen in the rectosigmoid colon, where there is wall thickening and luminal narrowing. Adjacent soft tissue nodule along the left lateral margin of the rectosigmoid colon is slightly larger, measuring 1.5 cm (2/122), previously 12 mm. A fair amount of stool in the colon is indicative of constipation. Vascular/Lymphatic: Atherosclerotic calcification of the  aorta without aneurysm. Periportal lymph node measures 1.1 cm (2/59), increased from 8 mm. A new low aortocaval lymph node measures 8 mm (2/79). Reproductive: Prostate is visualized. Other: Presacral edema is similar. Previously mentioned left perirectal soft tissue nodule appears slightly more prominent. There is a new nodule in the right perirectal fat, measuring 6 mm (2/116). Omental nodules in the right lateral abdomen measure up to 1.4 cm (2/69), slightly decreased in size from 1.6 cm. Small bilateral inguinal hernias contain fat. Small umbilical hernia contains fat. Musculoskeletal: No worrisome lytic or sclerotic lesions. IMPRESSION: 1. Pulmonary emboli with a saddle embolus. Critical Value/emergent results were called by telephone at the time of interpretation on 05/31/2019 at 12:03 pm to Cira Rue, NP , who verbally acknowledged these results. 2. Slight increase in perirectal soft tissue nodularity with overall worsening in pulmonary and hepatic metastatic disease. Omental nodularity appears slightly improved. 3. New mediastinal and periportal adenopathy. Aortocaval lymph node is not enlarged by CT size criteria but is new and therefore considered suspicious for metastatic involvement. 4. Aortic atherosclerosis (ICD10-170.0). Coronary artery calcification. Electronically Signed   By: Lorin Picket M.D.   On: 05/31/2019 12:30    Pending Labs Unresulted Labs (From admission, onward)    Start     Ordered   05/31/19 2120  Occult blood card to lab, stool  Once,   STAT     05/31/19 2119   05/31/19 2119  Magnesium  Add-on,   AD     05/31/19 2119   05/31/19 1856  SARS Coronavirus 2 [facility] order, Performed in [facility] hospital lab) Nasopharyngeal Nasopharyngeal Swab  (Symptomatic/High Risk of Exposure/Tier 1 Patients Labs with Precautions)  Once,   STAT    Question Answer Comment  Is this test for diagnosis or screening Diagnosis of ill patient   Symptomatic for COVID-19 as defined by CDC No    Hospitalized for COVID-19 No   Admitted to ICU for COVID-19 No   Previously tested for COVID-19 No   Resident in a congregate (group) care setting No   Employed in healthcare setting No      05/31/19 1855          Vitals/Pain Today's Vitals   05/31/19 1840 05/31/19 1952 05/31/19 2000 05/31/19 2030  BP: 108/87 121/86  119/88 (!) 124/93  Pulse: (!) 108 91 91 87  Resp:  (!) 24 17 20   Temp:      TempSrc:      SpO2: 95% 98% 98% 98%  Weight:      Height:      PainSc:        Isolation Precautions Airborne and Contact precautions  Medications Medications  heparin ADULT infusion 100 units/mL (25000 units/226mL sodium chloride 0.45%) (1,600 Units/hr Intravenous New Bag/Given 05/31/19 2055)  morphine (MS CONTIN) 12 hr tablet 15 mg (has no administration in time range)  oxyCODONE (Oxy IR/ROXICODONE) immediate release tablet 5-10 mg (has no administration in time range)  traMADol (ULTRAM) tablet 50 mg (has no administration in time range)  Binimetinib TABS 45 mg (has no administration in time range)  encorafenib (BRAFTOVI) capsule 300 mg (has no administration in time range)  diphenoxylate-atropine (LOMOTIL) 2.5-0.025 MG per tablet 1-2 tablet (has no administration in time range)  acetaminophen (TYLENOL) tablet 650 mg (has no administration in time range)    Or  acetaminophen (TYLENOL) suppository 650 mg (has no administration in time range)  potassium chloride SA (K-DUR) CR tablet 20 mEq (has no administration in time range)    Mobility walks

## 2019-05-31 NOTE — Progress Notes (Addendum)
[facility]pt[facility] Progress Note   Zabien Cuthbertson [id number] 09-May-1951 68 y.o.  Billy Mendoza is managed by [name]  Actively treated with chemotherapy/immunotherapy/hormonal therapy: yes  Current therapy: BRAFTOVI(encorafenib)300mg  dailyand MEKTOVI(binimetinib) 45mg  q12hand Vectibix q2weeks   Last treated:  05/19/2019 (cycle 8, day1)  Next scheduled appointment with provider: 06/01/2019  Assessment: Plan:    PE (pulmonary thromboembolism) ([place]) - Plan: enoxaparin (LOVENOX) injection 100 mg, enoxaparin (LOVENOX) 100 MG/ML injection, enoxaparin (LOVENOX) injection 100 mg, DISCONTINUED: enoxaparin (LOVENOX) injection 100 mg  Port-A-Cath in place - Plan: heparin lock flush 100 unit/mL, sodium chloride flush (NS) 0.9 % injection 10 mL  Rectal cancer metastasized to liver [facility])   PE with saddle embolus: The patient was offered either admission or home management.  He elected to manage his new saddle embolus at home with Lovenox.  He was diagnosed with Lovenox 100 mg [place] at 13:00 today.  We will repeat dosing at midnight tonight.  He will return tomorrow and will be dosed with 100 mg [place] at 11 AM.  He will advance his dosing of Lovenox by 1 hour daily until he is dosing at 8 AM and 8 PM.  Prescription for a 31-day supply was sent to his pharmacy. Dr. Burr Medico addressed the potential side effects of bleeding.  She reported that it was approximately a 50% chance of this occurring.  She also reported that it was a small chance of sudden death due to the saddle embolus.  Metastatic rectal cancer: The patient continues on treatment with BRAFTOVI(encorafenib)300mg  dailyand MEKTOVI(binimetinib) 45mg  q12hand Vectibix q2weeks.  He will see Dr. Burr Medico tomorrow.  She will discuss the possibility of changing to a different regiment given his scans from today which showed disease progression.  Please see After Visit Summary for patient specific instructions.  Future Appointments  Date Time  Provider [place]  06/01/2019 10:45 AM Truitt Merle, MD [facility] None  06/01/2019 11:30 AM [facility] INFUSION [facility] None  06/15/2019 12:45 PM [facility] LAB 6 [facility] None  06/15/2019  1:00 PM [place] [facility] None  06/15/2019  1:20 PM Truitt Merle, MD [facility] None  06/15/2019  3:00 PM [facility] INFUSION [facility] None  06/29/2019 12:15 PM [facility] ONLY [facility] None  06/29/2019 12:30 PM [facility] [place] None  06/29/2019  1:00 PM Truitt Merle, MD [facility] None  06/29/2019  1:30 PM [facility] INFUSION [facility] None    No orders of the defined types were placed in this encounter.      Subjective:   Patient ID:  Billy Mendoza is a 68 y.o. (DOB Jul 16, 1951) male.  Chief Complaint:  Chief Complaint  Patient presents with   Results    CT scan    HPI Billy Mendoza   is a 68 year old male with a history of a metastatic rectal cancer who is managed by Dr. Burr Medico and who has most recently been treated with BRAFTOVI(encorafenib)300mg  dailyand MEKTOVI(binimetinib) 45mg  q12hand Vectibix q2weeks.  He has a history of a pulmonary emboli and status been prescribed Xarelto.  He has most recently been on 15 mg daily.  He has a history of a colonic stent which causes episodic bleeding which is intensified with his use of Xarelto.  He reports that he has not been taking Xarelto for some time due to his propensity for rectal bleeding.  He had a CT scan completed today which showed a new saddle embolus.  He reports ongoing fatigue but is had no shortness of breath over his baseline and has had  no chest pain.  Additionally his CT scan showed:  1. Pulmonary emboli with a saddle embolus. Critical Value/emergent results were called by telephone at the time of interpretation on 05/31/2019 at 12:03 pm to Cira Rue, NP , who verbally acknowledged these results. 2. Slight increase in perirectal soft tissue nodularity with overall worsening in pulmonary and  hepatic metastatic disease. Omental nodularity appears slightly improved. 3. New mediastinal and periportal adenopathy. Aortocaval lymph node is not enlarged by CT size criteria but is new and therefore considered suspicious for metastatic involvement. 4. Aortic atherosclerosis (ICD10-170.0). Coronary artery calcification.  Medications: I have reviewed the patient's current medications.  Allergies: No Known Allergies  Past Medical History:  Diagnosis Date   colorectal ca dx'd 07/2018   Liver metastases ([place]) dx'd 07/2018   Lung metastases ([place]) dx'd 07/2018    Past Surgical History:  Procedure Laterality Date   COLONIC STENT PLACEMENT N/A 01/19/2019   Procedure: COLONIC STENT PLACEMENT;  Surgeon: Carol Ada, MD;  Location: [facility];  Service: Endoscopy;  Laterality: N/A;   FLEXIBLE SIGMOIDOSCOPY N/A 01/19/2019   Procedure: FLEXIBLE SIGMOIDOSCOPY;  Surgeon: Carol Ada, MD;  Location: [facility];  Service: Endoscopy;  Laterality: N/A;   IR IMAGING GUIDED PORT INSERTION  08/16/2018   IR US GUIDE BX ASP/DRAIN  08/16/2018    Family History  Problem Relation Age of Onset   Cancer Mother 45       breast cancer   Atrial fibrillation Father    Heart failure Father    Cancer Maternal Grandmother        lung cancer     Social History   Socioeconomic History   Marital status: Married    Spouse name: Not on file   Number of children: Not on file   Years of education: Not on file   Highest education level: Not on file  Occupational History   Not on file  Social Needs   Financial resource strain: Not on file   Food insecurity    Worry: Not on file    Inability: Not on file   Transportation needs    Medical: No    Non-medical: No  Tobacco Use   Smoking status: Former Smoker    Years: 10.00    Types: Cigarettes, Cigars   Smokeless tobacco: Never Used   Tobacco comment: quit in Sep 2019   Substance and Sexual Activity   Alcohol use: Yes     Alcohol/week: 1.0 - 2.0 standard drinks    Types: 1 - 2 Shots of liquor per week   Drug use: Never   Sexual activity: Not on file  Lifestyle   Physical activity    Days per week: Not on file    Minutes per session: Not on file   Stress: Not on file  Relationships   Social connections    Talks on phone: Not on file    Gets together: Not on file    Attends religious service: Not on file    Active member of club or organization: Not on file    Attends meetings of clubs or organizations: Not on file    Relationship status: Not on file   Intimate partner violence    Fear of current or ex partner: Not on file    Emotionally abused: Not on file    Physically abused: Not on file    Forced sexual activity: Not on file  Other Topics Concern   Not on file  Social History Narrative  Not on file    Past Medical History, Surgical history, Social history, and Family history were reviewed and updated as appropriate.   Please see review of systems for further details on the patient's review from today.   Review of Systems:  Review of Systems  Constitutional: Positive for fatigue. Negative for chills, diaphoresis and fever.  HENT: Negative for congestion, postnasal drip, rhinorrhea and sore throat.   Respiratory: Negative for cough, chest tightness, shortness of breath and wheezing.   Cardiovascular: Negative for chest pain and palpitations.  Gastrointestinal: Positive for anal bleeding.  Neurological: Negative for headaches.    Objective:   Physical Exam:  BP (!) 138/103    Pulse (!) 138    Temp 98 F (36.7 C)    Resp 20    SpO2 96%  ECOG: 1  Oxygen saturation at rest:  97% on room air Pulse at rest:    100 BPM  Oxygen saturation with ambulation:  96% on room air Pulse with ambulation:  138 BPM  Physical Exam Constitutional:      General: He is not in acute distress.    Appearance: He is not diaphoretic.  HENT:     Head: Normocephalic and atraumatic.  Eyes:      General: No scleral icterus.       Right eye: No discharge.        Left eye: No discharge.     Conjunctiva/sclera: Conjunctivae normal.  Cardiovascular:     Rate and Rhythm: Regular rhythm. Tachycardia present.     Heart sounds: Normal heart sounds. No murmur. No friction rub. No gallop.   Pulmonary:     Effort: Pulmonary effort is normal. No respiratory distress.     Breath sounds: Normal breath sounds. No wheezing or rales.  Skin:    General: Skin is warm and dry.     Findings: No erythema or rash.  Neurological:     Mental Status: He is alert.     Coordination: Coordination normal.     Gait: Gait normal.  Psychiatric:        Mood and Affect: Mood normal.        Behavior: Behavior normal.        Thought Content: Thought content normal.        Judgment: Judgment normal.     Lab Review:     Component Value Date/Time   NA 139 05/31/2019 1030   K 4.1 05/31/2019 1030   CL 107 05/31/2019 1030   CO2 19 (L) 05/31/2019 1030   GLUCOSE 117 (H) 05/31/2019 1030   BUN 17 05/31/2019 1030   CREATININE 1.15 05/31/2019 1030   CALCIUM 8.8 (L) 05/31/2019 1030   PROT 6.9 05/31/2019 1030   ALBUMIN 2.5 (L) 05/31/2019 1030   AST 18 05/31/2019 1030   ALT 15 05/31/2019 1030   ALKPHOS 325 (H) 05/31/2019 1030   BILITOT 0.4 05/31/2019 1030   GFRNONAA >60 05/31/2019 1030   GFRAA >60 05/31/2019 1030       Component Value Date/Time   WBC 12.0 (H) 05/31/2019 1030   WBC 4.8 01/20/2019 0345   RBC 3.71 (L) 05/31/2019 1030   HGB 11.0 (L) 05/31/2019 1030   HCT 35.4 (L) 05/31/2019 1030   PLT 261 05/31/2019 1030   MCV 95.4 05/31/2019 1030   MCH 29.6 05/31/2019 1030   MCHC 31.1 05/31/2019 1030   RDW 16.1 (H) 05/31/2019 1030   LYMPHSABS 1.9 05/31/2019 1030   MONOABS 1.0 05/31/2019 1030   EOSABS 0.2  05/31/2019 1030   BASOSABS 0.1 05/31/2019 1030   -------------------------------  Imaging from last 24 hours (if applicable):  Radiology interpretation: Ct Chest W Contrast  Result Date:  05/31/2019 CLINICAL DATA:  Colorectal cancer staging with liver and lung metastases. Nausea and vomiting. Radiation therapy complete in June. Ongoing chemotherapy. EXAM: CT CHEST, ABDOMEN, AND PELVIS WITH CONTRAST TECHNIQUE: Multidetector CT imaging of the chest, abdomen and pelvis was performed following the standard protocol during bolus administration of intravenous contrast. CONTRAST:  178mL OMNIPAQUE IOHEXOL 300 MG/ML  SOLN COMPARISON:  CT chest 02/13/2019 and CT abdomen pelvis 01/18/2019. FINDINGS: CT CHEST FINDINGS Cardiovascular: Although the study was not performed to optimize for the detection of pulmonary emboli, there is a saddle embolus at the bifurcation of the right and left pulmonary arteries, with filling defects in the lobar pulmonary arteries bilaterally. Right IJ Port-A-Cath terminates in the low SVC, near the SVC RA junction. Atherosclerotic calcification of the aorta and coronary arteries. Heart size normal. No pericardial effusion. Mediastinum/Nodes: Lymph node anterior to the proximal left pulmonary artery measures 11 mm (2/28), increased from 4 mm. No additional pathologically enlarged mediastinal, hilar or axillary lymph nodes. Esophagus is unremarkable. Lungs/Pleura: Some pulmonary nodules have improved in the interval. For example, right middle lobe nodules have nearly completely resolved (6/96 and 100). Others have enlarged. Index nodule in the peripheral right lower lobe measures 12 mm (6/107), previously 5 mm. An 8 mm nodule in the apical left upper lobe (6/46) has also increased in size from 4 mm. No pleural fluid. Airway is unremarkable. Musculoskeletal: No worrisome lytic or sclerotic lesions. CT ABDOMEN PELVIS FINDINGS Hepatobiliary: Dominant heterogeneous low-attenuation lesion in the dome of the liver is similar in size, measuring 3.6 x 4.0 cm (2/42), previously 2.9 x 4.3 cm. Other lesions have increased in size, however. A 3.6 cm heterogeneous lesion slightly more inferiorly  within the right hepatic lobe (2/48) is larger, previously measuring 1.6 cm. Index lesion in the left hepatic lobe measures 3.1 cm (2/53), previously 1.7 cm. Gallbladder is unremarkable. No biliary ductal dilatation. Pancreas: Negative. Spleen: Negative. Adrenals/Urinary Tract: Adrenal glands are unremarkable. Low-attenuation lesions in the kidneys measure up to 3.2 cm on the left and are likely cysts. Kidneys are otherwise unremarkable. Ureters are decompressed. Bladder is unremarkable. Stomach/Bowel: Stomach, small bowel, appendix and majority of the colon are unremarkable. A metallic stent is seen in the rectosigmoid colon, where there is wall thickening and luminal narrowing. Adjacent soft tissue nodule along the left lateral margin of the rectosigmoid colon is slightly larger, measuring 1.5 cm (2/122), previously 12 mm. A fair amount of stool in the colon is indicative of constipation. Vascular/Lymphatic: Atherosclerotic calcification of the aorta without aneurysm. Periportal lymph node measures 1.1 cm (2/59), increased from 8 mm. A new low aortocaval lymph node measures 8 mm (2/79). Reproductive: Prostate is visualized. Other: Presacral edema is similar. Previously mentioned left perirectal soft tissue nodule appears slightly more prominent. There is a new nodule in the right perirectal fat, measuring 6 mm (2/116). Omental nodules in the right lateral abdomen measure up to 1.4 cm (2/69), slightly decreased in size from 1.6 cm. Small bilateral inguinal hernias contain fat. Small umbilical hernia contains fat. Musculoskeletal: No worrisome lytic or sclerotic lesions. IMPRESSION: 1. Pulmonary emboli with a saddle embolus. Critical Value/emergent results were called by telephone at the time of interpretation on 05/31/2019 at 12:03 pm to Cira Rue, NP , who verbally acknowledged these results. 2. Slight increase in perirectal soft tissue nodularity with overall  worsening in pulmonary and hepatic metastatic  disease. Omental nodularity appears slightly improved. 3. New mediastinal and periportal adenopathy. Aortocaval lymph node is not enlarged by CT size criteria but is new and therefore considered suspicious for metastatic involvement. 4. Aortic atherosclerosis (ICD10-170.0). Coronary artery calcification. Electronically Signed   By: Lorin Picket M.D.   On: 05/31/2019 12:30   Ct Abdomen Pelvis W Contrast  Result Date: 05/31/2019 CLINICAL DATA:  Colorectal cancer staging with liver and lung metastases. Nausea and vomiting. Radiation therapy complete in June. Ongoing chemotherapy. EXAM: CT CHEST, ABDOMEN, AND PELVIS WITH CONTRAST TECHNIQUE: Multidetector CT imaging of the chest, abdomen and pelvis was performed following the standard protocol during bolus administration of intravenous contrast. CONTRAST:  115mL OMNIPAQUE IOHEXOL 300 MG/ML  SOLN COMPARISON:  CT chest 02/13/2019 and CT abdomen pelvis 01/18/2019. FINDINGS: CT CHEST FINDINGS Cardiovascular: Although the study was not performed to optimize for the detection of pulmonary emboli, there is a saddle embolus at the bifurcation of the right and left pulmonary arteries, with filling defects in the lobar pulmonary arteries bilaterally. Right IJ Port-A-Cath terminates in the low SVC, near the SVC RA junction. Atherosclerotic calcification of the aorta and coronary arteries. Heart size normal. No pericardial effusion. Mediastinum/Nodes: Lymph node anterior to the proximal left pulmonary artery measures 11 mm (2/28), increased from 4 mm. No additional pathologically enlarged mediastinal, hilar or axillary lymph nodes. Esophagus is unremarkable. Lungs/Pleura: Some pulmonary nodules have improved in the interval. For example, right middle lobe nodules have nearly completely resolved (6/96 and 100). Others have enlarged. Index nodule in the peripheral right lower lobe measures 12 mm (6/107), previously 5 mm. An 8 mm nodule in the apical left upper lobe (6/46) has  also increased in size from 4 mm. No pleural fluid. Airway is unremarkable. Musculoskeletal: No worrisome lytic or sclerotic lesions. CT ABDOMEN PELVIS FINDINGS Hepatobiliary: Dominant heterogeneous low-attenuation lesion in the dome of the liver is similar in size, measuring 3.6 x 4.0 cm (2/42), previously 2.9 x 4.3 cm. Other lesions have increased in size, however. A 3.6 cm heterogeneous lesion slightly more inferiorly within the right hepatic lobe (2/48) is larger, previously measuring 1.6 cm. Index lesion in the left hepatic lobe measures 3.1 cm (2/53), previously 1.7 cm. Gallbladder is unremarkable. No biliary ductal dilatation. Pancreas: Negative. Spleen: Negative. Adrenals/Urinary Tract: Adrenal glands are unremarkable. Low-attenuation lesions in the kidneys measure up to 3.2 cm on the left and are likely cysts. Kidneys are otherwise unremarkable. Ureters are decompressed. Bladder is unremarkable. Stomach/Bowel: Stomach, small bowel, appendix and majority of the colon are unremarkable. A metallic stent is seen in the rectosigmoid colon, where there is wall thickening and luminal narrowing. Adjacent soft tissue nodule along the left lateral margin of the rectosigmoid colon is slightly larger, measuring 1.5 cm (2/122), previously 12 mm. A fair amount of stool in the colon is indicative of constipation. Vascular/Lymphatic: Atherosclerotic calcification of the aorta without aneurysm. Periportal lymph node measures 1.1 cm (2/59), increased from 8 mm. A new low aortocaval lymph node measures 8 mm (2/79). Reproductive: Prostate is visualized. Other: Presacral edema is similar. Previously mentioned left perirectal soft tissue nodule appears slightly more prominent. There is a new nodule in the right perirectal fat, measuring 6 mm (2/116). Omental nodules in the right lateral abdomen measure up to 1.4 cm (2/69), slightly decreased in size from 1.6 cm. Small bilateral inguinal hernias contain fat. Small umbilical  hernia contains fat. Musculoskeletal: No worrisome lytic or sclerotic lesions. IMPRESSION: 1. Pulmonary emboli  with a saddle embolus. Critical Value/emergent results were called by telephone at the time of interpretation on 05/31/2019 at 12:03 pm to Cira Rue, NP , who verbally acknowledged these results. 2. Slight increase in perirectal soft tissue nodularity with overall worsening in pulmonary and hepatic metastatic disease. Omental nodularity appears slightly improved. 3. New mediastinal and periportal adenopathy. Aortocaval lymph node is not enlarged by CT size criteria but is new and therefore considered suspicious for metastatic involvement. 4. Aortic atherosclerosis (ICD10-170.0). Coronary artery calcification. Electronically Signed   By: Lorin Picket M.D.   On: 05/31/2019 12:30        This patient was seen with Dr. Burr Medico with my treatment plan reviewed with her. She expressed agreement with my medical management of this patient.   Addendum  I have seen the patient, examined him. I agree with the assessment and and plan and have edited the notes.   I personally reviewed his CT scan images with patient and discussed with the findings.  He has developed saddle PE, with significant thrombosis burden, also his vital sign is stable, did not desat with 5 mins walking. I recommend hospital admission for anticoagulation and close monitoring due to the risk of sudden death from saddle PE.  He is at high risk for rectal bleeding due to the rectal cancer.  However patient was very reluctant to be admitted to hospital, then we discussed the alternative therapy with Lovenox 1 mg/kg every 12 hours at home, with close monitoring.  His wife is a Marine scientist.  If he developed worsening shortness of breath, chest pain, low oxygen level, or significant rectal bleeding, he should go to [facility] long ED immediately.  He voiced good understanding, and agrees to follow.  He received first dose Lovenox 100 mg in our clinic  around 1 PM today. He will return tomorrow for close f/u and chemo.   We also reviewed his CT about his cancer status, unfortunately he has disease progression in the liver. I will change his treatment back to [facility] and Vectibix. Due to his recurrent PE and rectal bleeding, he is not a candidate for Avastin.  I spoke with his wife around 5:30pm today and discussed the above.  She is not available to be off work and monitor him at home, she strongly referred him to be admitted to hospital. She spoke with him, and he agreed. I informed [facility] ED charge nurse about his coming to ED to be admitted. I will f/u him in the hospital tomorrow.   I spent a total of 40 minutes in coordinating his care today, more than 50% time on face-to-face counseling.   Truitt Merle  05/31/2019 5:51 PM

## 2019-05-31 NOTE — Progress Notes (Signed)
ANTICOAGULATION CONSULT NOTE - Initial Consult  Pharmacy Consult for Heparin Indication: pulmonary embolus  No Known Allergies  Patient Measurements: Height: 6\' 1"  (185.4 cm) Weight: 224 lb (101.6 kg) IBW/kg (Calculated) : 79.9 HEPARIN DW (KG): 100.4   Vital Signs: Temp: 99.2 F (37.3 C) (08/26 1838) Temp Source: Oral (08/26 1838) BP: 121/86 (08/26 1952) Pulse Rate: 91 (08/26 1952)  Labs: Recent Labs    05/31/19 1030 05/31/19 1856  HGB 11.0* 10.5*  HCT 35.4* 33.6*  PLT 261 257  LABPROT  --  15.1  INR  --  1.2  CREATININE 1.15 1.27*  TROPONINIHS  --  5    Estimated Creatinine Clearance: 72.7 mL/min (A) (by C-G formula based on SCr of 1.27 mg/dL (H)).   Medical History: Past Medical History:  Diagnosis Date  . colorectal ca dx'd 07/2018  . Liver metastases ([place]) dx'd 07/2018  . Lung metastases ([place]) dx'd 07/2018    Medications:  Infusions:  Started on Lovenox 100mg  SQ BID at [facility] visit 8/26- only received x1 dose at 1300   Assessment: 68 yo M with metastatic rectal CA diagnosed with saddle PE on CT at oncology visit.  He refused hospital admission so was started on Lovenox SQ BID.  He returned to ED this afternoon.  Plan to admit for close monitoring.  Pharmacy consulted to start IV heparin. No bleeding noted.  CBC- Hg slightly low but stable.  Pltc WNL. Baseline lNR 1.2. Noted increased risk of bleeding due to rectal CA.  He has hx PE for which he was taking Xarelto.  He had held this recently due to bleeding.   Goal of Therapy:  Heparin level 0.3-0.7 units/ml Monitor platelets by anticoagulation protocol: Yes   Plan:   No bolus since received Lovenox injection ~ 8h ago  Heparin infusion at 1600 units/hr  Check 8h heparin level  Daily heparin level & CBC while on heparin  Biagio Borg 05/31/2019,8:09 PM

## 2019-05-31 NOTE — Patient Instructions (Signed)

## 2019-05-31 NOTE — H&P (Signed)
History and Physical    Billy Mendoza [id number] DOB: 12-May-1951 DOA: 05/31/2019  PCP: Merry Proud, MD Patient coming from: Office  Chief Complaint: PE  HPI: Billy Mendoza is a 68 y.o. male with medical history significant of metastatic rectal cancer being sent to the hospital by oncology Dr. Burr Medico for management of a saddle PE found on CT done today.  Patient was given Lovenox 100 mg subcutaneous at 1300 today.  Patient reports 2-week history of slight dyspnea on exertion.  Denies any fevers, chills, chest pain, or cough.  States he had a blood clot previously for which she is supposed to be on Xarelto but has not been compliant with this medication due to episodic bleeding from his colonic stent which is worse when he uses Xarelto.  Denies any abdominal pain.  Denies noticing any calf swelling, erythema, or tenderness.  No other complaints.  ED Course: Slightly tachycardic on arrival and tachypneic.  SPO2 95 to 98% on room air.  High-sensitivity troponin negative.  INR 1.2.  COVID-19 test pending.  CT showing PE with a saddle embolus. Started on Heparin infusion.   Review of Systems:  All systems reviewed and apart from history of presenting illness, are negative.  Past Medical History:  Diagnosis Date   colorectal ca dx'd 07/2018   Liver metastases ([place]) dx'd 07/2018   Lung metastases ([place]) dx'd 07/2018    Past Surgical History:  Procedure Laterality Date   COLONIC STENT PLACEMENT N/A 01/19/2019   Procedure: COLONIC STENT PLACEMENT;  Surgeon: Carol Ada, MD;  Location: [facility];  Service: Endoscopy;  Laterality: N/A;   FLEXIBLE SIGMOIDOSCOPY N/A 01/19/2019   Procedure: FLEXIBLE SIGMOIDOSCOPY;  Surgeon: Carol Ada, MD;  Location: [facility];  Service: Endoscopy;  Laterality: N/A;   IR IMAGING GUIDED PORT INSERTION  08/16/2018   IR US GUIDE BX ASP/DRAIN  08/16/2018     reports that he has quit smoking. His smoking use included cigarettes and cigars. He quit  after 10.00 years of use. He has never used smokeless tobacco. He reports current alcohol use of about 1.0 - 2.0 standard drinks of alcohol per week. He reports that he does not use drugs.  No Known Allergies  Family History  Problem Relation Age of Onset   Cancer Mother 5       breast cancer   Atrial fibrillation Father    Heart failure Father    Cancer Maternal Grandmother        lung cancer     Prior to Admission medications   Medication Sig Start Date End Date Taking? Authorizing Provider  acetaminophen (TYLENOL) 325 MG tablet Take 650 mg by mouth every 6 (six) hours as needed for moderate pain.   Yes [provider]  Binimetinib (MEKTOVI) 15 MG TABS Take 45 mg by mouth 2 (two) times a day. 01/25/19  Yes Truitt Merle, MD  diphenoxylate-atropine (LOMOTIL) 2.5-0.025 MG tablet Take 1-2 tablets by mouth 4 (four) times daily as needed for diarrhea or loose stools. Up to 8 tabs daily for severe diarrhea 09/07/18  Yes Truitt Merle, MD  doxycycline (VIBRA-TABS) 100 MG tablet TAKE 1 TABLET(100 MG) BY MOUTH TWICE DAILY Patient taking differently: Take 100 mg by mouth 2 (two) times daily.  05/15/19  Yes Alla Feeling, NP  Encorafenib (BRAFTOVI) 75 MG CAPS Take 300 mg by mouth daily. 01/25/19  Yes Truitt Merle, MD  enoxaparin (LOVENOX) 100 MG/ML injection Inject 1 mL (100 mg total) into the skin every 12 (twelve) hours. 05/31/19  07/01/19 Yes Tanner, Lyndon Code., PA-C  MAGNESIUM-OXIDE 400 (241.3 Mg) MG tablet TAKE 1 TABLET BY MOUTH DAILY Patient taking differently: Take 200 mg by mouth 2 (two) times daily.  05/15/19  Yes Alla Feeling, NP  morphine (MS CONTIN) 15 MG 12 hr tablet Take 1 tablet (15 mg total) by mouth every 12 (twelve) hours. Patient taking differently: Take 15 mg by mouth every 12 (twelve) hours as needed for pain.  04/03/19  Yes Alla Feeling, NP  oxyCODONE (OXY IR/ROXICODONE) 5 MG immediate release tablet Take 1-2 tablets (5-10 mg total) by mouth every 4 (four) hours as needed for  severe pain. 03/16/19  Yes Truitt Merle, MD  potassium chloride SA (K-DUR) 20 MEQ tablet Take 1 tablet (20 mEq total) by mouth 2 (two) times daily. 02/20/19  Yes Truitt Merle, MD  traMADol (ULTRAM) 50 MG tablet Take 1 tablet (50 mg total) by mouth every 6 (six) hours as needed. Patient taking differently: Take 50 mg by mouth every 6 (six) hours as needed for moderate pain.  03/09/19  Yes Alla Feeling, NP  VITAMIN E PO Take 1 tablet by mouth daily.   Yes [provider]  clindamycin (CLINDAGEL) 1 % gel Apply topically 2 (two) times daily. Patient not taking: Reported on 05/31/2019 02/01/19   Truitt Merle, MD  XARELTO 15 MG TABS tablet TAKE 1 TABLET(15 MG) BY MOUTH DAILY WITH SUPPER Patient not taking: No sig reported 04/13/19   Alla Feeling, NP  prochlorperazine (COMPAZINE) 10 MG tablet Take 1 tablet (10 mg total) by mouth every 6 (six) hours as needed (Nausea or vomiting). Patient not taking: Reported on 08/30/2018 08/04/18 09/01/18  Truitt Merle, MD    Physical Exam: Vitals:   05/31/19 1840 05/31/19 1952 05/31/19 2000 05/31/19 2030  BP: 108/87 121/86 119/88 (!) 124/93  Pulse: (!) 108 91 91 87  Resp:  (!) 24 17 20   Temp:      TempSrc:      SpO2: 95% 98% 98% 98%  Weight:      Height:        Physical Exam  Constitutional: He is oriented to person, place, and time. He appears well-developed and well-nourished. No distress.  HENT:  Head: Normocephalic.  Mouth/Throat: Oropharynx is clear and moist.  Eyes: Right eye exhibits no discharge. Left eye exhibits no discharge.  Neck: Neck supple.  Cardiovascular: Normal rate, regular rhythm and intact distal pulses.  Pulmonary/Chest: Effort normal and breath sounds normal. No respiratory distress. He has no wheezes. He has no rales.  Abdominal: Soft. Bowel sounds are normal. He exhibits no distension. There is no abdominal tenderness. There is no guarding.  Musculoskeletal:        General: No edema.  Neurological: He is alert and oriented to  person, place, and time.  Skin: Skin is warm and dry. He is not diaphoretic.     Labs on Admission: I have personally reviewed following labs and imaging studies  CBC: Recent Labs  Lab 05/31/19 1030 05/31/19 1856  WBC 12.0* 10.2  NEUTROABS 8.8* 7.1  HGB 11.0* 10.5*  HCT 35.4* 33.6*  MCV 95.4 97.4  PLT 261 [id number]   Basic Metabolic Panel: Recent Labs  Lab 05/31/19 1030 05/31/19 1856  NA 139 138  K 4.1 3.8  CL 107 107  CO2 19* 18*  GLUCOSE 117* 118*  BUN 17 19  CREATININE 1.15 1.27*  CALCIUM 8.8* 8.4*  MG 1.5*  --    GFR: Estimated Creatinine Clearance:  72.7 mL/min (A) (by C-G formula based on SCr of 1.27 mg/dL (H)). Liver Function Tests: Recent Labs  Lab 05/31/19 1030 05/31/19 1856  AST 18 21  ALT 15 16  ALKPHOS 325* 261*  BILITOT 0.4 0.7  PROT 6.9 6.8  ALBUMIN 2.5* 2.7*   No results for input(s): LIPASE, AMYLASE in the last 168 hours. No results for input(s): AMMONIA in the last 168 hours. Coagulation Profile: Recent Labs  Lab 05/31/19 1856  INR 1.2   Cardiac Enzymes: No results for input(s): CKTOTAL, CKMB, CKMBINDEX, TROPONINI in the last 168 hours. BNP (last 3 results) No results for input(s): PROBNP in the last 8760 hours. HbA1C: No results for input(s): HGBA1C in the last 72 hours. CBG: No results for input(s): GLUCAP in the last 168 hours. Lipid Profile: No results for input(s): CHOL, HDL, LDLCALC, TRIG, CHOLHDL, LDLDIRECT in the last 72 hours. Thyroid Function Tests: No results for input(s): TSH, T4TOTAL, FREET4, T3FREE, THYROIDAB in the last 72 hours. Anemia Panel: Recent Labs    05/31/19 1030  FERRITIN 82   Urine analysis:    Component Value Date/Time   PROTEINUR NEGATIVE 05/31/2019 1020    Radiological Exams on Admission: Ct Chest W Contrast  Result Date: 05/31/2019 CLINICAL DATA:  Colorectal cancer staging with liver and lung metastases. Nausea and vomiting. Radiation therapy complete in June. Ongoing chemotherapy. EXAM: CT  CHEST, ABDOMEN, AND PELVIS WITH CONTRAST TECHNIQUE: Multidetector CT imaging of the chest, abdomen and pelvis was performed following the standard protocol during bolus administration of intravenous contrast. CONTRAST:  161mL OMNIPAQUE IOHEXOL 300 MG/ML  SOLN COMPARISON:  CT chest 02/13/2019 and CT abdomen pelvis 01/18/2019. FINDINGS: CT CHEST FINDINGS Cardiovascular: Although the study was not performed to optimize for the detection of pulmonary emboli, there is a saddle embolus at the bifurcation of the right and left pulmonary arteries, with filling defects in the lobar pulmonary arteries bilaterally. Right IJ Port-A-Cath terminates in the low SVC, near the SVC RA junction. Atherosclerotic calcification of the aorta and coronary arteries. Heart size normal. No pericardial effusion. Mediastinum/Nodes: Lymph node anterior to the proximal left pulmonary artery measures 11 mm (2/28), increased from 4 mm. No additional pathologically enlarged mediastinal, hilar or axillary lymph nodes. Esophagus is unremarkable. Lungs/Pleura: Some pulmonary nodules have improved in the interval. For example, right middle lobe nodules have nearly completely resolved (6/96 and 100). Others have enlarged. Index nodule in the peripheral right lower lobe measures 12 mm (6/107), previously 5 mm. An 8 mm nodule in the apical left upper lobe (6/46) has also increased in size from 4 mm. No pleural fluid. Airway is unremarkable. Musculoskeletal: No worrisome lytic or sclerotic lesions. CT ABDOMEN PELVIS FINDINGS Hepatobiliary: Dominant heterogeneous low-attenuation lesion in the dome of the liver is similar in size, measuring 3.6 x 4.0 cm (2/42), previously 2.9 x 4.3 cm. Other lesions have increased in size, however. A 3.6 cm heterogeneous lesion slightly more inferiorly within the right hepatic lobe (2/48) is larger, previously measuring 1.6 cm. Index lesion in the left hepatic lobe measures 3.1 cm (2/53), previously 1.7 cm. Gallbladder is  unremarkable. No biliary ductal dilatation. Pancreas: Negative. Spleen: Negative. Adrenals/Urinary Tract: Adrenal glands are unremarkable. Low-attenuation lesions in the kidneys measure up to 3.2 cm on the left and are likely cysts. Kidneys are otherwise unremarkable. Ureters are decompressed. Bladder is unremarkable. Stomach/Bowel: Stomach, small bowel, appendix and majority of the colon are unremarkable. A metallic stent is seen in the rectosigmoid colon, where there is wall thickening and luminal  narrowing. Adjacent soft tissue nodule along the left lateral margin of the rectosigmoid colon is slightly larger, measuring 1.5 cm (2/122), previously 12 mm. A fair amount of stool in the colon is indicative of constipation. Vascular/Lymphatic: Atherosclerotic calcification of the aorta without aneurysm. Periportal lymph node measures 1.1 cm (2/59), increased from 8 mm. A new low aortocaval lymph node measures 8 mm (2/79). Reproductive: Prostate is visualized. Other: Presacral edema is similar. Previously mentioned left perirectal soft tissue nodule appears slightly more prominent. There is a new nodule in the right perirectal fat, measuring 6 mm (2/116). Omental nodules in the right lateral abdomen measure up to 1.4 cm (2/69), slightly decreased in size from 1.6 cm. Small bilateral inguinal hernias contain fat. Small umbilical hernia contains fat. Musculoskeletal: No worrisome lytic or sclerotic lesions. IMPRESSION: 1. Pulmonary emboli with a saddle embolus. Critical Value/emergent results were called by telephone at the time of interpretation on 05/31/2019 at 12:03 pm to Cira Rue, NP , who verbally acknowledged these results. 2. Slight increase in perirectal soft tissue nodularity with overall worsening in pulmonary and hepatic metastatic disease. Omental nodularity appears slightly improved. 3. New mediastinal and periportal adenopathy. Aortocaval lymph node is not enlarged by CT size criteria but is new and  therefore considered suspicious for metastatic involvement. 4. Aortic atherosclerosis (ICD10-170.0). Coronary artery calcification. Electronically Signed   By: Lorin Picket M.D.   On: 05/31/2019 12:30   Ct Abdomen Pelvis W Contrast  Result Date: 05/31/2019 CLINICAL DATA:  Colorectal cancer staging with liver and lung metastases. Nausea and vomiting. Radiation therapy complete in June. Ongoing chemotherapy. EXAM: CT CHEST, ABDOMEN, AND PELVIS WITH CONTRAST TECHNIQUE: Multidetector CT imaging of the chest, abdomen and pelvis was performed following the standard protocol during bolus administration of intravenous contrast. CONTRAST:  117mL OMNIPAQUE IOHEXOL 300 MG/ML  SOLN COMPARISON:  CT chest 02/13/2019 and CT abdomen pelvis 01/18/2019. FINDINGS: CT CHEST FINDINGS Cardiovascular: Although the study was not performed to optimize for the detection of pulmonary emboli, there is a saddle embolus at the bifurcation of the right and left pulmonary arteries, with filling defects in the lobar pulmonary arteries bilaterally. Right IJ Port-A-Cath terminates in the low SVC, near the SVC RA junction. Atherosclerotic calcification of the aorta and coronary arteries. Heart size normal. No pericardial effusion. Mediastinum/Nodes: Lymph node anterior to the proximal left pulmonary artery measures 11 mm (2/28), increased from 4 mm. No additional pathologically enlarged mediastinal, hilar or axillary lymph nodes. Esophagus is unremarkable. Lungs/Pleura: Some pulmonary nodules have improved in the interval. For example, right middle lobe nodules have nearly completely resolved (6/96 and 100). Others have enlarged. Index nodule in the peripheral right lower lobe measures 12 mm (6/107), previously 5 mm. An 8 mm nodule in the apical left upper lobe (6/46) has also increased in size from 4 mm. No pleural fluid. Airway is unremarkable. Musculoskeletal: No worrisome lytic or sclerotic lesions. CT ABDOMEN PELVIS FINDINGS Hepatobiliary:  Dominant heterogeneous low-attenuation lesion in the dome of the liver is similar in size, measuring 3.6 x 4.0 cm (2/42), previously 2.9 x 4.3 cm. Other lesions have increased in size, however. A 3.6 cm heterogeneous lesion slightly more inferiorly within the right hepatic lobe (2/48) is larger, previously measuring 1.6 cm. Index lesion in the left hepatic lobe measures 3.1 cm (2/53), previously 1.7 cm. Gallbladder is unremarkable. No biliary ductal dilatation. Pancreas: Negative. Spleen: Negative. Adrenals/Urinary Tract: Adrenal glands are unremarkable. Low-attenuation lesions in the kidneys measure up to 3.2 cm on the left and are  likely cysts. Kidneys are otherwise unremarkable. Ureters are decompressed. Bladder is unremarkable. Stomach/Bowel: Stomach, small bowel, appendix and majority of the colon are unremarkable. A metallic stent is seen in the rectosigmoid colon, where there is wall thickening and luminal narrowing. Adjacent soft tissue nodule along the left lateral margin of the rectosigmoid colon is slightly larger, measuring 1.5 cm (2/122), previously 12 mm. A fair amount of stool in the colon is indicative of constipation. Vascular/Lymphatic: Atherosclerotic calcification of the aorta without aneurysm. Periportal lymph node measures 1.1 cm (2/59), increased from 8 mm. A new low aortocaval lymph node measures 8 mm (2/79). Reproductive: Prostate is visualized. Other: Presacral edema is similar. Previously mentioned left perirectal soft tissue nodule appears slightly more prominent. There is a new nodule in the right perirectal fat, measuring 6 mm (2/116). Omental nodules in the right lateral abdomen measure up to 1.4 cm (2/69), slightly decreased in size from 1.6 cm. Small bilateral inguinal hernias contain fat. Small umbilical hernia contains fat. Musculoskeletal: No worrisome lytic or sclerotic lesions. IMPRESSION: 1. Pulmonary emboli with a saddle embolus. Critical Value/emergent results were called by  telephone at the time of interpretation on 05/31/2019 at 12:03 pm to Cira Rue, NP , who verbally acknowledged these results. 2. Slight increase in perirectal soft tissue nodularity with overall worsening in pulmonary and hepatic metastatic disease. Omental nodularity appears slightly improved. 3. New mediastinal and periportal adenopathy. Aortocaval lymph node is not enlarged by CT size criteria but is new and therefore considered suspicious for metastatic involvement. 4. Aortic atherosclerosis (ICD10-170.0). Coronary artery calcification. Electronically Signed   By: Lorin Picket M.D.   On: 05/31/2019 12:30    EKG: Independently reviewed.  Sinus tachycardia (heart rate 107), QTc 537.  Assessment/Plan Principal Problem:   Acute pulmonary embolism (HCC) Active Problems:   Rectal cancer metastasized to liver (HCC)   QT prolongation   Acute saddle PE Likely precipitating factor is malignancy and Xarelto nonadherence (patient reports episodic bleeding from his colonic stent).  Slightly tachycardic on arrival, heart rate now normal.  Currently not tachypneic.  SPO2 95-98% on room air.  High-sensitivity troponin negative. CT showing PE with a saddle embolus.  No clinical signs of DVT on exam. -Cardiac monitoring -Continue Heparin infusion at this time, will need to be transition to DOAC prior to discharge. -Echocardiogram to assess for right heart strain -Second high-sensitivity troponin pending -Continuous pulse ox, supplemental oxygen PRN  Metastatic rectal cancer -Followed by Dr. Annamaria Boots from oncology.  CT showing slight increase in perirectal soft tissue nodularity but overall worsening in pulmonary and hepatic metastatic disease.  Omental nodularity appears slightly improved.  New mediastinal and periportal adenopathy.  Aortocaval lymph node is not enlarged by CT size criteria but is new and therefore considered suspicious for metastatic involvement.   -Currently on encorafenib 300 mg daily,  binimetinib 45 mg q12 hrs, and Vectibix q2 weeks.  Continue daily home medications.   -Home pain medications include MS Contin, OxyIR, tramadol, and Tylenol.  Will continue. -Patient reports episodic bleeding from his colonic stent.  Hemoglobin stable at 10.5 compared to previous labs.  Currently needs anticoagulation for acute saddle PE.  Check FOBT. -Patient will need continued oncology follow-up.  QTC prolongation on EKG QTC 537 on EKG. -Keep potassium above 4 and magnesium above 2 -Repeat EKG in a.m. -Telemetry monitoring -Avoid QT prolonging drugs if possible.  Currently on encorafenib and binimetinib which can prolong QT interval. Will not abruptly stop these medications. Please discuss with oncology in am.  DVT prophylaxis: Heparin Code Status: Full code Family Communication: No family available. Disposition Plan: Anticipate discharge after clinical improvement. Consults called: None Admission status: It is my clinical opinion that referral for OBSERVATION is reasonable and necessary in this patient based on the above information provided. The aforementioned taken together are felt to place the patient at high risk for further clinical deterioration. However it is anticipated that the patient may be medically stable for discharge from the hospital within 24 to 48 hours.  The medical decision making on this patient was of high complexity and the patient is at high risk for clinical deterioration, therefore this is a level 3 visit.  Shela Leff MD [facility] Pager [phone]  If 7PM-7AM, please contact night-coverage [email or web address] Password [facility]  05/31/2019, 9:38 PM

## 2019-05-31 NOTE — ED Triage Notes (Signed)
Pt states he was being seen for scan of torso for cancer. Saddle Pes were seen, pt had opted to go home. Wife encouraged to return to hospital.

## 2019-05-31 NOTE — ED Triage Notes (Signed)
Dr. Annamaria Boots (Oncology) phones at this time prior to his arrival to tell us that earlier today he was found by CT to have a saddle P.E. Pt. Also received Lovenox injection at ~1300 hours today. Pt. Has colon cancer with mets.

## 2019-05-31 NOTE — Progress Notes (Signed)
Pt aware of appts on 06/01/2019.  Pt provided with education and admin instructions, teachback method used, by PA Lucianne Lei on self administered Lovenox injection.  Verbalized and demonstrated understanding.  Pt tolerated initial Lovenox injection given by PA Lucianne Lei.  Pt ambulated with pulse oximeter by PA Lucianne Lei.  HR elevated from 100 to 130s, Os sats remained at 96-97%.

## 2019-05-31 NOTE — ED Provider Notes (Signed)
[facility] DEPT Provider Note   CSN: [id number] Arrival date & time: 05/31/19  1826     History   Chief Complaint No chief complaint on file.   HPI Billy Mendoza is a 68 y.o. male history of colorectal cancer with mets to the lung and liver, here presenting with shortness of breath, PE.  Patient has minimal shortness of breath for the last several weeks.  He saw his oncologist, Dr. Annamaria Boots, earlier today.  Patient had a CT chest abdomen pelvis that showed a saddle pulmonary embolus.  Patient really did not want to stay in the hospital so talked to his oncologist and was prescribed Lovenox.  He later went home and his wife is a retired [employer] scientist and felt that he should come and be admitted for IV heparin. Patient did receive 1 dose of lovenox at oncology office earlier today.      The history is provided by the patient.    Past Medical History:  Diagnosis Date   colorectal ca dx'd 07/2018   Liver metastases ([place]) dx'd 07/2018   Lung metastases ([place]) dx'd 07/2018    Patient Active Problem List   Diagnosis Date Noted   Hypokalemia 01/18/2019   PE (pulmonary thromboembolism) ([place]) 01/18/2019   Large bowel obstruction ([place])    Port-A-Cath in place 08/18/2018   Iron deficiency anemia due to chronic blood loss 08/18/2018   Rectal cancer metastasized to liver ([place]) 08/04/2018   Goals of care, counseling/discussion 08/04/2018    Past Surgical History:  Procedure Laterality Date   COLONIC STENT PLACEMENT N/A 01/19/2019   Procedure: COLONIC STENT PLACEMENT;  Surgeon: Carol Ada, MD;  Location: [facility];  Service: Endoscopy;  Laterality: N/A;   FLEXIBLE SIGMOIDOSCOPY N/A 01/19/2019   Procedure: FLEXIBLE SIGMOIDOSCOPY;  Surgeon: Carol Ada, MD;  Location: [facility];  Service: Endoscopy;  Laterality: N/A;   IR IMAGING GUIDED PORT INSERTION  08/16/2018   IR US GUIDE BX ASP/DRAIN  08/16/2018        Home Medications    Prior to  Admission medications   Medication Sig Start Date End Date Taking? Authorizing Provider  acetaminophen (TYLENOL) 325 MG tablet Take 650 mg by mouth every 6 (six) hours as needed for moderate pain.   Yes [provider]  Binimetinib (MEKTOVI) 15 MG TABS Take 45 mg by mouth 2 (two) times a day. 01/25/19  Yes Truitt Merle, MD  diphenoxylate-atropine (LOMOTIL) 2.5-0.025 MG tablet Take 1-2 tablets by mouth 4 (four) times daily as needed for diarrhea or loose stools. Up to 8 tabs daily for severe diarrhea 09/07/18  Yes Truitt Merle, MD  doxycycline (VIBRA-TABS) 100 MG tablet TAKE 1 TABLET(100 MG) BY MOUTH TWICE DAILY Patient taking differently: Take 100 mg by mouth 2 (two) times daily.  05/15/19  Yes Alla Feeling, NP  Encorafenib (BRAFTOVI) 75 MG CAPS Take 300 mg by mouth daily. 01/25/19  Yes Truitt Merle, MD  enoxaparin (LOVENOX) 100 MG/ML injection Inject 1 mL (100 mg total) into the skin every 12 (twelve) hours. 05/31/19 07/01/19 Yes Tanner, Lyndon Code., PA-C  MAGNESIUM-OXIDE 400 (241.3 Mg) MG tablet TAKE 1 TABLET BY MOUTH DAILY Patient taking differently: Take 200 mg by mouth 2 (two) times daily.  05/15/19  Yes Alla Feeling, NP  morphine (MS CONTIN) 15 MG 12 hr tablet Take 1 tablet (15 mg total) by mouth every 12 (twelve) hours. Patient taking differently: Take 15 mg by mouth every 12 (twelve) hours as needed for pain.  04/03/19  Yes Alla Feeling, NP  oxyCODONE (OXY IR/ROXICODONE) 5 MG immediate release tablet Take 1-2 tablets (5-10 mg total) by mouth every 4 (four) hours as needed for severe pain. 03/16/19  Yes Truitt Merle, MD  potassium chloride SA (K-DUR) 20 MEQ tablet Take 1 tablet (20 mEq total) by mouth 2 (two) times daily. 02/20/19  Yes Truitt Merle, MD  traMADol (ULTRAM) 50 MG tablet Take 1 tablet (50 mg total) by mouth every 6 (six) hours as needed. Patient taking differently: Take 50 mg by mouth every 6 (six) hours as needed for moderate pain.  03/09/19  Yes Alla Feeling, NP  VITAMIN E PO Take 1  tablet by mouth daily.   Yes [provider]  clindamycin (CLINDAGEL) 1 % gel Apply topically 2 (two) times daily. Patient not taking: Reported on 05/31/2019 02/01/19   Truitt Merle, MD  XARELTO 15 MG TABS tablet TAKE 1 TABLET(15 MG) BY MOUTH DAILY WITH SUPPER Patient not taking: No sig reported 04/13/19   Alla Feeling, NP  prochlorperazine (COMPAZINE) 10 MG tablet Take 1 tablet (10 mg total) by mouth every 6 (six) hours as needed (Nausea or vomiting). Patient not taking: Reported on 08/30/2018 08/04/18 09/01/18  Truitt Merle, MD    Family History Family History  Problem Relation Age of Onset   Cancer Mother 90       breast cancer   Atrial fibrillation Father    Heart failure Father    Cancer Maternal Grandmother        lung cancer     Social History Social History   Tobacco Use   Smoking status: Former Smoker    Years: 10.00    Types: Cigarettes, Cigars   Smokeless tobacco: Never Used   Tobacco comment: quit in Sep 2019   Substance Use Topics   Alcohol use: Yes    Alcohol/week: 1.0 - 2.0 standard drinks    Types: 1 - 2 Shots of liquor per week   Drug use: Never     Allergies   Patient has no known allergies.   Review of Systems Review of Systems  Respiratory: Positive for shortness of breath.   All other systems reviewed and are negative.    Physical Exam Updated Vital Signs BP 119/88    Pulse 91    Temp 99.2 F (37.3 C) (Oral)    Resp 17    Ht 6\' 1"  (1.854 m)    Wt 101.6 kg    SpO2 98%    BMI 29.55 kg/m   Physical Exam Vitals signs reviewed.  HENT:     Head: Normocephalic.     Nose: Nose normal.     Mouth/Throat:     Mouth: Mucous membranes are moist.  Eyes:     Extraocular Movements: Extraocular movements intact.     Pupils: Pupils are equal, round, and reactive to light.  Neck:     Musculoskeletal: Normal range of motion.  Cardiovascular:     Pulses: Normal pulses.     Comments: Borderline tachycardic  Pulmonary:     Effort:  Pulmonary effort is normal.     Breath sounds: Normal breath sounds.  Abdominal:     General: Abdomen is flat.     Palpations: Abdomen is soft.  Musculoskeletal: Normal range of motion.  Skin:    General: Skin is warm.     Capillary Refill: Capillary refill takes less than 2 seconds.  Neurological:     General: No focal deficit present.  Mental Status: He is alert and oriented to person, place, and time.  Psychiatric:        Mood and Affect: Mood normal.        Behavior: Behavior normal.      ED Treatments / Results  Labs (all labs ordered are listed, but only abnormal results are displayed) Labs Reviewed  CBC WITH DIFFERENTIAL/PLATELET - Abnormal; Notable for the following components:      Result Value   RBC 3.45 (*)    Hemoglobin 10.5 (*)    HCT 33.6 (*)    RDW 16.3 (*)    Abs Immature Granulocytes 0.13 (*)    All other components within normal limits  COMPREHENSIVE METABOLIC PANEL - Abnormal; Notable for the following components:   CO2 18 (*)    Glucose, Bld 118 (*)    Creatinine, Ser 1.27 (*)    Calcium 8.4 (*)    Albumin 2.7 (*)    Alkaline Phosphatase 261 (*)    GFR calc non Af Amer 59 (*)    All other components within normal limits  SARS CORONAVIRUS 2 (HOSPITAL ORDER, [place] [facility])  PROTIME-INR  TROPONIN I (HIGH SENSITIVITY)  TROPONIN I (HIGH SENSITIVITY)    EKG None  Radiology Ct Chest W Contrast  Result Date: 05/31/2019 CLINICAL DATA:  Colorectal cancer staging with liver and lung metastases. Nausea and vomiting. Radiation therapy complete in June. Ongoing chemotherapy. EXAM: CT CHEST, ABDOMEN, AND PELVIS WITH CONTRAST TECHNIQUE: Multidetector CT imaging of the chest, abdomen and pelvis was performed following the standard protocol during bolus administration of intravenous contrast. CONTRAST:  1108mL OMNIPAQUE IOHEXOL 300 MG/ML  SOLN COMPARISON:  CT chest 02/13/2019 and CT abdomen pelvis 01/18/2019. FINDINGS: CT CHEST FINDINGS  Cardiovascular: Although the study was not performed to optimize for the detection of pulmonary emboli, there is a saddle embolus at the bifurcation of the right and left pulmonary arteries, with filling defects in the lobar pulmonary arteries bilaterally. Right IJ Port-A-Cath terminates in the low SVC, near the SVC RA junction. Atherosclerotic calcification of the aorta and coronary arteries. Heart size normal. No pericardial effusion. Mediastinum/Nodes: Lymph node anterior to the proximal left pulmonary artery measures 11 mm (2/28), increased from 4 mm. No additional pathologically enlarged mediastinal, hilar or axillary lymph nodes. Esophagus is unremarkable. Lungs/Pleura: Some pulmonary nodules have improved in the interval. For example, right middle lobe nodules have nearly completely resolved (6/96 and 100). Others have enlarged. Index nodule in the peripheral right lower lobe measures 12 mm (6/107), previously 5 mm. An 8 mm nodule in the apical left upper lobe (6/46) has also increased in size from 4 mm. No pleural fluid. Airway is unremarkable. Musculoskeletal: No worrisome lytic or sclerotic lesions. CT ABDOMEN PELVIS FINDINGS Hepatobiliary: Dominant heterogeneous low-attenuation lesion in the dome of the liver is similar in size, measuring 3.6 x 4.0 cm (2/42), previously 2.9 x 4.3 cm. Other lesions have increased in size, however. A 3.6 cm heterogeneous lesion slightly more inferiorly within the right hepatic lobe (2/48) is larger, previously measuring 1.6 cm. Index lesion in the left hepatic lobe measures 3.1 cm (2/53), previously 1.7 cm. Gallbladder is unremarkable. No biliary ductal dilatation. Pancreas: Negative. Spleen: Negative. Adrenals/Urinary Tract: Adrenal glands are unremarkable. Low-attenuation lesions in the kidneys measure up to 3.2 cm on the left and are likely cysts. Kidneys are otherwise unremarkable. Ureters are decompressed. Bladder is unremarkable. Stomach/Bowel: Stomach, small bowel,  appendix and majority of the colon are unremarkable. A metallic stent  is seen in the rectosigmoid colon, where there is wall thickening and luminal narrowing. Adjacent soft tissue nodule along the left lateral margin of the rectosigmoid colon is slightly larger, measuring 1.5 cm (2/122), previously 12 mm. A fair amount of stool in the colon is indicative of constipation. Vascular/Lymphatic: Atherosclerotic calcification of the aorta without aneurysm. Periportal lymph node measures 1.1 cm (2/59), increased from 8 mm. A new low aortocaval lymph node measures 8 mm (2/79). Reproductive: Prostate is visualized. Other: Presacral edema is similar. Previously mentioned left perirectal soft tissue nodule appears slightly more prominent. There is a new nodule in the right perirectal fat, measuring 6 mm (2/116). Omental nodules in the right lateral abdomen measure up to 1.4 cm (2/69), slightly decreased in size from 1.6 cm. Small bilateral inguinal hernias contain fat. Small umbilical hernia contains fat. Musculoskeletal: No worrisome lytic or sclerotic lesions. IMPRESSION: 1. Pulmonary emboli with a saddle embolus. Critical Value/emergent results were called by telephone at the time of interpretation on 05/31/2019 at 12:03 pm to Cira Rue, NP , who verbally acknowledged these results. 2. Slight increase in perirectal soft tissue nodularity with overall worsening in pulmonary and hepatic metastatic disease. Omental nodularity appears slightly improved. 3. New mediastinal and periportal adenopathy. Aortocaval lymph node is not enlarged by CT size criteria but is new and therefore considered suspicious for metastatic involvement. 4. Aortic atherosclerosis (ICD10-170.0). Coronary artery calcification. Electronically Signed   By: Lorin Picket M.D.   On: 05/31/2019 12:30   Ct Abdomen Pelvis W Contrast  Result Date: 05/31/2019 CLINICAL DATA:  Colorectal cancer staging with liver and lung metastases. Nausea and vomiting.  Radiation therapy complete in June. Ongoing chemotherapy. EXAM: CT CHEST, ABDOMEN, AND PELVIS WITH CONTRAST TECHNIQUE: Multidetector CT imaging of the chest, abdomen and pelvis was performed following the standard protocol during bolus administration of intravenous contrast. CONTRAST:  153mL OMNIPAQUE IOHEXOL 300 MG/ML  SOLN COMPARISON:  CT chest 02/13/2019 and CT abdomen pelvis 01/18/2019. FINDINGS: CT CHEST FINDINGS Cardiovascular: Although the study was not performed to optimize for the detection of pulmonary emboli, there is a saddle embolus at the bifurcation of the right and left pulmonary arteries, with filling defects in the lobar pulmonary arteries bilaterally. Right IJ Port-A-Cath terminates in the low SVC, near the SVC RA junction. Atherosclerotic calcification of the aorta and coronary arteries. Heart size normal. No pericardial effusion. Mediastinum/Nodes: Lymph node anterior to the proximal left pulmonary artery measures 11 mm (2/28), increased from 4 mm. No additional pathologically enlarged mediastinal, hilar or axillary lymph nodes. Esophagus is unremarkable. Lungs/Pleura: Some pulmonary nodules have improved in the interval. For example, right middle lobe nodules have nearly completely resolved (6/96 and 100). Others have enlarged. Index nodule in the peripheral right lower lobe measures 12 mm (6/107), previously 5 mm. An 8 mm nodule in the apical left upper lobe (6/46) has also increased in size from 4 mm. No pleural fluid. Airway is unremarkable. Musculoskeletal: No worrisome lytic or sclerotic lesions. CT ABDOMEN PELVIS FINDINGS Hepatobiliary: Dominant heterogeneous low-attenuation lesion in the dome of the liver is similar in size, measuring 3.6 x 4.0 cm (2/42), previously 2.9 x 4.3 cm. Other lesions have increased in size, however. A 3.6 cm heterogeneous lesion slightly more inferiorly within the right hepatic lobe (2/48) is larger, previously measuring 1.6 cm. Index lesion in the left hepatic  lobe measures 3.1 cm (2/53), previously 1.7 cm. Gallbladder is unremarkable. No biliary ductal dilatation. Pancreas: Negative. Spleen: Negative. Adrenals/Urinary Tract: Adrenal glands are unremarkable. Low-attenuation lesions  in the kidneys measure up to 3.2 cm on the left and are likely cysts. Kidneys are otherwise unremarkable. Ureters are decompressed. Bladder is unremarkable. Stomach/Bowel: Stomach, small bowel, appendix and majority of the colon are unremarkable. A metallic stent is seen in the rectosigmoid colon, where there is wall thickening and luminal narrowing. Adjacent soft tissue nodule along the left lateral margin of the rectosigmoid colon is slightly larger, measuring 1.5 cm (2/122), previously 12 mm. A fair amount of stool in the colon is indicative of constipation. Vascular/Lymphatic: Atherosclerotic calcification of the aorta without aneurysm. Periportal lymph node measures 1.1 cm (2/59), increased from 8 mm. A new low aortocaval lymph node measures 8 mm (2/79). Reproductive: Prostate is visualized. Other: Presacral edema is similar. Previously mentioned left perirectal soft tissue nodule appears slightly more prominent. There is a new nodule in the right perirectal fat, measuring 6 mm (2/116). Omental nodules in the right lateral abdomen measure up to 1.4 cm (2/69), slightly decreased in size from 1.6 cm. Small bilateral inguinal hernias contain fat. Small umbilical hernia contains fat. Musculoskeletal: No worrisome lytic or sclerotic lesions. IMPRESSION: 1. Pulmonary emboli with a saddle embolus. Critical Value/emergent results were called by telephone at the time of interpretation on 05/31/2019 at 12:03 pm to Cira Rue, NP , who verbally acknowledged these results. 2. Slight increase in perirectal soft tissue nodularity with overall worsening in pulmonary and hepatic metastatic disease. Omental nodularity appears slightly improved. 3. New mediastinal and periportal adenopathy. Aortocaval  lymph node is not enlarged by CT size criteria but is new and therefore considered suspicious for metastatic involvement. 4. Aortic atherosclerosis (ICD10-170.0). Coronary artery calcification. Electronically Signed   By: Lorin Picket M.D.   On: 05/31/2019 12:30    Procedures Procedures (including critical care time)  CRITICAL CARE Performed by: Wandra Arthurs   Total critical care time: 30 minutes  Critical care time was exclusive of separately billable procedures and treating other patients.  Critical care was necessary to treat or prevent imminent or life-threatening deterioration.  Critical care was time spent personally by me on the following activities: development of treatment plan with patient and/or surrogate as well as nursing, discussions with consultants, evaluation of patient's response to treatment, examination of patient, obtaining history from patient or surrogate, ordering and performing treatments and interventions, ordering and review of laboratory studies, ordering and review of radiographic studies, pulse oximetry and re-evaluation of patient's condition.   Medications Ordered in ED Medications  heparin ADULT infusion 100 units/mL (25000 units/231mL sodium chloride 0.45%) (has no administration in time range)     Initial Impression / Assessment and Plan / ED Course  I have reviewed the triage vital signs and the nursing notes.  Pertinent labs & imaging results that were available during my care of the patient were reviewed by me and considered in my medical decision making (see chart for details).        Billy Mendoza is a 68 y.o. male here presenting with shortness of breath with outpatient CT that showed a saddle embolus.  Patient is borderline tachycardic and not hypotensive or hypoxic.  I think has PE likely from his metastatic cancer.  He is hemodynamically stable currently and does not require any pressors or any oxygen right now.  I started patient on IV  heparin and hospitalist will admit.  Does not need ICU care currently.   Final Clinical Impressions(s) / ED Diagnoses   Final diagnoses:  None    ED Discharge Orders  None       Drenda Freeze, MD 05/31/19 2056

## 2019-06-01 ENCOUNTER — Inpatient Hospital Stay: Payer: BC Managed Care – PPO | Admitting: Hematology

## 2019-06-01 ENCOUNTER — Inpatient Hospital Stay: Payer: BC Managed Care – PPO

## 2019-06-01 ENCOUNTER — Other Ambulatory Visit: Payer: BC Managed Care – PPO

## 2019-06-01 ENCOUNTER — Observation Stay (HOSPITAL_COMMUNITY): Payer: BC Managed Care – PPO

## 2019-06-01 ENCOUNTER — Observation Stay (HOSPITAL_BASED_OUTPATIENT_CLINIC_OR_DEPARTMENT_OTHER): Payer: BC Managed Care – PPO

## 2019-06-01 DIAGNOSIS — T45516A Underdosing of anticoagulants, initial encounter: Secondary | ICD-10-CM | POA: Diagnosis present

## 2019-06-01 DIAGNOSIS — Z91128 Patient's intentional underdosing of medication regimen for other reason: Secondary | ICD-10-CM | POA: Diagnosis not present

## 2019-06-01 DIAGNOSIS — Z803 Family history of malignant neoplasm of breast: Secondary | ICD-10-CM | POA: Diagnosis not present

## 2019-06-01 DIAGNOSIS — I2699 Other pulmonary embolism without acute cor pulmonale: Secondary | ICD-10-CM

## 2019-06-01 DIAGNOSIS — C7800 Secondary malignant neoplasm of unspecified lung: Secondary | ICD-10-CM | POA: Diagnosis present

## 2019-06-01 DIAGNOSIS — Z8249 Family history of ischemic heart disease and other diseases of the circulatory system: Secondary | ICD-10-CM | POA: Diagnosis not present

## 2019-06-01 DIAGNOSIS — I2692 Saddle embolus of pulmonary artery without acute cor pulmonale: Secondary | ICD-10-CM | POA: Diagnosis present

## 2019-06-01 DIAGNOSIS — Z9221 Personal history of antineoplastic chemotherapy: Secondary | ICD-10-CM | POA: Diagnosis not present

## 2019-06-01 DIAGNOSIS — I82412 Acute embolism and thrombosis of left femoral vein: Secondary | ICD-10-CM | POA: Diagnosis present

## 2019-06-01 DIAGNOSIS — C20 Malignant neoplasm of rectum: Secondary | ICD-10-CM | POA: Diagnosis not present

## 2019-06-01 DIAGNOSIS — Z20828 Contact with and (suspected) exposure to other viral communicable diseases: Secondary | ICD-10-CM | POA: Diagnosis present

## 2019-06-01 DIAGNOSIS — C19 Malignant neoplasm of rectosigmoid junction: Secondary | ICD-10-CM | POA: Diagnosis present

## 2019-06-01 DIAGNOSIS — D509 Iron deficiency anemia, unspecified: Secondary | ICD-10-CM | POA: Diagnosis present

## 2019-06-01 DIAGNOSIS — I82442 Acute embolism and thrombosis of left tibial vein: Secondary | ICD-10-CM | POA: Diagnosis present

## 2019-06-01 DIAGNOSIS — E876 Hypokalemia: Secondary | ICD-10-CM | POA: Diagnosis present

## 2019-06-01 DIAGNOSIS — C787 Secondary malignant neoplasm of liver and intrahepatic bile duct: Secondary | ICD-10-CM

## 2019-06-01 DIAGNOSIS — I10 Essential (primary) hypertension: Secondary | ICD-10-CM | POA: Diagnosis present

## 2019-06-01 DIAGNOSIS — R9431 Abnormal electrocardiogram [ECG] [EKG]: Secondary | ICD-10-CM | POA: Diagnosis present

## 2019-06-01 DIAGNOSIS — Z801 Family history of malignant neoplasm of trachea, bronchus and lung: Secondary | ICD-10-CM | POA: Diagnosis not present

## 2019-06-01 DIAGNOSIS — I82432 Acute embolism and thrombosis of left popliteal vein: Secondary | ICD-10-CM | POA: Diagnosis present

## 2019-06-01 LAB — OCCULT BLOOD X 1 CARD TO LAB, STOOL: Fecal Occult Bld: POSITIVE — AB

## 2019-06-01 LAB — CBC
HCT: 30.6 % — ABNORMAL LOW (ref 39.0–52.0)
Hemoglobin: 9.4 g/dL — ABNORMAL LOW (ref 13.0–17.0)
MCH: 30.4 pg (ref 26.0–34.0)
MCHC: 30.7 g/dL (ref 30.0–36.0)
MCV: 99 fL (ref 80.0–100.0)
Platelets: 201 10*3/uL (ref 150–400)
RBC: 3.09 MIL/uL — ABNORMAL LOW (ref 4.22–5.81)
RDW: 16.4 % — ABNORMAL HIGH (ref 11.5–15.5)
WBC: 7.2 10*3/uL (ref 4.0–10.5)
nRBC: 0 % (ref 0.0–0.2)

## 2019-06-01 LAB — ECHOCARDIOGRAM COMPLETE
Height: 73 in
Weight: 3632 oz

## 2019-06-01 LAB — MAGNESIUM: Magnesium: 2.1 mg/dL (ref 1.7–2.4)

## 2019-06-01 LAB — HEPARIN LEVEL (UNFRACTIONATED)
Heparin Unfractionated: 0.84 IU/mL — ABNORMAL HIGH (ref 0.30–0.70)
Heparin Unfractionated: 0.45 IU/mL (ref 0.30–0.70)
Heparin Unfractionated: 0.46 IU/mL (ref 0.30–0.70)

## 2019-06-01 MED ORDER — SODIUM CHLORIDE 0.9% FLUSH
10.0000 mL | INTRAVENOUS | Status: DC | PRN
  Administered 2019-06-02: 10 mL
  Filled 2019-06-01: qty 40

## 2019-06-01 MED ORDER — SODIUM CHLORIDE 0.9% FLUSH: 10.0000 mL | Freq: Two times a day (BID) | INTRAVENOUS | Status: DC

## 2019-06-01 MED ORDER — MAGNESIUM SULFATE 2 GM/50ML IV SOLN
2.0000 g | Freq: Once | INTRAVENOUS | Status: AC
  Administered 2019-06-01: 02:00:00 2 g via INTRAVENOUS
  Filled 2019-06-01: qty 50

## 2019-06-01 NOTE — Progress Notes (Addendum)
HEMATOLOGY-ONCOLOGY PROGRESS NOTE  SUBJECTIVE: Mr. Billy Mendoza was admitted secondary to pulmonary emboli with a saddle embolus.  He was seen in our office yesterday due to this critical result.  In our office, he was offered admission, but the patient declined.  He was started on Lovenox and received his first dose in our office yesterday.  Once getting home, his wife was concerned and felt he should be admitted to the hospital.  He returned and was admitted.  He has been started on a heparin drip.  When seen this morning, the patient reports that he is feeling well.  He denies chest discomfort and shortness of breath.  He has had a little bit of rectal bleeding which he does not think is unchanged from his baseline.  He has no other complaints this morning.  Oncology History Overview Note  Cancer Staging Rectal cancer metastasized to liver [facility]) Staging form: Colon and Rectum, AJCC 8th Edition - Clinical stage from 07/28/2018: Stage IVB (cTX, cNX, pM1b) - Signed by Truitt Merle, MD on 08/18/2018     Rectal cancer metastasized to liver ([place])  07/28/2018 Initial Biopsy   Biopsy 07/28/18 Final Microscopic Diagnosis A.Colon, Ascending, Polyp:  -Fragments of tubular adenoma -No high-grade dysplasia of malignancy.  B. Rectum, Mass, Polyp:  -Invasive adenocarcinoma, moderately - poorly differentiatred.    07/28/2018 Cancer Staging   Staging form: Colon and Rectum, AJCC 8th Edition - Clinical stage from 07/28/2018: Stage IVB (cTX, cNX, pM1b) - Signed by Truitt Merle, MD on 08/18/2018   08/04/2018 Initial Diagnosis   Rectal cancer metastasized to liver ([place])   08/16/2018 Pathology Results   Biospy  Diagnosis 08/16/18 Liver, needle/core biopsy - ADENOCARCINOMA. Microscopic Comment By morphology, the findings are compatible with the provided clinical history of colorectal adenocarcinoma. Results reported to [name] on 08/18/2018. Intradepartmental consultation was obtained ([name]).     08/16/2018 Miscellaneous   Foundation 1 genomic testing: MS-stable Tumor mutation burden  3/Mb K-ras/NRAS wild-type BRAF V600 E KDMSA R266Q PTEN T331f* SMAD4 loss TP53 loss     08/18/2018 Procedure   Colonoscopy by Dr. HBenson Norwayshowed malignant appearing partially obstructing tumor in the rectum, 8 cm above anal verge.   08/18/2018 - 09/07/2018 Chemotherapy   CAPOX q3weeks with Xeloda 20076mBID 2 weeks on, 1 weeks off starting 08/18/18.  Swithced treatment due to BRAF mutation.    09/07/2018 - 12/24/2018 Chemotherapy   FOLFOXIRI and avastin every 2 weeks starting 09/07/18. Oxaliplatin held starting with cycle 8 and continue with FOLFIRI and Avastin. D/c due to disease progression.    11/04/2018 Imaging   CT CAP 11/04/18 IMPRESSION: 1. Peripheral filling defect in the right main pulmonary artery extending primarily into the right lower lobe pulmonary artery. While the peripheral location indicates some degree of chronicity of this pulmonary embolus, this is a new finding compared to 07/28/2018. The right ventricular to left ventricular ratio is 0.7, which does not specifically indicate right heart strain. 2. The malignancy appears considerably improved, with reduced size pulmonary nodules; reduced size and increased internal necrosis of the hepatic metastatic lesions; marked improvement in the thoracic adenopathy; reduced conspicuity of the rectal tumor; and significant reduction in perirectal tumor burden/adenopathy. 3. Other imaging findings of potential clinical significance: Aortic Atherosclerosis (ICD10-I70.0). Coronary atherosclerosis. Bilateral renal cysts. Small umbilical hernia contains adipose tissue.  Critical Value/emergent results were called by telephone at the time of interpretation on 11/04/2018 at 3:05 pm to Dr. YATruitt Merle who verbally acknowledged these results.   01/18/2019  Imaging   CT AP W contrast  IMPRESSION: 1. Colonic obstruction resulting from rectal  carcinoma. 2. Hepatic metastatic disease which is decreased in size compared with the prior examination of 11/04/2018. 3. Multiple right middle lobe and right lower lobe pulmonary nodules most consistent with metastatic disease.   02/01/2019 -  Chemotherapy   second line BRAFTOVI(encorafenib) 321m daily and MEKTOVI(binimetinib), 459mq12h and Vectibix q2weeks starting 02/01/19.  -[facility] due to RT    02/13/2019 Imaging   CT Chest  IMPRESSION: Small bilateral pulmonary metastases, mildly progressive from January 2020.  Multifocal hepatic metastases, incompletely visualized, favored to be progressive from April 2020.  Aortic Atherosclerosis (ICD10-I70.0).    02/22/2019 - 03/21/2019 Radiation Therapy   -Palliative local radiation  -Holding mektovi (binimetinib) during RT -Due to significant rectal pain, diarrhea and stool incontinence, he stopped RT after 03/13/19. He was only able to finish 1/2 remianing treatments on 03/21/19.    05/31/2019 Imaging   CT CAP W contrast 05/31/19  IMPRESSION: 1. Pulmonary emboli with a saddle embolus. Critical Value/emergent results were called by telephone at the time of interpretation on 05/31/2019 at 12:03 pm to [name] , who verbally acknowledged these results. 2. Slight increase in perirectal soft tissue nodularity with overall worsening in pulmonary and hepatic metastatic disease. Omental nodularity appears slightly improved. 3. New mediastinal and periportal adenopathy. Aortocaval lymph node is not enlarged by CT size criteria but is new and therefore considered suspicious for metastatic involvement. 4. Aortic atherosclerosis (ICD10-170.0). Coronary artery calcification.      REVIEW OF SYSTEMS:   Constitutional: Denies fevers, chills or abnormal weight loss Eyes: Denies blurriness of vision Ears, nose, mouth, throat, and face: Denies mucositis or sore throat Respiratory: Denies cough, dyspnea or  wheezes Cardiovascular: Denies palpitation, chest discomfort Gastrointestinal:  Denies nausea, heartburn or change in bowel habits Skin: Denies abnormal skin rashes Lymphatics: Denies new lymphadenopathy or easy bruising Neurological:Denies numbness, tingling or new weaknesses Behavioral/Psych: Mood is stable, no new changes  Extremities: No lower extremity edema All other systems were reviewed with the patient and are negative.  I have reviewed the past medical history, past surgical history, social history and family history with the patient and they are unchanged from previous note.   PHYSICAL EXAMINATION:  Vitals:   06/01/19 0456 06/01/19 0642  BP: (!) 135/102 (!) 131/95  Pulse: 72 70  Resp:    Temp: 98.3 F (36.8 C)   SpO2: 99%    Filed Weights   05/31/19 1837 05/31/19 2138  Weight: 224 lb (101.6 kg) 227 lb (103 kg)    Intake/Output from previous day: 08/26 0701 - 08/27 0700 In: 434.7 [P.O.:240; I.V.:144.7; IV Piggyback:50] Out: -   GENERAL:alert, no distress and comfortable SKIN: skin color, texture, turgor are normal, no rashes or significant lesions EYES: normal, Conjunctiva are pink and non-injected, sclera clear OROPHARYNX:no exudate, no erythema and lips, buccal mucosa, and tongue normal  NECK: supple, thyroid normal size, non-tender, without nodularity LYMPH:  no palpable lymphadenopathy in the cervical, axillary or inguinal LUNGS: clear to auscultation and percussion with normal breathing effort HEART: regular rate & rhythm and no murmurs and no lower extremity edema ABDOMEN:abdomen soft, non-tender and normal bowel sounds Musculoskeletal:no cyanosis of digits and no clubbing  NEURO: alert & oriented x 3 with fluent speech, no focal motor/sensory deficits  LABORATORY DATA:  I have reviewed the data as listed CMP Latest Ref Rng & Units 05/31/2019 05/31/2019 05/19/2019  Glucose 70 - 99  mg/dL 118(H) 117(H) 134(H)  BUN 8 - 23 mg/dL 19 17 19   Creatinine 0.61 -  1.24 mg/dL 1.27(H) 1.15 1.03  Sodium 135 - 145 mmol/L 138 139 140  Potassium 3.5 - 5.1 mmol/L 3.8 4.1 3.9  Chloride 98 - 111 mmol/L 107 107 108  CO2 22 - 32 mmol/L 18(L) 19(L) 21(L)  Calcium 8.9 - 10.3 mg/dL 8.4(L) 8.8(L) 8.4(L)  Total Protein 6.5 - 8.1 g/dL 6.8 6.9 6.3(L)  Total Bilirubin 0.3 - 1.2 mg/dL 0.7 0.4 0.2(L)  Alkaline Phos 38 - 126 U/L 261(H) 325(H) 229(H)  AST 15 - 41 U/L 21 18 21   ALT 0 - 44 U/L 16 15 18     Lab Results  Component Value Date   WBC 7.2 06/01/2019   HGB 9.4 (L) 06/01/2019   HCT 30.6 (L) 06/01/2019   MCV 99.0 06/01/2019   PLT 201 06/01/2019   NEUTROABS 7.1 05/31/2019    Ct Chest W Contrast  Result Date: 05/31/2019 CLINICAL DATA:  Colorectal cancer staging with liver and lung metastases. Nausea and vomiting. Radiation therapy complete in June. Ongoing chemotherapy. EXAM: CT CHEST, ABDOMEN, AND PELVIS WITH CONTRAST TECHNIQUE: Multidetector CT imaging of the chest, abdomen and pelvis was performed following the standard protocol during bolus administration of intravenous contrast. CONTRAST:  174m OMNIPAQUE IOHEXOL 300 MG/ML  SOLN COMPARISON:  CT chest 02/13/2019 and CT abdomen pelvis 01/18/2019. FINDINGS: CT CHEST FINDINGS Cardiovascular: Although the study was not performed to optimize for the detection of pulmonary emboli, there is a saddle embolus at the bifurcation of the right and left pulmonary arteries, with filling defects in the lobar pulmonary arteries bilaterally. Right IJ Port-A-Cath terminates in the low SVC, near the SVC RA junction. Atherosclerotic calcification of the aorta and coronary arteries. Heart size normal. No pericardial effusion. Mediastinum/Nodes: Lymph node anterior to the proximal left pulmonary artery measures 11 mm (2/28), increased from 4 mm. No additional pathologically enlarged mediastinal, hilar or axillary lymph nodes. Esophagus is unremarkable. Lungs/Pleura: Some pulmonary nodules have improved in the interval. For example, right  middle lobe nodules have nearly completely resolved (6/96 and 100). Others have enlarged. Index nodule in the peripheral right lower lobe measures 12 mm (6/107), previously 5 mm. An 8 mm nodule in the apical left upper lobe (6/46) has also increased in size from 4 mm. No pleural fluid. Airway is unremarkable. Musculoskeletal: No worrisome lytic or sclerotic lesions. CT ABDOMEN PELVIS FINDINGS Hepatobiliary: Dominant heterogeneous low-attenuation lesion in the dome of the liver is similar in size, measuring 3.6 x 4.0 cm (2/42), previously 2.9 x 4.3 cm. Other lesions have increased in size, however. A 3.6 cm heterogeneous lesion slightly more inferiorly within the right hepatic lobe (2/48) is larger, previously measuring 1.6 cm. Index lesion in the left hepatic lobe measures 3.1 cm (2/53), previously 1.7 cm. Gallbladder is unremarkable. No biliary ductal dilatation. Pancreas: Negative. Spleen: Negative. Adrenals/Urinary Tract: Adrenal glands are unremarkable. Low-attenuation lesions in the kidneys measure up to 3.2 cm on the left and are likely cysts. Kidneys are otherwise unremarkable. Ureters are decompressed. Bladder is unremarkable. Stomach/Bowel: Stomach, small bowel, appendix and majority of the colon are unremarkable. A metallic stent is seen in the rectosigmoid colon, where there is wall thickening and luminal narrowing. Adjacent soft tissue nodule along the left lateral margin of the rectosigmoid colon is slightly larger, measuring 1.5 cm (2/122), previously 12 mm. A fair amount of stool in the colon is indicative of constipation. Vascular/Lymphatic: Atherosclerotic calcification of the aorta without aneurysm. Periportal  lymph node measures 1.1 cm (2/59), increased from 8 mm. A new low aortocaval lymph node measures 8 mm (2/79). Reproductive: Prostate is visualized. Other: Presacral edema is similar. Previously mentioned left perirectal soft tissue nodule appears slightly more prominent. There is a new nodule  in the right perirectal fat, measuring 6 mm (2/116). Omental nodules in the right lateral abdomen measure up to 1.4 cm (2/69), slightly decreased in size from 1.6 cm. Small bilateral inguinal hernias contain fat. Small umbilical hernia contains fat. Musculoskeletal: No worrisome lytic or sclerotic lesions. IMPRESSION: 1. Pulmonary emboli with a saddle embolus. Critical Value/emergent results were called by telephone at the time of interpretation on 05/31/2019 at 12:03 pm to Cira Rue, NP , who verbally acknowledged these results. 2. Slight increase in perirectal soft tissue nodularity with overall worsening in pulmonary and hepatic metastatic disease. Omental nodularity appears slightly improved. 3. New mediastinal and periportal adenopathy. Aortocaval lymph node is not enlarged by CT size criteria but is new and therefore considered suspicious for metastatic involvement. 4. Aortic atherosclerosis (ICD10-170.0). Coronary artery calcification. Electronically Signed   By: Lorin Picket M.D.   On: 05/31/2019 12:30   Ct Abdomen Pelvis W Contrast  Result Date: 05/31/2019 CLINICAL DATA:  Colorectal cancer staging with liver and lung metastases. Nausea and vomiting. Radiation therapy complete in June. Ongoing chemotherapy. EXAM: CT CHEST, ABDOMEN, AND PELVIS WITH CONTRAST TECHNIQUE: Multidetector CT imaging of the chest, abdomen and pelvis was performed following the standard protocol during bolus administration of intravenous contrast. CONTRAST:  153m OMNIPAQUE IOHEXOL 300 MG/ML  SOLN COMPARISON:  CT chest 02/13/2019 and CT abdomen pelvis 01/18/2019. FINDINGS: CT CHEST FINDINGS Cardiovascular: Although the study was not performed to optimize for the detection of pulmonary emboli, there is a saddle embolus at the bifurcation of the right and left pulmonary arteries, with filling defects in the lobar pulmonary arteries bilaterally. Right IJ Port-A-Cath terminates in the low SVC, near the SVC RA junction.  Atherosclerotic calcification of the aorta and coronary arteries. Heart size normal. No pericardial effusion. Mediastinum/Nodes: Lymph node anterior to the proximal left pulmonary artery measures 11 mm (2/28), increased from 4 mm. No additional pathologically enlarged mediastinal, hilar or axillary lymph nodes. Esophagus is unremarkable. Lungs/Pleura: Some pulmonary nodules have improved in the interval. For example, right middle lobe nodules have nearly completely resolved (6/96 and 100). Others have enlarged. Index nodule in the peripheral right lower lobe measures 12 mm (6/107), previously 5 mm. An 8 mm nodule in the apical left upper lobe (6/46) has also increased in size from 4 mm. No pleural fluid. Airway is unremarkable. Musculoskeletal: No worrisome lytic or sclerotic lesions. CT ABDOMEN PELVIS FINDINGS Hepatobiliary: Dominant heterogeneous low-attenuation lesion in the dome of the liver is similar in size, measuring 3.6 x 4.0 cm (2/42), previously 2.9 x 4.3 cm. Other lesions have increased in size, however. A 3.6 cm heterogeneous lesion slightly more inferiorly within the right hepatic lobe (2/48) is larger, previously measuring 1.6 cm. Index lesion in the left hepatic lobe measures 3.1 cm (2/53), previously 1.7 cm. Gallbladder is unremarkable. No biliary ductal dilatation. Pancreas: Negative. Spleen: Negative. Adrenals/Urinary Tract: Adrenal glands are unremarkable. Low-attenuation lesions in the kidneys measure up to 3.2 cm on the left and are likely cysts. Kidneys are otherwise unremarkable. Ureters are decompressed. Bladder is unremarkable. Stomach/Bowel: Stomach, small bowel, appendix and majority of the colon are unremarkable. A metallic stent is seen in the rectosigmoid colon, where there is wall thickening and luminal narrowing. Adjacent soft tissue nodule  along the left lateral margin of the rectosigmoid colon is slightly larger, measuring 1.5 cm (2/122), previously 12 mm. A fair amount of stool in  the colon is indicative of constipation. Vascular/Lymphatic: Atherosclerotic calcification of the aorta without aneurysm. Periportal lymph node measures 1.1 cm (2/59), increased from 8 mm. A new low aortocaval lymph node measures 8 mm (2/79). Reproductive: Prostate is visualized. Other: Presacral edema is similar. Previously mentioned left perirectal soft tissue nodule appears slightly more prominent. There is a new nodule in the right perirectal fat, measuring 6 mm (2/116). Omental nodules in the right lateral abdomen measure up to 1.4 cm (2/69), slightly decreased in size from 1.6 cm. Small bilateral inguinal hernias contain fat. Small umbilical hernia contains fat. Musculoskeletal: No worrisome lytic or sclerotic lesions. IMPRESSION: 1. Pulmonary emboli with a saddle embolus. Critical Value/emergent results were called by telephone at the time of interpretation on 05/31/2019 at 12:03 pm to Cira Rue, NP , who verbally acknowledged these results. 2. Slight increase in perirectal soft tissue nodularity with overall worsening in pulmonary and hepatic metastatic disease. Omental nodularity appears slightly improved. 3. New mediastinal and periportal adenopathy. Aortocaval lymph node is not enlarged by CT size criteria but is new and therefore considered suspicious for metastatic involvement. 4. Aortic atherosclerosis (ICD10-170.0). Coronary artery calcification. Electronically Signed   By: Lorin Picket M.D.   On: 05/31/2019 12:30   Vas [place] Lower Extremity Venous (dvt)  Result Date: 06/01/2019  Lower Venous Study Indications: Pulmonary embolism.  Risk Factors: Colorectal cancer with lung mets. Comparison Study: no prior Performing Technologist: June Leap RDMS, RVT  Examination Guidelines: A complete evaluation includes B-mode imaging, spectral Doppler, color Doppler, and power Doppler as needed of all accessible portions of each vessel. Bilateral testing is considered an integral part of a complete  examination. Limited examinations for reoccurring indications may be performed as noted.  +---------+---------------+---------+-----------+----------+--------------+ RIGHT    CompressibilityPhasicitySpontaneityPropertiesThrombus Aging +---------+---------------+---------+-----------+----------+--------------+ CFV      Full           Yes      Yes                                 +---------+---------------+---------+-----------+----------+--------------+ SFJ      Full                                                        +---------+---------------+---------+-----------+----------+--------------+ FV Prox  Full                                                        +---------+---------------+---------+-----------+----------+--------------+ FV Mid   Full                                                        +---------+---------------+---------+-----------+----------+--------------+ FV DistalFull                                                        +---------+---------------+---------+-----------+----------+--------------+  PFV      Full                                                        +---------+---------------+---------+-----------+----------+--------------+ POP      Full           Yes      Yes                                 +---------+---------------+---------+-----------+----------+--------------+ PTV      Full                                                        +---------+---------------+---------+-----------+----------+--------------+ PERO     Full                                                        +---------+---------------+---------+-----------+----------+--------------+   +---------------+---------------+---------+-----------+----------+-------------+ LEFT           CompressibilityPhasicitySpontaneityPropertiesThrombus                                                                  Aging          +---------------+---------------+---------+-----------+----------+-------------+ CFV            None           No       No                                 +---------------+---------------+---------+-----------+----------+-------------+ SFJ            None                                                       +---------------+---------------+---------+-----------+----------+-------------+ FV Prox        None                                                       +---------------+---------------+---------+-----------+----------+-------------+ FV Mid         None                                                       +---------------+---------------+---------+-----------+----------+-------------+ FV  Distal      None                                                       +---------------+---------------+---------+-----------+----------+-------------+ PFV            None                                                       +---------------+---------------+---------+-----------+----------+-------------+ POP            None           No       No                                 +---------------+---------------+---------+-----------+----------+-------------+ PTV            Partial                                                    +---------------+---------------+---------+-----------+----------+-------------+ PERO                                                        Not                                                                       visualized    +---------------+---------------+---------+-----------+----------+-------------+ Ex Iliac                      Yes      Yes                  patent        mid-dist                                                                  +---------------+---------------+---------+-----------+----------+-------------+     Summary: Right: There is no evidence of deep vein thrombosis in the lower  extremity. No cystic structure found in the popliteal fossa. Left: Findings consistent with acute deep vein thrombosis involving the left common femoral vein, left femoral vein, left proximal profunda vein, left popliteal vein, and left posterior tibial veins. No cystic structure found in the popliteal fossa.  *See table(s) above for measurements and observations.    Preliminary     ASSESSMENT AND PLAN: 1.  Acute saddle  PE 2.  Metastatic rectal cancer 3.  Rectal bleeding 4.  Iron deficiency anemia 5.  History of PE 6.  Hypertension  -The patient is currently asymptomatic from his acute saddle PE.  He is on a heparin drip.  The patient was previously on Xarelto for his history of PE, but was not taking it on a routine basis.  He would stop it when he developed rectal bleeding and then resume it intermittently.  I had a discussion with the patient today regarding long-term anticoagulation upon discharge.  Since he was not taking Xarelto as ordered, he did not fail Xarelto.  We could consider starting him back on Xarelto with him taking it on a regular basis versus changing him to Lovenox.  He will be seen by Dr. Burr Medico later today and have a more detailed discussion about this. -The patient was noted to have disease progression on his CT scan.  Will likely need to change his chemotherapy. -The patient has had ongoing rectal bleeding.  This is consistent with his baseline.  Monitor closely. -Hemoglobin remained stable.  No transfusion is indicated. -Continue medical management of hypertension per hospitalist.   LOS: 0 days   Mikey Bussing, DNP, AGPCNP-BC, AOCNP 06/01/19   Addendum  I have seen the patient, examined him. I agree with the assessment and and plan and have edited the notes.   Pt is clinically stable, tolerating iv heparin well, with minimum rectal bleeding. If he is clinically stable, he can be discharged home tomorrow evening or Saturday morning.   I discussed his cancer  treatment, will change to IV FOLFOXFIRI due to disease progression in liver.  He is concerned about neuropathy, I will reduce his oxaliplatin dose.  We will continue vectibix.  I did tell patient to continue Braftovi and Mektovi for now and let his wife bring them in.  However I noticed his QT is currently prolonged more than 500, which will be contraindication for Braftovi, so I will hold these meds for now. Please repeat EKG tomorrow.   I recommend lovenox 173m q12h on discharge, he is agreeable, I have called in to his pharmacy yesterday.   I will schedule his f/u and chemo next week.  I called his wife to update her twice, was not able to reach her, and left a detailed message.  I also discussed with Dr. CMaylene Roes   YTruitt Merle 06/01/2019

## 2019-06-01 NOTE — Progress Notes (Signed)
ANTICOAGULATION CONSULT NOTE  Pharmacy Consult for Heparin Indication: acute saddle PE and LLE DVT  No Known Allergies  Patient Measurements: Height: 6\' 1"  (185.4 cm) Weight: 227 lb (103 kg) IBW/kg (Calculated) : 79.9 HEPARIN DW (KG): 100.8   Vital Signs: Temp: 99.1 F (37.3 C) (08/27 1335) Temp Source: Oral (08/27 1335) BP: 134/84 (08/27 1335) Pulse Rate: 84 (08/27 1335)  Labs: Recent Labs    05/31/19 1030 05/31/19 1856 05/31/19 2056 06/01/19 0438 06/01/19 1506  HGB 11.0* 10.5*  --  9.4*  --   HCT 35.4* 33.6*  --  30.6*  --   PLT 261 257  --  201  --   LABPROT  --  15.1  --   --   --   INR  --  1.2  --   --   --   HEPARINUNFRC  --   --   --  0.84* 0.45  CREATININE 1.15 1.27*  --   --   --   TROPONINIHS  --  5 5  --   --     Estimated Creatinine Clearance: 73.1 mL/min (A) (by C-G formula based on SCr of 1.27 mg/dL (H)).   Medical History: Past Medical History:  Diagnosis Date  . colorectal ca dx'd 07/2018  . Liver metastases ([place]) dx'd 07/2018  . Lung metastases ([place]) dx'd 07/2018    Medications:  Started on Lovenox 100mg  SQ BID at [facility] visit 8/26- only received x 1 dose at 1300   Assessment: 68 yo M with PMH of metastatic rectal CA. Patient states he had a blood clot previously for which he is supposed to be on Xarelto but has not been compliant with this medication due to episodic bleeding from his colonic stent which is worse when he uses Xarelto. Patient was diagnosed with saddle PE on CT at oncology visit on 8/26. He refused hospital admission so was started on Lovenox SQ BID. He returned to ED 8/26 afternoon and was subsequently admitted. Pharmacy consulted to start IV heparin. Venous doppler also + for LLE DVT.  1506 heparin level = 0.45 units/mL, now therapeutic CBC: Hgb decreased to 9.4, Pltc WNL No infusion issues noted per nursing FOBT+ (MD aware)  Goal of Therapy:  Heparin level 0.3-0.5 units/ml (target lower end of range per MD due to FOBT+,  intermittent bleeding from colonic stent) Monitor platelets by anticoagulation protocol: Yes   Plan:   Continue heparin infusion at 1550 units/hr  Heparin level at 2200  Daily heparin level & CBC while on heparin  Monitor closely for further bleeding issues    Lindell Spar, PharmD, BCPS Clinical Pharmacist  06/01/2019,3:46 PM

## 2019-06-01 NOTE — Progress Notes (Signed)
Pt had high BP over night. RN paged on call with no response. Pt is asymptomatic. Will recheck BP before day shift arrives. Will continue to monitor the pt.

## 2019-06-01 NOTE — Progress Notes (Signed)
PROGRESS NOTE    Billy Mendoza  [id number] DOB: 05/01/1951 DOA: 05/31/2019 PCP: Merry Proud, MD     Brief Narrative:  Billy Mendoza is a 68 y.o. male with medical history significant of metastatic rectal cancer being sent to the hospital by oncology Dr. Burr Medico for management of a saddle PE found on CT.  Patient was given Lovenox 100 mg subcutaneous in the office.  Patient reports 2-week history of slight dyspnea on exertion.  Denies any fevers, chills, chest pain, or cough.  States he had a blood clot previously for which he is supposed to be on Xarelto but has not been compliant with this medication due to episodic bleeding from his colonic stent which is worse when he uses Xarelto.    He was admitted and started on IV heparin.  New events last 24 hours / Subjective: States that he is feeling well overall.  Has some exertional shortness of breath but otherwise feeling well.  He is on room air.  Denies shortness of breath at rest, no chest pain.  Assessment & Plan:   Principal Problem:   Acute pulmonary embolism (HCC) Active Problems:   Rectal cancer metastasized to liver (HCC)   QT prolongation   Acute saddle PE and LLE DVT -Patient with previous history of blood clot, also with metastatic rectal cancer.  Has been noncompliant with Xarelto in the past due to episodic bleeding from his colonic stent -Currently on room air -On IV heparin, plan to transition to DOAC vs lovenox prior to discharge -Echocardiogram revealed EF 55-60%, RV with normal systolic function, no increase in wall thickness or increase in systolic pressure  -Venous Doppler revealed left DVT   Metastatic rectal cancer -Follows with Dr. Burr Medico  -Intermittent bleeding from colonic stent, hemoglobin remained stable at this time.  FOBT positive  Prolonged QTC -Monitor on telemetry.  Avoid QT prolonging drugs   DVT prophylaxis: IV heparin Code Status: Full code Family Communication: None Disposition Plan:  Pending further work-up and stabilization.  Remains on IV heparin at this point.  Echocardiogram is pending.  Will need to monitor his intermittent colonic stent bleeding while on anticoagulation.   Consultants:   Oncology  Procedures:   None  Antimicrobials:  Anti-infectives (From admission, onward)   None        Objective: Vitals:   05/31/19 2143 06/01/19 0101 06/01/19 0456 06/01/19 0642  BP: (!) 129/102 (!) 141/97 (!) 135/102 (!) 131/95  Pulse: 82  72 70  Resp: 20     Temp: 98.6 F (37 C)  98.3 F (36.8 C)   TempSrc: Oral  Oral   SpO2: 98%  99%   Weight:      Height:        Intake/Output Summary (Last 24 hours) at 06/01/2019 1300 Last data filed at 06/01/2019 0600 Gross per 24 hour  Intake 434.68 ml  Output --  Net 434.68 ml   Filed Weights   05/31/19 1837 05/31/19 2138  Weight: 101.6 kg 103 kg    Examination:  General exam: Appears calm and comfortable  Respiratory system: Clear to auscultation. Respiratory effort normal. No respiratory distress. No conversational dyspnea.  Cardiovascular system: S1 & S2 heard, RRR. No murmurs. No pedal edema. Gastrointestinal system: Abdomen is nondistended, soft and nontender. Normal bowel sounds heard. Central nervous system: Alert and oriented. No focal neurological deficits. Speech clear.  Extremities: Symmetric in appearance  Skin: No rashes, lesions or ulcers on exposed skin  Psychiatry: Judgement and insight appear normal. Mood &  affect appropriate.   Data Reviewed: I have personally reviewed following labs and imaging studies  CBC: Recent Labs  Lab 05/31/19 1030 05/31/19 1856 06/01/19 0438  WBC 12.0* 10.2 7.2  NEUTROABS 8.8* 7.1  --   HGB 11.0* 10.5* 9.4*  HCT 35.4* 33.6* 30.6*  MCV 95.4 97.4 99.0  PLT 261 257 [id number]   Basic Metabolic Panel: Recent Labs  Lab 05/31/19 1030 05/31/19 1856 05/31/19 2056 06/01/19 0438  NA 139 138  --   --   K 4.1 3.8  --   --   CL 107 107  --   --   CO2 19* 18*  --    --   GLUCOSE 117* 118*  --   --   BUN 17 19  --   --   CREATININE 1.15 1.27*  --   --   CALCIUM 8.8* 8.4*  --   --   MG 1.5*  --  1.5* 2.1   GFR: Estimated Creatinine Clearance: 73.1 mL/min (A) (by C-G formula based on SCr of 1.27 mg/dL (H)). Liver Function Tests: Recent Labs  Lab 05/31/19 1030 05/31/19 1856  AST 18 21  ALT 15 16  ALKPHOS 325* 261*  BILITOT 0.4 0.7  PROT 6.9 6.8  ALBUMIN 2.5* 2.7*   No results for input(s): LIPASE, AMYLASE in the last 168 hours. No results for input(s): AMMONIA in the last 168 hours. Coagulation Profile: Recent Labs  Lab 05/31/19 1856  INR 1.2   Cardiac Enzymes: No results for input(s): CKTOTAL, CKMB, CKMBINDEX, TROPONINI in the last 168 hours. BNP (last 3 results) No results for input(s): PROBNP in the last 8760 hours. HbA1C: No results for input(s): HGBA1C in the last 72 hours. CBG: No results for input(s): GLUCAP in the last 168 hours. Lipid Profile: No results for input(s): CHOL, HDL, LDLCALC, TRIG, CHOLHDL, LDLDIRECT in the last 72 hours. Thyroid Function Tests: No results for input(s): TSH, T4TOTAL, FREET4, T3FREE, THYROIDAB in the last 72 hours. Anemia Panel: Recent Labs    05/31/19 1030  FERRITIN 82   Sepsis Labs: No results for input(s): PROCALCITON, LATICACIDVEN in the last 168 hours.  Recent Results (from the past 240 hour(s))  SARS Coronavirus 2 [facility] order, Performed in [facility] hospital lab) Nasopharyngeal Nasopharyngeal Swab     Status: None   Collection Time: 05/31/19  8:17 PM   Specimen: Nasopharyngeal Swab  Result Value Ref Range Status   SARS Coronavirus 2 NEGATIVE NEGATIVE Final    Comment: (NOTE) If result is NEGATIVE SARS-CoV-2 target nucleic acids are NOT DETECTED. The SARS-CoV-2 RNA is generally detectable in upper and lower  respiratory specimens during the acute phase of infection. The lowest  concentration of SARS-CoV-2 viral copies this assay can detect is 250  copies / mL. A negative  result does not preclude SARS-CoV-2 infection  and should not be used as the sole basis for treatment or other  patient management decisions.  A negative result may occur with  improper specimen collection / handling, submission of specimen other  than nasopharyngeal swab, presence of viral mutation(s) within the  areas targeted by this assay, and inadequate number of viral copies  (<250 copies / mL). A negative result must be combined with clinical  observations, patient history, and epidemiological information. If result is POSITIVE SARS-CoV-2 target nucleic acids are DETECTED. The SARS-CoV-2 RNA is generally detectable in upper and lower  respiratory specimens dur ing the acute phase of infection.  Positive  results are indicative of active  infection with SARS-CoV-2.  Clinical  correlation with patient history and other diagnostic information is  necessary to determine patient infection status.  Positive results do  not rule out bacterial infection or co-infection with other viruses. If result is PRESUMPTIVE POSTIVE SARS-CoV-2 nucleic acids MAY BE PRESENT.   A presumptive positive result was obtained on the submitted specimen  and confirmed on repeat testing.  While 2019 novel coronavirus  (SARS-CoV-2) nucleic acids may be present in the submitted sample  additional confirmatory testing may be necessary for epidemiological  and / or clinical management purposes  to differentiate between  SARS-CoV-2 and other Sarbecovirus currently known to infect humans.  If clinically indicated additional testing with an alternate test  methodology [id number]) is advised. The SARS-CoV-2 RNA is generally  detectable in upper and lower respiratory sp ecimens during the acute  phase of infection. The expected result is Negative. Fact Sheet for Patients:  StrictlyIdeas.no Fact Sheet for Healthcare Providers: BankingDealers.co.za This test is not yet  approved or cleared by the [place] FDA and has been authorized for detection and/or diagnosis of SARS-CoV-2 by FDA under an Emergency Use Authorization (EUA).  This EUA will remain in effect (meaning this test can be used) for the duration of the COVID-19 declaration under Section 564(b)(1) of the Act, 21 U.S.C. section 360bbb-3(b)(1), unless the authorization is terminated or revoked sooner. Performed at [facility], [place] [address] [place], [place] [address]       Radiology Studies: Ct Chest W Contrast  Result Date: 05/31/2019 CLINICAL DATA:  Colorectal cancer staging with liver and lung metastases. Nausea and vomiting. Radiation therapy complete in June. Ongoing chemotherapy. EXAM: CT CHEST, ABDOMEN, AND PELVIS WITH CONTRAST TECHNIQUE: Multidetector CT imaging of the chest, abdomen and pelvis was performed following the standard protocol during bolus administration of intravenous contrast. CONTRAST:  155mL OMNIPAQUE IOHEXOL 300 MG/ML  SOLN COMPARISON:  CT chest 02/13/2019 and CT abdomen pelvis 01/18/2019. FINDINGS: CT CHEST FINDINGS Cardiovascular: Although the study was not performed to optimize for the detection of pulmonary emboli, there is a saddle embolus at the bifurcation of the right and left pulmonary arteries, with filling defects in the lobar pulmonary arteries bilaterally. Right IJ Port-A-Cath terminates in the low SVC, near the SVC RA junction. Atherosclerotic calcification of the aorta and coronary arteries. Heart size normal. No pericardial effusion. Mediastinum/Nodes: Lymph node anterior to the proximal left pulmonary artery measures 11 mm (2/28), increased from 4 mm. No additional pathologically enlarged mediastinal, hilar or axillary lymph nodes. Esophagus is unremarkable. Lungs/Pleura: Some pulmonary nodules have improved in the interval. For example, right middle lobe nodules have nearly completely resolved (6/96 and 100). Others have enlarged. Index  nodule in the peripheral right lower lobe measures 12 mm (6/107), previously 5 mm. An 8 mm nodule in the apical left upper lobe (6/46) has also increased in size from 4 mm. No pleural fluid. Airway is unremarkable. Musculoskeletal: No worrisome lytic or sclerotic lesions. CT ABDOMEN PELVIS FINDINGS Hepatobiliary: Dominant heterogeneous low-attenuation lesion in the dome of the liver is similar in size, measuring 3.6 x 4.0 cm (2/42), previously 2.9 x 4.3 cm. Other lesions have increased in size, however. A 3.6 cm heterogeneous lesion slightly more inferiorly within the right hepatic lobe (2/48) is larger, previously measuring 1.6 cm. Index lesion in the left hepatic lobe measures 3.1 cm (2/53), previously 1.7 cm. Gallbladder is unremarkable. No biliary ductal dilatation. Pancreas: Negative. Spleen: Negative. Adrenals/Urinary Tract: Adrenal glands are unremarkable. Low-attenuation lesions in the  kidneys measure up to 3.2 cm on the left and are likely cysts. Kidneys are otherwise unremarkable. Ureters are decompressed. Bladder is unremarkable. Stomach/Bowel: Stomach, small bowel, appendix and majority of the colon are unremarkable. A metallic stent is seen in the rectosigmoid colon, where there is wall thickening and luminal narrowing. Adjacent soft tissue nodule along the left lateral margin of the rectosigmoid colon is slightly larger, measuring 1.5 cm (2/122), previously 12 mm. A fair amount of stool in the colon is indicative of constipation. Vascular/Lymphatic: Atherosclerotic calcification of the aorta without aneurysm. Periportal lymph node measures 1.1 cm (2/59), increased from 8 mm. A new low aortocaval lymph node measures 8 mm (2/79). Reproductive: Prostate is visualized. Other: Presacral edema is similar. Previously mentioned left perirectal soft tissue nodule appears slightly more prominent. There is a new nodule in the right perirectal fat, measuring 6 mm (2/116). Omental nodules in the right lateral  abdomen measure up to 1.4 cm (2/69), slightly decreased in size from 1.6 cm. Small bilateral inguinal hernias contain fat. Small umbilical hernia contains fat. Musculoskeletal: No worrisome lytic or sclerotic lesions. IMPRESSION: 1. Pulmonary emboli with a saddle embolus. Critical Value/emergent results were called by telephone at the time of interpretation on 05/31/2019 at 12:03 pm to Cira Rue, NP , who verbally acknowledged these results. 2. Slight increase in perirectal soft tissue nodularity with overall worsening in pulmonary and hepatic metastatic disease. Omental nodularity appears slightly improved. 3. New mediastinal and periportal adenopathy. Aortocaval lymph node is not enlarged by CT size criteria but is new and therefore considered suspicious for metastatic involvement. 4. Aortic atherosclerosis (ICD10-170.0). Coronary artery calcification. Electronically Signed   By: Lorin Picket M.D.   On: 05/31/2019 12:30   Ct Abdomen Pelvis W Contrast  Result Date: 05/31/2019 CLINICAL DATA:  Colorectal cancer staging with liver and lung metastases. Nausea and vomiting. Radiation therapy complete in June. Ongoing chemotherapy. EXAM: CT CHEST, ABDOMEN, AND PELVIS WITH CONTRAST TECHNIQUE: Multidetector CT imaging of the chest, abdomen and pelvis was performed following the standard protocol during bolus administration of intravenous contrast. CONTRAST:  179mL OMNIPAQUE IOHEXOL 300 MG/ML  SOLN COMPARISON:  CT chest 02/13/2019 and CT abdomen pelvis 01/18/2019. FINDINGS: CT CHEST FINDINGS Cardiovascular: Although the study was not performed to optimize for the detection of pulmonary emboli, there is a saddle embolus at the bifurcation of the right and left pulmonary arteries, with filling defects in the lobar pulmonary arteries bilaterally. Right IJ Port-A-Cath terminates in the low SVC, near the SVC RA junction. Atherosclerotic calcification of the aorta and coronary arteries. Heart size normal. No pericardial  effusion. Mediastinum/Nodes: Lymph node anterior to the proximal left pulmonary artery measures 11 mm (2/28), increased from 4 mm. No additional pathologically enlarged mediastinal, hilar or axillary lymph nodes. Esophagus is unremarkable. Lungs/Pleura: Some pulmonary nodules have improved in the interval. For example, right middle lobe nodules have nearly completely resolved (6/96 and 100). Others have enlarged. Index nodule in the peripheral right lower lobe measures 12 mm (6/107), previously 5 mm. An 8 mm nodule in the apical left upper lobe (6/46) has also increased in size from 4 mm. No pleural fluid. Airway is unremarkable. Musculoskeletal: No worrisome lytic or sclerotic lesions. CT ABDOMEN PELVIS FINDINGS Hepatobiliary: Dominant heterogeneous low-attenuation lesion in the dome of the liver is similar in size, measuring 3.6 x 4.0 cm (2/42), previously 2.9 x 4.3 cm. Other lesions have increased in size, however. A 3.6 cm heterogeneous lesion slightly more inferiorly within the right hepatic lobe (2/48) is  larger, previously measuring 1.6 cm. Index lesion in the left hepatic lobe measures 3.1 cm (2/53), previously 1.7 cm. Gallbladder is unremarkable. No biliary ductal dilatation. Pancreas: Negative. Spleen: Negative. Adrenals/Urinary Tract: Adrenal glands are unremarkable. Low-attenuation lesions in the kidneys measure up to 3.2 cm on the left and are likely cysts. Kidneys are otherwise unremarkable. Ureters are decompressed. Bladder is unremarkable. Stomach/Bowel: Stomach, small bowel, appendix and majority of the colon are unremarkable. A metallic stent is seen in the rectosigmoid colon, where there is wall thickening and luminal narrowing. Adjacent soft tissue nodule along the left lateral margin of the rectosigmoid colon is slightly larger, measuring 1.5 cm (2/122), previously 12 mm. A fair amount of stool in the colon is indicative of constipation. Vascular/Lymphatic: Atherosclerotic calcification of the  aorta without aneurysm. Periportal lymph node measures 1.1 cm (2/59), increased from 8 mm. A new low aortocaval lymph node measures 8 mm (2/79). Reproductive: Prostate is visualized. Other: Presacral edema is similar. Previously mentioned left perirectal soft tissue nodule appears slightly more prominent. There is a new nodule in the right perirectal fat, measuring 6 mm (2/116). Omental nodules in the right lateral abdomen measure up to 1.4 cm (2/69), slightly decreased in size from 1.6 cm. Small bilateral inguinal hernias contain fat. Small umbilical hernia contains fat. Musculoskeletal: No worrisome lytic or sclerotic lesions. IMPRESSION: 1. Pulmonary emboli with a saddle embolus. Critical Value/emergent results were called by telephone at the time of interpretation on 05/31/2019 at 12:03 pm to Cira Rue, NP , who verbally acknowledged these results. 2. Slight increase in perirectal soft tissue nodularity with overall worsening in pulmonary and hepatic metastatic disease. Omental nodularity appears slightly improved. 3. New mediastinal and periportal adenopathy. Aortocaval lymph node is not enlarged by CT size criteria but is new and therefore considered suspicious for metastatic involvement. 4. Aortic atherosclerosis (ICD10-170.0). Coronary artery calcification. Electronically Signed   By: Lorin Picket M.D.   On: 05/31/2019 12:30   Vas [place] Lower Extremity Venous (dvt)  Result Date: 06/01/2019  Lower Venous Study Indications: Pulmonary embolism.  Risk Factors: Colorectal cancer with lung mets. Comparison Study: no prior Performing Technologist: June Leap RDMS, RVT  Examination Guidelines: A complete evaluation includes B-mode imaging, spectral Doppler, color Doppler, and power Doppler as needed of all accessible portions of each vessel. Bilateral testing is considered an integral part of a complete examination. Limited examinations for reoccurring indications may be performed as noted.   +---------+---------------+---------+-----------+----------+--------------+  RIGHT     Compressibility Phasicity Spontaneity Properties Thrombus Aging  +---------+---------------+---------+-----------+----------+--------------+  CFV       Full            Yes       Yes                                    +---------+---------------+---------+-----------+----------+--------------+  SFJ       Full                                                             +---------+---------------+---------+-----------+----------+--------------+  FV Prox   Full                                                             +---------+---------------+---------+-----------+----------+--------------+  FV Mid    Full                                                             +---------+---------------+---------+-----------+----------+--------------+  FV Distal Full                                                             +---------+---------------+---------+-----------+----------+--------------+  PFV       Full                                                             +---------+---------------+---------+-----------+----------+--------------+  POP       Full            Yes       Yes                                    +---------+---------------+---------+-----------+----------+--------------+  PTV       Full                                                             +---------+---------------+---------+-----------+----------+--------------+  PERO      Full                                                             +---------+---------------+---------+-----------+----------+--------------+   +---------------+---------------+---------+-----------+----------+-------------+  LEFT            Compressibility Phasicity Spontaneity Properties Thrombus                                                                         Aging          +---------------+---------------+---------+-----------+----------+-------------+  CFV             None             No        No                                    +---------------+---------------+---------+-----------+----------+-------------+  SFJ             None                                                            +---------------+---------------+---------+-----------+----------+-------------+  FV Prox         None                                                            +---------------+---------------+---------+-----------+----------+-------------+  FV Mid          None                                                            +---------------+---------------+---------+-----------+----------+-------------+  FV Distal       None                                                            +---------------+---------------+---------+-----------+----------+-------------+  PFV             None                                                            +---------------+---------------+---------+-----------+----------+-------------+  POP             None            No        No                                    +---------------+---------------+---------+-----------+----------+-------------+  PTV             Partial                                                         +---------------+---------------+---------+-----------+----------+-------------+  PERO                                                             Not                                                                              visualized     +---------------+---------------+---------+-----------+----------+-------------+  Ex Iliac  Yes       Yes                    patent          mid-dist                                                                        +---------------+---------------+---------+-----------+----------+-------------+     Summary: Right: There is no evidence of deep vein thrombosis in the lower extremity. No cystic structure found in the popliteal fossa. Left: Findings consistent with acute deep vein  thrombosis involving the left common femoral vein, left femoral vein, left proximal profunda vein, left popliteal vein, and left posterior tibial veins. No cystic structure found in the popliteal fossa.  *See table(s) above for measurements and observations.    Preliminary       Scheduled Meds: Continuous Infusions:  heparin 1,550 Units/hr (06/01/19 1231)     LOS: 0 days      Time spent: 40 minutes   Dessa Phi, DO [facility] [email or web address] 06/01/2019, 1:00 PM

## 2019-06-01 NOTE — Progress Notes (Signed)
Echocardiogram 2D Echocardiogram has been performed.  Billy Mendoza 06/01/2019, 8:38 AM

## 2019-06-01 NOTE — Progress Notes (Signed)
ANTICOAGULATION CONSULT NOTE - Follow Up Consult  Pharmacy Consult for Heparin Indication: pulmonary embolus  No Known Allergies  Patient Measurements: Height: 6\' 1"  (185.4 cm) Weight: 227 lb (103 kg) IBW/kg (Calculated) : 79.9 Heparin Dosing Weight:   Vital Signs: Temp: 98.3 F (36.8 C) (08/27 0456) Temp Source: Oral (08/27 0456) BP: 135/102 (08/27 0456) Pulse Rate: 72 (08/27 0456)  Labs: Recent Labs    05/31/19 1030 05/31/19 1856 05/31/19 2056 06/01/19 0438  HGB 11.0* 10.5*  --  9.4*  HCT 35.4* 33.6*  --  30.6*  PLT 261 257  --  201  LABPROT  --  15.1  --   --   INR  --  1.2  --   --   HEPARINUNFRC  --   --   --  0.84*  CREATININE 1.15 1.27*  --   --   TROPONINIHS  --  5 5  --     Estimated Creatinine Clearance: 73.1 mL/min (A) (by C-G formula based on SCr of 1.27 mg/dL (H)).   Medications:  Infusions:  . heparin 1,600 Units/hr (05/31/19 2055)    Assessment: Patient with high heparin level.  RN reports that patient reported some blood in stool, but patient flushed before RN could evaluate.  Patient seemed unconcerned and told RN that this happened in past prior to heparin therapy as well.  Also note prior enoxaparin ~12hr prior to level drawn.  Goal of Therapy:  Heparin level 0.3-0.7 units/ml Monitor platelets by anticoagulation protocol: Yes   Plan:  Decrease heparin slightly to 1550 units/hr Recheck level at [place], [place] 06/01/2019,5:23 AM

## 2019-06-01 NOTE — Progress Notes (Addendum)
LE venous duplex       has been completed. Preliminary results can be found under CV proc through chart review. June Leap, BS, RDMS, RVT  Gave positive results to [place], [place]

## 2019-06-02 ENCOUNTER — Telehealth: Payer: Self-pay | Admitting: Hematology

## 2019-06-02 ENCOUNTER — Other Ambulatory Visit: Payer: Self-pay

## 2019-06-02 LAB — BASIC METABOLIC PANEL
Anion gap: 9 (ref 5–15)
BUN: 10 mg/dL (ref 8–23)
CO2: 22 mmol/L (ref 22–32)
Calcium: 7.7 mg/dL — ABNORMAL LOW (ref 8.9–10.3)
Chloride: 110 mmol/L (ref 98–111)
Creatinine, Ser: 0.96 mg/dL (ref 0.61–1.24)
GFR calc Af Amer: >60 mL/min (ref >60)
GFR calc non Af Amer: >60 mL/min (ref >60)
Glucose, Bld: 95 mg/dL (ref 70–99)
Potassium: 3.3 mmol/L — ABNORMAL LOW (ref 3.5–5.1)
Sodium: 141 mmol/L (ref 135–145)

## 2019-06-02 LAB — CBC
HCT: 27.7 % — ABNORMAL LOW (ref 39.0–52.0)
Hemoglobin: 8.5 g/dL — ABNORMAL LOW (ref 13.0–17.0)
MCH: 30.4 pg (ref 26.0–34.0)
MCHC: 30.7 g/dL (ref 30.0–36.0)
MCV: 98.9 fL (ref 80.0–100.0)
Platelets: 207 10*3/uL (ref 150–400)
RBC: 2.8 MIL/uL — ABNORMAL LOW (ref 4.22–5.81)
RDW: 16.4 % — ABNORMAL HIGH (ref 11.5–15.5)
WBC: 5.6 10*3/uL (ref 4.0–10.5)
nRBC: 0 % (ref 0.0–0.2)

## 2019-06-02 LAB — HEPARIN LEVEL (UNFRACTIONATED): Heparin Unfractionated: 0.41 IU/mL (ref 0.30–0.70)

## 2019-06-02 MED ORDER — ENOXAPARIN (LOVENOX) PATIENT EDUCATION KIT
PACK | Freq: Once | Status: AC
  Administered 2019-06-02: 13:00:00
  Filled 2019-06-02: qty 1

## 2019-06-02 MED ORDER — HEPARIN SOD (PORK) LOCK FLUSH 100 UNIT/ML IV SOLN
500.0000 [IU] | INTRAVENOUS | Status: AC | PRN
  Administered 2019-06-02: 13:00:00 500 [IU]

## 2019-06-02 MED ORDER — POTASSIUM CHLORIDE CRYS ER 20 MEQ PO TBCR
40.0000 meq | EXTENDED_RELEASE_TABLET | Freq: Once | ORAL | Status: AC
  Administered 2019-06-02: 09:00:00 40 meq via ORAL
  Filled 2019-06-02: qty 2

## 2019-06-02 MED ORDER — ENOXAPARIN SODIUM 100 MG/ML ~~LOC~~ SOLN
100.0000 mg | Freq: Two times a day (BID) | SUBCUTANEOUS | Status: DC
  Administered 2019-06-02: 100 mg via SUBCUTANEOUS
  Filled 2019-06-02: qty 1

## 2019-06-02 NOTE — Progress Notes (Signed)
ANTICOAGULATION CONSULT NOTE - Follow Up Consult  Pharmacy Consult for Heparin Indication: acute saddle PE and LLE DVT  No Known Allergies  Patient Measurements: Height: 6\' 1"  (185.4 cm) Weight: 227 lb (103 kg) IBW/kg (Calculated) : 79.9 Heparin Dosing Weight:   Vital Signs: Temp: 99.6 F (37.6 C) (08/27 2102) Temp Source: Oral (08/27 2102) BP: 149/97 (08/27 2102) Pulse Rate: 77 (08/27 2102)  Labs: Recent Labs    05/31/19 1030 05/31/19 1856 05/31/19 2056 06/01/19 0438 06/01/19 1506 06/01/19 2254  HGB 11.0* 10.5*  --  9.4*  --   --   HCT 35.4* 33.6*  --  30.6*  --   --   PLT 261 257  --  201  --   --   LABPROT  --  15.1  --   --   --   --   INR  --  1.2  --   --   --   --   HEPARINUNFRC  --   --   --  0.84* 0.45 0.46  CREATININE 1.15 1.27*  --   --   --   --   TROPONINIHS  --  5 5  --   --   --     Estimated Creatinine Clearance: 73.1 mL/min (A) (by C-G formula based on SCr of 1.27 mg/dL (H)).   Medications:  Infusions:  . heparin 1,550 Units/hr (06/01/19 1231)    Assessment: Patient with heparin level at goal again.  No heparin issues noted.  Goal of Therapy:  Heparin level 0.3-0.5 units/ml Monitor platelets by anticoagulation protocol: Yes   Plan:  Continue heparin drip at current rate Recheck level with 8/29 AM labs  Billy Mendoza, Billy Mendoza 06/02/2019,1:48 AM

## 2019-06-02 NOTE — Discharge Summary (Signed)
Physician Discharge Summary  Billy Mendoza [id number] DOB: [birth year] DOA: 05/31/2019  PCP: Merry Proud, MD  Admit date: 05/31/2019 Discharge date: 06/02/2019  Admitted From: Home Disposition:  Home  Recommendations for Outpatient Follow-up:  1. Follow up with PCP in 1 week 2. Follow up with Dr. Burr Medico next week 3. Monitor H&H intermittently while on lovenox  4. Hold Braftovi and Mektovi for now until follow up with Dr. Burr Medico  Discharge Condition: Stable CODE STATUS: Full  Diet recommendation: Heart healthy   Brief/Interim Summary: Billy Mendoza a 68 y.o.malewith medical history significant ofmetastatic rectal cancer being sent to the hospital by oncology Dr.Feng for management of a saddle PE found on CT. Patient was given Lovenox 100 mg subcutaneous in the office.Patient reports 2-week history of slight dyspnea on exertion. Denies any fevers, chills, chest pain, or cough. States he had a blood clot previously for which he is supposed to be on Xarelto but has not been compliant with this medication due to episodic bleeding from his colonic stent which is worse when he uses Xarelto.   He was admitted and started on IV heparin.  Doppler ultrasound revealed left lower extremity DVT.  Patient was treated with IV heparin, transition to Lovenox on discharge.  Discharge Diagnoses:  Principal Problem:   Acute pulmonary embolism ([place]) Active Problems:   Rectal cancer metastasized to liver (HCC)   QT prolongation   Pulmonary embolism (HCC)   Acute saddle PE and LLE DVT -Patient with previous history of blood clot, also with metastatic rectal cancer.  Has been noncompliant with Xarelto in the past due to episodic bleeding from his colonic stent -Currently on room air, HR normal  -Echocardiogram revealed EF 55-60%, RV with normal systolic function, no increase in wall thickness or increase in systolic pressure  -Venous Doppler revealed left DVT  -Lovenox 100 mg every  12h  Metastatic rectal cancer -Follows with Dr. Burr Medico  -Intermittent bleeding from colonic stent.  FOBT positive  Prolonged QTC -Monitor on telemetry.  Avoid QT prolonging drugs -EKG repeated this morning, reviewed personally.  Normal sinus rhythm rate 74, QTc 461.    Hypokalemia -Replace, trend    Discharge Instructions  Discharge Instructions    Call MD for:  difficulty breathing, headache or visual disturbances   Complete by: As directed    Call MD for:  extreme fatigue   Complete by: As directed    Call MD for:  persistant dizziness or light-headedness   Complete by: As directed    Call MD for:  persistant nausea and vomiting   Complete by: As directed    Call MD for:  severe uncontrolled pain   Complete by: As directed    Call MD for:  temperature >100.4   Complete by: As directed    Diet - low sodium heart healthy   Complete by: As directed    Discharge instructions   Complete by: As directed    You were cared for by a hospitalist during your hospital stay. If you have any questions about your discharge medications or the care you received while you were in the hospital after you are discharged, you can [facility] the unit and ask to speak with the hospitalist on [facility]za if the hospitalist that took care of you is not available. Once you are discharged, your primary care physician will handle any further medical issues. Please note that NO REFILLS for any discharge medications will be authorized once you are discharged, as it is imperative that you return  to your primary care physician (or establish a relationship with a primary care physician if you do not have one) for your aftercare needs so that they can reassess your need for medications and monitor your lab values.   Increase activity slowly   Complete by: As directed      Allergies as of 06/02/2019   No Known Allergies     Medication List    STOP taking these medications   Binimetinib 15 MG Tabs Commonly known as:  Mektovi   clindamycin 1 % gel Commonly known as: Clindagel   encorafenib 75 MG capsule Commonly known as: Braftovi   potassium chloride SA 20 MEQ tablet Commonly known as: K-DUR   Xarelto 15 MG Tabs tablet Generic drug: Rivaroxaban     TAKE these medications   acetaminophen 325 MG tablet Commonly known as: TYLENOL Take 650 mg by mouth every 6 (six) hours as needed for moderate pain.   diphenoxylate-atropine 2.5-0.025 MG tablet Commonly known as: LOMOTIL Take 1-2 tablets by mouth 4 (four) times daily as needed for diarrhea or loose stools. Up to 8 tabs daily for severe diarrhea   doxycycline 100 MG tablet Commonly known as: VIBRA-TABS TAKE 1 TABLET(100 MG) BY MOUTH TWICE DAILY What changed: See the new instructions.   enoxaparin 100 MG/ML injection Commonly known as: LOVENOX Inject 1 mL (100 mg total) into the skin every 12 (twelve) hours.   MAGnesium-Oxide 400 (241.3 Mg) MG tablet Generic drug: magnesium oxide TAKE 1 TABLET BY MOUTH DAILY What changed:   how much to take  when to take this   morphine 15 MG 12 hr tablet Commonly known as: MS CONTIN Take 1 tablet (15 mg total) by mouth every 12 (twelve) hours. What changed:   when to take this  reasons to take this   oxyCODONE 5 MG immediate release tablet Commonly known as: Oxy IR/ROXICODONE Take 1-2 tablets (5-10 mg total) by mouth every 4 (four) hours as needed for severe pain.   traMADol 50 MG tablet Commonly known as: ULTRAM Take 1 tablet (50 mg total) by mouth every 6 (six) hours as needed. What changed: reasons to take this   VITAMIN E PO Take 1 tablet by mouth daily.      Follow-up Information    Truitt Merle, MD. Mendoza.   Specialties: Hematology, Oncology Contact information: [place] [address] [phone]          No Known Allergies  Consultations:  Oncology    Procedures/Studies: Ct Chest W Contrast  Result Date: 05/31/2019 CLINICAL DATA:   Colorectal cancer staging with liver and lung metastases. Nausea and vomiting. Radiation therapy complete in June. Ongoing chemotherapy. EXAM: CT CHEST, ABDOMEN, AND PELVIS WITH CONTRAST TECHNIQUE: Multidetector CT imaging of the chest, abdomen and pelvis was performed following the standard protocol during bolus administration of intravenous contrast. CONTRAST:  160mL OMNIPAQUE IOHEXOL 300 MG/ML  SOLN COMPARISON:  CT chest 02/13/2019 and CT abdomen pelvis 01/18/2019. FINDINGS: CT CHEST FINDINGS Cardiovascular: Although the study was not performed to optimize for the detection of pulmonary emboli, there is a saddle embolus at the bifurcation of the right and left pulmonary arteries, with filling defects in the lobar pulmonary arteries bilaterally. Right IJ Port-A-Cath terminates in the low SVC, near the SVC RA junction. Atherosclerotic calcification of the aorta and coronary arteries. Heart size normal. No pericardial effusion. Mediastinum/Nodes: Lymph node anterior to the proximal left pulmonary artery measures 11 mm (2/28), increased from 4 mm. No additional pathologically  enlarged mediastinal, hilar or axillary lymph nodes. Esophagus is unremarkable. Lungs/Pleura: Some pulmonary nodules have improved in the interval. For example, right middle lobe nodules have nearly completely resolved (6/96 and 100). Others have enlarged. Index nodule in the peripheral right lower lobe measures 12 mm (6/107), previously 5 mm. An 8 mm nodule in the apical left upper lobe (6/46) has also increased in size from 4 mm. No pleural fluid. Airway is unremarkable. Musculoskeletal: No worrisome lytic or sclerotic lesions. CT ABDOMEN PELVIS FINDINGS Hepatobiliary: Dominant heterogeneous low-attenuation lesion in the dome of the liver is similar in size, measuring 3.6 x 4.0 cm (2/42), previously 2.9 x 4.3 cm. Other lesions have increased in size, however. A 3.6 cm heterogeneous lesion slightly more inferiorly within the right hepatic lobe  (2/48) is larger, previously measuring 1.6 cm. Index lesion in the left hepatic lobe measures 3.1 cm (2/53), previously 1.7 cm. Gallbladder is unremarkable. No biliary ductal dilatation. Pancreas: Negative. Spleen: Negative. Adrenals/Urinary Tract: Adrenal glands are unremarkable. Low-attenuation lesions in the kidneys measure up to 3.2 cm on the left and are likely cysts. Kidneys are otherwise unremarkable. Ureters are decompressed. Bladder is unremarkable. Stomach/Bowel: Stomach, small bowel, appendix and majority of the colon are unremarkable. A metallic stent is seen in the rectosigmoid colon, where there is wall thickening and luminal narrowing. Adjacent soft tissue nodule along the left lateral margin of the rectosigmoid colon is slightly larger, measuring 1.5 cm (2/122), previously 12 mm. A fair amount of stool in the colon is indicative of constipation. Vascular/Lymphatic: Atherosclerotic calcification of the aorta without aneurysm. Periportal lymph node measures 1.1 cm (2/59), increased from 8 mm. A new low aortocaval lymph node measures 8 mm (2/79). Reproductive: Prostate is visualized. Other: Presacral edema is similar. Previously mentioned left perirectal soft tissue nodule appears slightly more prominent. There is a new nodule in the right perirectal fat, measuring 6 mm (2/116). Omental nodules in the right lateral abdomen measure up to 1.4 cm (2/69), slightly decreased in size from 1.6 cm. Small bilateral inguinal hernias contain fat. Small umbilical hernia contains fat. Musculoskeletal: No worrisome lytic or sclerotic lesions. IMPRESSION: 1. Pulmonary emboli with a saddle embolus. Critical Value/emergent results were called by telephone at the time of interpretation on 05/31/2019 at 12:03 pm to Cira Rue, NP , who verbally acknowledged these results. 2. Slight increase in perirectal soft tissue nodularity with overall worsening in pulmonary and hepatic metastatic disease. Omental nodularity appears  slightly improved. 3. New mediastinal and periportal adenopathy. Aortocaval lymph node is not enlarged by CT size criteria but is new and therefore considered suspicious for metastatic involvement. 4. Aortic atherosclerosis (ICD10-170.0). Coronary artery calcification. Electronically Signed   By: Lorin Picket M.D.   On: 05/31/2019 12:30   Ct Abdomen Pelvis W Contrast  Result Date: 05/31/2019 CLINICAL DATA:  Colorectal cancer staging with liver and lung metastases. Nausea and vomiting. Radiation therapy complete in June. Ongoing chemotherapy. EXAM: CT CHEST, ABDOMEN, AND PELVIS WITH CONTRAST TECHNIQUE: Multidetector CT imaging of the chest, abdomen and pelvis was performed following the standard protocol during bolus administration of intravenous contrast. CONTRAST:  165mL OMNIPAQUE IOHEXOL 300 MG/ML  SOLN COMPARISON:  CT chest 02/13/2019 and CT abdomen pelvis 01/18/2019. FINDINGS: CT CHEST FINDINGS Cardiovascular: Although the study was not performed to optimize for the detection of pulmonary emboli, there is a saddle embolus at the bifurcation of the right and left pulmonary arteries, with filling defects in the lobar pulmonary arteries bilaterally. Right IJ Port-A-Cath terminates in the low SVC,  near the SVC RA junction. Atherosclerotic calcification of the aorta and coronary arteries. Heart size normal. No pericardial effusion. Mediastinum/Nodes: Lymph node anterior to the proximal left pulmonary artery measures 11 mm (2/28), increased from 4 mm. No additional pathologically enlarged mediastinal, hilar or axillary lymph nodes. Esophagus is unremarkable. Lungs/Pleura: Some pulmonary nodules have improved in the interval. For example, right middle lobe nodules have nearly completely resolved (6/96 and 100). Others have enlarged. Index nodule in the peripheral right lower lobe measures 12 mm (6/107), previously 5 mm. An 8 mm nodule in the apical left upper lobe (6/46) has also increased in size from 4 mm. No  pleural fluid. Airway is unremarkable. Musculoskeletal: No worrisome lytic or sclerotic lesions. CT ABDOMEN PELVIS FINDINGS Hepatobiliary: Dominant heterogeneous low-attenuation lesion in the dome of the liver is similar in size, measuring 3.6 x 4.0 cm (2/42), previously 2.9 x 4.3 cm. Other lesions have increased in size, however. A 3.6 cm heterogeneous lesion slightly more inferiorly within the right hepatic lobe (2/48) is larger, previously measuring 1.6 cm. Index lesion in the left hepatic lobe measures 3.1 cm (2/53), previously 1.7 cm. Gallbladder is unremarkable. No biliary ductal dilatation. Pancreas: Negative. Spleen: Negative. Adrenals/Urinary Tract: Adrenal glands are unremarkable. Low-attenuation lesions in the kidneys measure up to 3.2 cm on the left and are likely cysts. Kidneys are otherwise unremarkable. Ureters are decompressed. Bladder is unremarkable. Stomach/Bowel: Stomach, small bowel, appendix and majority of the colon are unremarkable. A metallic stent is seen in the rectosigmoid colon, where there is wall thickening and luminal narrowing. Adjacent soft tissue nodule along the left lateral margin of the rectosigmoid colon is slightly larger, measuring 1.5 cm (2/122), previously 12 mm. A fair amount of stool in the colon is indicative of constipation. Vascular/Lymphatic: Atherosclerotic calcification of the aorta without aneurysm. Periportal lymph node measures 1.1 cm (2/59), increased from 8 mm. A new low aortocaval lymph node measures 8 mm (2/79). Reproductive: Prostate is visualized. Other: Presacral edema is similar. Previously mentioned left perirectal soft tissue nodule appears slightly more prominent. There is a new nodule in the right perirectal fat, measuring 6 mm (2/116). Omental nodules in the right lateral abdomen measure up to 1.4 cm (2/69), slightly decreased in size from 1.6 cm. Small bilateral inguinal hernias contain fat. Small umbilical hernia contains fat. Musculoskeletal: No  worrisome lytic or sclerotic lesions. IMPRESSION: 1. Pulmonary emboli with a saddle embolus. Critical Value/emergent results were called by telephone at the time of interpretation on 05/31/2019 at 12:03 pm to Cira Rue, NP , who verbally acknowledged these results. 2. Slight increase in perirectal soft tissue nodularity with overall worsening in pulmonary and hepatic metastatic disease. Omental nodularity appears slightly improved. 3. New mediastinal and periportal adenopathy. Aortocaval lymph node is not enlarged by CT size criteria but is new and therefore considered suspicious for metastatic involvement. 4. Aortic atherosclerosis (ICD10-170.0). Coronary artery calcification. Electronically Signed   By: Lorin Picket M.D.   On: 05/31/2019 12:30   Vas [place] Lower Extremity Venous (dvt)  Result Date: 06/01/2019  Lower Venous Study Indications: Pulmonary embolism.  Risk Factors: Colorectal cancer with lung mets. Comparison Study: no prior Performing Technologist: June Leap RDMS, RVT  Examination Guidelines: A complete evaluation includes B-mode imaging, spectral Doppler, color Doppler, and power Doppler as needed of all accessible portions of each vessel. Bilateral testing is considered an integral part of a complete examination. Limited examinations for reoccurring indications may be performed as noted.  +---------+---------------+---------+-----------+----------+--------------+ RIGHT    CompressibilityPhasicitySpontaneityPropertiesThrombus Aging +---------+---------------+---------+-----------+----------+--------------+  CFV      Full           Yes      Yes                                 +---------+---------------+---------+-----------+----------+--------------+ SFJ      Full                                                        +---------+---------------+---------+-----------+----------+--------------+ FV Prox  Full                                                         +---------+---------------+---------+-----------+----------+--------------+ FV Mid   Full                                                        +---------+---------------+---------+-----------+----------+--------------+ FV DistalFull                                                        +---------+---------------+---------+-----------+----------+--------------+ PFV      Full                                                        +---------+---------------+---------+-----------+----------+--------------+ POP      Full           Yes      Yes                                 +---------+---------------+---------+-----------+----------+--------------+ PTV      Full                                                        +---------+---------------+---------+-----------+----------+--------------+ PERO     Full                                                        +---------+---------------+---------+-----------+----------+--------------+   +---------------+---------------+---------+-----------+----------+-------------+ LEFT           CompressibilityPhasicitySpontaneityPropertiesThrombus  Aging         +---------------+---------------+---------+-----------+----------+-------------+ CFV            None           No       No                                 +---------------+---------------+---------+-----------+----------+-------------+ SFJ            None                                                       +---------------+---------------+---------+-----------+----------+-------------+ FV Prox        None                                                       +---------------+---------------+---------+-----------+----------+-------------+ FV Mid         None                                                        +---------------+---------------+---------+-----------+----------+-------------+ FV Distal      None                                                       +---------------+---------------+---------+-----------+----------+-------------+ PFV            None                                                       +---------------+---------------+---------+-----------+----------+-------------+ POP            None           No       No                                 +---------------+---------------+---------+-----------+----------+-------------+ PTV            Partial                                                    +---------------+---------------+---------+-----------+----------+-------------+ PERO                                                        Not  visualized    +---------------+---------------+---------+-----------+----------+-------------+ Ex Iliac                      Yes      Yes                  patent        mid-dist                                                                  +---------------+---------------+---------+-----------+----------+-------------+     Summary: Right: There is no evidence of deep vein thrombosis in the lower extremity. No cystic structure found in the popliteal fossa. Left: Findings consistent with acute deep vein thrombosis involving the left common femoral vein, left femoral vein, left proximal profunda vein, left popliteal vein, and left posterior tibial veins. No cystic structure found in the popliteal fossa.  *See table(s) above for measurements and observations. Electronically signed by Monica Martinez MD on 06/01/2019 at 4:56:21 PM.    Final        Discharge Exam: Vitals:   06/01/19 2102 06/02/19 0556  BP: (!) 149/97 124/81  Pulse: 77 79  Resp:  20  Temp: 99.6 F (37.6 C) 98.2 F (36.8 C)  SpO2: 96% 94%    General: Pt is alert,  awake, not in acute distress Cardiovascular: RRR, S1/S2 +, no edema Respiratory: CTA bilaterally, no wheezing, no rhonchi, no respiratory distress, no conversational dyspnea, on room air   Abdominal: Soft, NT, ND, bowel sounds + Extremities: no edema, no cyanosis Psych: Normal mood and affect, stable judgement and insight     The results of significant diagnostics from this hospitalization (including imaging, microbiology, ancillary and laboratory) are listed below for reference.     Microbiology: Recent Results (from the past 240 hour(s))  SARS Coronavirus 2 [facility] order, Performed in [facility] hospital lab) Nasopharyngeal Nasopharyngeal Swab     Status: None   Collection Time: 05/31/19  8:17 PM   Specimen: Nasopharyngeal Swab  Result Value Ref Range Status   SARS Coronavirus 2 NEGATIVE NEGATIVE Final    Comment: (NOTE) If result is NEGATIVE SARS-CoV-2 target nucleic acids are NOT DETECTED. The SARS-CoV-2 RNA is generally detectable in upper and lower  respiratory specimens during the acute phase of infection. The lowest  concentration of SARS-CoV-2 viral copies this assay can detect is 250  copies / mL. A negative result does not preclude SARS-CoV-2 infection  and should not be used as the sole basis for treatment or other  patient management decisions.  A negative result may occur with  improper specimen collection / handling, submission of specimen other  than nasopharyngeal swab, presence of viral mutation(s) within the  areas targeted by this assay, and inadequate number of viral copies  (<250 copies / mL). A negative result must be combined with clinical  observations, patient history, and epidemiological information. If result is POSITIVE SARS-CoV-2 target nucleic acids are DETECTED. The SARS-CoV-2 RNA is generally detectable in upper and lower  respiratory specimens dur ing the acute phase of infection.  Positive  results are indicative of active infection with  SARS-CoV-2.  Clinical  correlation with patient history and other diagnostic information is  necessary to determine patient infection status.  Positive results do  not rule out bacterial infection or co-infection with other viruses. If result is PRESUMPTIVE POSTIVE SARS-CoV-2 nucleic acids MAY BE PRESENT.   A presumptive positive result was obtained on the submitted specimen  and confirmed on repeat testing.  While 2019 novel coronavirus  (SARS-CoV-2) nucleic acids may be present in the submitted sample  additional confirmatory testing may be necessary for epidemiological  and / or clinical management purposes  to differentiate between  SARS-CoV-2 and other Sarbecovirus currently known to infect humans.  If clinically indicated additional testing with an alternate test  methodology [phone]) is advised. The SARS-CoV-2 RNA is generally  detectable in upper and lower respiratory sp ecimens during the acute  phase of infection. The expected result is Negative. Fact Sheet for Patients:  StrictlyIdeas.no Fact Sheet for Healthcare Providers: BankingDealers.co.za This test is not yet approved or cleared by the [place] FDA and has been authorized for detection and/or diagnosis of SARS-CoV-2 by FDA under an Emergency Use Authorization (EUA).  This EUA will remain in effect (meaning this test can be used) for the duration of the COVID-19 declaration under Section 564(b)(1) of the Act, 21 U.S.C. section 360bbb-3(b)(1), unless the authorization is terminated or revoked sooner. Performed at [facility], [place] [address], [place], [place] [address]      Labs: BNP (last 3 results) No results for input(s): BNP in the last 8760 hours. Basic Metabolic Panel: Recent Labs  Lab 05/31/19 1030 05/31/19 1856 05/31/19 2056 06/01/19 0438 06/02/19 0337  NA 139 138  --   --  141  K 4.1 3.8  --   --  3.3*  CL 107 107  --   --  110   CO2 19* 18*  --   --  22  GLUCOSE 117* 118*  --   --  95  BUN 17 19  --   --  10  CREATININE 1.15 1.27*  --   --  0.96  CALCIUM 8.8* 8.4*  --   --  7.7*  MG 1.5*  --  1.5* 2.1  --    Liver Function Tests: Recent Labs  Lab 05/31/19 1030 05/31/19 1856  AST 18 21  ALT 15 16  ALKPHOS 325* 261*  BILITOT 0.4 0.7  PROT 6.9 6.8  ALBUMIN 2.5* 2.7*   No results for input(s): LIPASE, AMYLASE in the last 168 hours. No results for input(s): AMMONIA in the last 168 hours. CBC: Recent Labs  Lab 05/31/19 1030 05/31/19 1856 06/01/19 0438 06/02/19 0337  WBC 12.0* 10.2 7.2 5.6  NEUTROABS 8.8* 7.1  --   --   HGB 11.0* 10.5* 9.4* 8.5*  HCT 35.4* 33.6* 30.6* 27.7*  MCV 95.4 97.4 99.0 98.9  PLT 261 257 201 207   Cardiac Enzymes: No results for input(s): CKTOTAL, CKMB, CKMBINDEX, TROPONINI in the last 168 hours. BNP: Invalid input(s): POCBNP CBG: No results for input(s): GLUCAP in the last 168 hours. D-Dimer No results for input(s): DDIMER in the last 72 hours. Hgb A1c No results for input(s): HGBA1C in the last 72 hours. Lipid Profile No results for input(s): CHOL, HDL, LDLCALC, TRIG, CHOLHDL, LDLDIRECT in the last 72 hours. Thyroid function studies No results for input(s): TSH, T4TOTAL, T3FREE, THYROIDAB in the last 72 hours.  Invalid input(s): FREET3 Anemia work up Recent Labs    05/31/19 1030  FERRITIN 82   Urinalysis    Component Value Date/Time   PROTEINUR NEGATIVE 05/31/2019 1020   Sepsis Labs Invalid input(s): PROCALCITONIN,  WBC,  [facility] Recent Results (from the past 240 hour(s))  SARS Coronavirus 2 [facility] order, Performed in [facility] hospital lab) Nasopharyngeal Nasopharyngeal Swab     Status: None   Collection Time: 05/31/19  8:17 PM   Specimen: Nasopharyngeal Swab  Result Value Ref Range Status   SARS Coronavirus 2 NEGATIVE NEGATIVE Final    Comment: (NOTE) If result is NEGATIVE SARS-CoV-2 target nucleic acids are NOT  DETECTED. The SARS-CoV-2 RNA is generally detectable in upper and lower  respiratory specimens during the acute phase of infection. The lowest  concentration of SARS-CoV-2 viral copies this assay can detect is 250  copies / mL. A negative result does not preclude SARS-CoV-2 infection  and should not be used as the sole basis for treatment or other  patient management decisions.  A negative result may occur with  improper specimen collection / handling, submission of specimen other  than nasopharyngeal swab, presence of viral mutation(s) within the  areas targeted by this assay, and inadequate number of viral copies  (<250 copies / mL). A negative result must be combined with clinical  observations, patient history, and epidemiological information. If result is POSITIVE SARS-CoV-2 target nucleic acids are DETECTED. The SARS-CoV-2 RNA is generally detectable in upper and lower  respiratory specimens dur ing the acute phase of infection.  Positive  results are indicative of active infection with SARS-CoV-2.  Clinical  correlation with patient history and other diagnostic information is  necessary to determine patient infection status.  Positive results do  not rule out bacterial infection or co-infection with other viruses. If result is PRESUMPTIVE POSTIVE SARS-CoV-2 nucleic acids MAY BE PRESENT.   A presumptive positive result was obtained on the submitted specimen  and confirmed on repeat testing.  While 2019 novel coronavirus  (SARS-CoV-2) nucleic acids may be present in the submitted sample  additional confirmatory testing may be necessary for epidemiological  and / or clinical management purposes  to differentiate between  SARS-CoV-2 and other Sarbecovirus currently known to infect humans.  If clinically indicated additional testing with an alternate test  methodology [id number]) is advised. The SARS-CoV-2 RNA is generally  detectable in upper and lower respiratory sp ecimens during  the acute  phase of infection. The expected result is Negative. Fact Sheet for Patients:  StrictlyIdeas.no Fact Sheet for Healthcare Providers: BankingDealers.co.za This test is not yet approved or cleared by the [place] FDA and has been authorized for detection and/or diagnosis of SARS-CoV-2 by FDA under an Emergency Use Authorization (EUA).  This EUA will remain in effect (meaning this test can be used) for the duration of the COVID-19 declaration under Section 564(b)(1) of the Act, 21 U.S.C. section 360bbb-3(b)(1), unless the authorization is terminated or revoked sooner. Performed at [facility], [place] [address], [place], [place] [address]      Patient was seen and examined on the day of discharge and was found to be in stable condition. Time coordinating discharge: 25 minutes including assessment and coordination of care, as well as examination of the patient.   SIGNED:  Dessa Phi, DO [facility] [email or web address] 06/02/2019, 10:01 AM

## 2019-06-02 NOTE — Progress Notes (Signed)
[place] for Heparin --> Lovenox  Indication: acute saddle PE and LLE DVT  No Known Allergies  Patient Measurements: Height: 6\' 1"  (185.4 cm) Weight: 227 lb (103 kg) IBW/kg (Calculated) : 79.9 HEPARIN DW (KG): 100.8   Vital Signs: Temp: 98.2 F (36.8 C) (08/28 0556) Temp Source: Oral (08/28 0556) BP: 124/81 (08/28 0556) Pulse Rate: 79 (08/28 0556)  Labs: Recent Labs    05/31/19 1030  05/31/19 1856 05/31/19 2056  06/01/19 0438 06/01/19 1506 06/01/19 2254 06/02/19 0337  HGB 11.0*   < > 10.5*  --   --  9.4*  --   --  8.5*  HCT 35.4*  --  33.6*  --   --  30.6*  --   --  27.7*  PLT 261  --  257  --   --  201  --   --  207  LABPROT  --   --  15.1  --   --   --   --   --   --   INR  --   --  1.2  --   --   --   --   --   --   HEPARINUNFRC  --   --   --   --    < > 0.84* 0.45 0.46 0.41  CREATININE 1.15  --  1.27*  --   --   --   --   --  0.96  TROPONINIHS  --   --  5 5  --   --   --   --   --    < > = values in this interval not displayed.    Estimated Creatinine Clearance: 96.7 mL/min (by C-G formula based on SCr of 0.96 mg/dL).   Medical History: Past Medical History:  Diagnosis Date  . colorectal ca dx'd 07/2018  . Liver metastases ([place]) dx'd 07/2018  . Lung metastases ([place]) dx'd 07/2018    Medications:  Started on Lovenox 100mg  SQ BID at [facility] visit 8/26 - only received x 1 dose at 1300   Assessment: 68 yo M with PMH of metastatic rectal CA. Patient states he had a blood clot previously for which he is supposed to be on Xarelto but has not been compliant with this medication due to episodic bleeding from his colonic stent which is worse when he uses Xarelto. Patient was diagnosed with saddle PE on CT at oncology visit on 8/26. He refused hospital admission so was started on Lovenox SQ BID. He returned to ED 8/26 afternoon and was subsequently admitted. Pharmacy consulted to start IV heparin. Venous doppler also + for LLE DVT.  FOBT+ (MD aware).   Pharmacy asked to transition anticoagulation today, 8/28, to Lovenox in preparation for discharge.   AM heparin level = 0.41 units/mL, therapeutic  CBC: Hgb decreased to 8.5, Pltc WNL SCr 0.96, CrCl ~ 97 ml/min   Goal of Therapy:  Appropriate treatment of VTE Monitor platelets by anticoagulation protocol: Yes   Plan:   Discontinue heparin infusion at 0900.  At 1000, resume Lovenox 100mg  SQ q12h  Monitor CBC and for further bleeding issues   Lindell Spar, PharmD, BCPS Clinical Pharmacist  06/02/2019,7:42 AM

## 2019-06-02 NOTE — Progress Notes (Signed)
HEMATOLOGY-ONCOLOGY PROGRESS NOTE  SUBJECTIVE: Mr. Dirickson is doing well overall.  He had 2 loose bowel movement this morning, with minimal blood.  No other signs of bleeding.  No shortness of breath or chest discomfort.  No other new changes.   REVIEW OF SYSTEMS:   Constitutional: Denies fevers, chills or abnormal weight loss Eyes: Denies blurriness of vision Ears, nose, mouth, throat, and face: Denies mucositis or sore throat Respiratory: Denies cough, dyspnea or wheezes Cardiovascular: Denies palpitation, chest discomfort Gastrointestinal:  Denies nausea, heartburn or change in bowel habits Skin: Denies abnormal skin rashes Lymphatics: Denies new lymphadenopathy or easy bruising Neurological:Denies numbness, tingling or new weaknesses Behavioral/Psych: Mood is stable, no new changes  Extremities: No lower extremity edema All other systems were reviewed with the patient and are negative.  I have reviewed the past medical history, past surgical history, social history and family history with the patient and they are unchanged from previous note.   PHYSICAL EXAMINATION:  Vitals:   06/01/19 2102 06/02/19 0556  BP: (!) 149/97 124/81  Pulse: 77 79  Resp:  20  Temp: 99.6 F (37.6 C) 98.2 F (36.8 C)  SpO2: 96% 94%   Filed Weights   05/31/19 1837 05/31/19 2138  Weight: 224 lb (101.6 kg) 227 lb (103 kg)    Intake/Output from previous day: 08/27 0701 - 08/28 0700 In: 720 [P.O.:720] Out: -   GENERAL:alert, no distress and comfortable SKIN: skin color, texture, turgor are normal, no rashes or significant lesions EYES: normal, Conjunctiva are pink and non-injected, sclera clear OROPHARYNX:no exudate, no erythema and lips, buccal mucosa, and tongue normal  NECK: supple, thyroid normal size, non-tender, without nodularity LYMPH:  no palpable lymphadenopathy in the cervical, axillary or inguinal LUNGS: clear to auscultation and percussion with normal breathing effort HEART:  regular rate & rhythm and no murmurs and no lower extremity edema ABDOMEN:abdomen soft, non-tender and normal bowel sounds Musculoskeletal:no cyanosis of digits and no clubbing  NEURO: alert & oriented x 3 with fluent speech, no focal motor/sensory deficits  LABORATORY DATA:  I have reviewed the data as listed CMP Latest Ref Rng & Units 06/02/2019 05/31/2019 05/31/2019  Glucose 70 - 99 mg/dL 95 118(H) 117(H)  BUN 8 - 23 mg/dL 10 19 17   Creatinine 0.61 - 1.24 mg/dL 0.96 1.27(H) 1.15  Sodium 135 - 145 mmol/L 141 138 139  Potassium 3.5 - 5.1 mmol/L 3.3(L) 3.8 4.1  Chloride 98 - 111 mmol/L 110 107 107  CO2 22 - 32 mmol/L 22 18(L) 19(L)  Calcium 8.9 - 10.3 mg/dL 7.7(L) 8.4(L) 8.8(L)  Total Protein 6.5 - 8.1 g/dL - 6.8 6.9  Total Bilirubin 0.3 - 1.2 mg/dL - 0.7 0.4  Alkaline Phos 38 - 126 U/L - 261(H) 325(H)  AST 15 - 41 U/L - 21 18  ALT 0 - 44 U/L - 16 15    Lab Results  Component Value Date   WBC 5.6 06/02/2019   HGB 8.5 (L) 06/02/2019   HCT 27.7 (L) 06/02/2019   MCV 98.9 06/02/2019   PLT 207 06/02/2019   NEUTROABS 7.1 05/31/2019    Ct Chest W Contrast  Result Date: 05/31/2019 CLINICAL DATA:  Colorectal cancer staging with liver and lung metastases. Nausea and vomiting. Radiation therapy complete in June. Ongoing chemotherapy. EXAM: CT CHEST, ABDOMEN, AND PELVIS WITH CONTRAST TECHNIQUE: Multidetector CT imaging of the chest, abdomen and pelvis was performed following the standard protocol during bolus administration of intravenous contrast. CONTRAST:  146mL OMNIPAQUE IOHEXOL 300 MG/ML  SOLN COMPARISON:  CT chest 02/13/2019 and CT abdomen pelvis 01/18/2019. FINDINGS: CT CHEST FINDINGS Cardiovascular: Although the study was not performed to optimize for the detection of pulmonary emboli, there is a saddle embolus at the bifurcation of the right and left pulmonary arteries, with filling defects in the lobar pulmonary arteries bilaterally. Right IJ Port-A-Cath terminates in the low SVC, near  the SVC RA junction. Atherosclerotic calcification of the aorta and coronary arteries. Heart size normal. No pericardial effusion. Mediastinum/Nodes: Lymph node anterior to the proximal left pulmonary artery measures 11 mm (2/28), increased from 4 mm. No additional pathologically enlarged mediastinal, hilar or axillary lymph nodes. Esophagus is unremarkable. Lungs/Pleura: Some pulmonary nodules have improved in the interval. For example, right middle lobe nodules have nearly completely resolved (6/96 and 100). Others have enlarged. Index nodule in the peripheral right lower lobe measures 12 mm (6/107), previously 5 mm. An 8 mm nodule in the apical left upper lobe (6/46) has also increased in size from 4 mm. No pleural fluid. Airway is unremarkable. Musculoskeletal: No worrisome lytic or sclerotic lesions. CT ABDOMEN PELVIS FINDINGS Hepatobiliary: Dominant heterogeneous low-attenuation lesion in the dome of the liver is similar in size, measuring 3.6 x 4.0 cm (2/42), previously 2.9 x 4.3 cm. Other lesions have increased in size, however. A 3.6 cm heterogeneous lesion slightly more inferiorly within the right hepatic lobe (2/48) is larger, previously measuring 1.6 cm. Index lesion in the left hepatic lobe measures 3.1 cm (2/53), previously 1.7 cm. Gallbladder is unremarkable. No biliary ductal dilatation. Pancreas: Negative. Spleen: Negative. Adrenals/Urinary Tract: Adrenal glands are unremarkable. Low-attenuation lesions in the kidneys measure up to 3.2 cm on the left and are likely cysts. Kidneys are otherwise unremarkable. Ureters are decompressed. Bladder is unremarkable. Stomach/Bowel: Stomach, small bowel, appendix and majority of the colon are unremarkable. A metallic stent is seen in the rectosigmoid colon, where there is wall thickening and luminal narrowing. Adjacent soft tissue nodule along the left lateral margin of the rectosigmoid colon is slightly larger, measuring 1.5 cm (2/122), previously 12 mm. A  fair amount of stool in the colon is indicative of constipation. Vascular/Lymphatic: Atherosclerotic calcification of the aorta without aneurysm. Periportal lymph node measures 1.1 cm (2/59), increased from 8 mm. A new low aortocaval lymph node measures 8 mm (2/79). Reproductive: Prostate is visualized. Other: Presacral edema is similar. Previously mentioned left perirectal soft tissue nodule appears slightly more prominent. There is a new nodule in the right perirectal fat, measuring 6 mm (2/116). Omental nodules in the right lateral abdomen measure up to 1.4 cm (2/69), slightly decreased in size from 1.6 cm. Small bilateral inguinal hernias contain fat. Small umbilical hernia contains fat. Musculoskeletal: No worrisome lytic or sclerotic lesions. IMPRESSION: 1. Pulmonary emboli with a saddle embolus. Critical Value/emergent results were called by telephone at the time of interpretation on 05/31/2019 at 12:03 pm to Cira Rue, NP , who verbally acknowledged these results. 2. Slight increase in perirectal soft tissue nodularity with overall worsening in pulmonary and hepatic metastatic disease. Omental nodularity appears slightly improved. 3. New mediastinal and periportal adenopathy. Aortocaval lymph node is not enlarged by CT size criteria but is new and therefore considered suspicious for metastatic involvement. 4. Aortic atherosclerosis (ICD10-170.0). Coronary artery calcification. Electronically Signed   By: Lorin Picket M.D.   On: 05/31/2019 12:30   Ct Abdomen Pelvis W Contrast  Result Date: 05/31/2019 CLINICAL DATA:  Colorectal cancer staging with liver and lung metastases. Nausea and vomiting. Radiation therapy complete in June.  Ongoing chemotherapy. EXAM: CT CHEST, ABDOMEN, AND PELVIS WITH CONTRAST TECHNIQUE: Multidetector CT imaging of the chest, abdomen and pelvis was performed following the standard protocol during bolus administration of intravenous contrast. CONTRAST:  141mL OMNIPAQUE IOHEXOL  300 MG/ML  SOLN COMPARISON:  CT chest 02/13/2019 and CT abdomen pelvis 01/18/2019. FINDINGS: CT CHEST FINDINGS Cardiovascular: Although the study was not performed to optimize for the detection of pulmonary emboli, there is a saddle embolus at the bifurcation of the right and left pulmonary arteries, with filling defects in the lobar pulmonary arteries bilaterally. Right IJ Port-A-Cath terminates in the low SVC, near the SVC RA junction. Atherosclerotic calcification of the aorta and coronary arteries. Heart size normal. No pericardial effusion. Mediastinum/Nodes: Lymph node anterior to the proximal left pulmonary artery measures 11 mm (2/28), increased from 4 mm. No additional pathologically enlarged mediastinal, hilar or axillary lymph nodes. Esophagus is unremarkable. Lungs/Pleura: Some pulmonary nodules have improved in the interval. For example, right middle lobe nodules have nearly completely resolved (6/96 and 100). Others have enlarged. Index nodule in the peripheral right lower lobe measures 12 mm (6/107), previously 5 mm. An 8 mm nodule in the apical left upper lobe (6/46) has also increased in size from 4 mm. No pleural fluid. Airway is unremarkable. Musculoskeletal: No worrisome lytic or sclerotic lesions. CT ABDOMEN PELVIS FINDINGS Hepatobiliary: Dominant heterogeneous low-attenuation lesion in the dome of the liver is similar in size, measuring 3.6 x 4.0 cm (2/42), previously 2.9 x 4.3 cm. Other lesions have increased in size, however. A 3.6 cm heterogeneous lesion slightly more inferiorly within the right hepatic lobe (2/48) is larger, previously measuring 1.6 cm. Index lesion in the left hepatic lobe measures 3.1 cm (2/53), previously 1.7 cm. Gallbladder is unremarkable. No biliary ductal dilatation. Pancreas: Negative. Spleen: Negative. Adrenals/Urinary Tract: Adrenal glands are unremarkable. Low-attenuation lesions in the kidneys measure up to 3.2 cm on the left and are likely cysts. Kidneys are  otherwise unremarkable. Ureters are decompressed. Bladder is unremarkable. Stomach/Bowel: Stomach, small bowel, appendix and majority of the colon are unremarkable. A metallic stent is seen in the rectosigmoid colon, where there is wall thickening and luminal narrowing. Adjacent soft tissue nodule along the left lateral margin of the rectosigmoid colon is slightly larger, measuring 1.5 cm (2/122), previously 12 mm. A fair amount of stool in the colon is indicative of constipation. Vascular/Lymphatic: Atherosclerotic calcification of the aorta without aneurysm. Periportal lymph node measures 1.1 cm (2/59), increased from 8 mm. A new low aortocaval lymph node measures 8 mm (2/79). Reproductive: Prostate is visualized. Other: Presacral edema is similar. Previously mentioned left perirectal soft tissue nodule appears slightly more prominent. There is a new nodule in the right perirectal fat, measuring 6 mm (2/116). Omental nodules in the right lateral abdomen measure up to 1.4 cm (2/69), slightly decreased in size from 1.6 cm. Small bilateral inguinal hernias contain fat. Small umbilical hernia contains fat. Musculoskeletal: No worrisome lytic or sclerotic lesions. IMPRESSION: 1. Pulmonary emboli with a saddle embolus. Critical Value/emergent results were called by telephone at the time of interpretation on 05/31/2019 at 12:03 pm to Cira Rue, NP , who verbally acknowledged these results. 2. Slight increase in perirectal soft tissue nodularity with overall worsening in pulmonary and hepatic metastatic disease. Omental nodularity appears slightly improved. 3. New mediastinal and periportal adenopathy. Aortocaval lymph node is not enlarged by CT size criteria but is new and therefore considered suspicious for metastatic involvement. 4. Aortic atherosclerosis (ICD10-170.0). Coronary artery calcification. Electronically Signed  By: Lorin Picket M.D.   On: 05/31/2019 12:30   Vas [place] Lower Extremity Venous  (dvt)  Result Date: 06/01/2019  Lower Venous Study Indications: Pulmonary embolism.  Risk Factors: Colorectal cancer with lung mets. Comparison Study: no prior Performing Technologist: June Leap RDMS, RVT  Examination Guidelines: A complete evaluation includes B-mode imaging, spectral Doppler, color Doppler, and power Doppler as needed of all accessible portions of each vessel. Bilateral testing is considered an integral part of a complete examination. Limited examinations for reoccurring indications may be performed as noted.  +---------+---------------+---------+-----------+----------+--------------+ RIGHT    CompressibilityPhasicitySpontaneityPropertiesThrombus Aging +---------+---------------+---------+-----------+----------+--------------+ CFV      Full           Yes      Yes                                 +---------+---------------+---------+-----------+----------+--------------+ SFJ      Full                                                        +---------+---------------+---------+-----------+----------+--------------+ FV Prox  Full                                                        +---------+---------------+---------+-----------+----------+--------------+ FV Mid   Full                                                        +---------+---------------+---------+-----------+----------+--------------+ FV DistalFull                                                        +---------+---------------+---------+-----------+----------+--------------+ PFV      Full                                                        +---------+---------------+---------+-----------+----------+--------------+ POP      Full           Yes      Yes                                 +---------+---------------+---------+-----------+----------+--------------+ PTV      Full                                                         +---------+---------------+---------+-----------+----------+--------------+ PERO     Full                                                        +---------+---------------+---------+-----------+----------+--------------+   +---------------+---------------+---------+-----------+----------+-------------+  LEFT           CompressibilityPhasicitySpontaneityPropertiesThrombus                                                                  Aging         +---------------+---------------+---------+-----------+----------+-------------+ CFV            None           No       No                                 +---------------+---------------+---------+-----------+----------+-------------+ SFJ            None                                                       +---------------+---------------+---------+-----------+----------+-------------+ FV Prox        None                                                       +---------------+---------------+---------+-----------+----------+-------------+ FV Mid         None                                                       +---------------+---------------+---------+-----------+----------+-------------+ FV Distal      None                                                       +---------------+---------------+---------+-----------+----------+-------------+ PFV            None                                                       +---------------+---------------+---------+-----------+----------+-------------+ POP            None           No       No                                 +---------------+---------------+---------+-----------+----------+-------------+ PTV            Partial                                                    +---------------+---------------+---------+-----------+----------+-------------+  PERO                                                        Not                                                                        visualized    +---------------+---------------+---------+-----------+----------+-------------+ Ex Iliac                      Yes      Yes                  patent        mid-dist                                                                  +---------------+---------------+---------+-----------+----------+-------------+     Summary: Right: There is no evidence of deep vein thrombosis in the lower extremity. No cystic structure found in the popliteal fossa. Left: Findings consistent with acute deep vein thrombosis involving the left common femoral vein, left femoral vein, left proximal profunda vein, left popliteal vein, and left posterior tibial veins. No cystic structure found in the popliteal fossa.  *See table(s) above for measurements and observations. Electronically signed by Monica Martinez MD on 06/01/2019 at 4:56:21 PM.    Final     ASSESSMENT AND PLAN: 1.  Acute saddle PE and left LE DVT 2.  Metastatic rectal cancer 3.  Rectal bleeding, mild  4.  Iron deficiency anemia 5.  History of PE 6.  Hypertension  -pt is clinically stable, will go home later today, and do lovenox 100mg  injection at home every 12h -Hg dropped some this morning, no frank GI or other bleeding, will monitor -He knows to call us if he experience bleedings, if mild, will decrease Lovenox by 10-30%, if severe, he knows to return to ED -I will see him back next week to start chemo    LOS: 1 day   Truitt Merle, MD 06/02/19

## 2019-06-02 NOTE — Telephone Encounter (Signed)
Scheduled appt per 8/28 sch message - unable to reach pt . Left message with appt date and time   

## 2019-06-05 ENCOUNTER — Other Ambulatory Visit: Payer: Self-pay | Admitting: Nurse Practitioner

## 2019-06-05 ENCOUNTER — Other Ambulatory Visit: Payer: Self-pay | Admitting: Hematology

## 2019-06-05 MED ORDER — OXYCODONE HCL 5 MG PO TABS: 5.0000 mg | ORAL_TABLET | Freq: Four times a day (QID) | ORAL | 0 refills | Status: DC | PRN

## 2019-06-05 NOTE — Progress Notes (Signed)
Billy Mendoza   Telephone:([phone]) [phone] Fax:(336) [phone]   Clinic Follow up Note   Patient Care Team: Merry Proud, MD as PCP - General (Internal Medicine) Truitt Merle, MD as Consulting Physician (Hematology) Carol Ada, MD as Consulting Physician (Gastroenterology)  Date of Service:  06/07/2019  CHIEF COMPLAINT: F/u of rectal cancer metastasized to liver  SUMMARY OF ONCOLOGIC HISTORY: Oncology History Overview Note  Cancer Staging Rectal cancer metastasized to liver [facility]) Staging form: Colon and Rectum, AJCC 8th Edition - Clinical stage from 07/28/2018: Stage IVB (cTX, cNX, pM1b) - Signed by Truitt Merle, MD on 08/18/2018     Rectal cancer metastasized to liver ([place])  07/28/2018 Initial Biopsy   Biopsy 07/28/18 Final Microscopic Diagnosis A.Colon, Ascending, Polyp:  -Fragments of tubular adenoma -No high-grade dysplasia of malignancy.  B. Rectum, Mass, Polyp:  -Invasive adenocarcinoma, moderately - poorly differentiatred.    07/28/2018 Cancer Staging   Staging form: Colon and Rectum, AJCC 8th Edition - Clinical stage from 07/28/2018: Stage IVB (cTX, cNX, pM1b) - Signed by Truitt Merle, MD on 08/18/2018   08/04/2018 Initial Diagnosis   Rectal cancer metastasized to liver ([place])   08/16/2018 Pathology Results   Biospy  Diagnosis 08/16/18 Liver, needle/core biopsy - ADENOCARCINOMA. Microscopic Comment By morphology, the findings are compatible with the provided clinical history of colorectal adenocarcinoma. Results reported to [name] on 08/18/2018. Intradepartmental consultation was obtained ([name]).    08/16/2018 Miscellaneous   Foundation 1 genomic testing: MS-stable Tumor mutation burden  3/Mb K-ras/NRAS wild-type BRAF V600 E KDMSA R266Q PTEN T352f* SMAD4 loss TP53 loss     08/18/2018 Procedure   Colonoscopy by Dr. HBenson Norwayshowed malignant appearing partially obstructing tumor in the rectum, 8 cm above anal verge.   08/18/2018 -  09/07/2018 Chemotherapy   CAPOX q3weeks with Xeloda 20034mBID 2 weeks on, 1 weeks off starting 08/18/18.  Swithced treatment due to BRAF mutation.    09/07/2018 - 12/24/2018 Chemotherapy   FOLFOXIRI and avastin every 2 weeks starting 09/07/18. Oxaliplatin held starting with cycle 8 and continue with FOLFIRI and Avastin. D/c due to disease progression.    11/04/2018 Imaging   CT CAP 11/04/18 IMPRESSION: 1. Peripheral filling defect in the right main pulmonary artery extending primarily into the right lower lobe pulmonary artery. While the peripheral location indicates some degree of chronicity of this pulmonary embolus, this is a new finding compared to 07/28/2018. The right ventricular to left ventricular ratio is 0.7, which does not specifically indicate right heart strain. 2. The malignancy appears considerably improved, with reduced size pulmonary nodules; reduced size and increased internal necrosis of the hepatic metastatic lesions; marked improvement in the thoracic adenopathy; reduced conspicuity of the rectal tumor; and significant reduction in perirectal tumor burden/adenopathy. 3. Other imaging findings of potential clinical significance: Aortic Atherosclerosis (ICD10-I70.0). Coronary atherosclerosis. Bilateral renal cysts. Small umbilical hernia contains adipose tissue.  Critical Value/emergent results were called by telephone at the time of interpretation on 11/04/2018 at 3:05 pm to Dr. YATruitt Merle who verbally acknowledged these results.   01/18/2019 Imaging   CT AP W contrast  IMPRESSION: 1. Colonic obstruction resulting from rectal carcinoma. 2. Hepatic metastatic disease which is decreased in size compared with the prior examination of 11/04/2018. 3. Multiple right middle lobe and right lower lobe pulmonary nodules most consistent with metastatic disease.   02/01/2019 - 06/01/2019 Chemotherapy   second line BRAFTOVI(encorafenib) 30076maily and MEKTOVI(binimetinib), 41m77m12h and Vectibix q2weeks starting 02/01/19.  -He has held  Mektovi 02/19/19-04/06/19 due to RT  -Stopped on 06/01/19 due to disease progression    02/13/2019 Imaging   CT Chest  IMPRESSION: Small bilateral pulmonary metastases, mildly progressive from January 2020.  Multifocal hepatic metastases, incompletely visualized, favored to be progressive from April 2020.  Aortic Atherosclerosis (ICD10-I70.0).    02/22/2019 - 03/21/2019 Radiation Therapy   -Palliative local radiation  -Holding mektovi (binimetinib) during RT -Due to significant rectal pain, diarrhea and stool incontinence, he stopped RT after 03/13/19. He was only able to finish 1/2 remianing treatments on 03/21/19.    05/31/2019 Imaging   CT CAP W contrast 05/31/19  IMPRESSION: 1. Pulmonary emboli with a saddle embolus. Critical Value/emergent results were called by telephone at the time of interpretation on 05/31/2019 at 12:03 pm to Cira Rue, NP , who verbally acknowledged these results. 2. Slight increase in perirectal soft tissue nodularity with overall worsening in pulmonary and hepatic metastatic disease. Omental nodularity appears slightly improved. 3. New mediastinal and periportal adenopathy. Aortocaval lymph node is not enlarged by CT size criteria but is new and therefore considered suspicious for metastatic involvement. 4. Aortic atherosclerosis (ICD10-170.0). Coronary artery calcification.   06/2019 -  Chemotherapy   FOLFOXIRI and Vectibix q2weeks starting 06/2019      CURRENT THERAPY:  FOLFOXIRI and Vectibix q2weeks starting 06/2019  INTERVAL HISTORY:  [name] is here for a follow up. He was hospitalized last week for acute saddle PE and left DVT. He presents to the clinic alone. Since discharge her notes he has been constipated. He took a colase last night and this morning. He will hold on taking laxative while in clinic. He notes his last Bowel movement was yesterday but not getting it fully out. He  denied blood in stool since hospitalization. He feels his breathing is improving, but not at baseline. He cough, fever or chest pain.  He feels his toes and fingertips are improving from past neuropathy. He notes he has been having his wife help him with his Lovenox injections BID.     REVIEW OF SYSTEMS:   Constitutional: Denies fevers, chills or abnormal weight loss Eyes: Denies blurriness of vision Ears, nose, mouth, throat, and face: Denies mucositis or sore throat Respiratory: Denies cough or wheezes (+) Mild breathing depression Cardiovascular: Denies palpitation, chest discomfort or lower extremity swelling Gastrointestinal:  Denies nausea, heartburn (+) Mild constipation  Skin: Denies abnormal skin rashes Lymphatics: Denies new lymphadenopathy or easy bruising Neurological: (+) Neuropathy of toes and fingertips have improved Behavioral/Psych: Mood is stable, no new changes  All other systems were reviewed with the patient and are negative.  MEDICAL HISTORY:  Past Medical History:  Diagnosis Date  . colorectal ca dx'd 07/2018  . Liver metastases ([place]) dx'd 07/2018  . Lung metastases ([place]) dx'd 07/2018    SURGICAL HISTORY: Past Surgical History:  Procedure Laterality Date  . COLONIC STENT PLACEMENT N/A 01/19/2019   Procedure: COLONIC STENT PLACEMENT;  Surgeon: Carol Ada, MD;  Location: [facility];  Service: Endoscopy;  Laterality: N/A;  . FLEXIBLE SIGMOIDOSCOPY N/A 01/19/2019   Procedure: FLEXIBLE SIGMOIDOSCOPY;  Surgeon: Carol Ada, MD;  Location: [facility];  Service: Endoscopy;  Laterality: N/A;  . IR IMAGING GUIDED PORT INSERTION  08/16/2018  . IR US GUIDE BX ASP/DRAIN  08/16/2018    I have reviewed the social history and family history with the patient and they are unchanged from previous note.  ALLERGIES:  has No Known Allergies.  MEDICATIONS:  Current Outpatient Medications  Medication Sig Dispense Refill  .  acetaminophen (TYLENOL) 325 MG tablet Take  650 mg by mouth every 6 (six) hours as needed for moderate pain.    . diphenoxylate-atropine (LOMOTIL) 2.5-0.025 MG tablet Take 1-2 tablets by mouth 4 (four) times daily as needed for diarrhea or loose stools. Up to 8 tabs daily for severe diarrhea 60 tablet 2  . enoxaparin (LOVENOX) 100 MG/ML injection Inject 1 mL (100 mg total) into the skin every 12 (twelve) hours. 62 mL 0  . MAGNESIUM-OXIDE 400 (241.3 Mg) MG tablet TAKE 1 TABLET BY MOUTH DAILY (Patient taking differently: Take 200 mg by mouth 2 (two) times daily. ) 30 tablet 1  . morphine (MS CONTIN) 15 MG 12 hr tablet Take 1 tablet (15 mg total) by mouth every 12 (twelve) hours. (Patient taking differently: Take 15 mg by mouth every 12 (twelve) hours as needed for pain. ) 60 tablet 0  . oxyCODONE (OXY IR/ROXICODONE) 5 MG immediate release tablet Take 1 tablet (5 mg total) by mouth every 6 (six) hours as needed for severe pain. 90 tablet 0  . traMADol (ULTRAM) 50 MG tablet Take 1 tablet (50 mg total) by mouth every 6 (six) hours as needed. (Patient taking differently: Take 50 mg by mouth every 6 (six) hours as needed for moderate pain. ) 30 tablet 0  . VITAMIN E PO Take 1 tablet by mouth daily.    Marland Kitchen doxycycline (VIBRA-TABS) 100 MG tablet TAKE 1 TABLET(100 MG) BY MOUTH TWICE DAILY (Patient not taking: No sig reported) 60 tablet 0   No current facility-administered medications for this visit.     PHYSICAL EXAMINATION: ECOG PERFORMANCE STATUS: 1 - Symptomatic but completely ambulatory  Vitals:   06/07/19 0908  BP: 118/87  Pulse: (!) 108  Resp: 20  Temp: 98.3 F (36.8 C)  SpO2: 100%   Filed Weights   06/07/19 0908  Weight: 229 lb 3.2 oz (104 kg)    GENERAL:alert, no distress and comfortable SKIN: skin color, texture, turgor are normal, no rashes or significant lesions EYES: normal, Conjunctiva are pink and non-injected, sclera clear NECK: supple, thyroid normal size, non-tender, without nodularity LYMPH:  no palpable lymphadenopathy  in the cervical, axillary  LUNGS: clear to auscultation and percussion with normal breathing effort HEART: regular rate & rhythm and no murmurs and no lower extremity edema ABDOMEN:abdomen soft, non-tender and normal bowel sounds Musculoskeletal:no cyanosis of digits and no clubbing  NEURO: alert & oriented x 3 with fluent speech, no focal motor/sensory deficits  LABORATORY DATA:  I have reviewed the data as listed CBC Latest Ref Rng & Units 06/07/2019 06/02/2019 06/01/2019  WBC 4.0 - 10.5 K/uL 7.8 5.6 7.2  Hemoglobin 13.0 - 17.0 g/dL 9.2(L) 8.5(L) 9.4(L)  Hematocrit 39.0 - 52.0 % 29.9(L) 27.7(L) 30.6(L)  Platelets 150 - 400 K/uL 228 207 201     CMP Latest Ref Rng & Units 06/02/2019 05/31/2019 05/31/2019  Glucose 70 - 99 mg/dL 95 118(H) 117(H)  BUN 8 - 23 mg/dL _0 Creatinine 0.61 - 1.24 mg/dL 0.96 1.27(H) 1.15  Sodium 135 - 145 mmol/L 141 138 139  Potassium 3.5 - 5.1 mmol/L 3.3(L) 3.8 4.1  Chloride 98 - 111 mmol/L 110 107 107  CO2 22 - 32 mmol/L 22 18(L) 19(L)  Calcium 8.9 - 10.3 mg/dL 7.7(L) 8.4(L) 8.8(L)  Total Protein 6.5 - 8.1 g/dL - 6.8 6.9  Total Bilirubin 0.3 - 1.2 mg/dL - 0.7 0.4  Alkaline Phos 38 - 126 U/L - 261(H) 325(H)  AST 15 - 41  U/L - 21 18  ALT 0 - 44 U/L - 16 15      RADIOGRAPHIC STUDIES: I have personally reviewed the radiological images as listed and agreed with the findings in the report. No results found.   ASSESSMENT & PLAN:  Billy Mendoza is a 68 y.o. male with    1. Proximal rectal adenocarcinoma, metastatic to liver,lungsandnodes, MSS,KRAS/NRAS wild type,BRAF V600E mutation (+) -He was diagnosed in10/2019.He completed 1 cycle of CAPOX before changing todose reducedFOLFOXIRI andAvastindue to BRAF mutation in his tumor. -Hismetastatic diseaseis not curablewith surgery, he will continue treatment for as long as he can tolerate to control disease. -He was hospitalized on 01/18/19 forbowel obstruction due to rectal cancer. He had stent  placed by Dr. Benson Norway. His CT AP showed his known rectaltumor andobstruction, liver mets have decreased in size and multiple new right lung nodules consistent with metastasis. -I previously changed his chemo to second-lineoralBRAF/MEK/EGFR inhibitors(Braftovi, Mektovi andVectibix q2weeks)which he started on 02/01/19. -He received Radiation in May 2020 to his rectal tumor  -I personally reviewed his CT images from 05/31/19 shows slight increase in perirectal soft tissue and worsening pulmonary and liver disease with new mediastinal and periportal adrenopathy. I reviewed with patient in great detail. Overall he has had disease progression. Braftovi, Irean Hong has been stopped (06/01/19).  -I plan to switch him back to IV FOLFOXIRI with lower oxaliplatin dose 50% due to neuropathy, due to previous good response in liver.  -I discussed his standard treatments are options at this point is limited. I discussed option of clunial trail and I will start looking for trial options for him -Labs reviewed today, CBC and CMP WNL except Hg 9.2. Urine protein negative. Overall adequate to proceed with FOLFOXIRI tomorrow if it's approved by his insurance.  -will hold on Avastin for now due to his recent saddle PE -He requested a Chemo break from 9/16-/9/30 for his children visit him.   2. History of bowel obstruction from rectal tumor -s/p stent placement by Dr. Benson Norway on 01/19/2019,he declined loop colostomybut agree to keep surgery as last resort.  -He understands thehigh possibility of recurrent bowel obstruction due to tumor progression. -he received a course of radiation  -rectal pain mostly mild and manageable  3. Rectal bleeding -likely related to tumor bleeding and anticoagulation (Xarelto 20 mg daily)- bleeding improved with lower dose, he is on 76m daily now -Has occurred intermittently since diagnosis, improves for short period after treatments -Will watch his rectal bleeding on Lovenox  injections.  -He denies rectal bleeding since recent hospitalization.   4. Iron deficiency anemia -secondary to blood loss from tumor  -ser iron 29, TIBC 369, 8% saturation ratio, consistent with iron deficiency  -He can start prenatal vitamin with iron once daily -He receivedinitial 2 doses ofIV Feraheme on 5/20/20and 03/02/19.  -Hg at9.2 today (06/07/19)   5.History of right PE, Recent Acute saddle PE and left LE DVT in 05/2019 -His PE is likely related to his underlying metastatic colon cancer and chemotherapy, especially Avastin. -He is on Xarelto 20 mg daily and will continue indefinitely. Has occasionally epistaxis secondary to Avastin,thrombocytopenia and Xarelto. -The patient was previously on Xarelto for his history of PE, but was not taking it on a routine basis. He would stop it when he developed rectal bleeding and then resume it intermittently. Since he was not taking Xarelto as ordered, he did not fail Xarelto.  -He was treated with heparin as inpatient and was discharged with 1073mlovenox injections BID  -If  mild bleeding, especially GI bleeding he can decrease Lovenox 10-30%. If severe bleeding he will return to ED.  -His breathing has improved, but not back to baseline.   6. HTN  -TakesAmlodipine -BPusually controlled. Normal today at 118/87 today (06/07/19)  7. Acne-type skin rash -secondary toPanitumumab,developed after cycle 1 -Hasclindamycin gel BID and hydrocortisone and doxycycline 100 mg BID -Given his rash is very mild on face and scalp with doxycycline alone he has not been using topical treatment.  8. Goal of care discussion -He is full code now -He understands his disease is incurable,and the goal of therapy is palliative to prolong his life.   Plan -CT CAP reviewed, shows disease progression -Lab reviewed and adequate to proceed with FOLFOXIRI with oxaliplatin dose reduction to 50 mg/m, plan to start tomorrow if insurance approves  it  -Lab, flush, f/u and FOLFOXIRI in 2 weeks    No problem-specific Assessment & Plan notes found for this encounter.   No orders of the defined types were placed in this encounter.  All questions were answered. The patient knows to call the clinic with any problems, questions or concerns. No barriers to learning was detected. I spent 20 minutes counseling the patient face to face. The total time spent in the appointment was 20 minutes and more than 50% was on counseling and review of test results     Truitt Merle, MD 06/07/2019   I, Joslyn Devon, am acting as scribe for Truitt Merle, MD.   I have reviewed the above documentation for accuracy and completeness, and I agree with the above.

## 2019-06-05 NOTE — Progress Notes (Signed)
DISCONTINUE ON PATHWAY REGIMEN - Colorectal     A cycle is every 14 days:     Panitumumab   **Always confirm dose/schedule in your pharmacy ordering system**  REASON: Disease Progression PRIOR TREATMENT: COS78: Panitumumab 6 mg/kg q14 Days TREATMENT RESPONSE: Progressive Disease (PD)  START ON PATHWAY REGIMEN - Colorectal     A cycle is every 14 days:     Bevacizumab-xxxx      Irinotecan      Oxaliplatin      Leucovorin      Fluorouracil   **Always confirm dose/schedule in your pharmacy ordering system**  Patient Characteristics: Distant Metastases, Nonsurgical Candidate, BRAF V600 Mutation Positive (KRAS/NRAS Wild-Type), Standard Cytotoxic/Targeted Therapy, First Line Standard Cytotoxic/Targeted Therapy Tumor Location: Rectal Therapeutic Status: Distant Metastases Microsatellite/Mismatch Repair Status: MSS/pMMR BRAF Mutation Status: Mutation Positive KRAS/NRAS Mutation Status: Wild-Type (no mutation) Standard Cytotoxic/Targeted Line of Therapy: First Line Standard Cytotoxic/Targeted Therapy Intent of Therapy: Non-Curative / Palliative Intent, Discussed with Patient

## 2019-06-06 ENCOUNTER — Other Ambulatory Visit: Payer: Self-pay | Admitting: Hematology

## 2019-06-06 DIAGNOSIS — C20 Malignant neoplasm of rectum: Secondary | ICD-10-CM

## 2019-06-07 ENCOUNTER — Other Ambulatory Visit: Payer: Self-pay | Admitting: Hematology

## 2019-06-07 ENCOUNTER — Other Ambulatory Visit: Payer: Self-pay

## 2019-06-07 ENCOUNTER — Telehealth: Payer: Self-pay

## 2019-06-07 ENCOUNTER — Encounter: Payer: Self-pay | Admitting: Hematology

## 2019-06-07 ENCOUNTER — Inpatient Hospital Stay: Payer: BC Managed Care – PPO

## 2019-06-07 ENCOUNTER — Inpatient Hospital Stay: Payer: BC Managed Care – PPO | Attending: Adult Health | Admitting: Hematology

## 2019-06-07 ENCOUNTER — Telehealth: Payer: Self-pay | Admitting: *Deleted

## 2019-06-07 VITALS — BP 118/87 | HR 108 | Temp 98.3°F | Resp 20 | Ht 73.0 in | Wt 229.2 lb

## 2019-06-07 DIAGNOSIS — Z452 Encounter for adjustment and management of vascular access device: Secondary | ICD-10-CM | POA: Insufficient documentation

## 2019-06-07 DIAGNOSIS — I2510 Atherosclerotic heart disease of native coronary artery without angina pectoris: Secondary | ICD-10-CM | POA: Diagnosis not present

## 2019-06-07 DIAGNOSIS — C20 Malignant neoplasm of rectum: Secondary | ICD-10-CM

## 2019-06-07 DIAGNOSIS — I10 Essential (primary) hypertension: Secondary | ICD-10-CM | POA: Diagnosis not present

## 2019-06-07 DIAGNOSIS — Z95828 Presence of other vascular implants and grafts: Secondary | ICD-10-CM

## 2019-06-07 DIAGNOSIS — Z5189 Encounter for other specified aftercare: Secondary | ICD-10-CM | POA: Insufficient documentation

## 2019-06-07 DIAGNOSIS — D509 Iron deficiency anemia, unspecified: Secondary | ICD-10-CM | POA: Insufficient documentation

## 2019-06-07 DIAGNOSIS — K56609 Unspecified intestinal obstruction, unspecified as to partial versus complete obstruction: Secondary | ICD-10-CM | POA: Diagnosis not present

## 2019-06-07 DIAGNOSIS — R630 Anorexia: Secondary | ICD-10-CM | POA: Diagnosis not present

## 2019-06-07 DIAGNOSIS — C7802 Secondary malignant neoplasm of left lung: Secondary | ICD-10-CM | POA: Diagnosis not present

## 2019-06-07 DIAGNOSIS — C787 Secondary malignant neoplasm of liver and intrahepatic bile duct: Secondary | ICD-10-CM | POA: Diagnosis not present

## 2019-06-07 DIAGNOSIS — Z7901 Long term (current) use of anticoagulants: Secondary | ICD-10-CM | POA: Diagnosis not present

## 2019-06-07 DIAGNOSIS — C7801 Secondary malignant neoplasm of right lung: Secondary | ICD-10-CM | POA: Insufficient documentation

## 2019-06-07 DIAGNOSIS — L709 Acne, unspecified: Secondary | ICD-10-CM | POA: Insufficient documentation

## 2019-06-07 DIAGNOSIS — Z23 Encounter for immunization: Secondary | ICD-10-CM | POA: Diagnosis not present

## 2019-06-07 DIAGNOSIS — I2699 Other pulmonary embolism without acute cor pulmonale: Secondary | ICD-10-CM | POA: Insufficient documentation

## 2019-06-07 DIAGNOSIS — Z86711 Personal history of pulmonary embolism: Secondary | ICD-10-CM | POA: Insufficient documentation

## 2019-06-07 DIAGNOSIS — D696 Thrombocytopenia, unspecified: Secondary | ICD-10-CM | POA: Insufficient documentation

## 2019-06-07 DIAGNOSIS — R21 Rash and other nonspecific skin eruption: Secondary | ICD-10-CM | POA: Diagnosis not present

## 2019-06-07 DIAGNOSIS — Z5111 Encounter for antineoplastic chemotherapy: Secondary | ICD-10-CM | POA: Insufficient documentation

## 2019-06-07 DIAGNOSIS — I2692 Saddle embolus of pulmonary artery without acute cor pulmonale: Secondary | ICD-10-CM | POA: Diagnosis not present

## 2019-06-07 LAB — CMP (CANCER CENTER ONLY)
ALT: 21 U/L (ref 0–44)
AST: 25 U/L (ref 15–41)
Albumin: 2.3 g/dL — ABNORMAL LOW (ref 3.5–5.0)
Alkaline Phosphatase: 280 U/L — ABNORMAL HIGH (ref 38–126)
Anion gap: 12 (ref 5–15)
BUN: 9 mg/dL (ref 8–23)
CO2: 22 mmol/L (ref 22–32)
Calcium: 8.2 mg/dL — ABNORMAL LOW (ref 8.9–10.3)
Chloride: 109 mmol/L (ref 98–111)
Creatinine: 0.83 mg/dL (ref 0.61–1.24)
GFR, Est AFR Am: >60 mL/min (ref >60)
GFR, Estimated: >60 mL/min (ref >60)
Glucose, Bld: 103 mg/dL — ABNORMAL HIGH (ref 70–99)
Potassium: 3.6 mmol/L (ref 3.5–5.1)
Sodium: 143 mmol/L (ref 135–145)
Total Bilirubin: 0.3 mg/dL (ref 0.3–1.2)
Total Protein: 6.4 g/dL — ABNORMAL LOW (ref 6.5–8.1)

## 2019-06-07 LAB — CBC WITH DIFFERENTIAL (CANCER CENTER ONLY)
Abs Immature Granulocytes: 0.15 10*3/uL — ABNORMAL HIGH (ref 0.00–0.07)
Basophils Absolute: 0.1 10*3/uL (ref 0.0–0.1)
Basophils Relative: 1 %
Eosinophils Absolute: 0.1 10*3/uL (ref 0.0–0.5)
Eosinophils Relative: 2 %
HCT: 29.9 % — ABNORMAL LOW (ref 39.0–52.0)
Hemoglobin: 9.2 g/dL — ABNORMAL LOW (ref 13.0–17.0)
Immature Granulocytes: 2 %
Lymphocytes Relative: 12 %
Lymphs Abs: 0.9 10*3/uL (ref 0.7–4.0)
MCH: 30.5 pg (ref 26.0–34.0)
MCHC: 30.8 g/dL (ref 30.0–36.0)
MCV: 99 fL (ref 80.0–100.0)
Monocytes Absolute: 0.7 10*3/uL (ref 0.1–1.0)
Monocytes Relative: 9 %
Neutro Abs: 5.8 10*3/uL (ref 1.7–7.7)
Neutrophils Relative %: 74 %
Platelet Count: 228 10*3/uL (ref 150–400)
RBC: 3.02 MIL/uL — ABNORMAL LOW (ref 4.22–5.81)
RDW: 17 % — ABNORMAL HIGH (ref 11.5–15.5)
WBC Count: 7.8 10*3/uL (ref 4.0–10.5)
nRBC: 0.3 % — ABNORMAL HIGH (ref 0.0–0.2)

## 2019-06-07 LAB — TOTAL PROTEIN, URINE DIPSTICK: Protein, ur: NEGATIVE mg/dL

## 2019-06-07 LAB — MAGNESIUM: Magnesium: 1.2 mg/dL — CL (ref 1.7–2.4)

## 2019-06-07 MED ORDER — HEPARIN SOD (PORK) LOCK FLUSH 100 UNIT/ML IV SOLN
500.0000 [IU] | Freq: Once | INTRAVENOUS | Status: AC
  Administered 2019-06-07: 500 [IU] via INTRAVENOUS
  Filled 2019-06-07: qty 5

## 2019-06-07 MED ORDER — ALTEPLASE 2 MG IJ SOLR
2.0000 mg | Freq: Once | INTRAMUSCULAR | Status: DC | PRN
  Filled 2019-06-07: qty 2

## 2019-06-07 MED ORDER — SODIUM CHLORIDE 0.9% FLUSH
10.0000 mL | Freq: Once | INTRAVENOUS | Status: AC | PRN
  Administered 2019-06-07: 09:00:00 10 mL
  Filled 2019-06-07: qty 10

## 2019-06-07 NOTE — Telephone Encounter (Signed)
CRITICAL VALUE STICKER  CRITICAL VALUE:  Magnesium 1.2  RECEIVER (on-site recipient of call):  Valda Favia RN  DATE & TIME NOTIFIED: 06/07/2019 0945  MESSENGER (representative from lab): Roz from Triage   MD NOTIFIED:  Dr. Burr Medico  TIME OF NOTIFICATION:0950  RESPONSE: will add additional magnesium to infusion schedule 06/08/2019

## 2019-06-07 NOTE — Telephone Encounter (Signed)
Received call report from [facility].  "Today's Mg+ = 1.2."  Spoke with collaborative who verbalized results to notify provider of results.

## 2019-06-08 ENCOUNTER — Other Ambulatory Visit: Payer: Self-pay

## 2019-06-08 ENCOUNTER — Telehealth: Payer: Self-pay | Admitting: Hematology

## 2019-06-08 ENCOUNTER — Inpatient Hospital Stay: Payer: BC Managed Care – PPO

## 2019-06-08 VITALS — BP 134/87 | HR 82 | Temp 98.5°F | Resp 18

## 2019-06-08 DIAGNOSIS — C787 Secondary malignant neoplasm of liver and intrahepatic bile duct: Secondary | ICD-10-CM

## 2019-06-08 DIAGNOSIS — Z7189 Other specified counseling: Secondary | ICD-10-CM

## 2019-06-08 DIAGNOSIS — Z5111 Encounter for antineoplastic chemotherapy: Secondary | ICD-10-CM | POA: Diagnosis not present

## 2019-06-08 DIAGNOSIS — C20 Malignant neoplasm of rectum: Secondary | ICD-10-CM

## 2019-06-08 MED ORDER — MAGNESIUM SULFATE 4 GM/100ML IV SOLN
4.0000 g | Freq: Once | INTRAVENOUS | Status: AC
  Administered 2019-06-08: 4 g via INTRAVENOUS
  Filled 2019-06-08: qty 100

## 2019-06-08 MED ORDER — ACETAMINOPHEN 325 MG PO TABS
975.0000 mg | ORAL_TABLET | Freq: Once | ORAL | Status: AC
  Administered 2019-06-08: 975 mg via ORAL

## 2019-06-08 MED ORDER — ACETAMINOPHEN 325 MG PO TABS
ORAL_TABLET | ORAL | Status: AC
  Filled 2019-06-08: qty 3

## 2019-06-08 MED ORDER — IRINOTECAN HCL CHEMO INJECTION 100 MG/5ML
165.0000 mg/m2 | Freq: Once | INTRAVENOUS | Status: AC
  Administered 2019-06-08: 380 mg via INTRAVENOUS
  Filled 2019-06-08: qty 15

## 2019-06-08 MED ORDER — PALONOSETRON HCL INJECTION 0.25 MG/5ML
0.2500 mg | Freq: Once | INTRAVENOUS | Status: AC
  Administered 2019-06-08: 0.25 mg via INTRAVENOUS

## 2019-06-08 MED ORDER — ATROPINE SULFATE 1 MG/ML IJ SOLN
INTRAMUSCULAR | Status: AC
  Filled 2019-06-08: qty 1

## 2019-06-08 MED ORDER — SODIUM CHLORIDE 0.9 % IV SOLN
Freq: Once | INTRAVENOUS | Status: AC
  Administered 2019-06-08: 09:00:00 via INTRAVENOUS
  Filled 2019-06-08: qty 5

## 2019-06-08 MED ORDER — DEXTROSE 5 % IV SOLN
Freq: Once | INTRAVENOUS | Status: AC
  Administered 2019-06-08: 08:00:00 via INTRAVENOUS
  Filled 2019-06-08: qty 250

## 2019-06-08 MED ORDER — SODIUM CHLORIDE 0.9% FLUSH
10.0000 mL | INTRAVENOUS | Status: DC | PRN
  Filled 2019-06-08: qty 10

## 2019-06-08 MED ORDER — PALONOSETRON HCL INJECTION 0.25 MG/5ML
INTRAVENOUS | Status: AC
  Filled 2019-06-08: qty 5

## 2019-06-08 MED ORDER — LEUCOVORIN CALCIUM INJECTION 350 MG
400.0000 mg/m2 | Freq: Once | INTRAVENOUS | Status: AC
  Administered 2019-06-08: 920 mg via INTRAVENOUS
  Filled 2019-06-08: qty 46

## 2019-06-08 MED ORDER — HEPARIN SOD (PORK) LOCK FLUSH 100 UNIT/ML IV SOLN
500.0000 [IU] | Freq: Once | INTRAVENOUS | Status: DC | PRN
  Filled 2019-06-08: qty 5

## 2019-06-08 MED ORDER — OXALIPLATIN CHEMO INJECTION 100 MG/20ML
50.0000 mg/m2 | Freq: Once | INTRAVENOUS | Status: AC
  Administered 2019-06-08: 11:00:00 115 mg via INTRAVENOUS
  Filled 2019-06-08: qty 20

## 2019-06-08 MED ORDER — SODIUM CHLORIDE 0.9 % IV SOLN
2800.0000 mg/m2 | INTRAVENOUS | Status: DC
  Administered 2019-06-08: 6450 mg via INTRAVENOUS
  Filled 2019-06-08: qty 129

## 2019-06-08 MED ORDER — ATROPINE SULFATE 1 MG/ML IJ SOLN
0.5000 mg | Freq: Once | INTRAMUSCULAR | Status: AC | PRN
  Administered 2019-06-08: 0.5 mg via INTRAVENOUS

## 2019-06-08 NOTE — Telephone Encounter (Signed)
No los per 9/2. °

## 2019-06-08 NOTE — Patient Instructions (Signed)
[place] Discharge Instructions for Patients Receiving Chemotherapy  Today you received the following chemotherapy agents Oxaliplatin, Leucovorin, Irinotecan, 5FU  To help prevent nausea and vomiting after your treatment, we encourage you to take your nausea medication as prescribed.  If you develop nausea and vomiting that is not controlled by your nausea medication, call the clinic.   BELOW ARE SYMPTOMS THAT SHOULD BE REPORTED IMMEDIATELY:  *FEVER GREATER THAN 100.5 F  *CHILLS WITH OR WITHOUT FEVER  NAUSEA AND VOMITING THAT IS NOT CONTROLLED WITH YOUR NAUSEA MEDICATION  *UNUSUAL SHORTNESS OF BREATH  *UNUSUAL BRUISING OR BLEEDING  TENDERNESS IN MOUTH AND THROAT WITH OR WITHOUT PRESENCE OF ULCERS  *URINARY PROBLEMS  *BOWEL PROBLEMS  UNUSUAL RASH Items with * indicate a potential emergency and should be followed up as soon as possible.  Feel free to call the clinic should you have any questions or concerns. The clinic phone number is [phone].  Please show the [place] at check-in to the Emergency Department and triage nurse.

## 2019-06-10 ENCOUNTER — Inpatient Hospital Stay: Payer: BC Managed Care – PPO

## 2019-06-10 ENCOUNTER — Other Ambulatory Visit: Payer: Self-pay

## 2019-06-10 VITALS — BP 120/81 | HR 75 | Temp 98.5°F | Resp 16

## 2019-06-10 DIAGNOSIS — Z7189 Other specified counseling: Secondary | ICD-10-CM

## 2019-06-10 DIAGNOSIS — Z5111 Encounter for antineoplastic chemotherapy: Secondary | ICD-10-CM | POA: Diagnosis not present

## 2019-06-10 DIAGNOSIS — C20 Malignant neoplasm of rectum: Secondary | ICD-10-CM

## 2019-06-10 DIAGNOSIS — C787 Secondary malignant neoplasm of liver and intrahepatic bile duct: Secondary | ICD-10-CM

## 2019-06-10 MED ORDER — HEPARIN SOD (PORK) LOCK FLUSH 100 UNIT/ML IV SOLN
500.0000 [IU] | Freq: Once | INTRAVENOUS | Status: AC | PRN
  Administered 2019-06-10: 13:00:00 500 [IU]
  Filled 2019-06-10: qty 5

## 2019-06-10 MED ORDER — PEGFILGRASTIM-CBQV 6 MG/0.6ML ~~LOC~~ SOSY
6.0000 mg | PREFILLED_SYRINGE | Freq: Once | SUBCUTANEOUS | Status: AC
  Administered 2019-06-10: 13:00:00 6 mg via SUBCUTANEOUS

## 2019-06-10 MED ORDER — PEGFILGRASTIM-CBQV 6 MG/0.6ML ~~LOC~~ SOSY
PREFILLED_SYRINGE | SUBCUTANEOUS | Status: AC
  Filled 2019-06-10: qty 0.6

## 2019-06-10 MED ORDER — SODIUM CHLORIDE 0.9% FLUSH
10.0000 mL | INTRAVENOUS | Status: DC | PRN
  Administered 2019-06-10: 13:00:00 10 mL
  Filled 2019-06-10: qty 10

## 2019-06-10 NOTE — Patient Instructions (Signed)

## 2019-06-13 ENCOUNTER — Other Ambulatory Visit: Payer: Self-pay | Admitting: Hematology

## 2019-06-15 ENCOUNTER — Inpatient Hospital Stay: Payer: BC Managed Care – PPO

## 2019-06-15 ENCOUNTER — Other Ambulatory Visit: Payer: Self-pay

## 2019-06-15 ENCOUNTER — Inpatient Hospital Stay (HOSPITAL_BASED_OUTPATIENT_CLINIC_OR_DEPARTMENT_OTHER): Payer: BC Managed Care – PPO | Admitting: Medical

## 2019-06-15 ENCOUNTER — Ambulatory Visit: Payer: BC Managed Care – PPO

## 2019-06-15 ENCOUNTER — Telehealth: Payer: Self-pay

## 2019-06-15 ENCOUNTER — Other Ambulatory Visit: Payer: BC Managed Care – PPO

## 2019-06-15 ENCOUNTER — Ambulatory Visit: Payer: BC Managed Care – PPO | Admitting: Hematology

## 2019-06-15 VITALS — BP 118/79 | HR 102 | Temp 98.9°F | Resp 18 | Ht 73.0 in | Wt 224.7 lb

## 2019-06-15 DIAGNOSIS — I2699 Other pulmonary embolism without acute cor pulmonale: Secondary | ICD-10-CM

## 2019-06-15 DIAGNOSIS — D649 Anemia, unspecified: Secondary | ICD-10-CM | POA: Diagnosis not present

## 2019-06-15 DIAGNOSIS — C20 Malignant neoplasm of rectum: Secondary | ICD-10-CM

## 2019-06-15 DIAGNOSIS — C787 Secondary malignant neoplasm of liver and intrahepatic bile duct: Secondary | ICD-10-CM

## 2019-06-15 DIAGNOSIS — Z5111 Encounter for antineoplastic chemotherapy: Secondary | ICD-10-CM | POA: Diagnosis not present

## 2019-06-15 LAB — CBC WITH DIFFERENTIAL (CANCER CENTER ONLY)
Abs Immature Granulocytes: 0.08 10*3/uL — ABNORMAL HIGH (ref 0.00–0.07)
Basophils Absolute: 0 10*3/uL (ref 0.0–0.1)
Basophils Relative: 0 %
Eosinophils Absolute: 0.1 10*3/uL (ref 0.0–0.5)
Eosinophils Relative: 1 %
HCT: 23.2 % — ABNORMAL LOW (ref 39.0–52.0)
Hemoglobin: 7 g/dL — ABNORMAL LOW (ref 13.0–17.0)
Immature Granulocytes: 1 %
Lymphocytes Relative: 18 %
Lymphs Abs: 1.1 10*3/uL (ref 0.7–4.0)
MCH: 29.4 pg (ref 26.0–34.0)
MCHC: 30.2 g/dL (ref 30.0–36.0)
MCV: 97.5 fL (ref 80.0–100.0)
Monocytes Absolute: 0.5 10*3/uL (ref 0.1–1.0)
Monocytes Relative: 8 %
Neutro Abs: 4.2 10*3/uL (ref 1.7–7.7)
Neutrophils Relative %: 72 %
Platelet Count: 66 10*3/uL — ABNORMAL LOW (ref 150–400)
RBC: 2.38 MIL/uL — ABNORMAL LOW (ref 4.22–5.81)
RDW: 16.1 % — ABNORMAL HIGH (ref 11.5–15.5)
WBC Count: 5.9 10*3/uL (ref 4.0–10.5)
nRBC: 0.7 % — ABNORMAL HIGH (ref 0.0–0.2)

## 2019-06-15 LAB — CMP (CANCER CENTER ONLY)
ALT: 17 U/L (ref 0–44)
AST: 15 U/L (ref 15–41)
Albumin: 2.6 g/dL — ABNORMAL LOW (ref 3.5–5.0)
Alkaline Phosphatase: 286 U/L — ABNORMAL HIGH (ref 38–126)
Anion gap: 9 (ref 5–15)
BUN: 14 mg/dL (ref 8–23)
CO2: 23 mmol/L (ref 22–32)
Calcium: 8.5 mg/dL — ABNORMAL LOW (ref 8.9–10.3)
Chloride: 108 mmol/L (ref 98–111)
Creatinine: 0.97 mg/dL (ref 0.61–1.24)
GFR, Est AFR Am: >60 mL/min (ref >60)
GFR, Estimated: >60 mL/min (ref >60)
Glucose, Bld: 103 mg/dL — ABNORMAL HIGH (ref 70–99)
Potassium: 4 mmol/L (ref 3.5–5.1)
Sodium: 140 mmol/L (ref 135–145)
Total Bilirubin: 0.3 mg/dL (ref 0.3–1.2)
Total Protein: 6.4 g/dL — ABNORMAL LOW (ref 6.5–8.1)

## 2019-06-15 LAB — SAMPLE TO BLOOD BANK

## 2019-06-15 LAB — PREPARE RBC (CROSSMATCH)

## 2019-06-15 NOTE — Patient Instructions (Signed)

## 2019-06-15 NOTE — Progress Notes (Signed)
Pt aware to keep blue blood bracelet on for transfusions tomorrow.  Port dressing changed in order to stay accessed overnight, pt aware of precautions to keep port dry & clean.

## 2019-06-15 NOTE — Telephone Encounter (Signed)
Patient's wife [name] calls stating he has been having a lot of diarrhea, not eating much maybe one meal a day, he would like to come in for blood work to make sure every thing is okay.  Energy level is very poor unable to go to work today.     Called patient back to let him know to come in around 1:00 today for blood work/PF/see Sandi Mealy [facility] and possible IVF.  He verbalized an understanding and a scheduling message has been sent.

## 2019-06-15 NOTE — Progress Notes (Signed)
These preliminary result these preliminary results were noted.  Awaiting final report.

## 2019-06-15 NOTE — Progress Notes (Signed)
[facility] Progress Note   Billy Mendoza ZI:[id number] 1951-07-26 68 y.o.  Billy Mendoza is managed by [name]  Actively treated with chemotherapy/immunotherapy/hormonal therapy: yes  Current therapy: FOLFOXIRI and Vectibix q2weeks   Last treated: 06/07/2019 (cycle 1)  Next scheduled appointment with provider: 06/22/2019  Assessment: Plan:    Rectal cancer metastasized to liver ([place])  Symptomatic anemia - Plan: Practitioner attestation of consent, Complete patient signature process for consent form, Type and screen, Care order/instruction, Prepare RBC, Prepare RBC, Type and screen, DISCONTINUED: heparin lock flush 100 unit/mL, DISCONTINUED: 0.9 %  sodium chloride infusion (Manually program via Guardrails IV Fluids), DISCONTINUED: acetaminophen (TYLENOL) tablet 650 mg, DISCONTINUED: diphenhydrAMINE (BENADRYL) capsule 25 mg, CANCELED: Transfuse RBC  PE (pulmonary thromboembolism) ([place])   Metastatic rectal cancer with liver metastasis: Billy Mendoza continues to be followed by [name] and was most recently treated with FOLFOXIRI and Vectibix on 06/07/2019.  He is scheduled to be seen in follow-up on 06/22/2019.  Fatigue, anorexia and diarrhea: Billy Mendoza had a CBC completed today which returned showing a hemoglobin of 7.0 and hematocrit of 23.2.  Initially it was planned for the patient to return tomorrow for a transfusion of 2 units of packed red blood cells however the blood bank called saying that due to a rarity in the patient's blood he would only be able to receive 1 unit of packed red blood cells tomorrow.  Pulmonary emboli was suspected GI bleeding: The patient was told to stop oral iron so he will be able to see your clearly if he is having GI bleeding.  Additionally he was told to hold his Lovenox shots at night and to reduce his Lovenox dose from 100 mg to 80 mg twice daily beginning tomorrow.  Please see After Visit Summary for patient specific  instructions.  Future Appointments  Date Time Provider [place]  06/19/2019 12:15 PM [facility] LAB 4 [facility] None  06/19/2019 12:30 PM [facility] [place] FLUSH [facility] None  06/19/2019  1:30 PM [facility] INFUSION [facility] None  06/22/2019  9:45 AM [facility] ONLY [facility] None  06/22/2019 10:00 AM [facility] [place] FLUSH [facility] None  06/22/2019 10:20 AM Truitt Merle, MD [facility] None  06/22/2019 11:30 AM [facility] INFUSION [facility] None    Orders Placed This Encounter  Procedures   Practitioner attestation of consent   Complete patient signature process for consent form   Care order/instruction   Type and screen   Prepare RBC   ABO/Rh       Subjective:   Patient ID:  Billy Mendoza is a 67 y.o. (DOB 1951-01-10) male.  Chief Complaint:  Chief Complaint  Patient presents with   Fatigue    HPI Billy Mendoza  Is a 68 y.o. male with a diagnosis of a metastatic rectal cancer with liver metastasis. He continues to be followed by [name] and was most recently treated with FOLFOXIRI and Vectibix on 06/07/2019. He was recently hospitalized from 05/31/2019 to 06/02/2019 when he was diagnosed with an acute pulmonary emboli.  He is currently on Lovenox 100 mg twice daily.  His wife called the clinic this morning reporting that Billy Mendoza had increased fatigue, anorexia and diarrhea. A CBC, CMET and magnesium were collected.  He reports that he has had dark stools but is taking oral iron.  He has not seen any frank red blood.  Medications: I have reviewed the patient's current medications.  Allergies: No Known Allergies  Past Medical History:  Diagnosis Date  colorectal ca dx'd 07/2018   Liver metastases ([place]) dx'd 07/2018   Lung metastases ([place]) dx'd 07/2018    Past Surgical History:  Procedure Laterality Date   COLONIC STENT PLACEMENT N/A 01/19/2019   Procedure: COLONIC STENT PLACEMENT;  Surgeon: Carol Ada, MD;  Location: [facility];   Service: Endoscopy;  Laterality: N/A;   FLEXIBLE SIGMOIDOSCOPY N/A 01/19/2019   Procedure: FLEXIBLE SIGMOIDOSCOPY;  Surgeon: Carol Ada, MD;  Location: [facility];  Service: Endoscopy;  Laterality: N/A;   IR IMAGING GUIDED PORT INSERTION  08/16/2018   IR US GUIDE BX ASP/DRAIN  08/16/2018    Family History  Problem Relation Age of Onset   Cancer Mother 2       breast cancer   Atrial fibrillation Father    Heart failure Father    Cancer Maternal Grandmother        lung cancer     Social History   Socioeconomic History   Marital status: Married    Spouse name: Not on file   Number of children: Not on file   Years of education: Not on file   Highest education level: Not on file  Occupational History   Not on file  Social Needs   Financial resource strain: Not on file   Food insecurity    Worry: Not on file    Inability: Not on file   Transportation needs    Medical: No    Non-medical: No  Tobacco Use   Smoking status: Former Smoker    Years: 10.00    Types: Cigarettes, Cigars   Smokeless tobacco: Never Used   Tobacco comment: quit in Sep 2019   Substance and Sexual Activity   Alcohol use: Yes    Alcohol/week: 1.0 - 2.0 standard drinks    Types: 1 - 2 Shots of liquor per week   Drug use: Never   Sexual activity: Not on file  Lifestyle   Physical activity    Days per week: Not on file    Minutes per session: Not on file   Stress: Not on file  Relationships   Social connections    Talks on phone: Not on file    Gets together: Not on file    Attends religious service: Not on file    Active member of club or organization: Not on file    Attends meetings of clubs or organizations: Not on file    Relationship status: Not on file   Intimate partner violence    Fear of current or ex partner: Not on file    Emotionally abused: Not on file    Physically abused: Not on file    Forced sexual activity: Not on file  Other Topics Concern    Not on file  Social History Narrative   Not on file    Past Medical History, Surgical history, Social history, and Family history were reviewed and updated as appropriate.   Please see review of systems for further details on the patient's review from today.   Review of Systems:  Review of Systems  Constitutional: Positive for appetite change and fatigue. Negative for chills, diaphoresis and fever.  HENT: Negative for trouble swallowing.   Respiratory: Negative for cough, chest tightness and shortness of breath.   Cardiovascular: Negative for chest pain, palpitations and leg swelling.  Gastrointestinal: Positive for diarrhea. Negative for abdominal distention, abdominal pain, blood in stool, constipation, nausea and vomiting.       Dark stools  Genitourinary: Negative for decreased  urine volume and difficulty urinating.  Neurological: Positive for weakness.    Objective:   Physical Exam:  BP 118/79 (BP Location: Right Arm, Patient Position: Sitting)    Pulse (!) 102 Comment: Liza RN was notified   Temp 98.9 F (37.2 C)    Resp 18    Ht 6\' 1"  (1.854 m)    Wt 224 lb 11.2 oz (101.9 kg)    SpO2 100%    BMI 29.65 kg/m  ECOG: 1  Physical Exam Constitutional:      General: He is not in acute distress.    Appearance: He is not diaphoretic.  HENT:     Head: Normocephalic and atraumatic.  Eyes:     General: No scleral icterus.       Right eye: No discharge.        Left eye: No discharge.     Comments: Conjunctiva is pale  Cardiovascular:     Rate and Rhythm: Normal rate and regular rhythm.     Heart sounds: Normal heart sounds. No murmur. No friction rub. No gallop.   Pulmonary:     Effort: Pulmonary effort is normal. No respiratory distress.     Breath sounds: Normal breath sounds. No wheezing or rales.  Abdominal:     General: Abdomen is flat. Bowel sounds are normal. There is no distension.     Palpations: Abdomen is soft.     Tenderness: There is no abdominal tenderness.  There is no guarding.  Skin:    General: Skin is warm and dry.     Coloration: Skin is pale.     Findings: No erythema or rash.  Neurological:     Mental Status: He is alert.     Motor: No weakness.     Gait: Gait normal.  Psychiatric:        Mood and Affect: Mood normal.        Behavior: Behavior normal.        Thought Content: Thought content normal.        Judgment: Judgment normal.     Lab Review:     Component Value Date/Time   NA 140 06/15/2019 1333   K 4.0 06/15/2019 1333   CL 108 06/15/2019 1333   CO2 23 06/15/2019 1333   GLUCOSE 103 (H) 06/15/2019 1333   BUN 14 06/15/2019 1333   CREATININE 0.97 06/15/2019 1333   CALCIUM 8.5 (L) 06/15/2019 1333   PROT 6.4 (L) 06/15/2019 1333   ALBUMIN 2.6 (L) 06/15/2019 1333   AST 15 06/15/2019 1333   ALT 17 06/15/2019 1333   ALKPHOS 286 (H) 06/15/2019 1333   BILITOT 0.3 06/15/2019 1333   GFRNONAA >60 06/15/2019 1333   GFRAA >60 06/15/2019 1333       Component Value Date/Time   WBC 5.9 06/15/2019 1333   WBC 5.6 06/02/2019 0337   RBC 2.38 (L) 06/15/2019 1333   HGB 7.0 (L) 06/15/2019 1333   HCT 23.2 (L) 06/15/2019 1333   PLT 66 (L) 06/15/2019 1333   MCV 97.5 06/15/2019 1333   MCH 29.4 06/15/2019 1333   MCHC 30.2 06/15/2019 1333   RDW 16.1 (H) 06/15/2019 1333   LYMPHSABS 1.1 06/15/2019 1333   MONOABS 0.5 06/15/2019 1333   EOSABS 0.1 06/15/2019 1333   BASOSABS 0.0 06/15/2019 1333   -------------------------------  Imaging from last 24 hours (if applicable):  Radiology interpretation: Ct Chest W Contrast  Result Date: 05/31/2019 CLINICAL DATA:  Colorectal cancer staging with liver and lung metastases. Nausea  and vomiting. Radiation therapy complete in June. Ongoing chemotherapy. EXAM: CT CHEST, ABDOMEN, AND PELVIS WITH CONTRAST TECHNIQUE: Multidetector CT imaging of the chest, abdomen and pelvis was performed following the standard protocol during bolus administration of intravenous contrast. CONTRAST:  141mL OMNIPAQUE  IOHEXOL 300 MG/ML  SOLN COMPARISON:  CT chest 02/13/2019 and CT abdomen pelvis 01/18/2019. FINDINGS: CT CHEST FINDINGS Cardiovascular: Although the study was not performed to optimize for the detection of pulmonary emboli, there is a saddle embolus at the bifurcation of the right and left pulmonary arteries, with filling defects in the lobar pulmonary arteries bilaterally. Right IJ Port-A-Cath terminates in the low SVC, near the SVC RA junction. Atherosclerotic calcification of the aorta and coronary arteries. Heart size normal. No pericardial effusion. Mediastinum/Nodes: Lymph node anterior to the proximal left pulmonary artery measures 11 mm (2/28), increased from 4 mm. No additional pathologically enlarged mediastinal, hilar or axillary lymph nodes. Esophagus is unremarkable. Lungs/Pleura: Some pulmonary nodules have improved in the interval. For example, right middle lobe nodules have nearly completely resolved (6/96 and 100). Others have enlarged. Index nodule in the peripheral right lower lobe measures 12 mm (6/107), previously 5 mm. An 8 mm nodule in the apical left upper lobe (6/46) has also increased in size from 4 mm. No pleural fluid. Airway is unremarkable. Musculoskeletal: No worrisome lytic or sclerotic lesions. CT ABDOMEN PELVIS FINDINGS Hepatobiliary: Dominant heterogeneous low-attenuation lesion in the dome of the liver is similar in size, measuring 3.6 x 4.0 cm (2/42), previously 2.9 x 4.3 cm. Other lesions have increased in size, however. A 3.6 cm heterogeneous lesion slightly more inferiorly within the right hepatic lobe (2/48) is larger, previously measuring 1.6 cm. Index lesion in the left hepatic lobe measures 3.1 cm (2/53), previously 1.7 cm. Gallbladder is unremarkable. No biliary ductal dilatation. Pancreas: Negative. Spleen: Negative. Adrenals/Urinary Tract: Adrenal glands are unremarkable. Low-attenuation lesions in the kidneys measure up to 3.2 cm on the left and are likely cysts.  Kidneys are otherwise unremarkable. Ureters are decompressed. Bladder is unremarkable. Stomach/Bowel: Stomach, small bowel, appendix and majority of the colon are unremarkable. A metallic stent is seen in the rectosigmoid colon, where there is wall thickening and luminal narrowing. Adjacent soft tissue nodule along the left lateral margin of the rectosigmoid colon is slightly larger, measuring 1.5 cm (2/122), previously 12 mm. A fair amount of stool in the colon is indicative of constipation. Vascular/Lymphatic: Atherosclerotic calcification of the aorta without aneurysm. Periportal lymph node measures 1.1 cm (2/59), increased from 8 mm. A new low aortocaval lymph node measures 8 mm (2/79). Reproductive: Prostate is visualized. Other: Presacral edema is similar. Previously mentioned left perirectal soft tissue nodule appears slightly more prominent. There is a new nodule in the right perirectal fat, measuring 6 mm (2/116). Omental nodules in the right lateral abdomen measure up to 1.4 cm (2/69), slightly decreased in size from 1.6 cm. Small bilateral inguinal hernias contain fat. Small umbilical hernia contains fat. Musculoskeletal: No worrisome lytic or sclerotic lesions. IMPRESSION: 1. Pulmonary emboli with a saddle embolus. Critical Value/emergent results were called by telephone at the time of interpretation on 05/31/2019 at 12:03 pm to Cira Rue, NP , who verbally acknowledged these results. 2. Slight increase in perirectal soft tissue nodularity with overall worsening in pulmonary and hepatic metastatic disease. Omental nodularity appears slightly improved. 3. New mediastinal and periportal adenopathy. Aortocaval lymph node is not enlarged by CT size criteria but is new and therefore considered suspicious for metastatic involvement. 4. Aortic atherosclerosis (ICD10-170.0).  Coronary artery calcification. Electronically Signed   By: Lorin Picket M.D.   On: 05/31/2019 12:30   Ct Abdomen Pelvis W  Contrast  Result Date: 05/31/2019 CLINICAL DATA:  Colorectal cancer staging with liver and lung metastases. Nausea and vomiting. Radiation therapy complete in June. Ongoing chemotherapy. EXAM: CT CHEST, ABDOMEN, AND PELVIS WITH CONTRAST TECHNIQUE: Multidetector CT imaging of the chest, abdomen and pelvis was performed following the standard protocol during bolus administration of intravenous contrast. CONTRAST:  186mL OMNIPAQUE IOHEXOL 300 MG/ML  SOLN COMPARISON:  CT chest 02/13/2019 and CT abdomen pelvis 01/18/2019. FINDINGS: CT CHEST FINDINGS Cardiovascular: Although the study was not performed to optimize for the detection of pulmonary emboli, there is a saddle embolus at the bifurcation of the right and left pulmonary arteries, with filling defects in the lobar pulmonary arteries bilaterally. Right IJ Port-A-Cath terminates in the low SVC, near the SVC RA junction. Atherosclerotic calcification of the aorta and coronary arteries. Heart size normal. No pericardial effusion. Mediastinum/Nodes: Lymph node anterior to the proximal left pulmonary artery measures 11 mm (2/28), increased from 4 mm. No additional pathologically enlarged mediastinal, hilar or axillary lymph nodes. Esophagus is unremarkable. Lungs/Pleura: Some pulmonary nodules have improved in the interval. For example, right middle lobe nodules have nearly completely resolved (6/96 and 100). Others have enlarged. Index nodule in the peripheral right lower lobe measures 12 mm (6/107), previously 5 mm. An 8 mm nodule in the apical left upper lobe (6/46) has also increased in size from 4 mm. No pleural fluid. Airway is unremarkable. Musculoskeletal: No worrisome lytic or sclerotic lesions. CT ABDOMEN PELVIS FINDINGS Hepatobiliary: Dominant heterogeneous low-attenuation lesion in the dome of the liver is similar in size, measuring 3.6 x 4.0 cm (2/42), previously 2.9 x 4.3 cm. Other lesions have increased in size, however. A 3.6 cm heterogeneous lesion  slightly more inferiorly within the right hepatic lobe (2/48) is larger, previously measuring 1.6 cm. Index lesion in the left hepatic lobe measures 3.1 cm (2/53), previously 1.7 cm. Gallbladder is unremarkable. No biliary ductal dilatation. Pancreas: Negative. Spleen: Negative. Adrenals/Urinary Tract: Adrenal glands are unremarkable. Low-attenuation lesions in the kidneys measure up to 3.2 cm on the left and are likely cysts. Kidneys are otherwise unremarkable. Ureters are decompressed. Bladder is unremarkable. Stomach/Bowel: Stomach, small bowel, appendix and majority of the colon are unremarkable. A metallic stent is seen in the rectosigmoid colon, where there is wall thickening and luminal narrowing. Adjacent soft tissue nodule along the left lateral margin of the rectosigmoid colon is slightly larger, measuring 1.5 cm (2/122), previously 12 mm. A fair amount of stool in the colon is indicative of constipation. Vascular/Lymphatic: Atherosclerotic calcification of the aorta without aneurysm. Periportal lymph node measures 1.1 cm (2/59), increased from 8 mm. A new low aortocaval lymph node measures 8 mm (2/79). Reproductive: Prostate is visualized. Other: Presacral edema is similar. Previously mentioned left perirectal soft tissue nodule appears slightly more prominent. There is a new nodule in the right perirectal fat, measuring 6 mm (2/116). Omental nodules in the right lateral abdomen measure up to 1.4 cm (2/69), slightly decreased in size from 1.6 cm. Small bilateral inguinal hernias contain fat. Small umbilical hernia contains fat. Musculoskeletal: No worrisome lytic or sclerotic lesions. IMPRESSION: 1. Pulmonary emboli with a saddle embolus. Critical Value/emergent results were called by telephone at the time of interpretation on 05/31/2019 at 12:03 pm to Cira Rue, NP , who verbally acknowledged these results. 2. Slight increase in perirectal soft tissue nodularity with overall worsening in  pulmonary and  hepatic metastatic disease. Omental nodularity appears slightly improved. 3. New mediastinal and periportal adenopathy. Aortocaval lymph node is not enlarged by CT size criteria but is new and therefore considered suspicious for metastatic involvement. 4. Aortic atherosclerosis (ICD10-170.0). Coronary artery calcification. Electronically Signed   By: Lorin Picket M.D.   On: 05/31/2019 12:30   Vas [place] Lower Extremity Venous (dvt)  Result Date: 06/01/2019  Lower Venous Study Indications: Pulmonary embolism.  Risk Factors: Colorectal cancer with lung mets. Comparison Study: no prior Performing Technologist: June Leap RDMS, RVT  Examination Guidelines: A complete evaluation includes B-mode imaging, spectral Doppler, color Doppler, and power Doppler as needed of all accessible portions of each vessel. Bilateral testing is considered an integral part of a complete examination. Limited examinations for reoccurring indications may be performed as noted.  +---------+---------------+---------+-----------+----------+--------------+  RIGHT     Compressibility Phasicity Spontaneity Properties Thrombus Aging  +---------+---------------+---------+-----------+----------+--------------+  CFV       Full            Yes       Yes                                    +---------+---------------+---------+-----------+----------+--------------+  SFJ       Full                                                             +---------+---------------+---------+-----------+----------+--------------+  FV Prox   Full                                                             +---------+---------------+---------+-----------+----------+--------------+  FV Mid    Full                                                             +---------+---------------+---------+-----------+----------+--------------+  FV Distal Full                                                              +---------+---------------+---------+-----------+----------+--------------+  PFV       Full                                                             +---------+---------------+---------+-----------+----------+--------------+  POP       Full            Yes       Yes                                    +---------+---------------+---------+-----------+----------+--------------+  PTV       Full                                                             +---------+---------------+---------+-----------+----------+--------------+  PERO      Full                                                             +---------+---------------+---------+-----------+----------+--------------+   +---------------+---------------+---------+-----------+----------+-------------+  LEFT            Compressibility Phasicity Spontaneity Properties Thrombus                                                                         Aging          +---------------+---------------+---------+-----------+----------+-------------+  CFV             None            No        No                                    +---------------+---------------+---------+-----------+----------+-------------+  SFJ             None                                                            +---------------+---------------+---------+-----------+----------+-------------+  FV Prox         None                                                            +---------------+---------------+---------+-----------+----------+-------------+  FV Mid          None                                                            +---------------+---------------+---------+-----------+----------+-------------+  FV Distal       None                                                            +---------------+---------------+---------+-----------+----------+-------------+  PFV             None                                                             +---------------+---------------+---------+-----------+----------+-------------+  POP             None            No        No                                    +---------------+---------------+---------+-----------+----------+-------------+  PTV             Partial                                                         +---------------+---------------+---------+-----------+----------+-------------+  PERO                                                             Not                                                                              visualized     +---------------+---------------+---------+-----------+----------+-------------+  Ex Iliac                        Yes       Yes                    patent          mid-dist                                                                        +---------------+---------------+---------+-----------+----------+-------------+     Summary: Right: There is no evidence of deep vein thrombosis in the lower extremity. No cystic structure found in the popliteal fossa. Left: Findings consistent with acute deep vein thrombosis involving the left common femoral vein, left femoral vein, left proximal profunda vein, left popliteal vein, and left posterior tibial veins. No cystic structure found in the popliteal fossa.  *See table(s) above for measurements and observations. Electronically signed by Monica Martinez MD on 06/01/2019 at 4:56:21 PM.    Final         This case was discussed with Dr.  Burr Medico. She expressed agreement with my management of this patient.

## 2019-06-16 ENCOUNTER — Other Ambulatory Visit: Payer: Self-pay

## 2019-06-16 ENCOUNTER — Inpatient Hospital Stay: Payer: BC Managed Care – PPO

## 2019-06-16 ENCOUNTER — Telehealth: Payer: Self-pay | Admitting: Hematology

## 2019-06-16 DIAGNOSIS — D649 Anemia, unspecified: Secondary | ICD-10-CM

## 2019-06-16 DIAGNOSIS — Z5111 Encounter for antineoplastic chemotherapy: Secondary | ICD-10-CM | POA: Diagnosis not present

## 2019-06-16 LAB — ABO/RH: ABO/RH(D): O POS

## 2019-06-16 MED ORDER — ACETAMINOPHEN 325 MG PO TABS
ORAL_TABLET | ORAL | Status: AC
  Filled 2019-06-16: qty 2

## 2019-06-16 MED ORDER — DIPHENHYDRAMINE HCL 25 MG PO CAPS
ORAL_CAPSULE | ORAL | Status: AC
  Filled 2019-06-16: qty 1

## 2019-06-16 MED ORDER — SODIUM CHLORIDE 0.9% FLUSH
10.0000 mL | Freq: Once | INTRAVENOUS | Status: AC
  Administered 2019-06-16: 10 mL via INTRAVENOUS
  Filled 2019-06-16: qty 10

## 2019-06-16 MED ORDER — ACETAMINOPHEN 325 MG PO TABS
650.0000 mg | ORAL_TABLET | Freq: Once | ORAL | Status: AC
  Administered 2019-06-16: 650 mg via ORAL

## 2019-06-16 MED ORDER — HEPARIN SOD (PORK) LOCK FLUSH 100 UNIT/ML IV SOLN
250.0000 [IU] | INTRAVENOUS | Status: AC | PRN
  Administered 2019-06-16: 500 [IU]
  Filled 2019-06-16: qty 5

## 2019-06-16 MED ORDER — DIPHENHYDRAMINE HCL 25 MG PO CAPS
25.0000 mg | ORAL_CAPSULE | Freq: Once | ORAL | Status: AC
  Administered 2019-06-16: 25 mg via ORAL

## 2019-06-16 MED ORDER — SODIUM CHLORIDE 0.9% IV SOLUTION
250.0000 mL | Freq: Once | INTRAVENOUS | Status: AC
  Administered 2019-06-16: 13:00:00 250 mL via INTRAVENOUS
  Filled 2019-06-16: qty 250

## 2019-06-16 NOTE — Patient Instructions (Signed)
Blood Transfusion, Adult, Care After This sheet gives you information about how to care for yourself after your procedure. Your doctor may also give you more specific instructions. If you have problems or questions, contact your doctor. Follow these instructions at home:   Take over-the-counter and prescription medicines only as told by your doctor.  Go back to your normal activities as told by your doctor.  Follow instructions from your doctor about how to take care of the area where an IV tube was put into your vein (insertion site). Make sure you: ? Wash your hands with soap and water before you change your bandage (dressing). If there is no soap and water, use hand sanitizer. ? Change your bandage as told by your doctor.  Check your IV insertion site every day for signs of infection. Check for: ? More redness, swelling, or pain. ? More fluid or blood. ? Warmth. ? Pus or a bad smell. Contact a doctor if:  You have more redness, swelling, or pain around the IV insertion site.  You have more fluid or blood coming from the IV insertion site.  Your IV insertion site feels warm to the touch.  You have pus or a bad smell coming from the IV insertion site.  Your pee (urine) turns pink, red, or brown.  You feel weak after doing your normal activities. Get help right away if:  You have signs of a serious allergic or body defense (immune) system reaction, including: ? Itchiness. ? Hives. ? Trouble breathing. ? Anxiety. ? Pain in your chest or lower back. ? Fever, flushing, and chills. ? Fast pulse. ? Rash. ? Watery poop (diarrhea). ? Throwing up (vomiting). ? Dark pee. ? Serious headache. ? Dizziness. ? Stiff neck. ? Yellow color in your face or the white parts of your eyes (jaundice). Summary  After a blood transfusion, return to your normal activities as told by your doctor.  Every day, check for signs of infection where the IV tube was put into your vein.  Some  signs of infection are warm skin, more redness and pain, more fluid or blood, and pus or a bad smell where the needle went in.  Contact your doctor if you feel weak or have any unusual symptoms. This information is not intended to replace advice given to you by your health care provider. Make sure you discuss any questions you have with your health care provider. Document Released: 10/12/2014 Document Revised: 01/26/2018 Document Reviewed: 05/15/2016 Elsevier Patient Education  2020 Elsevier Inc.  

## 2019-06-16 NOTE — Telephone Encounter (Signed)
Left message re 9/14 appointments.

## 2019-06-18 LAB — BPAM RBC
Blood Product Expiration Date: 202010132359
ISSUE DATE / TIME: 202009111346
Unit Type and Rh: 5100

## 2019-06-18 LAB — TYPE AND SCREEN
ABO/RH(D): O POS
Antibody Screen: NEGATIVE
Unit division: 0

## 2019-06-19 ENCOUNTER — Other Ambulatory Visit: Payer: BC Managed Care – PPO

## 2019-06-19 ENCOUNTER — Telehealth: Payer: Self-pay

## 2019-06-19 NOTE — Progress Notes (Signed)
Billy Mendoza   Telephone:([phone]) [phone] [phone]) [phone]   Clinic Follow up Note   Patient Care Team: Merry Proud, MD as PCP - General (Internal Medicine) Truitt Merle, MD as Consulting Physician (Hematology) Carol Ada, MD as Consulting Physician (Gastroenterology)  Date of Service:  06/22/2019  CHIEF COMPLAINT: F/u of rectal cancer metastasized to liver  SUMMARY OF ONCOLOGIC HISTORY: Oncology History Overview Note  Cancer Staging Rectal cancer metastasized to liver [facility]) Staging form: Colon and Rectum, AJCC 8th Edition - Clinical stage from 07/28/2018: Stage IVB (cTX, cNX, pM1b) - Signed by Truitt Merle, MD on 08/18/2018     Rectal cancer metastasized to liver ([place])  07/28/2018 Initial Biopsy   Biopsy 07/28/18 Final Microscopic Diagnosis A.Colon, Ascending, Polyp:  -Fragments of tubular adenoma -No high-grade dysplasia of malignancy.  B. Rectum, Mass, Polyp:  -Invasive adenocarcinoma, moderately - poorly differentiatred.    07/28/2018 Cancer Staging   Staging form: Colon and Rectum, AJCC 8th Edition - Clinical stage from 07/28/2018: Stage IVB (cTX, cNX, pM1b) - Signed by Truitt Merle, MD on 08/18/2018   08/04/2018 Initial Diagnosis   Rectal cancer metastasized to liver ([place])   08/16/2018 Pathology Results   Biospy  Diagnosis 08/16/18 Liver, needle/core biopsy - ADENOCARCINOMA. Microscopic Comment By morphology, the findings are compatible with the provided clinical history of colorectal adenocarcinoma. Results reported to [name] on 08/18/2018. Intradepartmental consultation was obtained ([name]).    08/16/2018 Miscellaneous   Foundation 1 genomic testing: MS-stable Tumor mutation burden  3/Mb K-ras/NRAS wild-type BRAF V600 E KDMSA R266Q PTEN T343f* SMAD4 loss TP53 loss     08/18/2018 Procedure   Colonoscopy by Dr. HBenson Norwayshowed malignant appearing partially obstructing tumor in the rectum, 8 cm above anal verge.   08/18/2018  - 09/07/2018 Chemotherapy   CAPOX q3weeks with Xeloda 20049mBID 2 weeks on, 1 weeks off starting 08/18/18.  Swithced treatment due to BRAF mutation.    09/07/2018 - 12/24/2018 Chemotherapy   FOLFOXIRI and avastin every 2 weeks starting 09/07/18. Oxaliplatin held starting with cycle 8 and continue with FOLFIRI and Avastin. D/c due to disease progression.    11/04/2018 Imaging   CT CAP 11/04/18 IMPRESSION: 1. Peripheral filling defect in the right main pulmonary artery extending primarily into the right lower lobe pulmonary artery. While the peripheral location indicates some degree of chronicity of this pulmonary embolus, this is a new finding compared to 07/28/2018. The right ventricular to left ventricular ratio is 0.7, which does not specifically indicate right heart strain. 2. The malignancy appears considerably improved, with reduced size pulmonary nodules; reduced size and increased internal necrosis of the hepatic metastatic lesions; marked improvement in the thoracic adenopathy; reduced conspicuity of the rectal tumor; and significant reduction in perirectal tumor burden/adenopathy. 3. Other imaging findings of potential clinical significance: Aortic Atherosclerosis (ICD10-I70.0). Coronary atherosclerosis. Bilateral renal cysts. Small umbilical hernia contains adipose tissue.  Critical Value/emergent results were called by telephone at the time of interpretation on 11/04/2018 at 3:05 pm to Dr. YATruitt Merle who verbally acknowledged these results.   01/18/2019 Imaging   CT AP W contrast  IMPRESSION: 1. Colonic obstruction resulting from rectal carcinoma. 2. Hepatic metastatic disease which is decreased in size compared with the prior examination of 11/04/2018. 3. Multiple right middle lobe and right lower lobe pulmonary nodules most consistent with metastatic disease.   02/01/2019 - 06/01/2019 Chemotherapy   second line BRAFTOVI(encorafenib) 30027maily and MEKTOVI(binimetinib),  33m18m2h and Vectibix q2weeks starting 02/01/19.  -He has held  Mektovi 02/19/19-04/06/19 due to RT  -Stopped on 06/01/19 due to disease progression    02/13/2019 Imaging   CT Chest  IMPRESSION: Small bilateral pulmonary metastases, mildly progressive from January 2020.  Multifocal hepatic metastases, incompletely visualized, favored to be progressive from April 2020.  Aortic Atherosclerosis (ICD10-I70.0).    02/22/2019 - 03/21/2019 Radiation Therapy   -Palliative local radiation  -Holding mektovi (binimetinib) during RT -Due to significant rectal pain, diarrhea and stool incontinence, he stopped RT after 03/13/19. He was only able to finish 1/2 remianing treatments on 03/21/19.    05/31/2019 Imaging   CT CAP W contrast 05/31/19  IMPRESSION: 1. Pulmonary emboli with a saddle embolus. Critical Value/emergent results were called by telephone at the time of interpretation on 05/31/2019 at 12:03 pm to Cira Rue, NP , who verbally acknowledged these results. 2. Slight increase in perirectal soft tissue nodularity with overall worsening in pulmonary and hepatic metastatic disease. Omental nodularity appears slightly improved. 3. New mediastinal and periportal adenopathy. Aortocaval lymph node is not enlarged by CT size criteria but is new and therefore considered suspicious for metastatic involvement. 4. Aortic atherosclerosis (ICD10-170.0). Coronary artery calcification.   06/08/2019 -  Chemotherapy   FOLFOXIRI and Vectibix q2weeks starting 06/08/19      CURRENT THERAPY:  FOLFOXIRI and Vectibix q2weeks starting 06/08/19  INTERVAL HISTORY:  Billy Mendoza is here for a follow up and treatment. He presents to the clinic alone. He feels although his last chemo cycle was harder than others he feels he managed mostly. He notes he did not have cold sensitivity, but had 2 days of diarrhea and significant fatigue. He since last infusion he has recovered less than 70%. He was able to work half a  day yesterday. He works at [employer]. He notes only drops of rectal bleeding occasionally.  He wants to take a chemo break for most of October as he has his mother and daughters to see him.     REVIEW OF SYSTEMS:   Constitutional: Denies fevers, chills or abnormal weight loss Eyes: Denies blurriness of vision Ears, nose, mouth, throat, and face: Denies mucositis or sore throat Respiratory: Denies cough, dyspnea or wheezes Cardiovascular: Denies palpitation, chest discomfort or lower extremity swelling Gastrointestinal:  Denies nausea, heartburn or change in bowel habits (+) drops of rectal bleeding Skin: Denies abnormal skin rashes Lymphatics: Denies new lymphadenopathy or easy bruising Neurological:Denies numbness, tingling or new weaknesses Behavioral/Psych: Mood is stable, no new changes  All other systems were reviewed with the patient and are negative.  MEDICAL HISTORY:  Past Medical History:  Diagnosis Date   colorectal ca dx'd 07/2018   Liver metastases ([place]) dx'd 07/2018   Lung metastases ([place]) dx'd 07/2018    SURGICAL HISTORY: Past Surgical History:  Procedure Laterality Date   COLONIC STENT PLACEMENT N/A 01/19/2019   Procedure: COLONIC STENT PLACEMENT;  Surgeon: Carol Ada, MD;  Location: [facility];  Service: Endoscopy;  Laterality: N/A;   FLEXIBLE SIGMOIDOSCOPY N/A 01/19/2019   Procedure: FLEXIBLE SIGMOIDOSCOPY;  Surgeon: Carol Ada, MD;  Location: [facility];  Service: Endoscopy;  Laterality: N/A;   IR IMAGING GUIDED PORT INSERTION  08/16/2018   IR US GUIDE BX ASP/DRAIN  08/16/2018    I have reviewed the social history and family history with the patient and they are unchanged from previous note.  ALLERGIES:  has No Known Allergies.  MEDICATIONS:  Current Outpatient Medications  Medication Sig Dispense Refill   acetaminophen (TYLENOL) 325 MG tablet Take 650 mg  by mouth every 6 (six) hours as needed for moderate pain.      diphenoxylate-atropine (LOMOTIL) 2.5-0.025 MG tablet Take 1-2 tablets by mouth 4 (four) times daily as needed for diarrhea or loose stools. Up to 8 tabs daily for severe diarrhea 60 tablet 2   doxycycline (VIBRA-TABS) 100 MG tablet TAKE 1 TABLET(100 MG) BY MOUTH TWICE DAILY 60 tablet 0   enoxaparin (LOVENOX) 100 MG/ML injection Inject 1 mL (100 mg total) into the skin every 12 (twelve) hours. 62 mL 0   MAGNESIUM-OXIDE 400 (241.3 Mg) MG tablet TAKE 1 TABLET BY MOUTH DAILY (Patient taking differently: Take 200 mg by mouth 2 (two) times daily. ) 30 tablet 1   morphine (MS CONTIN) 15 MG 12 hr tablet Take 1 tablet (15 mg total) by mouth every 12 (twelve) hours. (Patient taking differently: Take 15 mg by mouth every 12 (twelve) hours as needed for pain. ) 60 tablet 0   oxyCODONE (OXY IR/ROXICODONE) 5 MG immediate release tablet Take 1 tablet (5 mg total) by mouth every 6 (six) hours as needed for severe pain. 90 tablet 0   traMADol (ULTRAM) 50 MG tablet Take 1 tablet (50 mg total) by mouth every 6 (six) hours as needed. (Patient taking differently: Take 50 mg by mouth every 6 (six) hours as needed for moderate pain. ) 30 tablet 0   VITAMIN E PO Take 1 tablet by mouth daily.     enoxaparin (LOVENOX) 150 MG/ML injection Inject 0.9 mLs (135 mg total) into the skin daily. 30 mL 1   Current Facility-Administered Medications  Medication Dose Route Frequency Provider Last Rate Last Dose   influenza vaccine adjuvanted (FLUAD) injection 0.5 mL  0.5 mL Intramuscular Once Truitt Merle, MD       Facility-Administered Medications Ordered in Other Visits  Medication Dose Route Frequency Provider Last Rate Last Dose   0.9 %  sodium chloride infusion (Manually program via Guardrails IV Fluids)  250 mL Intravenous Once Truitt Merle, MD       acetaminophen (TYLENOL) tablet 650 mg  650 mg Oral Once Truitt Merle, MD       dextrose 5 % solution   Intravenous Once Truitt Merle, MD       diphenhydrAMINE (BENADRYL) capsule 25  mg  25 mg Oral Once Truitt Merle, MD       fluorouracil (ADRUCIL) 5,500 mg in sodium chloride 0.9 % 140 mL chemo infusion  2,400 mg/m2 (Treatment Plan Recorded) Intravenous 1 day or 1 dose Truitt Merle, MD       irinotecan (CAMPTOSAR) 380 mg in dextrose 5 % 500 mL chemo infusion  165 mg/m2 (Treatment Plan Recorded) Intravenous Once Truitt Merle, MD 519 mL/hr at 06/22/19 1247 380 mg at 06/22/19 1247   leucovorin 920 mg in dextrose 5 % 250 mL infusion  400 mg/m2 (Treatment Plan Recorded) Intravenous Once Truitt Merle, MD        PHYSICAL EXAMINATION: ECOG PERFORMANCE STATUS: 1 - Symptomatic but completely ambulatory  Vitals:   06/22/19 1038  BP: 135/86  Pulse: 97  Resp: 19  Temp: 98.5 F (36.9 C)  SpO2: 100%   Filed Weights   06/22/19 1038  Weight: 229 lb 14.4 oz (104.3 kg)    GENERAL:alert, no distress and comfortable SKIN: skin color, texture, turgor are normal, no rashes or significant lesions EYES: normal, Conjunctiva are pink and non-injected, sclera clear  NECK: supple, thyroid normal size, non-tender, without nodularity LYMPH:  no palpable lymphadenopathy in the cervical, axillary  LUNGS: clear to auscultation and percussion with normal breathing effort HEART: regular rate & rhythm and no murmurs and no lower extremity edema ABDOMEN:abdomen soft, non-tender and normal bowel sounds Musculoskeletal:no cyanosis of digits and no clubbing  NEURO: alert & oriented x 3 with fluent speech, no focal motor/sensory deficits  LABORATORY DATA:  I have reviewed the data as listed CBC Latest Ref Rng & Units 06/22/2019 06/15/2019 06/07/2019  WBC 4.0 - 10.5 K/uL 9.5 5.9 7.8  Hemoglobin 13.0 - 17.0 g/dL 7.5(L) 7.0(L) 9.2(L)  Hematocrit 39.0 - 52.0 % 25.0(L) 23.2(L) 29.9(L)  Platelets 150 - 400 K/uL 92(L) 66(L) 228     CMP Latest Ref Rng & Units 06/22/2019 06/15/2019 06/07/2019  Glucose 70 - 99 mg/dL 110(H) 103(H) 103(H)  BUN 8 - 23 mg/dL 11 14 9   Creatinine 0.61 - 1.24 mg/dL 0.82 0.97 0.83  Sodium  135 - 145 mmol/L 143 140 143  Potassium 3.5 - 5.1 mmol/L 3.8 4.0 3.6  Chloride 98 - 111 mmol/L 114(H) 108 109  CO2 22 - 32 mmol/L 20(L) 23 22  Calcium 8.9 - 10.3 mg/dL 8.3(L) 8.5(L) 8.2(L)  Total Protein 6.5 - 8.1 g/dL 6.1(L) 6.4(L) 6.4(L)  Total Bilirubin 0.3 - 1.2 mg/dL <0.2(L) 0.3 0.3  Alkaline Phos 38 - 126 U/L 266(H) 286(H) 280(H)  AST 15 - 41 U/L 20 15 25   ALT 0 - 44 U/L 15 17 21       RADIOGRAPHIC STUDIES: I have personally reviewed the radiological images as listed and agreed with the findings in the report. No results found.   ASSESSMENT & PLAN:  Billy Mendoza is a 68 y.o. male with   1. Proximal rectal adenocarcinoma, metastatic to liver,lungsandnodes, MSS,KRAS/NRAS wild type,BRAF V600E mutation (+) -He was diagnosed in10/2019.He completed 1 cycle of CAPOX before changing todose reducedFOLFOXIRI andAvastindue to BRAF mutation in his tumor. -Hismetastatic diseaseis not curablewith surgery, he will continue treatment for as long as he can tolerate to control disease. -He was hospitalized on 01/18/19 forbowel obstruction due to rectal cancer. He had stent placed by Dr. Benson Norway. His CT AP showed his known rectaltumor andobstruction, liver mets have decreased in size and multiple new right lung nodules consistent with metastasis. -I previously changed his chemo to second-lineoralBRAF/MEK/EGFR inhibitors(Braftovi, Mektovi andVectibix q2weeks)which he started on 02/01/19. -He received Radiation in May 2020 to his rectal tumor  -CT images from 05/31/19 shows overall he has had disease progression. Braftovi, Irean Hong has been stopped (06/01/19).  -Do to previous good response in liver, I restarted him on IV FOLFOXIRI with lower oxaliplatin dose 50% due to neuropathy on 06/08/19. He has tolerated moderately well with fatigue, mild diarrhea and low blood counts. He did come in for IVF last week  -Labs reviewed,  CBC and CMP WNL except Hg 7.5, Plt 92K, ALP 266. Will proceed  with irinotecan and 5FU only today at dose reduction due to thrombocytopenia.  -Will start his chemo break for October due to his family event after next cycle.  -F/u in 2 weeks  2.History ofbowel obstruction from rectal tumor -s/p stent placement by Dr. Benson Norway on 01/19/2019,he declined loop colostomybut agree to keep surgery as last resort.  -He understands thehigh possibility of recurrent bowel obstruction due to tumor progression. -he received a course of radiation  -rectal pain mostly mild and manageable   3. Rectal bleeding -likely related to tumor bleeding and anticoagulation (Xarelto 20 mg daily)- bleeding improved with lower dose, he is on 41m daily now -Has occurred intermittently since diagnosis, improves  for short period after treatments -Will watch his rectal bleeding on Lovenox injections 27m BID at home which he started on 05/31/19. After completing 1 month treatment he can reduce to 1362monce a day, I called in for him today   -Since injections he has only had drops of rectal bleeding occasionally.   4. Iron deficiency anemia -secondary to blood loss from tumor  -Her prior iron 29, TIBC 369, 8% saturation ratio, consistent with iron deficiency  -He can start prenatal vitamin with iron once daily -He receivedinitial 2 doses ofIV Feraheme on 5/20/20and 03/02/19.  -He had a blood transfusion yesterday but Hg only at 7.5 today (06/22/19). Will give another unit of blood today. He is agreeable.    5.History of right PE, Recent Acute saddle PE and left LE DVT in 05/2019 -His PE is likely related to his underlying metastatic colon cancer and chemotherapy, especially Avastin. -He is on Xarelto 20 mg daily and will continue indefinitely. Has occasionally epistaxis secondary to Avastin,thrombocytopenia and Xarelto. -The patient was previously on Xarelto for his history of PE, but was not taking it on a routine basis. He would stop it when he developed rectal  bleeding and then resume it intermittently.Since he was not taking Xarelto as ordered, he did not fail Xarelto.  -He was treated with heparin as inpatient and was discharged with 10026movenox injections BID  -If mild bleeding, especially GI bleeding he can decrease Lovenox 10-30%. If severe bleeding he will return to ED.  -His breathing has improved, but not back to baseline.   6. HTN  -TakesAmlodipine -BPusually controlled   7. Acne-type skin rash -secondary toPanitumumab,developed after cycle 1 -resolved now since he stopped Vectibix   8. Goal of care discussion -He is full code now -He understands his disease is incurable,and the goal of therapy is palliative to prolong his life.   Plan -Lab reviewed and adequate to proceed with cycle 2 chemo. Due to thrombocytopenia, will hold oxaliplatin, and reduce 5-FU to 2400m50m, and continue  irinotecan -1 Unit blood infusion today  -Lab, flush, f/u and FOLFOXIRI in 2 and 6 weeks, he will take chemo break in 4 weeks due to family events    No problem-specific Assessment & Plan notes found for this encounter.   No orders of the defined types were placed in this encounter.  All questions were answered. The patient knows to call the clinic with any problems, questions or concerns. No barriers to learning was detected. I spent 20 minutes counseling the patient face to face. The total time spent in the appointment was 25 minutes and more than 50% was on counseling and review of test results     Vela Render Truitt Merle 06/22/2019   I, AmoyJoslyn Devon acting as scribe for Lavena Loretto Truitt Merle.   I have reviewed the above documentation for accuracy and completeness, and I agree with the above.

## 2019-06-19 NOTE — Telephone Encounter (Signed)
On 06/15/2019 faxed appeal letter to [facility] at [phone] sent to HIM for scan to chart.

## 2019-06-22 ENCOUNTER — Encounter: Payer: Self-pay | Admitting: Hematology

## 2019-06-22 ENCOUNTER — Other Ambulatory Visit: Payer: Self-pay

## 2019-06-22 ENCOUNTER — Inpatient Hospital Stay: Payer: BC Managed Care – PPO

## 2019-06-22 ENCOUNTER — Telehealth: Payer: Self-pay | Admitting: Hematology

## 2019-06-22 ENCOUNTER — Inpatient Hospital Stay (HOSPITAL_BASED_OUTPATIENT_CLINIC_OR_DEPARTMENT_OTHER): Payer: BC Managed Care – PPO | Admitting: Hematology

## 2019-06-22 VITALS — BP 135/86 | HR 97 | Temp 98.5°F | Resp 19 | Ht 73.0 in | Wt 229.9 lb

## 2019-06-22 VITALS — BP 119/76 | HR 60 | Temp 98.6°F | Resp 18

## 2019-06-22 DIAGNOSIS — C20 Malignant neoplasm of rectum: Secondary | ICD-10-CM

## 2019-06-22 DIAGNOSIS — C787 Secondary malignant neoplasm of liver and intrahepatic bile duct: Secondary | ICD-10-CM

## 2019-06-22 DIAGNOSIS — Z7189 Other specified counseling: Secondary | ICD-10-CM

## 2019-06-22 DIAGNOSIS — Z95828 Presence of other vascular implants and grafts: Secondary | ICD-10-CM

## 2019-06-22 DIAGNOSIS — Z23 Encounter for immunization: Secondary | ICD-10-CM | POA: Diagnosis not present

## 2019-06-22 DIAGNOSIS — Z5111 Encounter for antineoplastic chemotherapy: Secondary | ICD-10-CM | POA: Diagnosis not present

## 2019-06-22 DIAGNOSIS — I2699 Other pulmonary embolism without acute cor pulmonale: Secondary | ICD-10-CM | POA: Diagnosis not present

## 2019-06-22 DIAGNOSIS — D649 Anemia, unspecified: Secondary | ICD-10-CM

## 2019-06-22 LAB — CMP (CANCER CENTER ONLY)
ALT: 15 U/L (ref 0–44)
AST: 20 U/L (ref 15–41)
Albumin: 2.6 g/dL — ABNORMAL LOW (ref 3.5–5.0)
Alkaline Phosphatase: 266 U/L — ABNORMAL HIGH (ref 38–126)
Anion gap: 9 (ref 5–15)
BUN: 11 mg/dL (ref 8–23)
CO2: 20 mmol/L — ABNORMAL LOW (ref 22–32)
Calcium: 8.3 mg/dL — ABNORMAL LOW (ref 8.9–10.3)
Chloride: 114 mmol/L — ABNORMAL HIGH (ref 98–111)
Creatinine: 0.82 mg/dL (ref 0.61–1.24)
GFR, Est AFR Am: >60 mL/min (ref >60)
GFR, Estimated: >60 mL/min (ref >60)
Glucose, Bld: 110 mg/dL — ABNORMAL HIGH (ref 70–99)
Potassium: 3.8 mmol/L (ref 3.5–5.1)
Sodium: 143 mmol/L (ref 135–145)
Total Bilirubin: <0.2 mg/dL — ABNORMAL LOW (ref 0.3–1.2)
Total Protein: 6.1 g/dL — ABNORMAL LOW (ref 6.5–8.1)

## 2019-06-22 LAB — CBC WITH DIFFERENTIAL (CANCER CENTER ONLY)
Abs Immature Granulocytes: 0.52 10*3/uL — ABNORMAL HIGH (ref 0.00–0.07)
Basophils Absolute: 0.1 10*3/uL (ref 0.0–0.1)
Basophils Relative: 1 %
Eosinophils Absolute: 0.2 10*3/uL (ref 0.0–0.5)
Eosinophils Relative: 2 %
HCT: 25 % — ABNORMAL LOW (ref 39.0–52.0)
Hemoglobin: 7.5 g/dL — ABNORMAL LOW (ref 13.0–17.0)
Immature Granulocytes: 6 %
Lymphocytes Relative: 16 %
Lymphs Abs: 1.6 10*3/uL (ref 0.7–4.0)
MCH: 29.9 pg (ref 26.0–34.0)
MCHC: 30 g/dL (ref 30.0–36.0)
MCV: 99.6 fL (ref 80.0–100.0)
Monocytes Absolute: 0.8 10*3/uL (ref 0.1–1.0)
Monocytes Relative: 9 %
Neutro Abs: 6.4 10*3/uL (ref 1.7–7.7)
Neutrophils Relative %: 66 %
Platelet Count: 92 10*3/uL — ABNORMAL LOW (ref 150–400)
RBC: 2.51 MIL/uL — ABNORMAL LOW (ref 4.22–5.81)
RDW: 19.1 % — ABNORMAL HIGH (ref 11.5–15.5)
WBC Count: 9.5 10*3/uL (ref 4.0–10.5)
nRBC: 0.7 % — ABNORMAL HIGH (ref 0.0–0.2)

## 2019-06-22 LAB — PREPARE RBC (CROSSMATCH)

## 2019-06-22 MED ORDER — ACETAMINOPHEN 325 MG PO TABS
650.0000 mg | ORAL_TABLET | Freq: Once | ORAL | Status: AC
  Administered 2019-06-22: 650 mg via ORAL

## 2019-06-22 MED ORDER — PALONOSETRON HCL INJECTION 0.25 MG/5ML
INTRAVENOUS | Status: AC
  Filled 2019-06-22: qty 5

## 2019-06-22 MED ORDER — SODIUM CHLORIDE 0.9% FLUSH
10.0000 mL | INTRAVENOUS | Status: DC | PRN
  Administered 2019-06-22: 10:00:00 10 mL
  Filled 2019-06-22: qty 10

## 2019-06-22 MED ORDER — INFLUENZA VAC A&B SA ADJ QUAD 0.5 ML IM PRSY
PREFILLED_SYRINGE | INTRAMUSCULAR | Status: AC
  Filled 2019-06-22: qty 0.5

## 2019-06-22 MED ORDER — DIPHENHYDRAMINE HCL 25 MG PO CAPS
25.0000 mg | ORAL_CAPSULE | Freq: Once | ORAL | Status: AC
  Administered 2019-06-22: 14:00:00 25 mg via ORAL

## 2019-06-22 MED ORDER — PALONOSETRON HCL INJECTION 0.25 MG/5ML
0.2500 mg | Freq: Once | INTRAVENOUS | Status: AC
  Administered 2019-06-22: 0.25 mg via INTRAVENOUS

## 2019-06-22 MED ORDER — ATROPINE SULFATE 1 MG/ML IJ SOLN
INTRAMUSCULAR | Status: AC
  Filled 2019-06-22: qty 1

## 2019-06-22 MED ORDER — SODIUM CHLORIDE 0.9 % IV SOLN
Freq: Once | INTRAVENOUS | Status: AC
  Administered 2019-06-22: 12:00:00 via INTRAVENOUS
  Filled 2019-06-22: qty 5

## 2019-06-22 MED ORDER — SODIUM CHLORIDE 0.9 % IV SOLN
2400.0000 mg/m2 | INTRAVENOUS | Status: DC
  Administered 2019-06-22: 5500 mg via INTRAVENOUS
  Filled 2019-06-22: qty 110

## 2019-06-22 MED ORDER — SODIUM CHLORIDE 0.9% IV SOLUTION
250.0000 mL | Freq: Once | INTRAVENOUS | Status: AC
  Administered 2019-06-22: 250 mL via INTRAVENOUS
  Filled 2019-06-22: qty 250

## 2019-06-22 MED ORDER — ENOXAPARIN SODIUM 150 MG/ML ~~LOC~~ SOLN: 135.0000 mg | SUBCUTANEOUS | 1 refills | Status: DC

## 2019-06-22 MED ORDER — DEXTROSE 5 % IV SOLN
Freq: Once | INTRAVENOUS | Status: DC
  Filled 2019-06-22: qty 250

## 2019-06-22 MED ORDER — LEUCOVORIN CALCIUM INJECTION 350 MG
400.0000 mg/m2 | Freq: Once | INTRAVENOUS | Status: AC
  Administered 2019-06-22: 920 mg via INTRAVENOUS
  Filled 2019-06-22: qty 46

## 2019-06-22 MED ORDER — ATROPINE SULFATE 1 MG/ML IJ SOLN
0.5000 mg | Freq: Once | INTRAMUSCULAR | Status: AC | PRN
  Administered 2019-06-22: 0.5 mg via INTRAVENOUS

## 2019-06-22 MED ORDER — DEXTROSE 5 % IV SOLN
Freq: Once | INTRAVENOUS | Status: AC
  Administered 2019-06-22: 11:00:00 via INTRAVENOUS
  Filled 2019-06-22: qty 250

## 2019-06-22 MED ORDER — DIPHENHYDRAMINE HCL 25 MG PO CAPS
ORAL_CAPSULE | ORAL | Status: AC
  Filled 2019-06-22: qty 1

## 2019-06-22 MED ORDER — ACETAMINOPHEN 325 MG PO TABS
ORAL_TABLET | ORAL | Status: AC
  Filled 2019-06-22: qty 2

## 2019-06-22 MED ORDER — IRINOTECAN HCL CHEMO INJECTION 100 MG/5ML
165.0000 mg/m2 | Freq: Once | INTRAVENOUS | Status: AC
  Administered 2019-06-22: 13:00:00 380 mg via INTRAVENOUS
  Filled 2019-06-22: qty 15

## 2019-06-22 MED ORDER — INFLUENZA VAC A&B SA ADJ QUAD 0.5 ML IM PRSY
0.5000 mL | PREFILLED_SYRINGE | Freq: Once | INTRAMUSCULAR | Status: AC
  Administered 2019-06-22: 14:00:00 0.5 mL via INTRAMUSCULAR

## 2019-06-22 NOTE — Progress Notes (Signed)
Oxaliplatin has been removed from treatment plan today. In FOLFOXIRI, irinotecan goes first followed by oxali/leucovorin. Today still give irinotecan first over 60 minutes followed by leucovorin over 30 minutes (shortened duration d/t no oxali).   Demetrius Charity, PharmD, [facility] Pharmacist Pharmacy Phone: [phone] 06/22/2019

## 2019-06-22 NOTE — Progress Notes (Signed)
Per Kennith Center, RPH   If 5FU is started at 5:30 pm today and will infuse over 44 hours, 5FU pump rate should be 5.7 mL/hr in order to complete at 1:30 pm on Sat.

## 2019-06-22 NOTE — Progress Notes (Signed)
Per Dr. Burr Medico: OK to treat with platelets of 92 and hgb 7.5. She will hold his Oxaliplatin today and dose reduce the Irinotecan. Patient will also received 1 unit of PRBC. Called blood bank and confirmed that they can get a unit of blood ready for the patient today. Orders placed and prepare order released.   Per Dr. Burr Medico: OK to increase 5-FU pump rate to allow for pump to be D/C's on Saturday 06/24/19 by 13:30. Infusion RN and pharmacy updated on plan.  Still waiting for prior authorization on Granix injection. Will update appointment notes for pump D/C on Saturday 06/24/19 with information

## 2019-06-22 NOTE — Patient Instructions (Signed)
[place] Discharge Instructions for Patients Receiving Chemotherapy  Today you received the following chemotherapy agents Leucovorin, Irinotecan, 5FU  To help prevent nausea and vomiting after your treatment, we encourage you to take your nausea medication as prescribed.  If you develop nausea and vomiting that is not controlled by your nausea medication, call the clinic.   BELOW ARE SYMPTOMS THAT SHOULD BE REPORTED IMMEDIATELY:  *FEVER GREATER THAN 100.5 F  *CHILLS WITH OR WITHOUT FEVER  NAUSEA AND VOMITING THAT IS NOT CONTROLLED WITH YOUR NAUSEA MEDICATION  *UNUSUAL SHORTNESS OF BREATH  *UNUSUAL BRUISING OR BLEEDING  TENDERNESS IN MOUTH AND THROAT WITH OR WITHOUT PRESENCE OF ULCERS  *URINARY PROBLEMS  *BOWEL PROBLEMS  UNUSUAL RASH Items with * indicate a potential emergency and should be followed up as soon as possible.  Feel free to call the clinic should you have any questions or concerns. The clinic phone number is [phone].  Please show the [place] at check-in to the Emergency Department and triage nurse.     Blood Transfusion, Adult, Care After This sheet gives you information about how to care for yourself after your procedure. Your doctor may also give you more specific instructions. If you have problems or questions, contact your doctor. Follow these instructions at home:   Take over-the-counter and prescription medicines only as told by your doctor.  Go back to your normal activities as told by your doctor.  Follow instructions from your doctor about how to take care of the area where an IV tube was put into your vein (insertion site). Make sure you: ? Wash your hands with soap and water before you change your bandage (dressing). If there is no soap and water, use hand sanitizer. ? Change your bandage as told by your doctor.  Check your IV insertion site every day for signs of infection. Check for: ? More redness, swelling,  or pain. ? More fluid or blood. ? Warmth. ? Pus or a bad smell. Contact a doctor if:  You have more redness, swelling, or pain around the IV insertion site.  You have more fluid or blood coming from the IV insertion site.  Your IV insertion site feels warm to the touch.  You have pus or a bad smell coming from the IV insertion site.  Your pee (urine) turns pink, red, or brown.  You feel weak after doing your normal activities. Get help right away if:  You have signs of a serious allergic or body defense (immune) system reaction, including: ? Itchiness. ? Hives. ? Trouble breathing. ? Anxiety. ? Pain in your chest or lower back. ? Fever, flushing, and chills. ? Fast pulse. ? Rash. ? Watery poop (diarrhea). ? Throwing up (vomiting). ? Dark pee. ? Serious headache. ? Dizziness. ? Stiff neck. ? Yellow color in your face or the white parts of your eyes (jaundice). Summary  After a blood transfusion, return to your normal activities as told by your doctor.  Every day, check for signs of infection where the IV tube was put into your vein.  Some signs of infection are warm skin, more redness and pain, more fluid or blood, and pus or a bad smell where the needle went in.  Contact your doctor if you feel weak or have any unusual symptoms. This information is not intended to replace advice given to you by your health care provider. Make sure you discuss any questions you have with your health care provider. Document Released: 10/12/2014  Document Revised: 01/26/2018 Document Reviewed: 05/15/2016 Elsevier Patient Education  [place].

## 2019-06-22 NOTE — Telephone Encounter (Signed)
Scheduled appt per 9/17 los  Left a VM of appt date and time,

## 2019-06-23 LAB — TYPE AND SCREEN
ABO/RH(D): O POS
Antibody Screen: NEGATIVE
Unit division: 0

## 2019-06-23 LAB — BPAM RBC
Blood Product Expiration Date: 202010162359
ISSUE DATE / TIME: 202009171442
Unit Type and Rh: 5100

## 2019-06-24 ENCOUNTER — Other Ambulatory Visit: Payer: Self-pay

## 2019-06-24 ENCOUNTER — Inpatient Hospital Stay: Payer: BC Managed Care – PPO

## 2019-06-24 VITALS — BP 124/78 | HR 77 | Temp 98.7°F | Resp 17

## 2019-06-24 DIAGNOSIS — C787 Secondary malignant neoplasm of liver and intrahepatic bile duct: Secondary | ICD-10-CM

## 2019-06-24 DIAGNOSIS — Z5111 Encounter for antineoplastic chemotherapy: Secondary | ICD-10-CM | POA: Diagnosis not present

## 2019-06-24 DIAGNOSIS — C20 Malignant neoplasm of rectum: Secondary | ICD-10-CM

## 2019-06-24 DIAGNOSIS — Z7189 Other specified counseling: Secondary | ICD-10-CM

## 2019-06-24 MED ORDER — SODIUM CHLORIDE 0.9% FLUSH
10.0000 mL | INTRAVENOUS | Status: DC | PRN
  Administered 2019-06-24: 14:00:00 10 mL
  Filled 2019-06-24: qty 10

## 2019-06-24 MED ORDER — HEPARIN SOD (PORK) LOCK FLUSH 100 UNIT/ML IV SOLN
500.0000 [IU] | Freq: Once | INTRAVENOUS | Status: AC | PRN
  Administered 2019-06-24: 14:00:00 500 [IU]
  Filled 2019-06-24: qty 5

## 2019-06-24 MED ORDER — TBO-FILGRASTIM 480 MCG/0.8ML ~~LOC~~ SOSY
480.0000 ug | PREFILLED_SYRINGE | Freq: Once | SUBCUTANEOUS | Status: AC
  Administered 2019-06-24: 14:00:00 480 ug via SUBCUTANEOUS

## 2019-06-29 ENCOUNTER — Ambulatory Visit: Payer: BC Managed Care – PPO

## 2019-06-29 ENCOUNTER — Telehealth: Payer: Self-pay | Admitting: Hematology

## 2019-06-29 ENCOUNTER — Other Ambulatory Visit: Payer: BC Managed Care – PPO

## 2019-06-29 ENCOUNTER — Ambulatory Visit: Payer: BC Managed Care – PPO | Admitting: Hematology

## 2019-06-29 NOTE — Telephone Encounter (Signed)
Scheduled appt per 9/23 sch message - pt is aware of apt date and time

## 2019-07-04 ENCOUNTER — Other Ambulatory Visit: Payer: Self-pay | Admitting: Hematology

## 2019-07-04 ENCOUNTER — Telehealth: Payer: Self-pay | Admitting: *Deleted

## 2019-07-04 MED ORDER — MORPHINE SULFATE ER 15 MG PO TBCR: 15.0000 mg | EXTENDED_RELEASE_TABLET | Freq: Two times a day (BID) | ORAL | 0 refills | Status: DC | PRN

## 2019-07-04 NOTE — Telephone Encounter (Signed)
I spoke with pt and refilled for him.   Truitt Merle MD

## 2019-07-04 NOTE — Telephone Encounter (Signed)
TCT patient regarding refill request for his MS Contin 15 mg.No answer but was able to leave vm message on identified phone for pt to call back.  According to his med list, his MS Contin was last filled on 03/2919. His  Oxycodone was last filled on 06/05/19. Apparently he is asking for just the MSContin.  Awaiting call back to confirm

## 2019-07-04 NOTE — Progress Notes (Signed)
[place]   [phone] [phone]   Clinic Follow up Note   Patient Care Team: Merry Proud, MD as PCP - General (Internal Medicine) Truitt Merle, MD as Consulting Physician (Hematology) Carol Ada, MD as Consulting Physician (Gastroenterology)  Date of Service:  07/06/2019  CHIEF COMPLAINT: F/u of rectal cancer metastasized to liver  SUMMARY OF ONCOLOGIC HISTORY: Oncology History Overview Note  Cancer Staging Rectal cancer metastasized to liver [facility]) Staging form: Colon and Rectum, AJCC 8th Edition - Clinical stage from 07/28/2018: Stage IVB (cTX, cNX, pM1b) - Signed by Truitt Merle, MD on 08/18/2018     Rectal cancer metastasized to liver ([place])  07/28/2018 Initial Biopsy   Biopsy 07/28/18 Final Microscopic Diagnosis A.Colon, Ascending, Polyp:  -Fragments of tubular adenoma -No high-grade dysplasia of malignancy.  B. Rectum, Mass, Polyp:  -Invasive adenocarcinoma, moderately - poorly differentiatred.    07/28/2018 Cancer Staging   Staging form: Colon and Rectum, AJCC 8th Edition - Clinical stage from 07/28/2018: Stage IVB (cTX, cNX, pM1b) - Signed by Truitt Merle, MD on 08/18/2018   08/04/2018 Initial Diagnosis   Rectal cancer metastasized to liver ([place])   08/16/2018 Pathology Results   Biospy  Diagnosis 08/16/18 Liver, needle/core biopsy - ADENOCARCINOMA. Microscopic Comment By morphology, the findings are compatible with the provided clinical history of colorectal adenocarcinoma. Results reported to [name] on 08/18/2018. Intradepartmental consultation was obtained ([name]).    08/16/2018 Miscellaneous   Foundation 1 genomic testing: MS-stable Tumor mutation burden  3/Mb K-ras/NRAS wild-type BRAF V600 E KDMSA R266Q PTEN T363f* SMAD4 loss TP53 loss     08/18/2018 Procedure   Colonoscopy by Dr. HBenson Norwayshowed malignant appearing partially obstructing tumor in the rectum, 8 cm above anal verge.   08/18/2018  - 09/07/2018 Chemotherapy   CAPOX q3weeks with Xeloda '2000mg'$  BID 2 weeks on, 1 weeks off starting 08/18/18.  Swithced treatment due to BRAF mutation.    09/07/2018 - 12/24/2018 Chemotherapy   FOLFOXIRI and avastin every 2 weeks starting 09/07/18. Oxaliplatin held starting with cycle 8 and continue with FOLFIRI and Avastin. D/c due to disease progression.    11/04/2018 Imaging   CT CAP 11/04/18 IMPRESSION: 1. Peripheral filling defect in the right main pulmonary artery extending primarily into the right lower lobe pulmonary artery. While the peripheral location indicates some degree of chronicity of this pulmonary embolus, this is a new finding compared to 07/28/2018. The right ventricular to left ventricular ratio is 0.7, which does not specifically indicate right heart strain. 2. The malignancy appears considerably improved, with reduced size pulmonary nodules; reduced size and increased internal necrosis of the hepatic metastatic lesions; marked improvement in the thoracic adenopathy; reduced conspicuity of the rectal tumor; and significant reduction in perirectal tumor burden/adenopathy. 3. Other imaging findings of potential clinical significance: Aortic Atherosclerosis (ICD10-I70.0). Coronary atherosclerosis. Bilateral renal cysts. Small umbilical hernia contains adipose tissue.  Critical Value/emergent results were called by telephone at the time of interpretation on 11/04/2018 at 3:05 pm to Dr. YTruitt Merle, who verbally acknowledged these results.   01/18/2019 Imaging   CT AP W contrast  IMPRESSION: 1. Colonic obstruction resulting from rectal carcinoma. 2. Hepatic metastatic disease which is decreased in size compared with the prior examination of 11/04/2018. 3. Multiple right middle lobe and right lower lobe pulmonary nodules most consistent with metastatic disease.   02/01/2019 - 06/01/2019 Chemotherapy   second line BRAFTOVI(encorafenib) '300mg'$  daily and MEKTOVI(binimetinib),  '45mg'$  q12h and Vectibix q2weeks starting 02/01/19.  -He has held  Mektovi 02/19/19-04/06/19 due to RT  -Stopped on 06/01/19 due to disease progression    02/13/2019 Imaging   CT Chest  IMPRESSION: Small bilateral pulmonary metastases, mildly progressive from January 2020.  Multifocal hepatic metastases, incompletely visualized, favored to be progressive from April 2020.  Aortic Atherosclerosis (ICD10-I70.0).    02/22/2019 - 03/21/2019 Radiation Therapy   -Palliative local radiation  -Holding mektovi (binimetinib) during RT -Due to significant rectal pain, diarrhea and stool incontinence, he stopped RT after 03/13/19. He was only able to finish 1/2 remianing treatments on 03/21/19.    05/31/2019 Imaging   CT CAP W contrast 05/31/19  IMPRESSION: 1. Pulmonary emboli with a saddle embolus. Critical Value/emergent results were called by telephone at the time of interpretation on 05/31/2019 at 12:03 pm to Cira Rue, NP , who verbally acknowledged these results. 2. Slight increase in perirectal soft tissue nodularity with overall worsening in pulmonary and hepatic metastatic disease. Omental nodularity appears slightly improved. 3. New mediastinal and periportal adenopathy. Aortocaval lymph node is not enlarged by CT size criteria but is new and therefore considered suspicious for metastatic involvement. 4. Aortic atherosclerosis (ICD10-170.0). Coronary artery calcification.   06/08/2019 -  Chemotherapy   FOLFOXIRI q2weeks starting 06/08/19. Due to fatigue, diarrhea, nasuea and low blood counts he was switched to FOLFIRI starting 06/22/19. Postponed the addition of Avastin until C4 given recent PE.       CURRENT THERAPY:  FOLFOXIRI q2weeks starting 06/08/19. Due to fatigue, diarrhea, nausea and low blood counts he was switched to FOLFIRI starting 06/22/19. Postponed the addition of Avastin until C4 given recent PE.   INTERVAL HISTORY:  Jedi Catalfamo is here for a follow up and treatment. He  presents to the clinic alone. He notes with dose decrease his last cycle went better. He denies nausea and appetite is fair. He is able to maintain weight. He has not seen bleeding in the last few days. He notes very light GI bleeding occasionally. He denies any other bleeding. He notes he felt better after latest blood transfusion. He feels his energy is adequate overall. He denies chest pain. He continues to do daily Lovenox injection. He notes he will take 2-4 oxycodone a day. He will alternate with tylenol for breakthrough pain.    REVIEW OF SYSTEMS:   Constitutional: Denies fevers, chills (+) Fair appetite, stable weight  Eyes: Denies blurriness of vision Ears, nose, mouth, throat, and face: Denies mucositis or sore throat Respiratory: Denies cough, dyspnea or wheezes Cardiovascular: Denies palpitation, chest discomfort or lower extremity swelling Gastrointestinal:  Denies nausea, heartburn or change in bowel habits (+) Very little GI bleeding occasionally Skin: Denies abnormal skin rashes Lymphatics: Denies new lymphadenopathy or easy bruising Neurological:Denies numbness, tingling or new weaknesses Behavioral/Psych: Mood is stable, no new changes  All other systems were reviewed with the patient and are negative.  MEDICAL HISTORY:  Past Medical History:  Diagnosis Date  . colorectal ca dx'd 07/2018  . Liver metastases ([place]) dx'd 07/2018  . Lung metastases ([place]) dx'd 07/2018    SURGICAL HISTORY: Past Surgical History:  Procedure Laterality Date  . COLONIC STENT PLACEMENT N/A 01/19/2019   Procedure: COLONIC STENT PLACEMENT;  Surgeon: Carol Ada, MD;  Location: [facility];  Service: Endoscopy;  Laterality: N/A;  . FLEXIBLE SIGMOIDOSCOPY N/A 01/19/2019   Procedure: FLEXIBLE SIGMOIDOSCOPY;  Surgeon: Carol Ada, MD;  Location: [facility];  Service: Endoscopy;  Laterality: N/A;  . IR IMAGING GUIDED PORT INSERTION  08/16/2018  . IR US GUIDE BX ASP/DRAIN  08/16/2018    I have  reviewed the social history and family history with the patient and they are unchanged from previous note.  ALLERGIES:  has No Known Allergies.  MEDICATIONS:  Current Outpatient Medications  Medication Sig Dispense Refill  . acetaminophen (TYLENOL) 325 MG tablet Take 650 mg by mouth every 6 (six) hours as needed for moderate pain.    . diphenoxylate-atropine (LOMOTIL) 2.5-0.025 MG tablet Take 1-2 tablets by mouth 4 (four) times daily as needed for diarrhea or loose stools. Up to 8 tabs daily for severe diarrhea 60 tablet 2  . enoxaparin (LOVENOX) 150 MG/ML injection Inject 0.9 mLs (135 mg total) into the skin daily. 30 mL 1  . MAGNESIUM-OXIDE 400 (241.3 Mg) MG tablet TAKE 1 TABLET BY MOUTH DAILY (Patient taking differently: Take 200 mg by mouth 2 (two) times daily. ) 30 tablet 1  . morphine (MS CONTIN) 15 MG 12 hr tablet Take 1 tablet (15 mg total) by mouth every 12 (twelve) hours as needed for pain. 60 tablet 0  . oxyCODONE (OXY IR/ROXICODONE) 5 MG immediate release tablet Take 1 tablet (5 mg total) by mouth every 6 (six) hours as needed for severe pain. 60 tablet 0  . traMADol (ULTRAM) 50 MG tablet Take 1 tablet (50 mg total) by mouth every 6 (six) hours as needed. (Patient taking differently: Take 50 mg by mouth every 6 (six) hours as needed for moderate pain. ) 30 tablet 0  . VITAMIN E PO Take 1 tablet by mouth daily.    Marland Kitchen enoxaparin (LOVENOX) 100 MG/ML injection Inject 1 mL (100 mg total) into the skin every 12 (twelve) hours. 62 mL 0   No current facility-administered medications for this visit.    Facility-Administered Medications Ordered in Other Visits  Medication Dose Route Frequency Provider Last Rate Last Dose  . 0.9 %  sodium chloride infusion   Intravenous Continuous Truitt Merle, MD   Stopped at 07/06/19 1551  . fluorouracil (ADRUCIL) 5,500 mg in sodium chloride 0.9 % 140 mL chemo infusion  2,400 mg/m2 (Treatment Plan Recorded) Intravenous 1 day or 1 dose Truitt Merle, MD   5,500 mg at  07/06/19 1655  . heparin lock flush 100 unit/mL  500 Units Intracatheter Once PRN Truitt Merle, MD      . sodium chloride flush (NS) 0.9 % injection 10 mL  10 mL Intracatheter PRN Truitt Merle, MD        PHYSICAL EXAMINATION: ECOG PERFORMANCE STATUS: 2 - Symptomatic, <50% confined to bed Weight 229, blood pressure 140/90 pulse 92, respiratory rate 18, temperature 98.1 pulse ox 100% on room air GENERAL:alert, no distress and comfortable SKIN: skin color, texture, turgor are normal, no rashes or significant lesions EYES: normal, Conjunctiva are pink and non-injected, sclera clear  NECK: supple, thyroid normal size, non-tender, without nodularity LYMPH:  no palpable lymphadenopathy in the cervical, axillary  LUNGS: clear to auscultation and percussion with normal breathing effort HEART: regular rate & rhythm and no murmurs and no lower extremity edema ABDOMEN:abdomen soft, non-tender and normal bowel sounds Musculoskeletal:no cyanosis of digits and no clubbing  NEURO: alert & oriented x 3 with fluent speech, no focal motor/sensory deficits  LABORATORY DATA:  I have reviewed the data as listed CBC Latest Ref Rng & Units 07/06/2019 06/22/2019 06/15/2019  WBC 4.0 - 10.5 K/uL 2.7(L) 9.5 5.9  Hemoglobin 13.0 - 17.0 g/dL 7.7(L) 7.5(L) 7.0(L)  Hematocrit 39.0 - 52.0 % 25.5(L) 25.0(L) 23.2(L)  Platelets 150 - 400 K/uL 205 92(L)  66(L)     CMP Latest Ref Rng & Units 07/06/2019 06/22/2019 06/15/2019  Glucose 70 - 99 mg/dL 94 110(H) 103(H)  BUN 8 - 23 mg/dL _0 Creatinine 0.61 - 1.24 mg/dL 0.78 0.82 0.97  Sodium 135 - 145 mmol/L 138 143 140  Potassium 3.5 - 5.1 mmol/L 4.1 3.8 4.0  Chloride 98 - 111 mmol/L 108 114(H) 108  CO2 22 - 32 mmol/L 22 20(L) 23  Calcium 8.9 - 10.3 mg/dL 8.7(L) 8.3(L) 8.5(L)  Total Protein 6.5 - 8.1 g/dL 6.7 6.1(L) 6.4(L)  Total Bilirubin 0.3 - 1.2 mg/dL 0.3 <0.2(L) 0.3  Alkaline Phos 38 - 126 U/L 337(H) 266(H) 286(H)  AST 15 - 41 U/L _1 ALT 0 - 44 U/L _2 RADIOGRAPHIC STUDIES: I have personally reviewed the radiological images as listed and agreed with the findings in the report. No results found.   ASSESSMENT & PLAN:  Billy Mendoza is a 68 y.o. male with   1. Proximal rectal adenocarcinoma, metastatic to liver,lungsandnodes, MSS,KRAS/NRAS wild type,BRAF V600E mutation (+) -He was diagnosed in10/2019.He completed 1 cycle of CAPOX before changing todose reducedFOLFOXIRI andAvastindue to BRAF mutation in his tumor. -Hismetastatic diseaseis not curablewith surgery, he will continue treatment for as long as he can tolerate to control disease. -He was hospitalized on 01/18/19 forbowel obstruction due to rectal cancer. He had stent placed by Dr. Benson Norway. His CT AP showed his known rectaltumor andobstruction, liver mets have decreased in size and multiple new right lung nodules consistent with metastasis. -Ipreviouslychanged his chemo to second-lineoralBRAF/MEK/EGFR inhibitors(Braftovi, Mektovi andVectibix q2weeks)which he started on 02/01/19. -HereceivedRadiationin May 2020 to his rectal tumor -CTimagesfrom 05/31/19 shows overall he has had disease progression.Braftovi, Mektoviand vectibix have beenstopped(06/01/19). -Due to previous good response in liver, I restarted him on IV FOLFOXIRI with lower oxaliplatin dose 50% due to neuropathy on 06/08/19. -Due to fatigue, diarrhea, nausea and low blood counts he was switched to FOLFIRI starting 06/22/19. He tolerated reduced chemo much better, will continue  -will hold on Avastin due to his recent PE and rectal bleeding, may add it back next month -Labs reviewed, CBC and CMP WNL except WBC 2.7, Hg 7.7, ANC 1, ALP 337. Urine protein negative. Mag 1.7. Ferritin and CEA still pending. Will proceed with FOLFIRI today.  -Will start his chemo break after today for October due to his family event  -F/u in 4 weeks  2.History ofbowel obstruction from rectal tumor -s/p stent  placement by Dr. Benson Norway on 01/19/2019,he declined loop colostomybut agree to keep surgery as last resort.  -He understands thehigh possibility of recurrent bowel obstruction due to tumor progression. -he received a course of radiation -rectal pain mostly mild and manageable -He is currently on MS Contin 99m BID and Oxycodone 528m2-4 tabs a day or Tylenol for breakthrough pain. I refilled Oxycodone today (07/06/19)  3. Rectal bleeding -likely related to tumor bleeding and anticoagulation (Xarelto 20 mg daily)- bleeding improved with lower dose, he is on 1539maily now -Has occurred intermittently since diagnosis, improves for short period after treatments -Will watch his rectal bleeding on Lovenox injections 1m1mD at home which he startedon 05/31/19. After completing 1 month treatment he can reduce to 135mg73me a day (06/22/19).    -Since injections he has only had drops of rectal bleeding occasionally.   4. Iron deficiency anemia -secondary to blood loss from tumor  -Her prior iron 29, TIBC 369, 8% saturation ratio, consistent  with iron deficiency  -He can start prenatal vitamin with iron once daily -He receivedinitial 2 doses ofIV Feraheme on 5/20/20and 03/02/19.  -Last blood transfusion on 06/27/19 -Hg at 7.7 today (07/06/19). Ferritin still pending. He is mildly fatigued. May given another unit of blood in 1-2 weeks.    5.History of right PE, RecentAcute saddle PE and left LE DVTin 05/2019 -His PE is likely related to his underlying metastatic colon cancer and chemotherapy, especially Avastin. -He is on Xarelto 20 mg daily and will continue indefinitely. Has occasionally epistaxis secondary to Avastin,thrombocytopenia and Xarelto. -The patient was previously on Xarelto for his history of PE, but was not taking it on a routine basis. He would stop it when he developed rectal bleeding and then resume it intermittently.Since he was not taking Xarelto as ordered, he did not  fail Xarelto. -He was treated with heparin as inpatient and was discharged with 152m lovenox injections BID  -If mild bleeding,especiallyGI bleeding he can decrease Lovenox 10-30%. If severe bleedinghe will return to ED.  -His breathing has improved, but not back to baseline.   6. HTN  -TakesAmlodipine -BPusually controlled    7. Goal of care discussion -He is full code now -He understands his disease is incurable,and the goal of therapy is palliative to prolong his life.   Plan -I refilled oxycodone today  -labs reviewed and adequate to proceed with FOLFIRI today at same dose with Udenyca on day 3  -Lab, flush and Blood transfusion on 10/12 -Lab, flush, f/u and FOLFIRI in 4 weeks, he will take chemo break due to family events    No problem-specific Assessment & Plan notes found for this encounter.   No orders of the defined types were placed in this encounter.  All questions were answered. The patient knows to call the clinic with any problems, questions or concerns. No barriers to learning was detected. I spent 20 minutes counseling the patient face to face. The total time spent in the appointment was 25 minutes and more than 50% was on counseling and review of test results     YTruitt Merle MD 07/06/2019   I, AJoslyn Devon am acting as scribe for YTruitt Merle MD.   I have reviewed the above documentation for accuracy and completeness, and I agree with the above.

## 2019-07-06 ENCOUNTER — Inpatient Hospital Stay: Payer: BC Managed Care – PPO

## 2019-07-06 ENCOUNTER — Encounter: Payer: Self-pay | Admitting: Hematology

## 2019-07-06 ENCOUNTER — Other Ambulatory Visit: Payer: Self-pay

## 2019-07-06 ENCOUNTER — Telehealth: Payer: Self-pay | Admitting: Hematology

## 2019-07-06 ENCOUNTER — Inpatient Hospital Stay (HOSPITAL_BASED_OUTPATIENT_CLINIC_OR_DEPARTMENT_OTHER): Payer: BC Managed Care – PPO | Admitting: Hematology

## 2019-07-06 ENCOUNTER — Inpatient Hospital Stay: Payer: BC Managed Care – PPO | Attending: Adult Health

## 2019-07-06 VITALS — BP 125/78 | HR 79 | Temp 98.2°F | Resp 16

## 2019-07-06 DIAGNOSIS — C20 Malignant neoplasm of rectum: Secondary | ICD-10-CM

## 2019-07-06 DIAGNOSIS — I2699 Other pulmonary embolism without acute cor pulmonale: Secondary | ICD-10-CM

## 2019-07-06 DIAGNOSIS — I10 Essential (primary) hypertension: Secondary | ICD-10-CM | POA: Diagnosis not present

## 2019-07-06 DIAGNOSIS — Z5111 Encounter for antineoplastic chemotherapy: Secondary | ICD-10-CM | POA: Insufficient documentation

## 2019-07-06 DIAGNOSIS — C787 Secondary malignant neoplasm of liver and intrahepatic bile duct: Secondary | ICD-10-CM

## 2019-07-06 DIAGNOSIS — Z7189 Other specified counseling: Secondary | ICD-10-CM

## 2019-07-06 DIAGNOSIS — Z7901 Long term (current) use of anticoagulants: Secondary | ICD-10-CM | POA: Diagnosis not present

## 2019-07-06 DIAGNOSIS — Z95828 Presence of other vascular implants and grafts: Secondary | ICD-10-CM

## 2019-07-06 DIAGNOSIS — Z5189 Encounter for other specified aftercare: Secondary | ICD-10-CM | POA: Insufficient documentation

## 2019-07-06 DIAGNOSIS — D630 Anemia in neoplastic disease: Secondary | ICD-10-CM | POA: Insufficient documentation

## 2019-07-06 DIAGNOSIS — Z86718 Personal history of other venous thrombosis and embolism: Secondary | ICD-10-CM | POA: Diagnosis not present

## 2019-07-06 DIAGNOSIS — C7801 Secondary malignant neoplasm of right lung: Secondary | ICD-10-CM | POA: Diagnosis not present

## 2019-07-06 DIAGNOSIS — C7802 Secondary malignant neoplasm of left lung: Secondary | ICD-10-CM | POA: Diagnosis not present

## 2019-07-06 DIAGNOSIS — Z452 Encounter for adjustment and management of vascular access device: Secondary | ICD-10-CM | POA: Insufficient documentation

## 2019-07-06 LAB — CMP (CANCER CENTER ONLY)
ALT: 29 U/L (ref 0–44)
AST: 28 U/L (ref 15–41)
Albumin: 3 g/dL — ABNORMAL LOW (ref 3.5–5.0)
Alkaline Phosphatase: 337 U/L — ABNORMAL HIGH (ref 38–126)
Anion gap: 8 (ref 5–15)
BUN: 12 mg/dL (ref 8–23)
CO2: 22 mmol/L (ref 22–32)
Calcium: 8.7 mg/dL — ABNORMAL LOW (ref 8.9–10.3)
Chloride: 108 mmol/L (ref 98–111)
Creatinine: 0.78 mg/dL (ref 0.61–1.24)
GFR, Est AFR Am: >60 mL/min (ref >60)
GFR, Estimated: >60 mL/min (ref >60)
Glucose, Bld: 94 mg/dL (ref 70–99)
Potassium: 4.1 mmol/L (ref 3.5–5.1)
Sodium: 138 mmol/L (ref 135–145)
Total Bilirubin: 0.3 mg/dL (ref 0.3–1.2)
Total Protein: 6.7 g/dL (ref 6.5–8.1)

## 2019-07-06 LAB — CBC WITH DIFFERENTIAL (CANCER CENTER ONLY)
Abs Immature Granulocytes: 0.03 10*3/uL (ref 0.00–0.07)
Basophils Absolute: 0 10*3/uL (ref 0.0–0.1)
Basophils Relative: 1 %
Eosinophils Absolute: 0.1 10*3/uL (ref 0.0–0.5)
Eosinophils Relative: 3 %
HCT: 25.5 % — ABNORMAL LOW (ref 39.0–52.0)
Hemoglobin: 7.7 g/dL — ABNORMAL LOW (ref 13.0–17.0)
Immature Granulocytes: 1 %
Lymphocytes Relative: 32 %
Lymphs Abs: 0.8 10*3/uL (ref 0.7–4.0)
MCH: 30.6 pg (ref 26.0–34.0)
MCHC: 30.2 g/dL (ref 30.0–36.0)
MCV: 101.2 fL — ABNORMAL HIGH (ref 80.0–100.0)
Monocytes Absolute: 0.7 10*3/uL (ref 0.1–1.0)
Monocytes Relative: 26 %
Neutro Abs: 1 10*3/uL — ABNORMAL LOW (ref 1.7–7.7)
Neutrophils Relative %: 37 %
Platelet Count: 205 10*3/uL (ref 150–400)
RBC: 2.52 MIL/uL — ABNORMAL LOW (ref 4.22–5.81)
RDW: 20.4 % — ABNORMAL HIGH (ref 11.5–15.5)
WBC Count: 2.7 10*3/uL — ABNORMAL LOW (ref 4.0–10.5)
nRBC: 0 % (ref 0.0–0.2)

## 2019-07-06 LAB — FERRITIN: Ferritin: 104 ng/mL (ref 24–336)

## 2019-07-06 LAB — MAGNESIUM: Magnesium: 1.7 mg/dL (ref 1.7–2.4)

## 2019-07-06 LAB — CEA (IN HOUSE-CHCC): CEA (CHCC-In House): 61.11 ng/mL — ABNORMAL HIGH (ref 0.00–5.00)

## 2019-07-06 LAB — TOTAL PROTEIN, URINE DIPSTICK: Protein, ur: NEGATIVE mg/dL

## 2019-07-06 MED ORDER — SODIUM CHLORIDE 0.9% FLUSH
10.0000 mL | INTRAVENOUS | Status: DC | PRN
  Administered 2019-07-06: 11:00:00 10 mL
  Filled 2019-07-06: qty 10

## 2019-07-06 MED ORDER — PALONOSETRON HCL INJECTION 0.25 MG/5ML
0.2500 mg | Freq: Once | INTRAVENOUS | Status: AC
  Administered 2019-07-06: 0.25 mg via INTRAVENOUS

## 2019-07-06 MED ORDER — ATROPINE SULFATE 1 MG/ML IJ SOLN
INTRAMUSCULAR | Status: AC
  Filled 2019-07-06: qty 1

## 2019-07-06 MED ORDER — OXYCODONE HCL 5 MG PO TABS: 5.0000 mg | ORAL_TABLET | Freq: Four times a day (QID) | ORAL | 0 refills | Status: DC | PRN

## 2019-07-06 MED ORDER — HEPARIN SOD (PORK) LOCK FLUSH 100 UNIT/ML IV SOLN
500.0000 [IU] | Freq: Once | INTRAVENOUS | Status: DC | PRN
  Filled 2019-07-06: qty 5

## 2019-07-06 MED ORDER — LEUCOVORIN CALCIUM INJECTION 350 MG
400.0000 mg/m2 | Freq: Once | INTRAVENOUS | Status: AC
  Administered 2019-07-06: 920 mg via INTRAVENOUS
  Filled 2019-07-06: qty 46

## 2019-07-06 MED ORDER — SODIUM CHLORIDE 0.9 % IV SOLN
INTRAVENOUS | Status: DC
  Administered 2019-07-06: 13:00:00 via INTRAVENOUS
  Filled 2019-07-06: qty 250

## 2019-07-06 MED ORDER — IRINOTECAN HCL CHEMO INJECTION 100 MG/5ML
165.0000 mg/m2 | Freq: Once | INTRAVENOUS | Status: AC
  Administered 2019-07-06: 380 mg via INTRAVENOUS
  Filled 2019-07-06: qty 15

## 2019-07-06 MED ORDER — SODIUM CHLORIDE 0.9 % IV SOLN
Freq: Once | INTRAVENOUS | Status: AC
  Administered 2019-07-06: 14:00:00 via INTRAVENOUS
  Filled 2019-07-06: qty 5

## 2019-07-06 MED ORDER — SODIUM CHLORIDE 0.9% FLUSH
10.0000 mL | INTRAVENOUS | Status: DC | PRN
  Filled 2019-07-06: qty 10

## 2019-07-06 MED ORDER — SODIUM CHLORIDE 0.9 % IV SOLN
2400.0000 mg/m2 | INTRAVENOUS | Status: DC
  Administered 2019-07-06: 5500 mg via INTRAVENOUS
  Filled 2019-07-06: qty 110

## 2019-07-06 MED ORDER — ATROPINE SULFATE 1 MG/ML IJ SOLN
0.5000 mg | Freq: Once | INTRAMUSCULAR | Status: AC | PRN
  Administered 2019-07-06: 15:00:00 0.5 mg via INTRAVENOUS

## 2019-07-06 MED ORDER — PALONOSETRON HCL INJECTION 0.25 MG/5ML
INTRAVENOUS | Status: AC
  Filled 2019-07-06: qty 5

## 2019-07-06 NOTE — Telephone Encounter (Signed)
Scheduled appt per 10/1 los.  Spoke with pt and he is aware of the appt date and time.

## 2019-07-06 NOTE — Progress Notes (Signed)
Per Dr. Burr Medico okay to treat with ANC 1.0

## 2019-07-06 NOTE — Patient Instructions (Signed)
[facility] Discharge Instructions for Patients Receiving Chemotherapy  Today you received the following chemotherapy agents Leucovorin, Irinotecan, 5FU  To help prevent nausea and vomiting after your treatment, we encourage you to take your nausea medication as prescribed.  If you develop nausea and vomiting that is not controlled by your nausea medication, call the clinic.   BELOW ARE SYMPTOMS THAT SHOULD BE REPORTED IMMEDIATELY:  *FEVER GREATER THAN 100.5 F  *CHILLS WITH OR WITHOUT FEVER  NAUSEA AND VOMITING THAT IS NOT CONTROLLED WITH YOUR NAUSEA MEDICATION  *UNUSUAL SHORTNESS OF BREATH  *UNUSUAL BRUISING OR BLEEDING  TENDERNESS IN MOUTH AND THROAT WITH OR WITHOUT PRESENCE OF ULCERS  *URINARY PROBLEMS  *BOWEL PROBLEMS  UNUSUAL RASH Items with * indicate a potential emergency and should be followed up as soon as possible.  Feel free to call the clinic should you have any questions or concerns. The clinic phone number is [phone].  Please show the [place] at check-in to the Emergency Department and triage nurse.     Blood Transfusion, Adult, Care After This sheet gives you information about how to care for yourself after your procedure. Your doctor may also give you more specific instructions. If you have problems or questions, contact your doctor. Follow these instructions at home:   Take over-the-counter and prescription medicines only as told by your doctor.  Go back to your normal activities as told by your doctor.  Follow instructions from your doctor about how to take care of the area where an IV tube was put into your vein (insertion site). Make sure you: ? Wash your hands with soap and water before you change your bandage (dressing). If there is no soap and water, use hand sanitizer. ? Change your bandage as told by your doctor.  Check your IV insertion site every day for signs of infection. Check for: ? More redness, swelling,  or pain. ? More fluid or blood. ? Warmth. ? Pus or a bad smell. Contact a doctor if:  You have more redness, swelling, or pain around the IV insertion site.  You have more fluid or blood coming from the IV insertion site.  Your IV insertion site feels warm to the touch.  You have pus or a bad smell coming from the IV insertion site.  Your pee (urine) turns pink, red, or brown.  You feel weak after doing your normal activities. Get help right away if:  You have signs of a serious allergic or body defense (immune) system reaction, including: ? Itchiness. ? Hives. ? Trouble breathing. ? Anxiety. ? Pain in your chest or lower back. ? Fever, flushing, and chills. ? Fast pulse. ? Rash. ? Watery poop (diarrhea). ? Throwing up (vomiting). ? Dark pee. ? Serious headache. ? Dizziness. ? Stiff neck. ? Yellow color in your face or the white parts of your eyes (jaundice). Summary  After a blood transfusion, return to your normal activities as told by your doctor.  Every day, check for signs of infection where the IV tube was put into your vein.  Some signs of infection are warm skin, more redness and pain, more fluid or blood, and pus or a bad smell where the needle went in.  Contact your doctor if you feel weak or have any unusual symptoms. This information is not intended to replace advice given to you by your health care provider. Make sure you discuss any questions you have with your health care provider. Document Released: 10/12/2014  Document Revised: 01/26/2018 Document Reviewed: 05/15/2016 Elsevier Patient Education  [place].

## 2019-07-07 ENCOUNTER — Telehealth: Payer: Self-pay

## 2019-07-07 NOTE — Telephone Encounter (Signed)
refaxed appeal letter to [facility] fax [phone] for [employer]den[employer].

## 2019-07-08 ENCOUNTER — Inpatient Hospital Stay: Payer: BC Managed Care – PPO

## 2019-07-08 ENCOUNTER — Other Ambulatory Visit: Payer: Self-pay

## 2019-07-08 VITALS — BP 125/79 | HR 81 | Temp 98.7°F | Resp 18

## 2019-07-08 DIAGNOSIS — Z5111 Encounter for antineoplastic chemotherapy: Secondary | ICD-10-CM | POA: Diagnosis not present

## 2019-07-08 DIAGNOSIS — C20 Malignant neoplasm of rectum: Secondary | ICD-10-CM

## 2019-07-08 DIAGNOSIS — Z7189 Other specified counseling: Secondary | ICD-10-CM

## 2019-07-08 DIAGNOSIS — C787 Secondary malignant neoplasm of liver and intrahepatic bile duct: Secondary | ICD-10-CM

## 2019-07-08 MED ORDER — PEGFILGRASTIM-CBQV 6 MG/0.6ML ~~LOC~~ SOSY
6.0000 mg | PREFILLED_SYRINGE | Freq: Once | SUBCUTANEOUS | Status: AC
  Administered 2019-07-08: 6 mg via SUBCUTANEOUS

## 2019-07-08 MED ORDER — SODIUM CHLORIDE 0.9% FLUSH
10.0000 mL | INTRAVENOUS | Status: DC | PRN
  Administered 2019-07-08: 13:00:00 10 mL
  Filled 2019-07-08: qty 10

## 2019-07-08 MED ORDER — HEPARIN SOD (PORK) LOCK FLUSH 100 UNIT/ML IV SOLN
500.0000 [IU] | Freq: Once | INTRAVENOUS | Status: AC | PRN
  Administered 2019-07-08: 500 [IU]
  Filled 2019-07-08: qty 5

## 2019-07-08 NOTE — Patient Instructions (Addendum)
Pegfilgrastim injection What is this medicine? PEGFILGRASTIM (PEG fil gra stim) is a long-acting granulocyte colony-stimulating factor that stimulates the growth of neutrophils, a type of white blood cell important in the body's fight against infection. It is used to reduce the incidence of fever and infection in patients with certain types of cancer who are receiving chemotherapy that affects the bone marrow, and to increase survival after being exposed to high doses of radiation. This medicine may be used for other purposes; ask your health care provider or pharmacist if you have questions. COMMON BRAND NAME(S): Fulphila, Neulasta, UDENYCA, Ziextenzo What should I tell my health care provider before I take this medicine? They need to know if you have any of these conditions:  kidney disease  latex allergy  ongoing radiation therapy  sickle cell disease  skin reactions to acrylic adhesives (On-Body Injector only)  an unusual or allergic reaction to pegfilgrastim, filgrastim, other medicines, foods, dyes, or preservatives  pregnant or trying to get pregnant  breast-feeding How should I use this medicine? This medicine is for injection under the skin. If you get this medicine at home, you will be taught how to prepare and give the pre-filled syringe or how to use the On-body Injector. Refer to the patient Instructions for Use for detailed instructions. Use exactly as directed. Tell your healthcare provider immediately if you suspect that the On-body Injector may not have performed as intended or if you suspect the use of the On-body Injector resulted in a missed or partial dose. It is important that you put your used needles and syringes in a special sharps container. Do not put them in a trash can. If you do not have a sharps container, call your pharmacist or healthcare provider to get one. Talk to your pediatrician regarding the use of this medicine in children. While this drug may be  prescribed for selected conditions, precautions do apply. Overdosage: If you think you have taken too much of this medicine contact a poison control center or emergency room at once. NOTE: This medicine is only for you. Do not share this medicine with others. What if I miss a dose? It is important not to miss your dose. Call your doctor or health care professional if you miss your dose. If you miss a dose due to an On-body Injector failure or leakage, a new dose should be administered as soon as possible using a single prefilled syringe for manual use. What may interact with this medicine? Interactions have not been studied. Give your health care provider a list of all the medicines, herbs, non-prescription drugs, or dietary supplements you use. Also tell them if you smoke, drink alcohol, or use illegal drugs. Some items may interact with your medicine. This list may not describe all possible interactions. Give your health care provider a list of all the medicines, herbs, non-prescription drugs, or dietary supplements you use. Also tell them if you smoke, drink alcohol, or use illegal drugs. Some items may interact with your medicine. What should I watch for while using this medicine? You may need blood work done while you are taking this medicine. If you are going to need a MRI, CT scan, or other procedure, tell your doctor that you are using this medicine (On-Body Injector only). What side effects may I notice from receiving this medicine? Side effects that you should report to your doctor or health care professional as soon as possible:  allergic reactions like skin rash, itching or hives, swelling of the   face, lips, or tongue  back pain  dizziness  fever  pain, redness, or irritation at site where injected  pinpoint red spots on the skin  red or dark-brown urine  shortness of breath or breathing problems  stomach or side pain, or pain at the  shoulder  swelling  tiredness  trouble passing urine or change in the amount of urine Side effects that usually do not require medical attention (report to your doctor or health care professional if they continue or are bothersome):  bone pain  muscle pain This list may not describe all possible side effects. Call your doctor for medical advice about side effects. You may report side effects to FDA at 1-800-FDA-1088. Where should I keep my medicine? Keep out of the reach of children. If you are using this medicine at home, you will be instructed on how to store it. Throw away any unused medicine after the expiration date on the label. NOTE: This sheet is a summary. It may not cover all possible information. If you have questions about this medicine, talk to your doctor, pharmacist, or health care provider.  2020 Elsevier/Gold Standard (2017-12-27 16:57:08)   Central Line, Adult A central line is a thin, flexible tube (catheter) that is put in your vein. It can be used to:  Give you medicine.  Give you food and nutrients. Follow these instructions at home: Caring for the tube   Follow instructions from your doctor about: ? Flushing the tube with saline solution. ? Cleaning the tube and the area around it.  Only flush with clean (sterile) supplies. The supplies should be from your doctor, a pharmacy, or another place that your doctor recommends.  Before you flush the tube or clean the area around the tube: ? Wash your hands with soap and water. If you cannot use soap and water, use hand sanitizer. ? Clean the central line hub with rubbing alcohol. Caring for your skin  Keep the area where the tube was put in clean and dry.  Every day, and when changing the bandage, check the skin around the central line for: ? Redness, swelling, or pain. ? Fluid or blood. ? Warmth. ? Pus. ? A bad smell. General instructions  Keep the tube clamped, unless it is being used.  Keep  your supplies in a clean, dry location.  If you or someone else accidentally pulls on the tube, make sure: ? The bandage (dressing) is okay. ? There is no bleeding. ? The tube has not been pulled out.  Do not use scissors or sharp objects near the tube.  Do not swim or let the tube soak in a tub.  Ask your doctor what activities are safe for you. Your doctor may tell you not to lift anything or move your arm too much.  Take over-the-counter and prescription medicines only as told by your doctor.  Change bandages as told by your doctor.  Keep your bandage dry. If a bandage gets wet, have it changed right away.  Keep all follow-up visits as told by your doctor. This is important. Throwing away supplies  Throw away any syringes in a trash (disposal) container that is only for sharp items (sharps container). You can buy a sharps container from a pharmacy, or you can make one by using an empty hard plastic bottle with a cover.  Place any used bandages or infusion bags into a plastic bag. Throw that bag in the trash. Contact a doctor if:  You have any   of these where the tube was put in: ? Redness, swelling, or pain. ? Fluid or blood. ? A warm feeling. ? Pus or a bad smell. Get help right away if:  You have: ? A fever. ? Chills. ? Trouble getting enough air (shortness of breath). ? Trouble breathing. ? Pain in your chest. ? Swelling in your neck, face, chest, or arm.  You are coughing.  You feel your heart beating fast or skipping beats.  You feel dizzy or you pass out (faint).  There are red lines coming from where the tube was put in.  The area where the tube was put in is bleeding and the bleeding will not stop.  Your tube is hard to flush.  You do not get a blood return from the tube.  The tube gets loose or comes out.  The tube has a hole or a tear.  The tube leaks. Summary  A central line is a thin, flexible tube (catheter) that is put in your vein. It can be used to take blood for lab tests or  to give you medicine.  Follow instructions from your doctor about flushing and cleaning the tube.  Keep the area where the tube was put in clean and dry.  Ask your doctor what activities are safe for you. This information is not intended to replace advice given to you by your health care provider. Make sure you discuss any questions you have with your health care provider. Document Released: 09/07/2012 Document Revised: 01/11/2019 Document Reviewed: 10/08/2016 Elsevier Patient Education  2020 Elsevier Inc.  

## 2019-07-10 ENCOUNTER — Other Ambulatory Visit: Payer: BC Managed Care – PPO

## 2019-07-10 ENCOUNTER — Ambulatory Visit: Payer: BC Managed Care – PPO | Admitting: Nurse Practitioner

## 2019-07-12 ENCOUNTER — Inpatient Hospital Stay: Payer: BC Managed Care – PPO | Admitting: Medical

## 2019-07-12 ENCOUNTER — Telehealth: Payer: Self-pay | Admitting: *Deleted

## 2019-07-12 ENCOUNTER — Telehealth: Payer: Self-pay

## 2019-07-12 ENCOUNTER — Other Ambulatory Visit: Payer: Self-pay

## 2019-07-12 ENCOUNTER — Inpatient Hospital Stay (HOSPITAL_BASED_OUTPATIENT_CLINIC_OR_DEPARTMENT_OTHER): Payer: BC Managed Care – PPO | Admitting: Medical

## 2019-07-12 VITALS — BP 128/91 | HR 72 | Temp 97.8°F | Resp 16 | Ht 73.0 in | Wt 228.4 lb

## 2019-07-12 DIAGNOSIS — C787 Secondary malignant neoplasm of liver and intrahepatic bile duct: Secondary | ICD-10-CM

## 2019-07-12 DIAGNOSIS — Z5111 Encounter for antineoplastic chemotherapy: Secondary | ICD-10-CM | POA: Diagnosis not present

## 2019-07-12 DIAGNOSIS — C20 Malignant neoplasm of rectum: Secondary | ICD-10-CM

## 2019-07-12 DIAGNOSIS — D649 Anemia, unspecified: Secondary | ICD-10-CM

## 2019-07-12 LAB — CMP (CANCER CENTER ONLY)
ALT: 22 U/L (ref 0–44)
AST: 17 U/L (ref 15–41)
Albumin: 3 g/dL — ABNORMAL LOW (ref 3.5–5.0)
Alkaline Phosphatase: 307 U/L — ABNORMAL HIGH (ref 38–126)
Anion gap: 9 (ref 5–15)
BUN: 12 mg/dL (ref 8–23)
CO2: 21 mmol/L — ABNORMAL LOW (ref 22–32)
Calcium: 8.5 mg/dL — ABNORMAL LOW (ref 8.9–10.3)
Chloride: 110 mmol/L (ref 98–111)
Creatinine: 0.84 mg/dL (ref 0.61–1.24)
GFR, Est AFR Am: >60 mL/min (ref >60)
GFR, Estimated: >60 mL/min (ref >60)
Glucose, Bld: 97 mg/dL (ref 70–99)
Potassium: 3.9 mmol/L (ref 3.5–5.1)
Sodium: 140 mmol/L (ref 135–145)
Total Bilirubin: <0.2 mg/dL — ABNORMAL LOW (ref 0.3–1.2)
Total Protein: 6.7 g/dL (ref 6.5–8.1)

## 2019-07-12 LAB — CBC WITH DIFFERENTIAL (CANCER CENTER ONLY)
Abs Immature Granulocytes: 0.11 10*3/uL — ABNORMAL HIGH (ref 0.00–0.07)
Basophils Absolute: 0.1 10*3/uL (ref 0.0–0.1)
Basophils Relative: 1 %
Eosinophils Absolute: 0.1 10*3/uL (ref 0.0–0.5)
Eosinophils Relative: 1 %
HCT: 23.9 % — ABNORMAL LOW (ref 39.0–52.0)
Hemoglobin: 7.4 g/dL — ABNORMAL LOW (ref 13.0–17.0)
Immature Granulocytes: 1 %
Lymphocytes Relative: 13 %
Lymphs Abs: 1.2 10*3/uL (ref 0.7–4.0)
MCH: 30.8 pg (ref 26.0–34.0)
MCHC: 31 g/dL (ref 30.0–36.0)
MCV: 99.6 fL (ref 80.0–100.0)
Monocytes Absolute: 0.6 10*3/uL (ref 0.1–1.0)
Monocytes Relative: 7 %
Neutro Abs: 6.7 10*3/uL (ref 1.7–7.7)
Neutrophils Relative %: 77 %
Platelet Count: 191 10*3/uL (ref 150–400)
RBC: 2.4 MIL/uL — ABNORMAL LOW (ref 4.22–5.81)
RDW: 19.7 % — ABNORMAL HIGH (ref 11.5–15.5)
WBC Count: 8.7 10*3/uL (ref 4.0–10.5)
nRBC: 0.2 % (ref 0.0–0.2)

## 2019-07-12 LAB — PREPARE RBC (CROSSMATCH)

## 2019-07-12 MED ORDER — HEPARIN SOD (PORK) LOCK FLUSH 100 UNIT/ML IV SOLN
500.0000 [IU] | Freq: Every day | INTRAVENOUS | Status: AC | PRN
  Administered 2019-07-12: 13:00:00 500 [IU]
  Filled 2019-07-12: qty 5

## 2019-07-12 MED ORDER — DIPHENHYDRAMINE HCL 25 MG PO CAPS
ORAL_CAPSULE | ORAL | Status: AC
  Filled 2019-07-12: qty 1

## 2019-07-12 MED ORDER — DIPHENHYDRAMINE HCL 25 MG PO CAPS
25.0000 mg | ORAL_CAPSULE | Freq: Once | ORAL | Status: AC
  Administered 2019-07-12: 25 mg via ORAL

## 2019-07-12 MED ORDER — SODIUM CHLORIDE 0.9% IV SOLUTION
250.0000 mL | Freq: Once | INTRAVENOUS | Status: AC
  Administered 2019-07-12: 250 mL via INTRAVENOUS
  Filled 2019-07-12: qty 250

## 2019-07-12 MED ORDER — ACETAMINOPHEN 325 MG PO TABS
ORAL_TABLET | ORAL | Status: AC
  Filled 2019-07-12: qty 2

## 2019-07-12 MED ORDER — SODIUM CHLORIDE 0.9% FLUSH
10.0000 mL | INTRAVENOUS | Status: AC | PRN
  Administered 2019-07-12: 10 mL
  Filled 2019-07-12: qty 10

## 2019-07-12 MED ORDER — ACETAMINOPHEN 325 MG PO TABS
650.0000 mg | ORAL_TABLET | Freq: Once | ORAL | Status: AC
  Administered 2019-07-12: 11:00:00 650 mg via ORAL

## 2019-07-12 NOTE — Telephone Encounter (Signed)
Spoke with patient and informed him Alfredia Client would be able to see patient and administer 1 unit PRBC if necessary in the [facility]. Patient stated he would head to the [facility] immediately.   High priority scheduling message sent for [place] visit. Orders placed for CBC, CMP, blood bank hold tube, as well as type and screen orders

## 2019-07-12 NOTE — Progress Notes (Signed)
Received 1unit PRBCS today, tolerated well.  VSS.  Reports feeling better at end of tx.  Denies questions /concerns at time of d/c, reports understanding of d/c and f/u instructions.  Pt unable to give urine sample today for urine protein test.  PA Lucianne Lei aware, ok to discontinue order at this time.

## 2019-07-12 NOTE — Patient Instructions (Signed)
Blood Transfusion, Adult, Care After This sheet gives you information about how to care for yourself after your procedure. Your doctor may also give you more specific instructions. If you have problems or questions, contact your doctor. Follow these instructions at home:   Take over-the-counter and prescription medicines only as told by your doctor.  Go back to your normal activities as told by your doctor.  Follow instructions from your doctor about how to take care of the area where an IV tube was put into your vein (insertion site). Make sure you: ? Wash your hands with soap and water before you change your bandage (dressing). If there is no soap and water, use hand sanitizer. ? Change your bandage as told by your doctor.  Check your IV insertion site every day for signs of infection. Check for: ? More redness, swelling, or pain. ? More fluid or blood. ? Warmth. ? Pus or a bad smell. Contact a doctor if:  You have more redness, swelling, or pain around the IV insertion site.  You have more fluid or blood coming from the IV insertion site.  Your IV insertion site feels warm to the touch.  You have pus or a bad smell coming from the IV insertion site.  Your pee (urine) turns pink, red, or brown.  You feel weak after doing your normal activities. Get help right away if:  You have signs of a serious allergic or body defense (immune) system reaction, including: ? Itchiness. ? Hives. ? Trouble breathing. ? Anxiety. ? Pain in your chest or lower back. ? Fever, flushing, and chills. ? Fast pulse. ? Rash. ? Watery poop (diarrhea). ? Throwing up (vomiting). ? Dark pee. ? Serious headache. ? Dizziness. ? Stiff neck. ? Yellow color in your face or the white parts of your eyes (jaundice). Summary  After a blood transfusion, return to your normal activities as told by your doctor.  Every day, check for signs of infection where the IV tube was put into your vein.  Some  signs of infection are warm skin, more redness and pain, more fluid or blood, and pus or a bad smell where the needle went in.  Contact your doctor if you feel weak or have any unusual symptoms. This information is not intended to replace advice given to you by your health care provider. Make sure you discuss any questions you have with your health care provider. Document Released: 10/12/2014 Document Revised: 01/26/2018 Document Reviewed: 05/15/2016 Elsevier Patient Education  2020 Elsevier Inc.  

## 2019-07-12 NOTE — Telephone Encounter (Signed)
"  Billy Mendoza wife [name] calling for him.   He is very weak and needs to be seen this morning to have a unit of blood given.  He gets very tired and can only stand for a little bit   Please call his number [phone]."

## 2019-07-12 NOTE — Progress Notes (Signed)
Sympt[facility] Management Clinic Progress Note   Billy Mendoza [id number] 1951-03-09 68 y.o.  Billy Mendoza is managed by [name]  Actively treated with chemotherapy/immunotherapy/hormonal therapy: yes  Current therapy: FOLFIRI  Last treated: 07/06/2019 (cycle #3)  Next scheduled appointment with provider: 08/03/2019  Assessment: Plan:    Rectal cancer metastasized to liver [facility])  Symptomatic anemia   Metastatic rectal cancer: Patient continues to be followed by [name] and is status post cycle 3 of FOLFIRI which was dosed on 07/06/2019.  He is scheduled to be seen in follow-up on 08/03/2019.  Symptomatic anemia: A CBC returned today with a hemoglobin of 7.4.  The patient was transfused with 1 unit of packed red blood cells.  He has an appointment to return for labs and consideration of a another transfusion on 07/17/2019.  Please see After Visit Summary for patient specific instructions.  Future Appointments  Date Time Provider [place]  07/12/2019 10:45 AM Sandi Mealy E., PA-C [facility] None  07/17/2019 10:15 AM [facility] LAB 5 [facility] None  07/17/2019 10:30 AM [facility] [place] FLUSH [facility] None  07/17/2019 11:15 AM [facility] INFUSION [facility] None  08/03/2019 10:15 AM [facility] LAB 5 [facility] None  08/03/2019 10:30 AM [facility] [place] FLUSH [facility] None  08/03/2019 11:00 AM Truitt Merle, MD [facility] None  08/03/2019 12:00 PM [facility] INFUSION [facility] None  08/05/2019  2:00 PM [facility] [place] FLUSH [facility] None    No orders of the defined types were placed in this encounter.      Subjective:   Patient ID:  Billy Mendoza is a 68 y.o. (DOB 1951/06/04) male.  Chief Complaint:  Chief Complaint  Patient presents with   Fatigue    HPI Billy Mendoza Is a 68 y.o. male with a diagnosis of a metastatic rectal cancer.  He is managed by [name] and is status post cycle 3 of FOLFIRI which was dosed on 07/06/2019.  He presents  to clinic today with a report of fatigue and weakness which is been increasing in severity over the past week.  He denies nausea, vomiting, diarrhea, fevers, chills, chest pain, shortness of breath, melena, or bright red blood per rectum.  His appetite is good.  Medications: I have reviewed the patient's current medications.  Allergies: No Known Allergies  Past Medical History:  Diagnosis Date   colorectal ca dx'd 07/2018   Liver metastases ([place]) dx'd 07/2018   Lung metastases ([place]) dx'd 07/2018    Past Surgical History:  Procedure Laterality Date   COLONIC STENT PLACEMENT N/A 01/19/2019   Procedure: COLONIC STENT PLACEMENT;  Surgeon: Carol Ada, MD;  Location: [facility];  Service: Endoscopy;  Laterality: N/A;   FLEXIBLE SIGMOIDOSCOPY N/A 01/19/2019   Procedure: FLEXIBLE SIGMOIDOSCOPY;  Surgeon: Carol Ada, MD;  Location: [facility];  Service: Endoscopy;  Laterality: N/A;   IR IMAGING GUIDED PORT INSERTION  08/16/2018   IR US GUIDE BX ASP/DRAIN  08/16/2018    Family History  Problem Relation Age of Onset   Cancer Mother 39       breast cancer   Atrial fibrillation Father    Heart failure Father    Cancer Maternal Grandmother        lung cancer     Social History   Socioeconomic History   Marital status: Married    Spouse name: Not on file   Number of children: Not on file   Years of education: Not on file   Highest education level: Not on file  Occupational  History   Not on file  Social Needs   Financial resource strain: Not on file   Food insecurity    Worry: Not on file    Inability: Not on file   Transportation needs    Medical: No    Non-medical: No  Tobacco Use   Smoking status: Former Smoker    Years: 10.00    Types: Cigarettes, Cigars   Smokeless tobacco: Never Used   Tobacco comment: quit in Sep 2019   Substance and Sexual Activity   Alcohol use: Yes    Alcohol/week: 1.0 - 2.0 standard drinks    Types: 1 - 2 Shots of  liquor per week   Drug use: Never   Sexual activity: Not on file  Lifestyle   Physical activity    Days per week: Not on file    Minutes per session: Not on file   Stress: Not on file  Relationships   Social connections    Talks on phone: Not on file    Gets together: Not on file    Attends religious service: Not on file    Active member of club or organization: Not on file    Attends meetings of clubs or organizations: Not on file    Relationship status: Not on file   Intimate partner violence    Fear of current or ex partner: Not on file    Emotionally abused: Not on file    Physically abused: Not on file    Forced sexual activity: Not on file  Other Topics Concern   Not on file  Social History Narrative   Not on file    Past Medical History, Surgical history, Social history, and Family history were reviewed and updated as appropriate.   Please see review of systems for further details on the patient's review from today.   Review of Systems:  Review of Systems  Constitutional: Positive for fatigue. Negative for appetite change, chills, diaphoresis and fever.  HENT: Negative for dental problem, mouth sores and trouble swallowing.   Respiratory: Negative for cough, chest tightness and shortness of breath.   Cardiovascular: Negative for chest pain and palpitations.  Gastrointestinal: Negative for constipation, diarrhea, nausea and vomiting.  Neurological: Positive for weakness. Negative for dizziness, syncope and headaches.    Objective:   Physical Exam:  BP (!) 126/92 (BP Location: Left Arm, Patient Position: Sitting)    Pulse 95    Temp 98.9 F (37.2 C) (Temporal)    Resp 18    Ht 6\' 1"  (1.854 m)    Wt 228 lb 6.4 oz (103.6 kg)    SpO2 100%    BMI 30.13 kg/m  ECOG: 1  Physical Exam Constitutional:      General: He is not in acute distress.    Appearance: He is not diaphoretic.  HENT:     Head: Normocephalic and atraumatic.  Eyes:     General: No scleral  icterus.       Right eye: No discharge.        Left eye: No discharge.  Cardiovascular:     Rate and Rhythm: Normal rate and regular rhythm.     Heart sounds: Normal heart sounds. No murmur. No friction rub. No gallop.   Pulmonary:     Effort: Pulmonary effort is normal. No respiratory distress.     Breath sounds: Normal breath sounds. No wheezing or rales.  Skin:    General: Skin is warm and dry.  Findings: No erythema or rash.  Neurological:     Mental Status: He is alert.     Gait: Gait normal.  Psychiatric:        Mood and Affect: Mood normal.        Behavior: Behavior normal.        Thought Content: Thought content normal.        Judgment: Judgment normal.     Lab Review:     Component Value Date/Time   NA 138 07/06/2019 1125   K 4.1 07/06/2019 1125   CL 108 07/06/2019 1125   CO2 22 07/06/2019 1125   GLUCOSE 94 07/06/2019 1125   BUN 12 07/06/2019 1125   CREATININE 0.78 07/06/2019 1125   CALCIUM 8.7 (L) 07/06/2019 1125   PROT 6.7 07/06/2019 1125   ALBUMIN 3.0 (L) 07/06/2019 1125   AST 28 07/06/2019 1125   ALT 29 07/06/2019 1125   ALKPHOS 337 (H) 07/06/2019 1125   BILITOT 0.3 07/06/2019 1125   GFRNONAA >60 07/06/2019 1125   GFRAA >60 07/06/2019 1125       Component Value Date/Time   WBC 8.7 07/12/2019 1020   WBC 5.6 06/02/2019 0337   RBC 2.40 (L) 07/12/2019 1020   HGB 7.4 (L) 07/12/2019 1020   HCT 23.9 (L) 07/12/2019 1020   PLT 191 07/12/2019 1020   MCV 99.6 07/12/2019 1020   MCH 30.8 07/12/2019 1020   MCHC 31.0 07/12/2019 1020   RDW 19.7 (H) 07/12/2019 1020   LYMPHSABS PENDING 07/12/2019 1020   MONOABS PENDING 07/12/2019 1020   EOSABS PENDING 07/12/2019 1020   BASOSABS PENDING 07/12/2019 1020   -------------------------------  Imaging from last 24 hours (if applicable):  Radiology interpretation: No results found.

## 2019-07-12 NOTE — Progress Notes (Signed)
These preliminary result these preliminary results were noted.  Awaiting final report.

## 2019-07-12 NOTE — Telephone Encounter (Signed)
Connected with patient.  "I was just called and on my way in now."

## 2019-07-13 LAB — TYPE AND SCREEN
ABO/RH(D): O POS
Antibody Screen: NEGATIVE
Unit division: 0

## 2019-07-13 LAB — BPAM RBC
Blood Product Expiration Date: 202011072359
ISSUE DATE / TIME: 202010071143
Unit Type and Rh: 5100

## 2019-07-17 ENCOUNTER — Inpatient Hospital Stay: Payer: BC Managed Care – PPO

## 2019-07-17 ENCOUNTER — Other Ambulatory Visit: Payer: Self-pay

## 2019-07-17 DIAGNOSIS — C20 Malignant neoplasm of rectum: Secondary | ICD-10-CM

## 2019-07-17 DIAGNOSIS — Z5111 Encounter for antineoplastic chemotherapy: Secondary | ICD-10-CM | POA: Diagnosis not present

## 2019-07-17 DIAGNOSIS — Z95828 Presence of other vascular implants and grafts: Secondary | ICD-10-CM

## 2019-07-17 LAB — CBC WITH DIFFERENTIAL (CANCER CENTER ONLY)
Abs Immature Granulocytes: 0.77 10*3/uL — ABNORMAL HIGH (ref 0.00–0.07)
Basophils Absolute: 0.1 10*3/uL (ref 0.0–0.1)
Basophils Relative: 1 %
Eosinophils Absolute: 0.1 10*3/uL (ref 0.0–0.5)
Eosinophils Relative: 1 %
HCT: 27.1 % — ABNORMAL LOW (ref 39.0–52.0)
Hemoglobin: 8.4 g/dL — ABNORMAL LOW (ref 13.0–17.0)
Immature Granulocytes: 9 %
Lymphocytes Relative: 13 %
Lymphs Abs: 1.1 10*3/uL (ref 0.7–4.0)
MCH: 30.4 pg (ref 26.0–34.0)
MCHC: 31 g/dL (ref 30.0–36.0)
MCV: 98.2 fL (ref 80.0–100.0)
Monocytes Absolute: 1 10*3/uL (ref 0.1–1.0)
Monocytes Relative: 12 %
Neutro Abs: 5.4 10*3/uL (ref 1.7–7.7)
Neutrophils Relative %: 64 %
Platelet Count: 164 10*3/uL (ref 150–400)
RBC: 2.76 MIL/uL — ABNORMAL LOW (ref 4.22–5.81)
RDW: 21.2 % — ABNORMAL HIGH (ref 11.5–15.5)
WBC Count: 8.4 10*3/uL (ref 4.0–10.5)
nRBC: 0.7 % — ABNORMAL HIGH (ref 0.0–0.2)

## 2019-07-17 LAB — CMP (CANCER CENTER ONLY)
ALT: 24 U/L (ref 0–44)
AST: 22 U/L (ref 15–41)
Albumin: 3 g/dL — ABNORMAL LOW (ref 3.5–5.0)
Alkaline Phosphatase: 308 U/L — ABNORMAL HIGH (ref 38–126)
Anion gap: 9 (ref 5–15)
BUN: 11 mg/dL (ref 8–23)
CO2: 22 mmol/L (ref 22–32)
Calcium: 8.9 mg/dL (ref 8.9–10.3)
Chloride: 107 mmol/L (ref 98–111)
Creatinine: 0.85 mg/dL (ref 0.61–1.24)
GFR, Est AFR Am: >60 mL/min (ref >60)
GFR, Estimated: >60 mL/min (ref >60)
Glucose, Bld: 100 mg/dL — ABNORMAL HIGH (ref 70–99)
Potassium: 4.2 mmol/L (ref 3.5–5.1)
Sodium: 138 mmol/L (ref 135–145)
Total Bilirubin: 0.2 mg/dL — ABNORMAL LOW (ref 0.3–1.2)
Total Protein: 6.7 g/dL (ref 6.5–8.1)

## 2019-07-17 LAB — CK: Total CK: 17 U/L — ABNORMAL LOW (ref 49–397)

## 2019-07-17 LAB — MAGNESIUM: Magnesium: 1.8 mg/dL (ref 1.7–2.4)

## 2019-07-17 MED ORDER — SODIUM CHLORIDE 0.9% FLUSH
10.0000 mL | INTRAVENOUS | Status: DC | PRN
  Administered 2019-07-17: 10 mL
  Filled 2019-07-17: qty 10

## 2019-07-17 MED ORDER — HEPARIN SOD (PORK) LOCK FLUSH 100 UNIT/ML IV SOLN
500.0000 [IU] | Freq: Once | INTRAVENOUS | Status: AC | PRN
  Administered 2019-07-17: 12:00:00 500 [IU]
  Filled 2019-07-17: qty 5

## 2019-07-17 NOTE — Progress Notes (Signed)
Patient's hemoglobin today is 8.4, asymptomatic feeling good according to [facility] RN in infusion, per Dr. Burr Medico no blood transfusion today.

## 2019-07-23 ENCOUNTER — Other Ambulatory Visit: Payer: Self-pay | Admitting: Nurse Practitioner

## 2019-07-24 NOTE — Progress Notes (Signed)
[place]   [phone] [phone]   Clinic Follow up Note   Patient Care Team: Merry Proud, MD as PCP - General (Internal Medicine) Truitt Merle, MD as Consulting Physician (Hematology) Carol Ada, MD as Consulting Physician (Gastroenterology)  Date of Service:  08/03/2019  CHIEF COMPLAINT: F/u of rectal cancer metastasized to liver  SUMMARY OF ONCOLOGIC HISTORY: Oncology History Overview Note  Cancer Staging Rectal cancer metastasized to liver [facility]) Staging form: Colon and Rectum, AJCC 8th Edition - Clinical stage from 07/28/2018: Stage IVB (cTX, cNX, pM1b) - Signed by Truitt Merle, MD on 08/18/2018     Rectal cancer metastasized to liver ([place])  07/28/2018 Initial Biopsy   Biopsy 07/28/18 Final Microscopic Diagnosis A.Colon, Ascending, Polyp:  -Fragments of tubular adenoma -No high-grade dysplasia of malignancy.  B. Rectum, Mass, Polyp:  -Invasive adenocarcinoma, moderately - poorly differentiatred.    07/28/2018 Cancer Staging   Staging form: Colon and Rectum, AJCC 8th Edition - Clinical stage from 07/28/2018: Stage IVB (cTX, cNX, pM1b) - Signed by Truitt Merle, MD on 08/18/2018   08/04/2018 Initial Diagnosis   Rectal cancer metastasized to liver ([place])   08/16/2018 Pathology Results   Biospy  Diagnosis 08/16/18 Liver, needle/core biopsy - ADENOCARCINOMA. Microscopic Comment By morphology, the findings are compatible with the provided clinical history of colorectal adenocarcinoma. Results reported to [name] on 08/18/2018. Intradepartmental consultation was obtained ([name]).    08/16/2018 Miscellaneous   Foundation 1 genomic testing: MS-stable Tumor mutation burden  3/Mb K-ras/NRAS wild-type BRAF V600 E KDMSA R266Q PTEN T32f* SMAD4 loss TP53 loss     08/18/2018 Procedure   Colonoscopy by Dr. HBenson Norwayshowed malignant appearing partially obstructing tumor in the rectum, 8 cm above anal verge.   08/18/2018  - 09/07/2018 Chemotherapy   CAPOX q3weeks with Xeloda 20065mBID 2 weeks on, 1 weeks off starting 08/18/18.  Swithced treatment due to BRAF mutation.    09/07/2018 - 12/24/2018 Chemotherapy   FOLFOXIRI and avastin every 2 weeks starting 09/07/18. Oxaliplatin held starting with cycle 8 and continue with FOLFIRI and Avastin. D/c due to disease progression.    11/04/2018 Imaging   CT CAP 11/04/18 IMPRESSION: 1. Peripheral filling defect in the right main pulmonary artery extending primarily into the right lower lobe pulmonary artery. While the peripheral location indicates some degree of chronicity of this pulmonary embolus, this is a new finding compared to 07/28/2018. The right ventricular to left ventricular ratio is 0.7, which does not specifically indicate right heart strain. 2. The malignancy appears considerably improved, with reduced size pulmonary nodules; reduced size and increased internal necrosis of the hepatic metastatic lesions; marked improvement in the thoracic adenopathy; reduced conspicuity of the rectal tumor; and significant reduction in perirectal tumor burden/adenopathy. 3. Other imaging findings of potential clinical significance: Aortic Atherosclerosis (ICD10-I70.0). Coronary atherosclerosis. Bilateral renal cysts. Small umbilical hernia contains adipose tissue.  Critical Value/emergent results were called by telephone at the time of interpretation on 11/04/2018 at 3:05 pm to Dr. YATruitt Merle who verbally acknowledged these results.   01/18/2019 Imaging   CT AP W contrast  IMPRESSION: 1. Colonic obstruction resulting from rectal carcinoma. 2. Hepatic metastatic disease which is decreased in size compared with the prior examination of 11/04/2018. 3. Multiple right middle lobe and right lower lobe pulmonary nodules most consistent with metastatic disease.   02/01/2019 - 06/01/2019 Chemotherapy   second line BRAFTOVI(encorafenib) 30067maily and MEKTOVI(binimetinib),  69m78m2h and Vectibix q2weeks starting 02/01/19.  -He has held  Mektovi 02/19/19-04/06/19 due to RT  -Stopped on 06/01/19 due to disease progression    02/13/2019 Imaging   CT Chest  IMPRESSION: Small bilateral pulmonary metastases, mildly progressive from January 2020.  Multifocal hepatic metastases, incompletely visualized, favored to be progressive from April 2020.  Aortic Atherosclerosis (ICD10-I70.0).    02/22/2019 - 03/21/2019 Radiation Therapy   -Palliative local radiation  -Holding mektovi (binimetinib) during RT -Due to significant rectal pain, diarrhea and stool incontinence, he stopped RT after 03/13/19. He was only able to finish 1/2 remianing treatments on 03/21/19.    05/31/2019 Imaging   CT CAP W contrast 05/31/19  IMPRESSION: 1. Pulmonary emboli with a saddle embolus. Critical Value/emergent results were called by telephone at the time of interpretation on 05/31/2019 at 12:03 pm to Cira Rue, NP , who verbally acknowledged these results. 2. Slight increase in perirectal soft tissue nodularity with overall worsening in pulmonary and hepatic metastatic disease. Omental nodularity appears slightly improved. 3. New mediastinal and periportal adenopathy. Aortocaval lymph node is not enlarged by CT size criteria but is new and therefore considered suspicious for metastatic involvement. 4. Aortic atherosclerosis (ICD10-170.0). Coronary artery calcification.   06/08/2019 -  Chemotherapy   FOLFOXIRI q2weeks starting 06/08/19. Due to fatigue, diarrhea, nausea and low blood counts he was switched to FOLFIRI starting 06/22/19. Postponed the addition of Avastin until C4 on 08/03/19 given recent PE.      CURRENT THERAPY:  FOLFOXIRI q2weeks starting 06/08/19. Due to fatigue, diarrhea, nausea and low blood counts he was switched to FOLFIRI starting 06/22/19. Postponed the addition of Avastin until C4 on 08/03/19 given recent PE.  INTERVAL HISTORY:  Billy Mendoza is here for a follow up  and treatment. He presents to the clinic alone. He notes he has been doing on his month off chemo and with his family. He notes he felt better after blood trasnfusion ealier this month. He notes he has felt energy decrease in the last few days. He notes his appettie is fair. He has been mildly constipated which he attriputes so his effecting his stent. He tried Miralax last week which helped some. He notes he plans to take Magnesium Citrate tomorrow. He notes intermittent mild GI Spotting. He requests to go back to Oral Xarelto. He notes his neuropathy that is mainly in fingers is stable, so far manegeable. He notes he uses MS Contin BID as needed and uses Tylenol or Oxycodone for breakthrough pain. This is for his rectal pain when the stent is.     REVIEW OF SYSTEMS:   Constitutional: Denies fevers, chills or abnormal weight loss Eyes: Denies blurriness of vision Ears, nose, mouth, throat, and face: Denies mucositis or sore throat Respiratory: Denies cough, dyspnea or wheezes Cardiovascular: Denies palpitation, chest discomfort or lower extremity swelling Gastrointestinal:  Denies nausea, heartburn (+) Mild Constipation (+) Intermittent mild GI spotting (+) Rectal pain Skin: Denies abnormal skin rashes Lymphatics: Denies new lymphadenopathy or easy bruising Neurological: (+) Stable, manageable neuropathy, mainly in fingers Behavioral/Psych: Mood is stable, no new changes  All other systems were reviewed with the patient and are negative.  MEDICAL HISTORY:  Past Medical History:  Diagnosis Date   colorectal ca dx'd 07/2018   Liver metastases ([place]) dx'd 07/2018   Lung metastases ([place]) dx'd 07/2018    SURGICAL HISTORY: Past Surgical History:  Procedure Laterality Date   COLONIC STENT PLACEMENT N/A 01/19/2019   Procedure: COLONIC STENT PLACEMENT;  Surgeon: Carol Ada, MD;  Location: [facility];  Service: Endoscopy;  Laterality: N/A;  FLEXIBLE SIGMOIDOSCOPY N/A 01/19/2019    Procedure: FLEXIBLE SIGMOIDOSCOPY;  Surgeon: Carol Ada, MD;  Location: [facility];  Service: Endoscopy;  Laterality: N/A;   IR IMAGING GUIDED PORT INSERTION  08/16/2018   IR US GUIDE BX ASP/DRAIN  08/16/2018    I have reviewed the social history and family history with the patient and they are unchanged from previous note.  ALLERGIES:  has No Known Allergies.  MEDICATIONS:  Current Outpatient Medications  Medication Sig Dispense Refill   acetaminophen (TYLENOL) 325 MG tablet Take 650 mg by mouth every 6 (six) hours as needed for moderate pain.     diphenoxylate-atropine (LOMOTIL) 2.5-0.025 MG tablet Take 1-2 tablets by mouth 4 (four) times daily as needed for diarrhea or loose stools. Up to 8 tabs daily for severe diarrhea 60 tablet 2   magnesium oxide (MAGNESIUM-OXIDE) 400 (241.3 Mg) MG tablet Take 0.5 tablets (200 mg total) by mouth 2 (two) times daily. 30 tablet 1   morphine (MS CONTIN) 15 MG 12 hr tablet Take 1 tablet (15 mg total) by mouth every 12 (twelve) hours as needed for pain. 60 tablet 0   oxyCODONE (OXY IR/ROXICODONE) 5 MG immediate release tablet Take 1 tablet (5 mg total) by mouth every 6 (six) hours as needed for severe pain. 60 tablet 0   VITAMIN E PO Take 1 tablet by mouth daily.     enoxaparin (LOVENOX) 100 MG/ML injection Inject 1 mL (100 mg total) into the skin every 12 (twelve) hours. 62 mL 0   rivaroxaban (XARELTO) 20 MG TABS tablet Take 1 tablet (20 mg total) by mouth daily with supper. 30 tablet 2   traMADol (ULTRAM) 50 MG tablet Take 1 tablet (50 mg total) by mouth every 6 (six) hours as needed. (Patient not taking: Reported on 08/03/2019) 30 tablet 0   No current facility-administered medications for this visit.    Facility-Administered Medications Ordered in Other Visits  Medication Dose Route Frequency Provider Last Rate Last Dose   atropine injection 0.5 mg  0.5 mg Intravenous Once PRN Truitt Merle, MD       fluorouracil (ADRUCIL) 5,500 mg in  sodium chloride 0.9 % 140 mL chemo infusion  2,400 mg/m2 (Treatment Plan Recorded) Intravenous 1 day or 1 dose Truitt Merle, MD       irinotecan (CAMPTOSAR) 380 mg in sodium chloride 0.9 % 500 mL chemo infusion  165 mg/m2 (Treatment Plan Recorded) Intravenous Once Truitt Merle, MD       leucovorin 920 mg in dextrose 5 % 250 mL infusion  400 mg/m2 (Treatment Plan Recorded) Intravenous Once Truitt Merle, MD        PHYSICAL EXAMINATION: ECOG PERFORMANCE STATUS: 2 - Symptomatic, <50% confined to bed  Vitals:   08/03/19 1100  BP: 130/87  Pulse: 100  Resp: 18  Temp: 98.2 F (36.8 C)  SpO2: 99%   Filed Weights   08/03/19 1100  Weight: 228 lb 8 oz (103.6 kg)    GENERAL:alert, no distress and comfortable SKIN: skin color, texture, turgor are normal, no rashes or significant lesions EYES: normal, Conjunctiva are pink and non-injected, sclera clear  NECK: supple, thyroid normal size, non-tender, without nodularity LYMPH:  no palpable lymphadenopathy in the cervical, axillary  LUNGS: clear to auscultation and percussion with normal breathing effort HEART: regular rate & rhythm and no murmurs and no lower extremity edema ABDOMEN:abdomen soft, non-tender and normal bowel sounds Musculoskeletal:no cyanosis of digits and no clubbing  NEURO: alert & oriented x 3 with fluent  speech, no focal motor/sensory deficits  LABORATORY DATA:  I have reviewed the data as listed CBC Latest Ref Rng & Units 08/03/2019 07/17/2019 07/12/2019  WBC 4.0 - 10.5 K/uL 10.1 8.4 8.7  Hemoglobin 13.0 - 17.0 g/dL 8.3(L) 8.4(L) 7.4(L)  Hematocrit 39.0 - 52.0 % 26.5(L) 27.1(L) 23.9(L)  Platelets 150 - 400 K/uL 396 164 191     CMP Latest Ref Rng & Units 08/03/2019 07/17/2019 07/12/2019  Glucose 70 - 99 mg/dL 111(H) 100(H) 97  BUN 8 - 23 mg/dL 12 11 12   Creatinine 0.61 - 1.24 mg/dL 0.80 0.85 0.84  Sodium 135 - 145 mmol/L 138 138 140  Potassium 3.5 - 5.1 mmol/L 4.4 4.2 3.9  Chloride 98 - 111 mmol/L 106 107 110  CO2 22 - 32  mmol/L 22 22 21(L)  Calcium 8.9 - 10.3 mg/dL 9.1 8.9 8.5(L)  Total Protein 6.5 - 8.1 g/dL 7.1 6.7 6.7  Total Bilirubin 0.3 - 1.2 mg/dL 0.3 0.2(L) <0.2(L)  Alkaline Phos 38 - 126 U/L 473(H) 308(H) 307(H)  AST 15 - 41 U/L 26 22 17   ALT 0 - 44 U/L 27 24 22       RADIOGRAPHIC STUDIES: I have personally reviewed the radiological images as listed and agreed with the findings in the report. No results found.   ASSESSMENT & PLAN:  Billy Mendoza is a 68 y.o. male with   1. Proximal rectal adenocarcinoma, metastatic to liver,lungsandnodes, MSS,KRAS/NRAS wild type,BRAF V600E mutation (+) -He was diagnosed in10/2019.He completed 1 cycle of CAPOX before changing todose reducedFOLFOXIRI andAvastindue to BRAF mutation in his tumor. -Hismetastatic diseaseis not curablewith surgery, he will continue treatment for as long as he can tolerate to control disease. -He was hospitalized on 01/18/19 forbowel obstruction due to rectal cancer. He had stent placed by Dr. Benson Norway. His CT AP showed his known rectaltumor andobstruction, liver mets have decreased in size and multiple new right lung nodules consistent with metastasis. -Ipreviouslychanged his chemo to second-lineoralBRAF/MEK/EGFR inhibitors(Braftovi, Mektovi andVectibix q2weeks)which he started on 02/01/19. -HereceivedRadiationin May 2020 to his rectal tumor -CTimagesfrom 05/31/19 showsoverall he has had disease progression.Braftovi, Mektoviand vectibix have beenstopped(06/01/19). -Due to previous good response in liver,Irestarted him onIV FOLFOXIRI with lower oxaliplatin dose 50% due to neuropathy on 06/08/19. -Due to fatigue, diarrhea, nausea and low blood counts he was switched to FOLFIRI starting 06/22/19. He tolerated reduced chemo much better, will continue.  -Avastin was held due to his recent PE and rectal bleeding. Has been added with C4 on 08/03/19.  -With recent chemo break this month he is feeling well. Labs  reviewed, CBC and CMP WNL except Hg 8.3, ALP 473. Urine protein negative. CEA and Iron panel still pending. Overall adeqaute to proceed with FOLFIRI today -Her last episode of PE was 2 months ago, I will add Avastin from next cycle chemo -F/u in 2 weeks  2.History ofbowel obstruction from rectal tumor -s/p stent placement by Dr. Benson Norway on 01/19/2019,he declined loop colostomybut agree to keep surgery as last resort.  -He understands thehigh possibility of recurrent bowel obstruction due to tumor progression. -he received a course of radiation -rectal pain mostly mild and manageable, mainly from stent placement.  -He is currently on MS Contin 105m BID as needed (not every dose taken) and alternated between Tylenol and Oxycodone 556mfor breakthrough pain. He is trying to wean himself off Oxycodone.  -Given improvement of pain, this is good indication he is responding to treatment.  -He has been mildly constipated the last few days. I encouraged him  to use Miralax every 1-2 days consistently to maintain soft stool BM daily. If no BM for 3 days he can use Magnesium Citrate. He understands.   3. Rectal bleeding -secondary to cancer -Intermittent, worse with anticoagulation -Continue monitoring  4. Iron deficiency anemia -secondary to blood loss from tumor  -Her prioriron 29, TIBC 369, 8% saturation ratio, consistent with iron deficiency  -He can start prenatal vitamin with iron once daily -He receivedinitial 2 doses ofIV Feraheme on 5/20/20and 03/02/19.  -Last blood transfusion on 07/14/19 -Hg at 8.3 today (08/03/19). Iron panel still pending. He is mildly fatigued.  5.History of right PE, RecentAcute saddle PE and left LE DVTin 05/2019 -His PE is likely related to his underlying metastatic colon cancer and chemotherapy, especially Avastin. -He is on Xarelto 20 mg daily and will continue indefinitely. Has occasionally epistaxis secondary to Avastin,thrombocytopenia and  Xarelto. -The patient was previously on Xarelto for his history of PE, but was not taking it on a routine basis. He would stop it when he developed rectal bleeding and then resume it intermittently.Since he was not taking Xarelto as ordered, he did not fail Xarelto. -He was treated with heparin as inpatient and was discharged with 181m lovenox injections BID  -He has been on Lovenox injections for 2 months now, he prefers oral Xarelto, will switch him back to Xarelto 20 mg daily.  I encouraged him to continue if his rectal bleeding is mild, due to his high risk of thrombosis.  Certainly if he develops severe rectal bleeding, will hold or reduce dose   6. HTN  -TakesAmlodipine -BPusually controlled   7. Goal of care discussion -He is full code now -He understands his disease is incurable,and the goal of therapy is palliative to prolong his life.   Plan -I called in Xarelto 271mdaily to replace Lovenox injection  -labs reviewed and adequate to proceed with FOLFIRI today at same dose with Udenyca on day 3  -will start Avastin next cycle  -Lab, flush, f/u and chemo FOLFIRI and Avastin in 2, 5 (postpone for one week due to holiday), 7 weeks    No problem-specific Assessment & Plan notes found for this encounter.   No orders of the defined types were placed in this encounter.  All questions were answered. The patient knows to call the clinic with any problems, questions or concerns. No barriers to learning was detected. I spent 20 minutes counseling the patient face to face. The total time spent in the appointment was 25 minutes and more than 50% was on counseling and review of test results     YaTruitt MerleMD 08/03/2019   I, AmJoslyn Devonam acting as scribe for YaTruitt MerleMD.   I have reviewed the above documentation for accuracy and completeness, and I agree with the above.

## 2019-07-31 ENCOUNTER — Telehealth: Payer: Self-pay

## 2019-07-31 NOTE — Telephone Encounter (Signed)
Patient's wife Vania Rea calls regarding Lovenox.  She states he only has 3 injections left.  She wants to know if he is to continue with injections or will he be switched to something oral.   [phone]

## 2019-08-01 ENCOUNTER — Other Ambulatory Visit: Payer: Self-pay

## 2019-08-02 ENCOUNTER — Telehealth: Payer: Self-pay

## 2019-08-02 NOTE — Telephone Encounter (Signed)
Left voice message for patient's wife about Lovenox injections.  Per Dr. Burr Medico she can switch him to something oral. Will discuss with him when he comes in for his appointment tomorrow 10/29.

## 2019-08-03 ENCOUNTER — Encounter: Payer: Self-pay | Admitting: Hematology

## 2019-08-03 ENCOUNTER — Inpatient Hospital Stay (HOSPITAL_BASED_OUTPATIENT_CLINIC_OR_DEPARTMENT_OTHER): Payer: BC Managed Care – PPO | Admitting: Hematology

## 2019-08-03 ENCOUNTER — Inpatient Hospital Stay: Payer: BC Managed Care – PPO

## 2019-08-03 ENCOUNTER — Other Ambulatory Visit: Payer: Self-pay

## 2019-08-03 VITALS — BP 130/87 | HR 100 | Temp 98.2°F | Resp 18 | Ht 73.0 in | Wt 228.5 lb

## 2019-08-03 DIAGNOSIS — I2692 Saddle embolus of pulmonary artery without acute cor pulmonale: Secondary | ICD-10-CM

## 2019-08-03 DIAGNOSIS — C787 Secondary malignant neoplasm of liver and intrahepatic bile duct: Secondary | ICD-10-CM | POA: Diagnosis not present

## 2019-08-03 DIAGNOSIS — C20 Malignant neoplasm of rectum: Secondary | ICD-10-CM

## 2019-08-03 DIAGNOSIS — Z7189 Other specified counseling: Secondary | ICD-10-CM

## 2019-08-03 DIAGNOSIS — Z95828 Presence of other vascular implants and grafts: Secondary | ICD-10-CM

## 2019-08-03 DIAGNOSIS — Z5111 Encounter for antineoplastic chemotherapy: Secondary | ICD-10-CM | POA: Diagnosis not present

## 2019-08-03 LAB — CBC WITH DIFFERENTIAL (CANCER CENTER ONLY)
Abs Immature Granulocytes: 0.05 10*3/uL (ref 0.00–0.07)
Basophils Absolute: 0.1 10*3/uL (ref 0.0–0.1)
Basophils Relative: 1 %
Eosinophils Absolute: 0.1 10*3/uL (ref 0.0–0.5)
Eosinophils Relative: 1 %
HCT: 26.5 % — ABNORMAL LOW (ref 39.0–52.0)
Hemoglobin: 8.3 g/dL — ABNORMAL LOW (ref 13.0–17.0)
Immature Granulocytes: 1 %
Lymphocytes Relative: 12 %
Lymphs Abs: 1.2 10*3/uL (ref 0.7–4.0)
MCH: 31.7 pg (ref 26.0–34.0)
MCHC: 31.3 g/dL (ref 30.0–36.0)
MCV: 101.1 fL — ABNORMAL HIGH (ref 80.0–100.0)
Monocytes Absolute: 1.3 10*3/uL — ABNORMAL HIGH (ref 0.1–1.0)
Monocytes Relative: 12 %
Neutro Abs: 7.4 10*3/uL (ref 1.7–7.7)
Neutrophils Relative %: 73 %
Platelet Count: 396 10*3/uL (ref 150–400)
RBC: 2.62 MIL/uL — ABNORMAL LOW (ref 4.22–5.81)
RDW: 20.1 % — ABNORMAL HIGH (ref 11.5–15.5)
WBC Count: 10.1 10*3/uL (ref 4.0–10.5)
nRBC: 0 % (ref 0.0–0.2)

## 2019-08-03 LAB — CMP (CANCER CENTER ONLY)
ALT: 27 U/L (ref 0–44)
AST: 26 U/L (ref 15–41)
Albumin: 2.9 g/dL — ABNORMAL LOW (ref 3.5–5.0)
Alkaline Phosphatase: 473 U/L — ABNORMAL HIGH (ref 38–126)
Anion gap: 10 (ref 5–15)
BUN: 12 mg/dL (ref 8–23)
CO2: 22 mmol/L (ref 22–32)
Calcium: 9.1 mg/dL (ref 8.9–10.3)
Chloride: 106 mmol/L (ref 98–111)
Creatinine: 0.8 mg/dL (ref 0.61–1.24)
GFR, Est AFR Am: >60 mL/min (ref >60)
GFR, Estimated: >60 mL/min (ref >60)
Glucose, Bld: 111 mg/dL — ABNORMAL HIGH (ref 70–99)
Potassium: 4.4 mmol/L (ref 3.5–5.1)
Sodium: 138 mmol/L (ref 135–145)
Total Bilirubin: 0.3 mg/dL (ref 0.3–1.2)
Total Protein: 7.1 g/dL (ref 6.5–8.1)

## 2019-08-03 LAB — IRON AND TIBC
Iron: 19 ug/dL — ABNORMAL LOW (ref 42–163)
Saturation Ratios: 6 % — ABNORMAL LOW (ref 20–55)
TIBC: 316 ug/dL (ref 202–409)
UIBC: 297 ug/dL (ref 117–376)

## 2019-08-03 LAB — FERRITIN: Ferritin: 63 ng/mL (ref 24–336)

## 2019-08-03 LAB — CEA (IN HOUSE-CHCC): CEA (CHCC-In House): 84.87 ng/mL — ABNORMAL HIGH (ref 0.00–5.00)

## 2019-08-03 LAB — TOTAL PROTEIN, URINE DIPSTICK: Protein, ur: NEGATIVE mg/dL

## 2019-08-03 MED ORDER — SODIUM CHLORIDE 0.9 % IV SOLN
165.0000 mg/m2 | Freq: Once | INTRAVENOUS | Status: AC
  Administered 2019-08-03: 380 mg via INTRAVENOUS
  Filled 2019-08-03: qty 15

## 2019-08-03 MED ORDER — ATROPINE SULFATE 1 MG/ML IJ SOLN
0.5000 mg | Freq: Once | INTRAMUSCULAR | Status: AC | PRN
  Administered 2019-08-03: 13:00:00 0.5 mg via INTRAVENOUS

## 2019-08-03 MED ORDER — SODIUM CHLORIDE 0.9 % IV SOLN
2400.0000 mg/m2 | INTRAVENOUS | Status: DC
  Administered 2019-08-03: 14:00:00 5500 mg via INTRAVENOUS
  Filled 2019-08-03: qty 110

## 2019-08-03 MED ORDER — RIVAROXABAN 20 MG PO TABS: 20.0000 mg | ORAL_TABLET | Freq: Every day | ORAL | 2 refills | Status: DC

## 2019-08-03 MED ORDER — LEUCOVORIN CALCIUM INJECTION 350 MG
400.0000 mg/m2 | Freq: Once | INTRAVENOUS | Status: AC
  Administered 2019-08-03: 920 mg via INTRAVENOUS
  Filled 2019-08-03: qty 46

## 2019-08-03 MED ORDER — PALONOSETRON HCL INJECTION 0.25 MG/5ML
INTRAVENOUS | Status: AC
  Filled 2019-08-03: qty 5

## 2019-08-03 MED ORDER — PALONOSETRON HCL INJECTION 0.25 MG/5ML
0.2500 mg | Freq: Once | INTRAVENOUS | Status: AC
  Administered 2019-08-03: 0.25 mg via INTRAVENOUS

## 2019-08-03 MED ORDER — ATROPINE SULFATE 1 MG/ML IJ SOLN
INTRAMUSCULAR | Status: AC
  Filled 2019-08-03: qty 1

## 2019-08-03 MED ORDER — SODIUM CHLORIDE 0.9% FLUSH
10.0000 mL | INTRAVENOUS | Status: DC | PRN
  Administered 2019-08-03: 10 mL
  Filled 2019-08-03: qty 10

## 2019-08-03 MED ORDER — SODIUM CHLORIDE 0.9 % IV SOLN
Freq: Once | INTRAVENOUS | Status: AC
  Administered 2019-08-03: 12:00:00 via INTRAVENOUS
  Filled 2019-08-03: qty 5

## 2019-08-03 NOTE — Patient Instructions (Signed)
[facility] Discharge Instructions for Patients Receiving Chemotherapy  Today you received the following chemotherapy agents Irinotecan, Leucovorin and 5FU  To help prevent nausea and vomiting after your treatment, we encourage you to take your nausea medication as directed.    If you develop nausea and vomiting that is not controlled by your nausea medication, call the clinic.   BELOW ARE SYMPTOMS THAT SHOULD BE REPORTED IMMEDIATELY:  *FEVER GREATER THAN 100.5 F  *CHILLS WITH OR WITHOUT FEVER  NAUSEA AND VOMITING THAT IS NOT CONTROLLED WITH YOUR NAUSEA MEDICATION  *UNUSUAL SHORTNESS OF BREATH  *UNUSUAL BRUISING OR BLEEDING  TENDERNESS IN MOUTH AND THROAT WITH OR WITHOUT PRESENCE OF ULCERS  *URINARY PROBLEMS  *BOWEL PROBLEMS  UNUSUAL RASH Items with * indicate a potential emergency and should be followed up as soon as possible.  Feel free to call the clinic should you have any questions or concerns. The clinic phone number is [phone].  Please show the CHEMO ALERT CARD at check-in to the Emergency Department and triage nurse.   

## 2019-08-03 NOTE — Progress Notes (Signed)
Per Dr. Burr Medico will add bevacizumab 5 mg/kg back into patient's treatment plan. PA team stated PA will take 3-5 days due to patient's insurance, so patient will receive bevacizumab with next cycle on  08/17/19. Orders updated to reflect addition. MD aware and OK with plan.  Leron Croak, PharmD PGY2 Hematology/Oncology Pharmacy Resident 08/03/2019 11:57 AM

## 2019-08-04 ENCOUNTER — Other Ambulatory Visit: Payer: Self-pay | Admitting: Nurse Practitioner

## 2019-08-04 ENCOUNTER — Telehealth: Payer: Self-pay | Admitting: Hematology

## 2019-08-04 ENCOUNTER — Other Ambulatory Visit: Payer: Self-pay | Admitting: *Deleted

## 2019-08-04 DIAGNOSIS — C20 Malignant neoplasm of rectum: Secondary | ICD-10-CM

## 2019-08-04 DIAGNOSIS — C787 Secondary malignant neoplasm of liver and intrahepatic bile duct: Secondary | ICD-10-CM

## 2019-08-04 MED ORDER — OXYCODONE HCL 5 MG PO TABS: 5.0000 mg | ORAL_TABLET | Freq: Four times a day (QID) | ORAL | 0 refills | Status: DC | PRN

## 2019-08-04 NOTE — Telephone Encounter (Addendum)
Received call from pt's wife stating pt needs refill on oxycodone & MS Contin.  She states he has @ 10 left on oxycodone.  Not sure what he has left on MS Contin.  She reports that he is having pain from stent.  She thought that Dr Burr Medico refilled but hasn't heard from pharmacy.  Checked & meds not refilled yesterday. Message routed to Ned Card NP to see if she will agree to refill.  Informed wife that Lattie Haw will send in script for oxycodone.  Wife reports that he is good on MS Contin for now.

## 2019-08-04 NOTE — Telephone Encounter (Signed)
Scheduled appt per 10/29 los.  Sent treatment as an add-on for 11/11.  Will contact patient once treatment has been added.

## 2019-08-05 ENCOUNTER — Inpatient Hospital Stay: Payer: BC Managed Care – PPO

## 2019-08-05 VITALS — BP 120/73 | HR 102 | Temp 98.9°F

## 2019-08-05 DIAGNOSIS — Z5111 Encounter for antineoplastic chemotherapy: Secondary | ICD-10-CM | POA: Diagnosis not present

## 2019-08-05 MED ORDER — PEGFILGRASTIM-CBQV 6 MG/0.6ML ~~LOC~~ SOSY
6.0000 mg | PREFILLED_SYRINGE | Freq: Once | SUBCUTANEOUS | Status: AC
  Administered 2019-08-05: 6 mg via SUBCUTANEOUS

## 2019-08-05 MED ORDER — HEPARIN SOD (PORK) LOCK FLUSH 100 UNIT/ML IV SOLN
500.0000 [IU] | Freq: Once | INTRAVENOUS | Status: AC
  Administered 2019-08-05: 500 [IU] via INTRAVENOUS
  Filled 2019-08-05: qty 5

## 2019-08-05 MED ORDER — SODIUM CHLORIDE 0.9% FLUSH
10.0000 mL | INTRAVENOUS | Status: DC | PRN
  Administered 2019-08-05: 10 mL via INTRAVENOUS
  Filled 2019-08-05: qty 10

## 2019-08-07 ENCOUNTER — Other Ambulatory Visit: Payer: Self-pay

## 2019-08-07 ENCOUNTER — Inpatient Hospital Stay: Payer: BC Managed Care – PPO

## 2019-08-07 ENCOUNTER — Telehealth: Payer: Self-pay

## 2019-08-07 ENCOUNTER — Inpatient Hospital Stay: Payer: BC Managed Care – PPO | Attending: Adult Health | Admitting: Medical

## 2019-08-07 VITALS — BP 117/72 | HR 80 | Temp 98.2°F | Resp 17 | Ht 73.0 in | Wt 222.3 lb

## 2019-08-07 DIAGNOSIS — C20 Malignant neoplasm of rectum: Secondary | ICD-10-CM | POA: Diagnosis present

## 2019-08-07 DIAGNOSIS — Z86711 Personal history of pulmonary embolism: Secondary | ICD-10-CM | POA: Diagnosis not present

## 2019-08-07 DIAGNOSIS — Z5189 Encounter for other specified aftercare: Secondary | ICD-10-CM | POA: Diagnosis not present

## 2019-08-07 DIAGNOSIS — R531 Weakness: Secondary | ICD-10-CM

## 2019-08-07 DIAGNOSIS — D500 Iron deficiency anemia secondary to blood loss (chronic): Secondary | ICD-10-CM | POA: Diagnosis not present

## 2019-08-07 DIAGNOSIS — K921 Melena: Secondary | ICD-10-CM

## 2019-08-07 DIAGNOSIS — Z5112 Encounter for antineoplastic immunotherapy: Secondary | ICD-10-CM | POA: Diagnosis present

## 2019-08-07 DIAGNOSIS — Z5111 Encounter for antineoplastic chemotherapy: Secondary | ICD-10-CM | POA: Diagnosis present

## 2019-08-07 DIAGNOSIS — C787 Secondary malignant neoplasm of liver and intrahepatic bile duct: Secondary | ICD-10-CM

## 2019-08-07 DIAGNOSIS — Z7901 Long term (current) use of anticoagulants: Secondary | ICD-10-CM | POA: Diagnosis not present

## 2019-08-07 DIAGNOSIS — R5383 Other fatigue: Secondary | ICD-10-CM | POA: Diagnosis not present

## 2019-08-07 DIAGNOSIS — D649 Anemia, unspecified: Secondary | ICD-10-CM

## 2019-08-07 DIAGNOSIS — R110 Nausea: Secondary | ICD-10-CM

## 2019-08-07 LAB — CBC WITH DIFFERENTIAL (CANCER CENTER ONLY)
Abs Immature Granulocytes: 0.3 10*3/uL — ABNORMAL HIGH (ref 0.00–0.07)
Band Neutrophils: 4 %
Basophils Absolute: 0.3 10*3/uL — ABNORMAL HIGH (ref 0.0–0.1)
Basophils Relative: 1 %
Eosinophils Absolute: 0.3 10*3/uL (ref 0.0–0.5)
Eosinophils Relative: 1 %
HCT: 22.5 % — ABNORMAL LOW (ref 39.0–52.0)
Hemoglobin: 7 g/dL — ABNORMAL LOW (ref 13.0–17.0)
Lymphocytes Relative: 4 %
Lymphs Abs: 1.3 10*3/uL (ref 0.7–4.0)
MCH: 31.8 pg (ref 26.0–34.0)
MCHC: 31.1 g/dL (ref 30.0–36.0)
MCV: 102.3 fL — ABNORMAL HIGH (ref 80.0–100.0)
Metamyelocytes Relative: 1 %
Monocytes Absolute: 0 10*3/uL — ABNORMAL LOW (ref 0.1–1.0)
Monocytes Relative: 0 %
Neutro Abs: 30.5 10*3/uL — ABNORMAL HIGH (ref 1.7–7.7)
Neutrophils Relative %: 89 %
Platelet Count: 367 10*3/uL (ref 150–400)
RBC: 2.2 MIL/uL — ABNORMAL LOW (ref 4.22–5.81)
RDW: 19 % — ABNORMAL HIGH (ref 11.5–15.5)
WBC Count: 32.8 10*3/uL — ABNORMAL HIGH (ref 4.0–10.5)
nRBC: 0 % (ref 0.0–0.2)

## 2019-08-07 LAB — PREPARE RBC (CROSSMATCH)

## 2019-08-07 LAB — CMP (CANCER CENTER ONLY)
ALT: 33 U/L (ref 0–44)
AST: 26 U/L (ref 15–41)
Albumin: 2.8 g/dL — ABNORMAL LOW (ref 3.5–5.0)
Alkaline Phosphatase: 443 U/L — ABNORMAL HIGH (ref 38–126)
Anion gap: 11 (ref 5–15)
BUN: 18 mg/dL (ref 8–23)
CO2: 22 mmol/L (ref 22–32)
Calcium: 9 mg/dL (ref 8.9–10.3)
Chloride: 104 mmol/L (ref 98–111)
Creatinine: 1.01 mg/dL (ref 0.61–1.24)
GFR, Est AFR Am: >60 mL/min (ref >60)
GFR, Estimated: >60 mL/min (ref >60)
Glucose, Bld: 128 mg/dL — ABNORMAL HIGH (ref 70–99)
Potassium: 4.6 mmol/L (ref 3.5–5.1)
Sodium: 137 mmol/L (ref 135–145)
Total Bilirubin: 0.3 mg/dL (ref 0.3–1.2)
Total Protein: 6.8 g/dL (ref 6.5–8.1)

## 2019-08-07 LAB — SAMPLE TO BLOOD BANK

## 2019-08-07 MED ORDER — HEPARIN SOD (PORK) LOCK FLUSH 100 UNIT/ML IV SOLN
500.0000 [IU] | Freq: Every day | INTRAVENOUS | Status: AC | PRN
  Administered 2019-08-07: 17:00:00 500 [IU]
  Filled 2019-08-07: qty 5

## 2019-08-07 MED ORDER — SODIUM CHLORIDE 0.9% FLUSH
3.0000 mL | INTRAVENOUS | Status: AC | PRN
  Administered 2019-08-07: 17:00:00 10 mL
  Filled 2019-08-07: qty 10

## 2019-08-07 MED ORDER — ACETAMINOPHEN 325 MG PO TABS
650.0000 mg | ORAL_TABLET | Freq: Once | ORAL | Status: AC
  Administered 2019-08-07: 12:00:00 650 mg via ORAL

## 2019-08-07 MED ORDER — ONDANSETRON HCL 8 MG PO TABS
8.0000 mg | ORAL_TABLET | Freq: Once | ORAL | Status: AC
  Administered 2019-08-07: 8 mg via ORAL

## 2019-08-07 MED ORDER — ACETAMINOPHEN 325 MG PO TABS
ORAL_TABLET | ORAL | Status: AC
  Filled 2019-08-07: qty 2

## 2019-08-07 MED ORDER — SODIUM CHLORIDE 0.9 % IV SOLN
Freq: Once | INTRAVENOUS | Status: AC
  Administered 2019-08-07: 13:00:00 via INTRAVENOUS
  Filled 2019-08-07: qty 250

## 2019-08-07 MED ORDER — ONDANSETRON HCL 8 MG PO TABS
ORAL_TABLET | ORAL | Status: AC
  Filled 2019-08-07: qty 1

## 2019-08-07 MED ORDER — DIPHENHYDRAMINE HCL 25 MG PO CAPS
ORAL_CAPSULE | ORAL | Status: AC
  Filled 2019-08-07: qty 1

## 2019-08-07 MED ORDER — DIPHENHYDRAMINE HCL 25 MG PO CAPS
25.0000 mg | ORAL_CAPSULE | Freq: Once | ORAL | Status: AC
  Administered 2019-08-07: 12:00:00 25 mg via ORAL

## 2019-08-07 NOTE — Progress Notes (Signed)
[facility] Progress Note   Billy Mendoza [id number] [birth year] 68 y.o.  Billy Mendoza is managed by [name]  Actively treated with chemotherapy/immunotherapy/hormonal therapy: yes  Current therapy: FOLFIRI with plans to add Avastin with his next chemotherapy cycle.  Last treated: 08/03/2019 (cycle #4)  Next scheduled appointment with provider: 08/16/2019  Assessment: Plan:    Rectal cancer metastasized to liver [facility])  Iron deficiency anemia due to chronic blood loss  Fatigue, unspecified type  Weakness   Metastatic rectal cancer with liver metastasis: The patient is status post cycle 4 of FOLFIRI which was dosed on 08/03/2019.  Plans are to add Avastin to cycle 5 of chemotherapy which is scheduled to be dosed on 08/16/2019.  History of iron deficiency anemia due to chronic blood loss with increasing fatigue and weakness: Labs were collected today with the patient CBC returning showing a hemoglobin of 7.0 today. The patient was transfused with 2 units of packed red blood cells today.  Please see After Visit Summary for patient specific instructions.  Future Appointments  Date Time Provider [place]  08/07/2019 11:00 AM [facility] LAB 6 [facility] None  08/07/2019 11:15 AM [facility] [place] FLUSH [facility] None  08/07/2019 11:30 AM Billy Mendoza, Lucianne Lei E., PA-C [facility] None  08/16/2019  7:45 AM [facility] LAB 4 [facility] None  08/16/2019  8:00 AM [facility] INFUSION [facility] None  08/16/2019  8:20 AM Truitt Merle, MD [facility] None  08/16/2019  9:15 AM [facility] INFUSION [facility] None  08/18/2019 10:30 AM [facility] [place] FLUSH [facility] None  09/07/2019  9:30 AM [facility] LAB ONLY [facility] None  09/07/2019  9:45 AM [facility] [place] FLUSH [facility] None  09/07/2019 10:20 AM Truitt Merle, MD [facility] None  09/07/2019 11:30 AM [facility] INFUSION [facility] None  09/09/2019 10:00 AM [facility] [place] FLUSH [facility] None  09/21/2019 10:00 AM  [facility] LAB 3 [facility] None  09/21/2019 10:15 AM [facility] [place] FLUSH [facility] None  09/21/2019 10:40 AM Truitt Merle, MD [facility] None  09/21/2019 11:30 AM [facility] INFUSION [facility] None  09/23/2019 10:00 AM [facility] [place] FLUSH [facility] None    No orders of the defined types were placed in this encounter.      Subjective:   Patient ID:  Billy Mendoza is a 68 y.o. (DOB 1951/09/07) male.  Chief Complaint: No chief complaint on file.   HPI Billy Mendoza  Is a 68 y.o. male with a diagnosis of a metastatic rectal cancer with liver metastasis.  Billy Mendoza is managed by [name] and is status post cycle 4 of FOLFIRI which was dosed on 08/03/2019.  Plans are to add Avastin to cycle 5 of chemotherapy which is scheduled to be dosed on 08/16/2019.  He additionally has a history of anemia due to chronic blood loss.  Mrs. Casillas called this morning stating he is not feeling well. He has been very weak, staying in bed, pale, and with no energy. He denies evidence of rectal bleeding now. He did have a recent episode of severe constipation after which there was a 24 hour period that he noted blood in his stool every time he he had a bowel movement.      Medications: I have reviewed the patient's current medications.  Allergies: No Known Allergies  Past Medical History:  Diagnosis Date  . colorectal ca dx'd 07/2018  . Liver metastases ([place]) dx'd 07/2018  . Lung metastases ([place]) dx'd 07/2018    Past Surgical History:  Procedure Laterality Date  . COLONIC STENT PLACEMENT N/A 01/19/2019  Procedure: COLONIC STENT PLACEMENT;  Surgeon: Carol Ada, MD;  Location: [facility];  Service: Endoscopy;  Laterality: N/A;  . FLEXIBLE SIGMOIDOSCOPY N/A 01/19/2019   Procedure: FLEXIBLE SIGMOIDOSCOPY;  Surgeon: Carol Ada, MD;  Location: [facility];  Service: Endoscopy;  Laterality: N/A;  . IR IMAGING GUIDED PORT INSERTION  08/16/2018  . IR US GUIDE BX ASP/DRAIN  08/16/2018     Family History  Problem Relation Age of Onset  . Cancer Mother 70       breast cancer  . Atrial fibrillation Father   . Heart failure Father   . Cancer Maternal Grandmother        lung cancer     Social History   Socioeconomic History  . Marital status: Married    Spouse name: Not on file  . Number of children: Not on file  . Years of education: Not on file  . Highest education level: Not on file  Occupational History  . Not on file  Social Needs  . Financial resource strain: Not on file  . Food insecurity    Worry: Not on file    Inability: Not on file  . Transportation needs    Medical: No    Non-medical: No  Tobacco Use  . Smoking status: Former Smoker    Years: 10.00    Types: Cigarettes, Cigars  . Smokeless tobacco: Never Used  . Tobacco comment: quit in Sep 2019   Substance and Sexual Activity  . Alcohol use: Yes    Alcohol/week: 1.0 - 2.0 standard drinks    Types: 1 - 2 Shots of liquor per week  . Drug use: Never  . Sexual activity: Not on file  Lifestyle  . Physical activity    Days per week: Not on file    Minutes per session: Not on file  . Stress: Not on file  Relationships  . Social Herbalist on phone: Not on file    Gets together: Not on file    Attends religious service: Not on file    Active member of club or organization: Not on file    Attends meetings of clubs or organizations: Not on file    Relationship status: Not on file  . Intimate partner violence    Fear of current or ex partner: Not on file    Emotionally abused: Not on file    Physically abused: Not on file    Forced sexual activity: Not on file  Other Topics Concern  . Not on file  Social History Narrative  . Not on file    Past Medical History, Surgical history, Social history, and Family history were reviewed and updated as appropriate.   Please see review of systems for further details on the patient's review from today.   Review of Systems:  Review of  Systems  Constitutional: Positive for fatigue. Negative for appetite change, chills, diaphoresis and fever.  HENT: Negative for dental problem, mouth sores and trouble swallowing.   Respiratory: Negative for cough, chest tightness and shortness of breath.   Cardiovascular: Negative for chest pain and palpitations.  Gastrointestinal: Negative for constipation, diarrhea, nausea and vomiting.  Skin: Positive for pallor.  Neurological: Positive for weakness. Negative for dizziness, syncope and headaches.    Objective:   Physical Exam:  There were no vitals taken for this visit. ECOG: 1  Physical Exam Constitutional:      General: He is not in acute distress.    Appearance: He is  not diaphoretic.  HENT:     Head: Normocephalic and atraumatic.  Eyes:     Comments: Conjunctiva is pale.  Cardiovascular:     Rate and Rhythm: Normal rate and regular rhythm.     Heart sounds: Normal heart sounds. No murmur. No friction rub. No gallop.   Pulmonary:     Effort: Pulmonary effort is normal. No respiratory distress.     Breath sounds: Normal breath sounds. No wheezing or rales.  Skin:    General: Skin is warm and dry.     Coloration: Skin is pale.     Findings: No erythema or rash.  Neurological:     Mental Status: He is alert.     Coordination: Coordination normal.     Gait: Gait normal.     Lab Review:     Component Value Date/Time   NA 138 08/03/2019 1020   K 4.4 08/03/2019 1020   CL 106 08/03/2019 1020   CO2 22 08/03/2019 1020   GLUCOSE 111 (H) 08/03/2019 1020   BUN 12 08/03/2019 1020   CREATININE 0.80 08/03/2019 1020   CALCIUM 9.1 08/03/2019 1020   PROT 7.1 08/03/2019 1020   ALBUMIN 2.9 (L) 08/03/2019 1020   AST 26 08/03/2019 1020   ALT 27 08/03/2019 1020   ALKPHOS 473 (H) 08/03/2019 1020   BILITOT 0.3 08/03/2019 1020   GFRNONAA >60 08/03/2019 1020   GFRAA >60 08/03/2019 1020       Component Value Date/Time   WBC 10.1 08/03/2019 1020   WBC 5.6 06/02/2019 0337    RBC 2.62 (L) 08/03/2019 1020   HGB 8.3 (L) 08/03/2019 1020   HCT 26.5 (L) 08/03/2019 1020   PLT 396 08/03/2019 1020   MCV 101.1 (H) 08/03/2019 1020   MCH 31.7 08/03/2019 1020   MCHC 31.3 08/03/2019 1020   RDW 20.1 (H) 08/03/2019 1020   LYMPHSABS 1.2 08/03/2019 1020   MONOABS 1.3 (H) 08/03/2019 1020   EOSABS 0.1 08/03/2019 1020   BASOSABS 0.1 08/03/2019 1020   -------------------------------  Imaging from last 24 hours (if applicable):  Radiology interpretation: No results found.

## 2019-08-07 NOTE — Patient Instructions (Signed)

## 2019-08-07 NOTE — Progress Notes (Signed)
Critical lab value received at 1150: Hgb 7.0 PA Lucianne Lei aware.  Pt transferred to section C in infusion suite with belongings, report given in person to [facility].  Pt received 8 mg PO zofran d/t mild nausea, reports relief of nausea at time of transfer.

## 2019-08-07 NOTE — Telephone Encounter (Signed)
Patient's wife calls stating he is not feeling well, very weak, staying in bed, pale, no energy, denies rectal bleeding. Per Dr. Burr Medico will have him evaluated by Sandi Mealy PA in [facility].  He did say he had a twenty four stretch after he had been severely constipated where there was blood in the stool every time he went.    He will come in at 11:00 for lab/PF and to see Sandi Mealy PA in [facility].

## 2019-08-08 LAB — BPAM RBC
Blood Product Expiration Date: 202012022359
Blood Product Expiration Date: 202012032359
ISSUE DATE / TIME: 202011021328
ISSUE DATE / TIME: 202011021328
Unit Type and Rh: 5100
Unit Type and Rh: 5100

## 2019-08-08 LAB — TYPE AND SCREEN
ABO/RH(D): O POS
Antibody Screen: NEGATIVE
Unit division: 0
Unit division: 0

## 2019-08-11 NOTE — Progress Notes (Signed)
Billy Mendoza   Telephone:([phone] [phone]) [phone]   Clinic Follow up Note   Patient Care Team: Merry Proud, MD as PCP - General (Internal Medicine) Truitt Merle, MD as Consulting Physician (Hematology) Carol Ada, MD as Consulting Physician (Gastroenterology)  Date of Service:  08/16/2019  CHIEF COMPLAINT: F/u of rectal cancer metastasized to liver  SUMMARY OF ONCOLOGIC HISTORY: Oncology History Overview Note  Cancer Staging Rectal cancer metastasized to liver [facility]) Staging form: Colon and Rectum, AJCC 8th Edition - Clinical stage from 07/28/2018: Stage IVB (cTX, cNX, pM1b) - Signed by Truitt Merle, MD on 08/18/2018     Rectal cancer metastasized to liver ([place])  07/28/2018 Initial Biopsy   Biopsy 07/28/18 Final Microscopic Diagnosis A.Colon, Ascending, Polyp:  -Fragments of tubular adenoma -No high-grade dysplasia of malignancy.  B. Rectum, Mass, Polyp:  -Invasive adenocarcinoma, moderately - poorly differentiatred.    07/28/2018 Cancer Staging   Staging form: Colon and Rectum, AJCC 8th Edition - Clinical stage from 07/28/2018: Stage IVB (cTX, cNX, pM1b) - Signed by Truitt Merle, MD on 08/18/2018   08/04/2018 Initial Diagnosis   Rectal cancer metastasized to liver ([place])   08/16/2018 Pathology Results   Biospy  Diagnosis 08/16/18 Liver, needle/core biopsy - ADENOCARCINOMA. Microscopic Comment By morphology, the findings are compatible with the provided clinical history of colorectal adenocarcinoma. Results reported to [name] on 08/18/2018. Intradepartmental consultation was obtained ([name]).    08/16/2018 Miscellaneous   Foundation 1 genomic testing: MS-stable Tumor mutation burden  3/Mb K-ras/NRAS wild-type BRAF V600 E KDMSA R266Q PTEN T322f* SMAD4 loss TP53 loss     08/18/2018 Procedure   Colonoscopy by Dr. HBenson Norwayshowed malignant appearing partially obstructing tumor in the rectum, 8 cm above anal verge.   08/18/2018  - 09/07/2018 Chemotherapy   CAPOX q3weeks with Xeloda 20073mBID 2 weeks on, 1 weeks off starting 08/18/18.  Swithced treatment due to BRAF mutation.    09/07/2018 - 12/24/2018 Chemotherapy   FOLFOXIRI and avastin every 2 weeks starting 09/07/18. Oxaliplatin held starting with cycle 8 and continue with FOLFIRI and Avastin. D/c due to disease progression.    11/04/2018 Imaging   CT CAP 11/04/18 IMPRESSION: 1. Peripheral filling defect in the right main pulmonary artery extending primarily into the right lower lobe pulmonary artery. While the peripheral location indicates some degree of chronicity of this pulmonary embolus, this is a new finding compared to 07/28/2018. The right ventricular to left ventricular ratio is 0.7, which does not specifically indicate right heart strain. 2. The malignancy appears considerably improved, with reduced size pulmonary nodules; reduced size and increased internal necrosis of the hepatic metastatic lesions; marked improvement in the thoracic adenopathy; reduced conspicuity of the rectal tumor; and significant reduction in perirectal tumor burden/adenopathy. 3. Other imaging findings of potential clinical significance: Aortic Atherosclerosis (ICD10-I70.0). Coronary atherosclerosis. Bilateral renal cysts. Small umbilical hernia contains adipose tissue.  Critical Value/emergent results were called by telephone at the time of interpretation on 11/04/2018 at 3:05 pm to Dr. YATruitt Merle who verbally acknowledged these results.   01/18/2019 Imaging   CT AP W contrast  IMPRESSION: 1. Colonic obstruction resulting from rectal carcinoma. 2. Hepatic metastatic disease which is decreased in size compared with the prior examination of 11/04/2018. 3. Multiple right middle lobe and right lower lobe pulmonary nodules most consistent with metastatic disease.   02/01/2019 - 06/01/2019 Chemotherapy   second line BRAFTOVI(encorafenib) 30018maily and MEKTOVI(binimetinib),  66m26m2h and Vectibix q2weeks starting 02/01/19.  -He has held  Mektovi 02/19/19-04/06/19 due to RT  -Stopped on 06/01/19 due to disease progression    02/13/2019 Imaging   CT Chest  IMPRESSION: Small bilateral pulmonary metastases, mildly progressive from January 2020.  Multifocal hepatic metastases, incompletely visualized, favored to be progressive from April 2020.  Aortic Atherosclerosis (ICD10-I70.0).    02/22/2019 - 03/21/2019 Radiation Therapy   -Palliative local radiation  -Holding mektovi (binimetinib) during RT -Due to significant rectal pain, diarrhea and stool incontinence, he stopped RT after 03/13/19. He was only able to finish 1/2 remianing treatments on 03/21/19.    05/31/2019 Imaging   CT CAP W contrast 05/31/19  IMPRESSION: 1. Pulmonary emboli with a saddle embolus. Critical Value/emergent results were called by telephone at the time of interpretation on 05/31/2019 at 12:03 pm to Cira Rue, NP , who verbally acknowledged these results. 2. Slight increase in perirectal soft tissue nodularity with overall worsening in pulmonary and hepatic metastatic disease. Omental nodularity appears slightly improved. 3. New mediastinal and periportal adenopathy. Aortocaval lymph node is not enlarged by CT size criteria but is new and therefore considered suspicious for metastatic involvement. 4. Aortic atherosclerosis (ICD10-170.0). Coronary artery calcification.   06/08/2019 -  Chemotherapy   FOLFOXIRI q2weeks starting 06/08/19. Due to fatigue, diarrhea, nausea and low blood counts he was switched to FOLFIRI starting 06/22/19. Postponed the addition of Avastin until C4 on 08/03/19 given recent PE.      CURRENT THERAPY:  FOLFOXIRI q2weeks starting 06/08/19. Due to fatigue, diarrhea, nausea and low blood counts he was switched to FOLFIRI starting 06/22/19. Postponed the addition of Avastin given recent PE.  INTERVAL HISTORY:  Billy Mendoza is here for a follow up and treatment. He  presents to the clinic alone. He notes he came for blood transfusion last week and felt better afterward. He notes more bleeding this week since 2 days ago and feels he may need another blood transfusion. He notes he still on Xarelto 70m. He takes Oxycodone 4 tabs a day sometimes 5 tabs a day depending on his pain. He  Has been taking MS Contin as needed. Given recent increase in pain he should take BID. He feels his rectal pain is still related to stent and has considered removing it but understands he can have another bowl blockage. He notes he can still do things at home but much slower. He notes he has not returned to work since L32and COVID outbreak at his job.     REVIEW OF SYSTEMS:   Constitutional: Denies fevers, chills or abnormal weight loss (+) Lower energy Eyes: Denies blurriness of vision Ears, nose, mouth, throat, and face: Denies mucositis or sore throat Respiratory: Denies cough, dyspnea or wheezes Cardiovascular: Denies palpitation, chest discomfort or lower extremity swelling Gastrointestinal:  Denies nausea, heartburn or change in bowel habits (+) Rectal bleeding (+) rectal pain Skin: Denies abnormal skin rashes Lymphatics: Denies new lymphadenopathy or easy bruising Neurological:Denies numbness, tingling or new weaknesses Behavioral/Psych: Mood is stable, no new changes  All other systems were reviewed with the patient and are negative.  MEDICAL HISTORY:  Past Medical History:  Diagnosis Date  . colorectal ca dx'd 07/2018  . [place]  . [place]    SURGICAL HISTORY: Past Surgical History:  Procedure Laterality Date  . COLONIC STENT PLACEMENT N/A 01/19/2019   Procedure: COLONIC STENT PLACEMENT;  Surgeon: HCarol Ada MD;  Location: [facility];  Service: Endoscopy;  Laterality: N/A;  . FLEXIBLE SIGMOIDOSCOPY N/A 01/19/2019  Procedure: FLEXIBLE SIGMOIDOSCOPY;  Surgeon: Carol Ada, MD;  Location: [facility];  Service: Endoscopy;  Laterality: N/A;  . IR IMAGING GUIDED PORT INSERTION  08/16/2018  . IR US GUIDE BX ASP/DRAIN  08/16/2018    I have reviewed the social history and family history with the patient and they are unchanged from previous note.  ALLERGIES:  has No Known Allergies.  MEDICATIONS:  Current Outpatient Medications  Medication Sig Dispense Refill  . acetaminophen (TYLENOL) 325 MG tablet Take 650 mg by mouth every 6 (six) hours as needed for moderate pain.    . diphenoxylate-atropine (LOMOTIL) 2.5-0.025 MG tablet Take 1-2 tablets by mouth 4 (four) times daily as needed for diarrhea or loose stools. Up to 8 tabs daily for severe diarrhea 60 tablet 2  . magnesium oxide (MAGNESIUM-OXIDE) 400 (241.3 Mg) MG tablet Take 0.5 tablets (200 mg total) by mouth 2 (two) times daily. 30 tablet 1  . morphine (MS CONTIN) 15 MG 12 hr tablet Take 1 tablet (15 mg total) by mouth every 12 (twelve) hours as needed for pain. 60 tablet 0  . oxyCODONE (OXY IR/ROXICODONE) 5 MG immediate release tablet Take 1-2 tablets (5-10 mg total) by mouth every 6 (six) hours as needed for severe pain. 90 tablet 0  . Rivaroxaban (XARELTO) 15 MG TABS tablet Take 1 tablet (15 mg total) by mouth 2 (two) times daily with a meal. 30 tablet 1  . traMADol (ULTRAM) 50 MG tablet Take 1 tablet (50 mg total) by mouth every 6 (six) hours as needed. (Patient not taking: Reported on 08/03/2019) 30 tablet 0  . VITAMIN E PO Take 1 tablet by mouth daily.     No current facility-administered medications for this visit.     PHYSICAL EXAMINATION: ECOG PERFORMANCE STATUS: 2 - Symptomatic, <50% confined to bed  Vitals:   08/16/19 0909  BP: 110/81  Pulse: (!) 113  Resp: 16  SpO2: 100%   Filed Weights   08/16/19 0909  Weight: 215 lb 14.4 oz (97.9 kg)    GENERAL:alert, no distress and comfortable SKIN: skin color, texture, turgor are normal, no rashes or significant lesions EYES: normal, Conjunctiva are pink and  non-injected, sclera clear  NECK: supple, thyroid normal size, non-tender, without nodularity LYMPH:  no palpable lymphadenopathy in the cervical, axillary  LUNGS: clear to auscultation and percussion with normal breathing effort HEART: regular rate & rhythm and no murmurs and no lower extremity edema ABDOMEN:abdomen soft, non-tender and normal bowel sounds Musculoskeletal:no cyanosis of digits and no clubbing  NEURO: alert & oriented x 3 with fluent speech, no focal motor/sensory deficits  LABORATORY DATA:  I have reviewed the data as listed CBC Latest Ref Rng & Units 08/16/2019 08/07/2019 08/03/2019  WBC 4.0 - 10.5 K/uL 12.1(H) 32.8(H) 10.1  Hemoglobin 13.0 - 17.0 g/dL 7.4(L) 7.0(L) 8.3(L)  Hematocrit 39.0 - 52.0 % 24.5(L) 22.5(L) 26.5(L)  Platelets 150 - 400 K/uL 212 367 396     CMP Latest Ref Rng & Units 08/16/2019 08/07/2019 08/03/2019  Glucose 70 - 99 mg/dL 109(H) 128(H) 111(H)  BUN 8 - 23 mg/dL 15 18 12   Creatinine 0.61 - 1.24 mg/dL 0.96 1.01 0.80  Sodium 135 - 145 mmol/L 140 137 138  Potassium 3.5 - 5.1 mmol/L 4.1 4.6 4.4  Chloride 98 - 111 mmol/L 109 104 106  CO2 22 - 32 mmol/L 19(L) 22 22  Calcium 8.9 - 10.3 mg/dL 8.7(L) 9.0 9.1  Total Protein 6.5 - 8.1 g/dL 6.7 6.8 7.1  Total  Bilirubin 0.3 - 1.2 mg/dL <0.2(L) 0.3 0.3  Alkaline Phos 38 - 126 U/L 411(H) 443(H) 473(H)  AST 15 - 41 U/L 17 26 26   ALT 0 - 44 U/L 18 33 27      RADIOGRAPHIC STUDIES: I have personally reviewed the radiological images as listed and agreed with the findings in the report. No results found.   ASSESSMENT & PLAN:  Billy Mendoza is a 68 y.o. male with    1. Proximal rectal adenocarcinoma, metastatic to liver,lungsandnodes, MSS,KRAS/NRAS wild type,BRAF V600E mutation (+) -He was diagnosed in10/2019.He progressed on first-line FOLFOXIRI and avastin and he progressed on second-line oral BRAF/MEK/EGFR inhibitors(Braftovi, Mektovi andVectibix q2weeks)which he started on 02/01/19.  -HereceivedRadiationin May 2020 to his rectal tumor -Dueto previous good response in liver,Irestarted him onIV FOLFOXIRIwith lower oxaliplatin dose due to neuropathy on 06/08/19. Due to poor tolerance this was reduced to FOLFIRI starting 06/22/19 and added Avastin with C4. He tolerated reduced chemo much better, will continue. -This week he has had recurrent rectal bleeding. He also notes recent increase in rectal pain. Given increase in pain I will scan him sooner in 1-2 weeks. He is agreeable.  -Labs reviewed and adequate to proceed with C5 FOLFIRI today. Will give Blood transfusion this week.  -F/u after scan.    2.History ofbowel obstruction from rectal tumor -s/p stent placement by Dr. Benson Norway on 01/19/2019,he declined loop colostomybut agree to keep surgery as last resort.  -He received a course of radiation -rectal pain mostly mild and manageable, mainly from stent placement.  -He recently has had pain progression. He remains on MS Contin 11m BID (every dose taken) and Oxycodone 558mq6hours. Will continue. Will increase MS Contin dose if needed.  -To avoid constipation, I again encouraged him to use prophylactic daily Miralax every 1-2 days. If no BM for 3 days he can use Magnesium Citrate. He understands.  -He still feels his rectal pain is still attributes to stent. He has considered removing stent but understands that can lead to another bowel blockage.   3. Rectal bleeding -secondary to cancer and anticoagulation  -Intermittent, worse with anticoagulation -Has mildly recurred for the past 2-3 days. I strongly encouraged him to take prophylactic stool softener daily to avoid constipation. Continue monitoring. -will reduce Xarelto to 155maily   4. Iron deficiency anemia, secondary to blood loss from tumor  -He receivedinitial 2 doses ofIV Feraheme on 5/20/20and 03/02/19.  -I encouraged him to take oral prenatal vitamin with iron.  -Last blood transfusionon 08/09/19.  Given rectal bleeding this week. Hg 7.4 today (08/16/19). Will give blood transfusion this week.   5.History of right PE, RecentAcute saddle PE and left LE DVTin 05/2019 -His PE is likely related to his underlying metastatic colon cancer and chemotherapy, especially Avastin. -He was already on Xarelto but not consistently due to occasional epistaxis and rectal bleeding.  -His 05/2019 PE/DVT was treated with Lovenox injections BID for 2 months, then returned to Xarelto 9m72m encouraged him to continue if his rectal bleeding is mild, due to his high risk of thrombosis. Certainly if he develops severe rectal bleeding, will hold or reduce dose  -Given recent mild rectal bleeding, will reduce Xarelto to 15mg24m/11/20)  6. HTN  -TakesAmlodipine -BPusually controlled  7. Goal of care discussion -He is full code now -He understands his disease is incurable,and the goal of therapy is palliative to prolong his life.   Plan -I will reduce Xarelto from 9mg 63m5mg d83m due to his rectal  bleeding, prescription called in today -refilled Oxycodone today  -Blood transfusion this week, 2u  -CT CAP W Contrast in 1-2 weeks  -labs reviewed and adequate to proceed with C5 FOLFIRI today at same dosewith Udenyca on day 3 -Lab, flush, f/u and chemo FOLFIRI and Avastin in 3 weeks. Postpone his next cycle due to Holiday    No problem-specific Assessment & Plan notes found for this encounter.   Orders Placed This Encounter  Procedures  . CT Abdomen Pelvis W Contrast    Standing Status:   Future    Standing Expiration Date:   08/15/2020    Order Specific Question:   If indicated for the ordered procedure, I authorize the administration of contrast media per Radiology protocol    Answer:   Yes    Order Specific Question:   Preferred imaging location?    Answer:   [facility]    Order Specific Question:   Is Oral Contrast requested for this exam?    Answer:   Yes, Per Radiology  protocol    Order Specific Question:   Radiology Contrast Protocol - do NOT remove file path    Answer:   \\[email or web address].pdf  . CT Chest W Contrast    Standing Status:   Future    Standing Expiration Date:   08/15/2020    Order Specific Question:   If indicated for the ordered procedure, I authorize the administration of contrast media per Radiology protocol    Answer:   Yes    Order Specific Question:   Preferred imaging location?    Answer:   [facility]    Order Specific Question:   Radiology Contrast Protocol - do NOT remove file path    Answer:   \\[email or web address].pdf  . Practitioner attestation of consent    I, the ordering practitioner, attest that I have discussed with the patient the benefits, risks, side effects, alternatives, likelihood of achieving goals and potential problems during recovery for the procedure listed.    Standing Status:   Future    Number of Occurrences:   1    Standing Expiration Date:   08/15/2020    Order Specific Question:   Procedure    Answer:   Blood Product(s)  . Complete patient signature process for consent form    Standing Status:   Future    Number of Occurrences:   1    Standing Expiration Date:   08/15/2020  . Care order/instruction    Transfuse Parameters    Standing Status:   Future    Number of Occurrences:   1    Standing Expiration Date:   08/15/2020  . Type and screen         Standing Status:   Future    Number of Occurrences:   1    Standing Expiration Date:   08/15/2020  . Prepare RBC    Standing Status:   Standing    Number of Occurrences:   1    Order Specific Question:   # of Units    Answer:   2 units    Order Specific Question:   Transfusion Indications    Answer:   Symptomatic Anemia    Order Specific Question:   If emergent release call blood bank    Answer:   Not emergent release   All questions were answered. The patient knows to call the clinic with any  problems, questions or concerns. No barriers to learning was detected. I spent  20 minutes counseling the patient face to face. The total time spent in the appointment was 25 minutes and more than 50% was on counseling and review of test results     Truitt Merle, MD 08/16/2019   I, Joslyn Devon, am acting as scribe for Truitt Merle, MD.   I have reviewed the above documentation for accuracy and completeness, and I agree with the above.

## 2019-08-14 ENCOUNTER — Other Ambulatory Visit: Payer: Self-pay | Admitting: Nurse Practitioner

## 2019-08-14 ENCOUNTER — Telehealth: Payer: Self-pay

## 2019-08-14 MED ORDER — MORPHINE SULFATE ER 15 MG PO TBCR: 15.0000 mg | EXTENDED_RELEASE_TABLET | Freq: Two times a day (BID) | ORAL | 0 refills | Status: DC | PRN

## 2019-08-14 NOTE — Telephone Encounter (Signed)
Left message for patient regarding phone call received from his wife Billy Mendoza regarding his pain.  I asked him to call back and let me know if he needs to be seen sooner than Wednesday morning appointment already scheduled.

## 2019-08-14 NOTE — Telephone Encounter (Signed)
Spoke with patient concerning pain management, he has questions at this point whether the cancer has grown and/or could the stent be removed.  He feels he can wait until his appointment on Wednesday 11/11 to see Dr. Burr Medico to discuss.

## 2019-08-16 ENCOUNTER — Inpatient Hospital Stay (HOSPITAL_BASED_OUTPATIENT_CLINIC_OR_DEPARTMENT_OTHER): Payer: BC Managed Care – PPO | Admitting: Hematology

## 2019-08-16 ENCOUNTER — Inpatient Hospital Stay: Payer: BC Managed Care – PPO

## 2019-08-16 ENCOUNTER — Other Ambulatory Visit: Payer: Self-pay

## 2019-08-16 ENCOUNTER — Encounter: Payer: Self-pay | Admitting: Hematology

## 2019-08-16 VITALS — BP 126/90 | HR 67 | Temp 97.3°F | Resp 18

## 2019-08-16 VITALS — BP 110/81 | HR 113 | Resp 16 | Wt 215.9 lb

## 2019-08-16 DIAGNOSIS — D500 Iron deficiency anemia secondary to blood loss (chronic): Secondary | ICD-10-CM

## 2019-08-16 DIAGNOSIS — C20 Malignant neoplasm of rectum: Secondary | ICD-10-CM

## 2019-08-16 DIAGNOSIS — Z5112 Encounter for antineoplastic immunotherapy: Secondary | ICD-10-CM | POA: Diagnosis not present

## 2019-08-16 DIAGNOSIS — Z95828 Presence of other vascular implants and grafts: Secondary | ICD-10-CM

## 2019-08-16 DIAGNOSIS — C787 Secondary malignant neoplasm of liver and intrahepatic bile duct: Secondary | ICD-10-CM

## 2019-08-16 DIAGNOSIS — Z7189 Other specified counseling: Secondary | ICD-10-CM

## 2019-08-16 LAB — CBC WITH DIFFERENTIAL (CANCER CENTER ONLY)
Abs Immature Granulocytes: 0.19 10*3/uL — ABNORMAL HIGH (ref 0.00–0.07)
Basophils Absolute: 0.1 10*3/uL (ref 0.0–0.1)
Basophils Relative: 1 %
Eosinophils Absolute: 0.3 10*3/uL (ref 0.0–0.5)
Eosinophils Relative: 3 %
HCT: 24.5 % — ABNORMAL LOW (ref 39.0–52.0)
Hemoglobin: 7.4 g/dL — ABNORMAL LOW (ref 13.0–17.0)
Immature Granulocytes: 2 %
Lymphocytes Relative: 11 %
Lymphs Abs: 1.4 10*3/uL (ref 0.7–4.0)
MCH: 29.8 pg (ref 26.0–34.0)
MCHC: 30.2 g/dL (ref 30.0–36.0)
MCV: 98.8 fL (ref 80.0–100.0)
Monocytes Absolute: 0.8 10*3/uL (ref 0.1–1.0)
Monocytes Relative: 7 %
Neutro Abs: 9.3 10*3/uL — ABNORMAL HIGH (ref 1.7–7.7)
Neutrophils Relative %: 76 %
Platelet Count: 212 10*3/uL (ref 150–400)
RBC: 2.48 MIL/uL — ABNORMAL LOW (ref 4.22–5.81)
RDW: 17.5 % — ABNORMAL HIGH (ref 11.5–15.5)
WBC Count: 12.1 10*3/uL — ABNORMAL HIGH (ref 4.0–10.5)
nRBC: 0.2 % (ref 0.0–0.2)

## 2019-08-16 LAB — CMP (CANCER CENTER ONLY)
ALT: 18 U/L (ref 0–44)
AST: 17 U/L (ref 15–41)
Albumin: 2.9 g/dL — ABNORMAL LOW (ref 3.5–5.0)
Alkaline Phosphatase: 411 U/L — ABNORMAL HIGH (ref 38–126)
Anion gap: 12 (ref 5–15)
BUN: 15 mg/dL (ref 8–23)
CO2: 19 mmol/L — ABNORMAL LOW (ref 22–32)
Calcium: 8.7 mg/dL — ABNORMAL LOW (ref 8.9–10.3)
Chloride: 109 mmol/L (ref 98–111)
Creatinine: 0.96 mg/dL (ref 0.61–1.24)
GFR, Est AFR Am: >60 mL/min (ref >60)
GFR, Estimated: >60 mL/min (ref >60)
Glucose, Bld: 109 mg/dL — ABNORMAL HIGH (ref 70–99)
Potassium: 4.1 mmol/L (ref 3.5–5.1)
Sodium: 140 mmol/L (ref 135–145)
Total Bilirubin: <0.2 mg/dL — ABNORMAL LOW (ref 0.3–1.2)
Total Protein: 6.7 g/dL (ref 6.5–8.1)

## 2019-08-16 LAB — TOTAL PROTEIN, URINE DIPSTICK

## 2019-08-16 LAB — PREPARE RBC (CROSSMATCH)

## 2019-08-16 MED ORDER — SODIUM CHLORIDE 0.9% FLUSH
10.0000 mL | INTRAVENOUS | Status: DC | PRN
  Filled 2019-08-16: qty 10

## 2019-08-16 MED ORDER — SODIUM CHLORIDE 0.9 % IV SOLN
400.0000 mg/m2 | Freq: Once | INTRAVENOUS | Status: AC
  Administered 2019-08-16: 920 mg via INTRAVENOUS
  Filled 2019-08-16: qty 46

## 2019-08-16 MED ORDER — SODIUM CHLORIDE 0.9% IV SOLUTION
250.0000 mL | Freq: Once | INTRAVENOUS | Status: AC
  Administered 2019-08-16: 250 mL via INTRAVENOUS
  Filled 2019-08-16: qty 250

## 2019-08-16 MED ORDER — SODIUM CHLORIDE 0.9 % IV SOLN
165.0000 mg/m2 | Freq: Once | INTRAVENOUS | Status: AC
  Administered 2019-08-16: 380 mg via INTRAVENOUS
  Filled 2019-08-16: qty 19

## 2019-08-16 MED ORDER — SODIUM CHLORIDE 0.9 % IV SOLN
4.9000 mg/kg | Freq: Once | INTRAVENOUS | Status: AC
  Administered 2019-08-16: 500 mg via INTRAVENOUS
  Filled 2019-08-16: qty 4

## 2019-08-16 MED ORDER — DEXTROSE 5 % IV SOLN
Freq: Once | INTRAVENOUS | Status: AC
  Administered 2019-08-16: 10:00:00 via INTRAVENOUS
  Filled 2019-08-16: qty 250

## 2019-08-16 MED ORDER — DIPHENHYDRAMINE HCL 25 MG PO CAPS
25.0000 mg | ORAL_CAPSULE | Freq: Once | ORAL | Status: AC
  Administered 2019-08-16: 13:00:00 25 mg via ORAL

## 2019-08-16 MED ORDER — SODIUM CHLORIDE 0.9% FLUSH
10.0000 mL | INTRAVENOUS | Status: DC | PRN
  Administered 2019-08-16: 09:00:00 10 mL
  Filled 2019-08-16: qty 10

## 2019-08-16 MED ORDER — SODIUM CHLORIDE 0.9 % IV SOLN
Freq: Once | INTRAVENOUS | Status: AC
  Administered 2019-08-16: 10:00:00 via INTRAVENOUS
  Filled 2019-08-16: qty 5

## 2019-08-16 MED ORDER — PALONOSETRON HCL INJECTION 0.25 MG/5ML
0.2500 mg | Freq: Once | INTRAVENOUS | Status: AC
  Administered 2019-08-16: 0.25 mg via INTRAVENOUS

## 2019-08-16 MED ORDER — PALONOSETRON HCL INJECTION 0.25 MG/5ML
INTRAVENOUS | Status: AC
  Filled 2019-08-16: qty 5

## 2019-08-16 MED ORDER — ACETAMINOPHEN 325 MG PO TABS
ORAL_TABLET | ORAL | Status: AC
  Filled 2019-08-16: qty 2

## 2019-08-16 MED ORDER — SODIUM CHLORIDE 0.9 % IV SOLN
2400.0000 mg/m2 | INTRAVENOUS | Status: DC
  Administered 2019-08-16: 5500 mg via INTRAVENOUS
  Filled 2019-08-16: qty 110

## 2019-08-16 MED ORDER — OXYCODONE HCL 5 MG PO TABS: 5.0000 mg | ORAL_TABLET | Freq: Four times a day (QID) | ORAL | 0 refills | Status: DC | PRN

## 2019-08-16 MED ORDER — DIPHENHYDRAMINE HCL 25 MG PO CAPS
ORAL_CAPSULE | ORAL | Status: AC
  Filled 2019-08-16: qty 1

## 2019-08-16 MED ORDER — ATROPINE SULFATE 1 MG/ML IJ SOLN
INTRAMUSCULAR | Status: AC
  Filled 2019-08-16: qty 1

## 2019-08-16 MED ORDER — ACETAMINOPHEN 325 MG PO TABS
650.0000 mg | ORAL_TABLET | Freq: Once | ORAL | Status: AC
  Administered 2019-08-16: 650 mg via ORAL

## 2019-08-16 MED ORDER — ATROPINE SULFATE 1 MG/ML IJ SOLN
0.5000 mg | Freq: Once | INTRAMUSCULAR | Status: AC | PRN
  Administered 2019-08-16: 0.5 mg via INTRAVENOUS

## 2019-08-16 MED ORDER — RIVAROXABAN 15 MG PO TABS: 15.0000 mg | ORAL_TABLET | Freq: Two times a day (BID) | ORAL | 1 refills | Status: DC

## 2019-08-16 NOTE — Patient Instructions (Signed)
[facility] Discharge Instructions for Patients Receiving Chemotherapy  Today you received the following chemotherapy agents: Bevacizumab, Irinotecan, Leucovorin, 5FU  To help prevent nausea and vomiting after your treatment, we encourage you to take your nausea medication as directed.    If you develop nausea and vomiting that is not controlled by your nausea medication, call the clinic.   BELOW ARE SYMPTOMS THAT SHOULD BE REPORTED IMMEDIATELY:  *FEVER GREATER THAN 100.5 F  *CHILLS WITH OR WITHOUT FEVER  NAUSEA AND VOMITING THAT IS NOT CONTROLLED WITH YOUR NAUSEA MEDICATION  *UNUSUAL SHORTNESS OF BREATH  *UNUSUAL BRUISING OR BLEEDING  TENDERNESS IN MOUTH AND THROAT WITH OR WITHOUT PRESENCE OF ULCERS  *URINARY PROBLEMS  *BOWEL PROBLEMS  UNUSUAL RASH Items with * indicate a potential emergency and should be followed up as soon as possible.  Feel free to call the clinic should you have any questions or concerns. The clinic phone number is [phone].  Please show the [place] at check-in to the Emergency Department and triage nurse.   Blood Transfusion, Adult, Care After This sheet gives you information about how to care for yourself after your procedure. Your doctor may also give you more specific instructions. If you have problems or questions, contact your doctor. Follow these instructions at home:   Take over-the-counter and prescription medicines only as told by your doctor.  Go back to your normal activities as told by your doctor.  Follow instructions from your doctor about how to take care of the area where an IV tube was put into your vein (insertion site). Make sure you: ? Wash your hands with soap and water before you change your bandage (dressing). If there is no soap and water, use hand sanitizer. ? Change your bandage as told by your doctor.  Check your IV insertion site every day for signs of infection. Check for: ? More redness,  swelling, or pain. ? More fluid or blood. ? Warmth. ? Pus or a bad smell. Contact a doctor if:  You have more redness, swelling, or pain around the IV insertion site.  You have more fluid or blood coming from the IV insertion site.  Your IV insertion site feels warm to the touch.  You have pus or a bad smell coming from the IV insertion site.  Your pee (urine) turns pink, red, or brown.  You feel weak after doing your normal activities. Get help right away if:  You have signs of a serious allergic or body defense (immune) system reaction, including: ? Itchiness. ? Hives. ? Trouble breathing. ? Anxiety. ? Pain in your chest or lower back. ? Fever, flushing, and chills. ? Fast pulse. ? Rash. ? Watery poop (diarrhea). ? Throwing up (vomiting). ? Dark pee. ? Serious headache. ? Dizziness. ? Stiff neck. ? Yellow color in your face or the white parts of your eyes (jaundice). Summary  After a blood transfusion, return to your normal activities as told by your doctor.  Every day, check for signs of infection where the IV tube was put into your vein.  Some signs of infection are warm skin, more redness and pain, more fluid or blood, and pus or a bad smell where the needle went in.  Contact your doctor if you feel weak or have any unusual symptoms. This information is not intended to replace advice given to you by your health care provider. Make sure you discuss any questions you have with your health care provider. Document Released:  10/12/2014 Document Revised: 01/26/2018 Document Reviewed: 05/15/2016 Elsevier Patient Education  [place].

## 2019-08-16 NOTE — Patient Instructions (Signed)

## 2019-08-17 ENCOUNTER — Telehealth: Payer: Self-pay | Admitting: Hematology

## 2019-08-17 NOTE — Telephone Encounter (Signed)
Per 11/11 los CT CAP with contrast in 1-2 weeks

## 2019-08-18 ENCOUNTER — Other Ambulatory Visit: Payer: Self-pay

## 2019-08-18 ENCOUNTER — Inpatient Hospital Stay: Payer: BC Managed Care – PPO

## 2019-08-18 VITALS — BP 88/56 | HR 61 | Temp 98.6°F | Resp 18

## 2019-08-18 DIAGNOSIS — C20 Malignant neoplasm of rectum: Secondary | ICD-10-CM

## 2019-08-18 DIAGNOSIS — D649 Anemia, unspecified: Secondary | ICD-10-CM

## 2019-08-18 DIAGNOSIS — Z5112 Encounter for antineoplastic immunotherapy: Secondary | ICD-10-CM | POA: Diagnosis not present

## 2019-08-18 DIAGNOSIS — C787 Secondary malignant neoplasm of liver and intrahepatic bile duct: Secondary | ICD-10-CM

## 2019-08-18 DIAGNOSIS — Z95828 Presence of other vascular implants and grafts: Secondary | ICD-10-CM

## 2019-08-18 DIAGNOSIS — Z7189 Other specified counseling: Secondary | ICD-10-CM

## 2019-08-18 LAB — PREPARE RBC (CROSSMATCH)

## 2019-08-18 MED ORDER — DIPHENHYDRAMINE HCL 25 MG PO CAPS
25.0000 mg | ORAL_CAPSULE | Freq: Once | ORAL | Status: AC
  Administered 2019-08-18: 25 mg via ORAL

## 2019-08-18 MED ORDER — ACETAMINOPHEN 325 MG PO TABS
ORAL_TABLET | ORAL | Status: AC
  Filled 2019-08-18: qty 2

## 2019-08-18 MED ORDER — PEGFILGRASTIM-CBQV 6 MG/0.6ML ~~LOC~~ SOSY
6.0000 mg | PREFILLED_SYRINGE | Freq: Once | SUBCUTANEOUS | Status: AC
  Administered 2019-08-18: 6 mg via SUBCUTANEOUS

## 2019-08-18 MED ORDER — SODIUM CHLORIDE 0.9% FLUSH
10.0000 mL | INTRAVENOUS | Status: DC | PRN
  Administered 2019-08-18: 10 mL
  Filled 2019-08-18: qty 10

## 2019-08-18 MED ORDER — ACETAMINOPHEN 325 MG PO TABS
650.0000 mg | ORAL_TABLET | Freq: Once | ORAL | Status: AC
  Administered 2019-08-18: 650 mg via ORAL

## 2019-08-18 MED ORDER — HEPARIN SOD (PORK) LOCK FLUSH 100 UNIT/ML IV SOLN
500.0000 [IU] | Freq: Once | INTRAVENOUS | Status: AC | PRN
  Administered 2019-08-18: 500 [IU]
  Filled 2019-08-18: qty 5

## 2019-08-18 MED ORDER — SODIUM CHLORIDE 0.9% IV SOLUTION
250.0000 mL | Freq: Once | INTRAVENOUS | Status: AC
  Administered 2019-08-18: 250 mL via INTRAVENOUS
  Filled 2019-08-18: qty 250

## 2019-08-18 MED ORDER — DIPHENHYDRAMINE HCL 25 MG PO CAPS
ORAL_CAPSULE | ORAL | Status: AC
  Filled 2019-08-18: qty 1

## 2019-08-18 MED ORDER — PEGFILGRASTIM-CBQV 6 MG/0.6ML ~~LOC~~ SOSY
PREFILLED_SYRINGE | SUBCUTANEOUS | Status: AC
  Filled 2019-08-18: qty 0.6

## 2019-08-18 NOTE — Patient Instructions (Signed)
Transfusin de [place], [place] [facility], Adult, Care After [place] esta informacin sobre cmo cuidarse despus del procedimiento. El mdico tambin podr darle indicaciones ms especficas. Si tiene problemas o preguntas, llame al mdico. Siga estas indicaciones en su casa:   Tome los medicamentos de venta libre y los recetados solamente como se lo haya indicado el mdico.  Retome sus actividades habituales como se lo haya indicado el mdico.  Siga las indicaciones del mdico en lo que respecta al cuidado de la zona donde se insert el tubo (catter) intravenoso en la vena (lugar de la insercin). Haga lo siguiente: ? Lvese las manos con agua y jabn antes de Quarry manager las vendas (vendajes). Use un desinfectante para manos si no dispone de [place] y [place]. ? Cambie el vendaje como se lo haya indicado el mdico.  Psychiatric nurse de insercin de la va intravenosa (IV) todos los das para descartar signos de infeccin. Est atento a los siguientes signos: ? Aumento del enrojecimiento, de la hinchazn o del dolor. ? Mayor presencia de lquido o [place]. ? Calor. ? Pus o mal olor. Comunquese con un mdico si:  Aumenta el enrojecimiento, la hinchazn o Conservation officer, historic buildings alrededor del lugar de la insercin de va intravenosa (IV).  Aumenta la cantidad de lquido o de sangre que sale del Environmental consultant de la insercin.  El lugar de la insercin est caliente al tacto.  Tiene pus o percibe mal olor que proviene del lugar de la insercin.  Su pis (orina) se vuelve rosa, roja o [place].  Se siente dbil despus de realizar sus actividades habituales. Solicite ayuda de inmediato si:  Tiene signos de Nurse, mental health grave o una reaccin del sistema de defensa del organismo (inmunitario), por ejemplo: ? Picazn. ? Ronchas. ? Problemas para respirar. ? Ansiedad. ? Dolor en el pecho o en la parte inferior de la espalda. ? Cristy Hilts, sofocos o escalofros. ? Pulso acelerado. ? Erupcin  cutnea. ? Deposiciones lquidas (diarrea). ? Vmitos. ? La orina es [place]. ? Dolor de cabeza intenso. ? Mareos. ? Rigidez en el cuello. ? Coloracin amarilla en el rostro o en la parte blanca de los ojos (ictericia). Resumen  Despus de una transfusin de [place], retome sus actividades habituales como se lo haya indicado el mdico.  [employer] revise la zona donde se coloc el tubo (catter) Librarian, academic signos de infeccin.  Algunos signos de infeccin son piel caliente, ms enrojecimiento y Social research officer, government, ms lquido o sangre y pus o mal olor en el lugar donde se introdujo la aguja.  Comunquese con su mdico si se siente dbil o tiene sntomas inusuales. Esta informacin no tiene Marine scientist el consejo del mdico. Asegrese de hacerle al mdico cualquier pregunta que tenga. Document Released: 10/12/2014 Document Revised: 04/12/2017 Document Reviewed: 08/01/2014 Elsevier Patient Education  2020 [employer].

## 2019-08-20 LAB — TYPE AND SCREEN
ABO/RH(D): O POS
Antibody Screen: NEGATIVE
Unit division: 0
Unit division: 0

## 2019-08-20 LAB — BPAM RBC
Blood Product Expiration Date: 202012112359
Blood Product Expiration Date: 202012112359
ISSUE DATE / TIME: 202011111255
ISSUE DATE / TIME: 202011131337
Unit Type and Rh: 5100
Unit Type and Rh: 5100

## 2019-08-23 ENCOUNTER — Other Ambulatory Visit: Payer: Self-pay

## 2019-08-23 ENCOUNTER — Ambulatory Visit (HOSPITAL_COMMUNITY): Payer: BC Managed Care – PPO

## 2019-08-23 ENCOUNTER — Other Ambulatory Visit: Payer: Self-pay | Admitting: Emergency Medicine

## 2019-08-23 ENCOUNTER — Inpatient Hospital Stay: Payer: BC Managed Care – PPO

## 2019-08-23 ENCOUNTER — Telehealth: Payer: Self-pay

## 2019-08-23 ENCOUNTER — Other Ambulatory Visit: Payer: BC Managed Care – PPO

## 2019-08-23 ENCOUNTER — Inpatient Hospital Stay (HOSPITAL_BASED_OUTPATIENT_CLINIC_OR_DEPARTMENT_OTHER): Payer: BC Managed Care – PPO | Admitting: Medical

## 2019-08-23 VITALS — BP 119/73 | HR 77 | Temp 98.3°F | Resp 17 | Ht 73.0 in

## 2019-08-23 DIAGNOSIS — I951 Orthostatic hypotension: Secondary | ICD-10-CM

## 2019-08-23 DIAGNOSIS — R531 Weakness: Secondary | ICD-10-CM

## 2019-08-23 DIAGNOSIS — C787 Secondary malignant neoplasm of liver and intrahepatic bile duct: Secondary | ICD-10-CM

## 2019-08-23 DIAGNOSIS — D649 Anemia, unspecified: Secondary | ICD-10-CM

## 2019-08-23 DIAGNOSIS — K625 Hemorrhage of anus and rectum: Secondary | ICD-10-CM

## 2019-08-23 DIAGNOSIS — C20 Malignant neoplasm of rectum: Secondary | ICD-10-CM

## 2019-08-23 DIAGNOSIS — Z5112 Encounter for antineoplastic immunotherapy: Secondary | ICD-10-CM | POA: Diagnosis not present

## 2019-08-23 LAB — CMP (CANCER CENTER ONLY)
ALT: 31 U/L (ref 0–44)
AST: 22 U/L (ref 15–41)
Albumin: 2.8 g/dL — ABNORMAL LOW (ref 3.5–5.0)
Alkaline Phosphatase: 375 U/L — ABNORMAL HIGH (ref 38–126)
Anion gap: 11 (ref 5–15)
BUN: 18 mg/dL (ref 8–23)
CO2: 19 mmol/L — ABNORMAL LOW (ref 22–32)
Calcium: 8.1 mg/dL — ABNORMAL LOW (ref 8.9–10.3)
Chloride: 108 mmol/L (ref 98–111)
Creatinine: 0.86 mg/dL (ref 0.61–1.24)
GFR, Est AFR Am: >60 mL/min (ref >60)
GFR, Estimated: >60 mL/min (ref >60)
Glucose, Bld: 114 mg/dL — ABNORMAL HIGH (ref 70–99)
Potassium: 4.2 mmol/L (ref 3.5–5.1)
Sodium: 138 mmol/L (ref 135–145)
Total Bilirubin: 0.3 mg/dL (ref 0.3–1.2)
Total Protein: 6.1 g/dL — ABNORMAL LOW (ref 6.5–8.1)

## 2019-08-23 LAB — CBC WITH DIFFERENTIAL (CANCER CENTER ONLY)
Abs Immature Granulocytes: 0.12 10*3/uL — ABNORMAL HIGH (ref 0.00–0.07)
Basophils Absolute: 0.1 10*3/uL (ref 0.0–0.1)
Basophils Relative: 1 %
Eosinophils Absolute: 0.3 10*3/uL (ref 0.0–0.5)
Eosinophils Relative: 3 %
HCT: 21.6 % — ABNORMAL LOW (ref 39.0–52.0)
Hemoglobin: 6.6 g/dL — CL (ref 13.0–17.0)
Immature Granulocytes: 1 %
Lymphocytes Relative: 8 %
Lymphs Abs: 0.9 10*3/uL (ref 0.7–4.0)
MCH: 30.1 pg (ref 26.0–34.0)
MCHC: 30.6 g/dL (ref 30.0–36.0)
MCV: 98.6 fL (ref 80.0–100.0)
Monocytes Absolute: 0.8 10*3/uL (ref 0.1–1.0)
Monocytes Relative: 8 %
Neutro Abs: 8.6 10*3/uL — ABNORMAL HIGH (ref 1.7–7.7)
Neutrophils Relative %: 79 %
Platelet Count: 90 10*3/uL — ABNORMAL LOW (ref 150–400)
RBC: 2.19 MIL/uL — ABNORMAL LOW (ref 4.22–5.81)
RDW: 16.7 % — ABNORMAL HIGH (ref 11.5–15.5)
WBC Count: 10.8 10*3/uL — ABNORMAL HIGH (ref 4.0–10.5)
nRBC: 0.2 % (ref 0.0–0.2)

## 2019-08-23 LAB — SAMPLE TO BLOOD BANK

## 2019-08-23 LAB — PREPARE RBC (CROSSMATCH)

## 2019-08-23 MED ORDER — DIPHENHYDRAMINE HCL 25 MG PO CAPS
25.0000 mg | ORAL_CAPSULE | Freq: Once | ORAL | Status: AC
  Administered 2019-08-23: 25 mg via ORAL

## 2019-08-23 MED ORDER — HEPARIN SOD (PORK) LOCK FLUSH 100 UNIT/ML IV SOLN
500.0000 [IU] | Freq: Every day | INTRAVENOUS | Status: AC | PRN
  Administered 2019-08-23: 500 [IU]
  Filled 2019-08-23: qty 5

## 2019-08-23 MED ORDER — ACETAMINOPHEN 325 MG PO TABS
650.0000 mg | ORAL_TABLET | Freq: Once | ORAL | Status: AC
  Administered 2019-08-23: 650 mg via ORAL

## 2019-08-23 MED ORDER — SODIUM CHLORIDE 0.9% FLUSH
10.0000 mL | INTRAVENOUS | Status: AC | PRN
  Administered 2019-08-23: 10 mL
  Filled 2019-08-23: qty 10

## 2019-08-23 MED ORDER — ACETAMINOPHEN 325 MG PO TABS
ORAL_TABLET | ORAL | Status: AC
  Filled 2019-08-23: qty 2

## 2019-08-23 MED ORDER — SODIUM CHLORIDE 0.9 % IV SOLN
INTRAVENOUS | Status: DC
  Administered 2019-08-23: 12:00:00 via INTRAVENOUS
  Filled 2019-08-23 (×2): qty 250

## 2019-08-23 MED ORDER — DIPHENHYDRAMINE HCL 25 MG PO CAPS
ORAL_CAPSULE | ORAL | Status: AC
  Filled 2019-08-23: qty 1

## 2019-08-23 MED ORDER — HEPARIN SOD (PORK) LOCK FLUSH 100 UNIT/ML IV SOLN
250.0000 [IU] | INTRAVENOUS | Status: DC | PRN
  Filled 2019-08-23: qty 5

## 2019-08-23 NOTE — Patient Instructions (Signed)
Blood Transfusion, Adult, Care After This sheet gives you information about how to care for yourself after your procedure. Your doctor may also give you more specific instructions. If you have problems or questions, contact your doctor. Follow these instructions at home:   Take over-the-counter and prescription medicines only as told by your doctor.  Go back to your normal activities as told by your doctor.  Follow instructions from your doctor about how to take care of the area where an IV tube was put into your vein (insertion site). Make sure you: ? Wash your hands with soap and water before you change your bandage (dressing). If there is no soap and water, use hand sanitizer. ? Change your bandage as told by your doctor.  Check your IV insertion site every day for signs of infection. Check for: ? More redness, swelling, or pain. ? More fluid or blood. ? Warmth. ? Pus or a bad smell. Contact a doctor if:  You have more redness, swelling, or pain around the IV insertion site.  You have more fluid or blood coming from the IV insertion site.  Your IV insertion site feels warm to the touch.  You have pus or a bad smell coming from the IV insertion site.  Your pee (urine) turns pink, red, or brown.  You feel weak after doing your normal activities. Get help right away if:  You have signs of a serious allergic or body defense (immune) system reaction, including: ? Itchiness. ? Hives. ? Trouble breathing. ? Anxiety. ? Pain in your chest or lower back. ? Fever, flushing, and chills. ? Fast pulse. ? Rash. ? Watery poop (diarrhea). ? Throwing up (vomiting). ? Dark pee. ? Serious headache. ? Dizziness. ? Stiff neck. ? Yellow color in your face or the white parts of your eyes (jaundice). Summary  After a blood transfusion, return to your normal activities as told by your doctor.  Every day, check for signs of infection where the IV tube was put into your vein.  Some  signs of infection are warm skin, more redness and pain, more fluid or blood, and pus or a bad smell where the needle went in.  Contact your doctor if you feel weak or have any unusual symptoms. This information is not intended to replace advice given to you by your health care provider. Make sure you discuss any questions you have with your health care provider. Document Released: 10/12/2014 Document Revised: 01/26/2018 Document Reviewed: 05/15/2016 Elsevier Patient Education  2020 Elsevier Inc.  

## 2019-08-23 NOTE — Progress Notes (Signed)
Pt's CT scan for today (08/23/2019) rescheduled, NPO status discontinued.  Report given in person to [facility], pt transferred with belongings to infusion suite section G to finish rest of 2 unit PRBCs.  Tolerated first unit well, ate and drank during without any issues, VSS.

## 2019-08-23 NOTE — Progress Notes (Signed)
[facility] Progress Note   Billy Mendoza [id number] 16-Jun-1951 68 y.o.  Billy Mendoza is managed by [name]  Actively treated with chemotherapy/immunotherapy/hormonal therapy: yes  Current therapy: FOLFIRI with plans to add Avastin with his next chemotherapy cycle.  Last treated: 08/16/2019 (cycle #5)  Next scheduled appointment with provider: 09/07/2019  Assessment: Plan:    Rectal cancer metastasized to liver ([place])  Weak  Rectal bleeding   Metastatic rectal cancer with liver metastasis: The patient is status post cycle 5 of FOLFIRI which was dosed on 08/16/2019.    Is scheduled to have a CT scan completed today.  This has been changed to 08/30/2019 at 8:30 AM.  History of iron deficiency anemia due to chronic blood loss with increasing fatigue and weakness: Labs were collected today with the patient CBC returning showing a hemoglobin of 6.6 today.  He will be transfused with 2 units of packed red blood cells today.  Please see After Visit Summary for patient specific instructions.  Future Appointments  Date Time Provider [place]  08/23/2019  2:00 PM [facility] 2 [facility] [place]  09/07/2019  9:30 AM [facility] LAB ONLY [facility] None  09/07/2019  9:45 AM [facility] [place] FLUSH [facility] None  09/07/2019 10:20 AM Truitt Merle, MD [facility] None  09/07/2019 11:30 AM [facility] INFUSION [facility] None  09/09/2019 10:00 AM [facility] [place] FLUSH [facility] None  09/21/2019 10:00 AM [facility] LAB 3 [facility] None  09/21/2019 10:15 AM [facility] [place] FLUSH [facility] None  09/21/2019 10:40 AM Truitt Merle, MD [facility] None  09/21/2019 11:30 AM [facility] INFUSION [facility] None  09/23/2019 10:00 AM [facility] [place] FLUSH [facility] None    No orders of the defined types were placed in this encounter.      Subjective:   Patient ID:  Billy Mendoza is a 68 y.o. (DOB [birth year]) male.  Chief Complaint: No chief complaint on file.   HPI  Billy Mendoza  is a 68 y.o. male with a diagnosis of a metastatic rectal cancer with liver metastasis.  Billy Mendoza is managed by [name] and is status post cycle 5 of FOLFIRI which was dosed on 08/16/2019.  He has anemia due to chronic blood loss.  He has had episodes of bright red blood per rectum which has been thought to be secondary to a colonic stent and Xarelto 15 mg once daily which he takes for a history of pulmonary emboli.  He noted bright red blood per rectum on Monday Tuesday with diarrhea.  Last evening of this worsened.  He presents to the office today with fatigue, decreased endurance, and positional dizziness.  He has not noted bright red blood per rectum today.  Medications: I have reviewed the patient's current medications.  Allergies: No Known Allergies  Past Medical History:  Diagnosis Date  . colorectal ca dx'd 07/2018  . Liver metastases ([place]) dx'd 07/2018  . Lung metastases ([place]) dx'd 07/2018    Past Surgical History:  Procedure Laterality Date  . COLONIC STENT PLACEMENT N/A 01/19/2019   Procedure: COLONIC STENT PLACEMENT;  Surgeon: Carol Ada, MD;  [facility];  Service: Endoscopy;  Laterality: N/A;  . FLEXIBLE SIGMOIDOSCOPY N/A 01/19/2019   Procedure: FLEXIBLE SIGMOIDOSCOPY;  Surgeon: Carol Ada, MD;  Location[facility];  Service: Endoscopy;  Laterality: N/A;  . IR IMAGING GUIDED PORT INSERTION  08/16/2018  . IR US GUIDE BX ASP/DRAIN  08/16/2018    Family History  Problem Relation Age of Onset  . Cancer Mother 43  breast cancer  . Atrial fibrillation Father   . Heart failure Father   . Cancer Maternal Grandmother        lung cancer     Social History   Socioeconomic History  . Marital status: Married    Spouse name: Not on file  . Number of children: Not on file  . Years of education: Not on file  . Highest education level: Not on file  Occupational History  . Not on file  Social Needs  . Financial resource strain: Not  on file  . Food insecurity    Worry: Not on file    Inability: Not on file  . Transportation needs    Medical: No    Non-medical: No  Tobacco Use  . Smoking status: Former Smoker    Years: 10.00    Types: Cigarettes, Cigars  . Smokeless tobacco: Never Used  . Tobacco comment: quit in Sep 2019   Substance and Sexual Activity  . Alcohol use: Yes    Alcohol/week: 1.0 - 2.0 standard drinks    Types: 1 - 2 Shots of liquor per week  . Drug use: Never  . Sexual activity: Not on file  Lifestyle  . Physical activity    Days per week: Not on file    Minutes per session: Not on file  . Stress: Not on file  Relationships  . Social Herbalist on phone: Not on file    Gets together: Not on file    Attends religious service: Not on file    Active member of club or organization: Not on file    Attends meetings of clubs or organizations: Not on file    Relationship status: Not on file  . Intimate partner violence    Fear of current or ex partner: Not on file    Emotionally abused: Not on file    Physically abused: Not on file    Forced sexual activity: Not on file  Other Topics Concern  . Not on file  Social History Narrative  . Not on file    Past Medical History, Surgical history, Social history, and Family history were reviewed and updated as appropriate.   Please see review of systems for further details on the patient's review from today.   Review of Systems:  Review of Systems  Constitutional: Positive for fatigue. Negative for appetite change, chills, diaphoresis and fever.  HENT: Negative for trouble swallowing.   Respiratory: Negative for cough, chest tightness and shortness of breath.   Cardiovascular: Negative for chest pain, palpitations and leg swelling.  Gastrointestinal: Positive for anal bleeding, blood in stool and diarrhea. Negative for abdominal distention, abdominal pain, constipation, nausea and vomiting.  Genitourinary: Negative for decreased urine  volume and difficulty urinating.  Skin: Positive for pallor.  Neurological: Positive for dizziness and weakness.    Objective:   Physical Exam:  There were no vitals taken for this visit. ECOG: 1  Physical Exam Constitutional:      General: He is not in acute distress.    Appearance: He is not diaphoretic.  HENT:     Head: Normocephalic and atraumatic.  Eyes:     Comments: Conjunctiva is pale  Cardiovascular:     Rate and Rhythm: Regular rhythm. Tachycardia present.     Heart sounds: Normal heart sounds. No murmur. No friction rub. No gallop.   Pulmonary:     Effort: Pulmonary effort is normal. No respiratory distress.  Breath sounds: Normal breath sounds. No wheezing or rales.  Abdominal:     General: Bowel sounds are normal. There is no distension.     Palpations: Abdomen is soft. There is no mass.     Tenderness: There is no abdominal tenderness. There is no guarding or rebound.  Skin:    General: Skin is warm and dry.     Capillary Refill: Capillary refill takes more than 3 seconds.     Coloration: Skin is pale.  Neurological:     Mental Status: He is alert.     Lab Review:     Component Value Date/Time   NA 140 08/16/2019 0855   K 4.1 08/16/2019 0855   CL 109 08/16/2019 0855   CO2 19 (L) 08/16/2019 0855   GLUCOSE 109 (H) 08/16/2019 0855   BUN 15 08/16/2019 0855   CREATININE 0.96 08/16/2019 0855   CALCIUM 8.7 (L) 08/16/2019 0855   PROT 6.7 08/16/2019 0855   ALBUMIN 2.9 (L) 08/16/2019 0855   AST 17 08/16/2019 0855   ALT 18 08/16/2019 0855   ALKPHOS 411 (H) 08/16/2019 0855   BILITOT <0.2 (L) 08/16/2019 0855   GFRNONAA >60 08/16/2019 0855   GFRAA >60 08/16/2019 0855       Component Value Date/Time   WBC 12.1 (H) 08/16/2019 0855   WBC 5.6 06/02/2019 0337   RBC 2.48 (L) 08/16/2019 0855   HGB 7.4 (L) 08/16/2019 0855   HCT 24.5 (L) 08/16/2019 0855   PLT 212 08/16/2019 0855   MCV 98.8 08/16/2019 0855   MCH 29.8 08/16/2019 0855   MCHC 30.2 08/16/2019  0855   RDW 17.5 (H) 08/16/2019 0855   LYMPHSABS 1.4 08/16/2019 0855   MONOABS 0.8 08/16/2019 0855   EOSABS 0.3 08/16/2019 0855   BASOSABS 0.1 08/16/2019 0855   -------------------------------  Imaging from last 24 hours (if applicable):  Radiology interpretation: No results found.       Addendum  I have seen the patient, examined him. I agree with the assessment and and plan and have edited the notes.   I spoke with pt and his wife, due to his significant rectal bleeding, well hold Xarelto for today, if he still has bloody bowel movement more than 4 5 times tomorrow, then will hold for another day. Will not hold Xarelto for more than 3 days due to his high risk of DVT/PE.  He will restart after his rectal bleeding significantly improves, and okay to decrease to 10 mg daily if he has persistent bloody bowel movement more than twice daily. His wife is a Marine scientist and will help him to manage his medication.   I will also touch base with his GI Dr. Benson Norway to see if he can offer colonoscopy for his rectal bleeding and check his stent condition.   Will reschedule his CT to next week, he will get blood transfusion today.  We discussed the option of next line treatment with his scan shows disease progression.  Unfortunately, BRAF mutated metastatic colon cancer is more aggressive, and difficult to treat.  I discussed the FDA approved single agent regorafenib and Lonsurf, but clinical response rate is low. I discussed the option of regorafenib and Nivolumab based on the REGONIVO phase 1 data, he is interested.   We also discussed code status, I recommend DNR, he will think about it.  I also encouraged him to have a living well and healthcare power attorney.  All questions were answered. I will see him after his restaging scan.  Truitt Merle MD  08/23/2019

## 2019-08-23 NOTE — Telephone Encounter (Signed)
Patient calls with increased rectal bleeding x 2 days, frequent bowel movements during the night, feels weak, has CT scan scheduled for 2:00 today but unsure if he can go, however wants to come in for blood.  I spoke with Dr. Burr Medico, will have him come into [facility] today, encouraged him to keep his appointment for CT scan today.  He will come on in to [facility] have labs/PF/see Sandi Mealy PA and potential get IVF/Blood prior to CT scan. Scheduling message was sent.

## 2019-08-24 LAB — BPAM RBC
Blood Product Expiration Date: 202012172359
Blood Product Expiration Date: 202012172359
ISSUE DATE / TIME: 202011181317
ISSUE DATE / TIME: 202011181317
Unit Type and Rh: 5100
Unit Type and Rh: 5100

## 2019-08-24 LAB — TYPE AND SCREEN
ABO/RH(D): O POS
Antibody Screen: NEGATIVE
Unit division: 0
Unit division: 0

## 2019-08-26 LAB — MAGNESIUM, RBC: Magnesium RBC: 5.3 mg/dL (ref 4.2–6.8)

## 2019-08-30 ENCOUNTER — Other Ambulatory Visit: Payer: Self-pay

## 2019-08-30 ENCOUNTER — Encounter: Payer: Self-pay | Admitting: Medical

## 2019-08-30 ENCOUNTER — Telehealth: Payer: Self-pay | Admitting: *Deleted

## 2019-08-30 ENCOUNTER — Ambulatory Visit (HOSPITAL_COMMUNITY)
Admission: RE | Admit: 2019-08-30 | Discharge: 2019-08-30 | Disposition: A | Discharge status: Home / Self Care | Payer: BC Managed Care – PPO | Source: Ambulatory Visit | Attending: Hematology | Admitting: Hematology

## 2019-08-30 ENCOUNTER — Encounter (HOSPITAL_COMMUNITY): Payer: Self-pay

## 2019-08-30 DIAGNOSIS — C787 Secondary malignant neoplasm of liver and intrahepatic bile duct: Secondary | ICD-10-CM | POA: Insufficient documentation

## 2019-08-30 DIAGNOSIS — C20 Malignant neoplasm of rectum: Secondary | ICD-10-CM | POA: Diagnosis present

## 2019-08-30 MED ORDER — SODIUM CHLORIDE (PF) 0.9 % IJ SOLN
INTRAMUSCULAR | Status: AC
  Filled 2019-08-30: qty 50

## 2019-08-30 MED ORDER — IOHEXOL 300 MG/ML  SOLN
100.0000 mL | Freq: Once | INTRAMUSCULAR | Status: AC | PRN
  Administered 2019-08-30: 100 mL via INTRAVENOUS

## 2019-08-30 NOTE — Progress Notes (Signed)
Received call from patient having difficulty trying to break the Xarelto 20 mg tabs in half.  I called into Xarelto 10 mg take on daily #30 with 1 refill to [facility] on file.  I called the patient to let him know. He verbalized an understanding

## 2019-08-30 NOTE — Telephone Encounter (Signed)
Dr. Burr Medico has reduced Xarelto to 10 mg. Has bottle of 20 tabs and she is trying to break in half without success. Asking if there is a 20 mg tablet that is scored or can MD call in 10 mg tablets? Forwarded message to collaborative nurse.

## 2019-09-04 ENCOUNTER — Inpatient Hospital Stay (HOSPITAL_BASED_OUTPATIENT_CLINIC_OR_DEPARTMENT_OTHER): Payer: BC Managed Care – PPO | Admitting: Hematology

## 2019-09-04 ENCOUNTER — Telehealth: Payer: Self-pay

## 2019-09-04 DIAGNOSIS — C787 Secondary malignant neoplasm of liver and intrahepatic bile duct: Secondary | ICD-10-CM

## 2019-09-04 DIAGNOSIS — C20 Malignant neoplasm of rectum: Secondary | ICD-10-CM

## 2019-09-04 MED ORDER — REGORAFENIB 40 MG PO TABS: 80.0000 mg | ORAL_TABLET | Freq: Every day | ORAL | 0 refills | Status: DC

## 2019-09-04 NOTE — Telephone Encounter (Signed)
Patient's wife calls for refill on Oxycodone last filled on 11/11 #90 send into [facility] on file.

## 2019-09-04 NOTE — Progress Notes (Signed)
[place]   [phone] Fax:([phone]) [phone]   Clinic Follow up Note   Patient Care Team: Merry Proud, MD as PCP - General (Internal Medicine) Truitt Merle, MD as Consulting Physician (Hematology) Carol Ada, MD as Consulting Physician (Gastroenterology)   I connected with [name] on 09/04/2019 at  2:40 PM EST by video enabled telemedicine visit and verified that I am speaking with the correct person using two identifiers.  I discussed the limitations, risks, security and privacy concerns of performing an evaluation and management service by telephone and the availability of in person appointments. I also discussed with the patient that there may be a patient responsible charge related to this service. The patient expressed understanding and agreed to proceed.   Patient's location:  His home  Provider's location:  My Office  CHIEF COMPLAINT:  F/u of rectal cancer metastasized to liver  SUMMARY OF ONCOLOGIC HISTORY: Oncology History Overview Note  Cancer Staging Rectal cancer metastasized to liver [facility]) Staging form: Colon and Rectum, AJCC 8th Edition - Clinical stage from 07/28/2018: Stage IVB (cTX, cNX, pM1b) - Signed by Truitt Merle, MD on 08/18/2018     Rectal cancer metastasized to liver ([place])  07/28/2018 Initial Biopsy   Biopsy 07/28/18 Final Microscopic Diagnosis A.Colon, Ascending, Polyp:  -Fragments of tubular adenoma -No high-grade dysplasia of malignancy.  B. Rectum, Mass, Polyp:  -Invasive adenocarcinoma, moderately - poorly differentiatred.    07/28/2018 Cancer Staging   Staging form: Colon and Rectum, AJCC 8th Edition - Clinical stage from 07/28/2018: Stage IVB (cTX, cNX, pM1b) - Signed by Truitt Merle, MD on 08/18/2018   08/04/2018 Initial Diagnosis   Rectal cancer metastasized to liver ([place])   08/16/2018 Pathology Results   Biospy  Diagnosis 08/16/18 Liver, needle/core biopsy - ADENOCARCINOMA. Microscopic Comment By  morphology, the findings are compatible with the provided clinical history of colorectal adenocarcinoma. Results reported to [name] on 08/18/2018. Intradepartmental consultation was obtained ([name]).    08/16/2018 Miscellaneous   Foundation 1 genomic testing: MS-stable Tumor mutation burden  3/Mb K-ras/NRAS wild-type BRAF V600 E KDMSA R266Q PTEN T368f* SMAD4 loss TP53 loss     08/18/2018 Procedure   Colonoscopy by Dr. HBenson Norwayshowed malignant appearing partially obstructing tumor in the rectum, 8 cm above anal verge.   08/18/2018 - 09/07/2018 Chemotherapy   CAPOX q3weeks with Xeloda 20062mBID 2 weeks on, 1 weeks off starting 08/18/18.  Swithced treatment due to BRAF mutation.    09/07/2018 - 12/24/2018 Chemotherapy   FOLFOXIRI and avastin every 2 weeks starting 09/07/18. Oxaliplatin held starting with cycle 8 and continue with FOLFIRI and Avastin. D/c due to disease progression.    11/04/2018 Imaging   CT CAP 11/04/18 IMPRESSION: 1. Peripheral filling defect in the right main pulmonary artery extending primarily into the right lower lobe pulmonary artery. While the peripheral location indicates some degree of chronicity of this pulmonary embolus, this is a new finding compared to 07/28/2018. The right ventricular to left ventricular ratio is 0.7, which does not specifically indicate right heart strain. 2. The malignancy appears considerably improved, with reduced size pulmonary nodules; reduced size and increased internal necrosis of the hepatic metastatic lesions; marked improvement in the thoracic adenopathy; reduced conspicuity of the rectal tumor; and significant reduction in perirectal tumor burden/adenopathy. 3. Other imaging findings of potential clinical significance: Aortic Atherosclerosis (ICD10-I70.0). Coronary atherosclerosis. Bilateral renal cysts. Small umbilical hernia contains adipose tissue.  Critical Value/emergent results were called by telephone at  the time of interpretation  on 11/04/2018 at 3:05 pm to [name] , who verbally acknowledged these results.   01/18/2019 Imaging   CT AP W contrast  IMPRESSION: 1. Colonic obstruction resulting from rectal carcinoma. 2. Hepatic metastatic disease which is decreased in size compared with the prior examination of 11/04/2018. 3. Multiple right middle lobe and right lower lobe pulmonary nodules most consistent with metastatic disease.   02/01/2019 - 06/01/2019 Chemotherapy   second line BRAFTOVI(encorafenib) 364m daily and MEKTOVI(binimetinib), 416mq12h and Vectibix q2weeks starting 02/01/19.  -[facility]  -Stopped on 06/01/19 due to disease progression    02/13/2019 Imaging   CT Chest  IMPRESSION: Small bilateral pulmonary metastases, mildly progressive from January 2020.  Multifocal hepatic metastases, incompletely visualized, favored to be progressive from April 2020.  Aortic Atherosclerosis (ICD10-I70.0).    02/22/2019 - 03/21/2019 Radiation Therapy   -Palliative local radiation  -Holding mektovi (binimetinib) during RT -Due to significant rectal pain, diarrhea and stool incontinence, he stopped RT after 03/13/19. He was only able to finish 1/2 remianing treatments on 03/21/19.    05/31/2019 Imaging   CT CAP W contrast 05/31/19  IMPRESSION: 1. Pulmonary emboli with a saddle embolus. Critical Value/emergent results were called by telephone at the time of interpretation on 05/31/2019 at 12:03 pm to [name] , who verbally acknowledged these results. 2. Slight increase in perirectal soft tissue nodularity with overall worsening in pulmonary and hepatic metastatic disease. Omental nodularity appears slightly improved. 3. New mediastinal and periportal adenopathy. Aortocaval lymph node is not enlarged by CT size criteria but is new and therefore considered suspicious for metastatic involvement. 4. Aortic atherosclerosis (ICD10-170.0).  Coronary artery calcification.   06/08/2019 - 09/04/2019 Chemotherapy   FOLFOXIRI q2weeks starting 06/08/19. Due to fatigue, diarrhea, nausea and low blood counts he was switched to FOLFIRI starting 06/22/19. Postponed the addition of Avastin until C4 on 08/03/19 given recent PE. Stopped 09/04/19 due to disease progression in liver.    08/30/2019 Imaging   CT CAP W Contrast 08/30/19  IMPRESSION: 1. Redemonstrated circumferential mass of the distal rectum (series 2, image 126). There has been interval removal of a previously seen rectal stent. 2. There are however newly enlarged right perirectal lymph nodes, measuring up to 6 mm (series 2, image 121). 3. Multiple bilateral pulmonary nodules are stable or slightly increased in size, the largest index nodule in the right lower lobe measuring 1.2 x 1.2 cm (series 6, image 106), a nodule of the right middle lobe measuring 5 mm, previously 3 mm or smaller (series 6, image 89). 4. Interval enlargement of multiple, somewhat ill-defined low-attenuation liver lesions, an index lesion in the right lobe of the liver measuring 3.8 x 2.8 cm, previously 1.9 x 1.7 cm (series 2, image 47). 5. Interval enlargement of an aortocaval lymph node, measuring 1.3 x 1.1 cm, previously 1.1 x 0.8 cm (series 2, image 76). Interval enlargement in ill-defined portacaval lymph nodes, the largest measuring 2.4 x 1.3 cm, previously 1.5 x 0.9 cm (series 2, image 64). 6. Above constellation of findings is consistent with worsened metastatic disease. 7. Other chronic and incidental findings as detailed above. Aortic Atherosclerosis (ICD10-I70.0).      CURRENT THERAPY:  FOLFOXIRI q2weeks starting 06/08/19. Due to fatigue, diarrhea, nausea and low blood counts he was switched to FOLFIRI starting 06/22/19. Postponed the addition of Avastingiven recent PE. Stopped 09/04/19 due to disease progression in liver.   INTERVAL HISTORY:  KeDymir Neesons here for a follow  up. He  notes he was not seen by Dr. Benson Norway. They identified themselves by face to face video. He notes he has not had rectal bleeding this week. He denies passing stent in stool although 08/30/19 CT CAP does not show it. He feels his BMs are mostly normal.  He notes he feels better since his last blood transfusion, but not at baseline. He is interested in getting another unit of blood.    REVIEW OF SYSTEMS:   Constitutional: Denies fevers, chills or abnormal weight loss Eyes: Denies blurriness of vision Ears, nose, mouth, throat, and face: Denies mucositis or sore throat Respiratory: Denies cough, dyspnea or wheezes Cardiovascular: Denies palpitation, chest discomfort or lower extremity swelling Gastrointestinal:  Denies nausea, heartburn or change in bowel habits Skin: Denies abnormal skin rashes Lymphatics: Denies new lymphadenopathy or easy bruising Neurological:Denies numbness, tingling or new weaknesses Behavioral/Psych: Mood is stable, no new changes  All other systems were reviewed with the patient and are negative.  MEDICAL HISTORY:  Past Medical History:  Diagnosis Date  . colorectal ca dx'd 07/2018  . Liver metastases ([place]) dx'd 07/2018  . Lung metastases ([place]) dx'd 07/2018    SURGICAL HISTORY: Past Surgical History:  Procedure Laterality Date  . COLONIC STENT PLACEMENT N/A 01/19/2019   Procedure: COLONIC STENT PLACEMENT;  Surgeon: Carol Ada, MD;  Location: [facility];  Service: Endoscopy;  Laterality: N/A;  . FLEXIBLE SIGMOIDOSCOPY N/A 01/19/2019   Procedure: FLEXIBLE SIGMOIDOSCOPY;  Surgeon: Carol Ada, MD;  Location: [facility];  Service: Endoscopy;  Laterality: N/A;  . IR IMAGING GUIDED PORT INSERTION  08/16/2018  . IR US GUIDE BX ASP/DRAIN  08/16/2018    I have reviewed the social history and family history with the patient and they are unchanged from previous note.  ALLERGIES:  has No Known Allergies.  MEDICATIONS:  Current Outpatient Medications  Medication  Sig Dispense Refill  . acetaminophen (TYLENOL) 325 MG tablet Take 650 mg by mouth every 6 (six) hours as needed for moderate pain.    . diphenoxylate-atropine (LOMOTIL) 2.5-0.025 MG tablet Take 1-2 tablets by mouth 4 (four) times daily as needed for diarrhea or loose stools. Up to 8 tabs daily for severe diarrhea 60 tablet 2  . loperamide (IMODIUM A-D) 2 MG tablet Take 2 mg by mouth 4 (four) times daily as needed for diarrhea or loose stools.    . magnesium oxide (MAGNESIUM-OXIDE) 400 (241.3 Mg) MG tablet Take 0.5 tablets (200 mg total) by mouth 2 (two) times daily. 30 tablet 1  . morphine (MS CONTIN) 15 MG 12 hr tablet Take 1 tablet (15 mg total) by mouth every 12 (twelve) hours as needed for pain. 60 tablet 0  . ondansetron (ZOFRAN-ODT) 4 MG disintegrating tablet Take 4 mg by mouth as needed for nausea or vomiting.    Marland Kitchen oxyCODONE (OXY IR/ROXICODONE) 5 MG immediate release tablet Take 1-2 tablets (5-10 mg total) by mouth every 6 (six) hours as needed for severe pain. 90 tablet 0  . potassium chloride SA (KLOR-CON) 20 MEQ tablet Take 20 mEq by mouth daily.    . Rivaroxaban (XARELTO) 15 MG TABS tablet Take 1 tablet (15 mg total) by mouth 2 (two) times daily with a meal. 30 tablet 1  . VITAMIN E PO Take 1 tablet by mouth daily.     No current facility-administered medications for this visit.     PHYSICAL EXAMINATION: ECOG PERFORMANCE STATUS: 2 - Symptomatic, <50% confined to bed  No vitals taken today, Exam not performed  today   LABORATORY DATA:  I have reviewed the data as listed CBC Latest Ref Rng & Units 08/23/2019 08/16/2019 08/07/2019  WBC 4.0 - 10.5 K/uL 10.8(H) 12.1(H) 32.8(H)  Hemoglobin 13.0 - 17.0 g/dL 6.6(LL) 7.4(L) 7.0(L)  Hematocrit 39.0 - 52.0 % 21.6(L) 24.5(L) 22.5(L)  Platelets 150 - 400 K/uL 90(L) 212 367     CMP Latest Ref Rng & Units 08/23/2019 08/16/2019 08/07/2019  Glucose 70 - 99 mg/dL 114(H) 109(H) 128(H)  BUN 8 - 23 mg/dL _0 Creatinine 0.61 - 1.24 mg/dL  0.86 0.96 1.01  Sodium 135 - 145 mmol/L 138 140 137  Potassium 3.5 - 5.1 mmol/L 4.2 4.1 4.6  Chloride 98 - 111 mmol/L 108 109 104  CO2 22 - 32 mmol/L 19(L) 19(L) 22  Calcium 8.9 - 10.3 mg/dL 8.1(L) 8.7(L) 9.0  Total Protein 6.5 - 8.1 g/dL 6.1(L) 6.7 6.8  Total Bilirubin 0.3 - 1.2 mg/dL 0.3 <0.2(L) 0.3  Alkaline Phos 38 - 126 U/L 375(H) 411(H) 443(H)  AST 15 - 41 U/L _1 ALT 0 - 44 U/L 31 18 33      RADIOGRAPHIC STUDIES: I have personally reviewed the radiological images as listed and agreed with the findings in the report. No results found.   ASSESSMENT & PLAN:  Billy Mendoza is a 68 y.o. male with   1. Proximal rectal adenocarcinoma, metastatic to liver,lungsandnodes, MSS,KRAS/NRAS wild type,BRAF V600E mutation (+) -He was diagnosed in10/2019.He had mixed response to first-line FOLFOXIRI and avastin and he progressed on second-line oral BRAF/MEK/EGFR inhibitors(Braftovi, Mektovi andVectibix q2weeks)which he started on 02/01/19. -HereceivedRadiationin May 2020 to his rectal tumor -Dueto previous good response in liver,Irestarted him onIV FOLFOXIRIwith lower oxaliplatin dose due to neuropathy on 06/08/19. Due to poor tolerance this was reduced to FOLFIRI starting 06/22/19 and added Avastin with C4. He tolerated reduced chemo much better, but has required frequent blood transfusion . -Given recent rectal bleeding and increase in rectal pain we opted to scan him sooner. I personally reviewed and discussed his CT CAP from 08/30/19 with pt during the video visit which showed new right liver metastasis with mild enlargement of prior liver mets, multiple tiny b/l lung nodules and aortocaval LNs are slightly increased in size which are suspicious for metastasis. Also the stent is no longer seen in rectal and his obstruction is mild currently.  -I discussed overall he has had disease progression. I recommend d/c current regimen and start next-line therapy. I discussed FDA  approved options of single agent Regorafinib and Lonsurf, unfortunately the benefit is limited.  -I recommend Regorafinib (Stivarga) with IV Nivolumab, based on the phase 1 clinical trial data (REGONIVO, JCO 37(15), 2019) which shows good response with Ragorafinib and Nivolumab in MSS metastatic colorectal cancer (objective response rate 29%). This is currently being investigated in phase 2 and 3 studies. Due to the current COVID 53 outbreak, he is not able to participate the trials, and I will offer this regimen off label.            --Chemotherapy consent: Side effects including but does not limited to, fatigue, nausea, vomiting, diarrhea, hand-foot syndrome, hypertension, risk of bleeding and thrombosis, proteinuria and renal small risk of bowel perforation, function, and other endocrine and autoimmune related disorders, pneumonitis,etc were discussed with patient in great detail. He agrees to proceed. -the goal of therapy is palliative -I discussed that Clement Husbands is off label use, I will try drug replacement if his insurance denies it. If not successful, will  use Keytruda to replace it  -I discussed standard treatment options are starting to decrease. I discussed clinical trail options later down the road, he is interested.  -F/u in 2 weeks    2.History ofbowel obstruction from rectal tumor -s/p stent placement by Dr. Benson Norway on 01/19/2019,he declined loop colostomybut agree to keep surgery as last resort.  -He received a course of radiation -He recently has had pain progression. He remains on MS Contin 58m BID (every dose taken)and Oxycodone 543mq6hours. Will continue. Will increase MS Contin dose if needed.  -To avoid constipation, I again encouraged him to use prophylactic daily Miralax every 1-2 days. If no BM for 3 days he can use Magnesium Citrate. He understands. -He still has mild rectal pain is still attributes to stent.  -His 08/30/19 CT shows stent is not present. He denies noting he  passed stent in stool. I encouraged him to watch his BMs. I will contact Dr HuBenson Norwaybout further evaluation.   3. Rectal bleeding -secondary to cancer and anticoagulation  -Intermittent, worse with anticoagulation -Has mildly recurred for the past 2-3 days. I strongly encouraged him to take prophylactic stool softener daily to avoid constipation. Continue monitoring. -due to significant rectal bleeding, Xarelto was reduced to 1065maily   4. Iron deficiency anemia, secondary to blood loss from tumor  -He receivedinitial 2 doses ofIV Feraheme on 5/20/20and 03/02/19.  -I encouraged him to take oral prenatal vitamin with iron.  -He has been having recent rectal bleeding.  -Last blood transfusionon11/25/20.   5.History of right PE, RecentAcute saddle PE and left LE DVTin 05/2019 -His PE is likely related to his underlying metastatic colon cancer and chemotherapy, especially Avastin. -He was already on Xarelto but not consistently due to occasional epistaxis and rectal bleeding.  -His 05/2019 PE/DVT was treated with Lovenox injections BID for 2 months, then returned to Xarelto 90m88m encouraged him to continue if his rectal bleeding is mild, due to his high risk of thrombosis.Certainly if he develops severe rectal bleeding, will hold or reduce dose -Given recent mild rectal bleeding, will previously reduce Xarelto to 15mg70m/11/20), and now on 10mg 18my -recent CT showed resolved PE (08/30/2019)  6. HTN  -TakesAmlodipine -BPusually controlled  7. Goal of care discussion -He is full code now -He understands his disease is incurable,and the goal of therapy is palliative to prolong his life.   Plan -CT CAP shows cancer progression especially liver metastasis.  -I will call in Regorafinib 80mg d47m day 1-21 every 28 days for first cycle  -Cancel f/u on 12/3, he will come in for lab to see if he needs blood transfusion  -copy Dr. Hung  -Benson Norwayin 2 weeks   No  problem-specific Assessment & Plan notes found for this encounter.   No orders of the defined types were placed in this encounter.  I discussed the assessment and treatment plan with the patient. The patient was provided an opportunity to ask questions and all were answered. The patient agreed with the plan and demonstrated an understanding of the instructions.  The patient was advised to call back or seek an in-person evaluation if the symptoms worsen or if the condition fails to improve as anticipated.   I provided 40 minutes of face-to-face video visit time during this encounter, and > 50% was spent counseling as documented under my assessment & plan.    Shane Melby FenTruitt Merle/30/2020   I, Amoya BJoslyn Devonting as scribe for Buelah Rennie FenTruitt Merle  I have reviewed the above documentation for accuracy and completeness, and I agree with the above.

## 2019-09-05 ENCOUNTER — Telehealth: Payer: Self-pay | Admitting: Hematology

## 2019-09-05 ENCOUNTER — Telehealth: Payer: Self-pay | Admitting: Pharmacist

## 2019-09-05 ENCOUNTER — Other Ambulatory Visit: Payer: Self-pay | Admitting: Nurse Practitioner

## 2019-09-05 ENCOUNTER — Encounter: Payer: Self-pay | Admitting: Hematology

## 2019-09-05 DIAGNOSIS — C20 Malignant neoplasm of rectum: Secondary | ICD-10-CM

## 2019-09-05 MED ORDER — OXYCODONE HCL 5 MG PO TABS: 5.0000 mg | ORAL_TABLET | Freq: Four times a day (QID) | ORAL | 0 refills | Status: DC | PRN

## 2019-09-05 NOTE — Telephone Encounter (Addendum)
Oral Oncology Pharmacist [employer] authorization for Stivarga (regorafenib) tablets submitted to [employer]  insurance on Cover My Meds  Key: [id number] Status: pending  This encounter will continue to be updated until final determination.  Johny Drilling, PharmD, BCPS, BCOP  09/06/2019  12:21 PM  Oral Oncology Clinic [phone]

## 2019-09-05 NOTE — Progress Notes (Signed)
DISCONTINUE ON PATHWAY REGIMEN - Colorectal     A cycle is every 14 days:     Bevacizumab-xxxx      Irinotecan      Oxaliplatin      Leucovorin      Fluorouracil   **Always confirm dose/schedule in your pharmacy ordering system**  REASON: Disease Progression PRIOR TREATMENT: [id number]: FOLFOXIRI + Bevacizumab q14 Days TREATMENT RESPONSE: Progressive Disease (PD)  START OFF PATHWAY REGIMEN - Colorectal   [id number]:Nivolumab 3 mg/kg q14 Days:   A cycle is every 14 days:     Nivolumab   **Always confirm dose/schedule in your pharmacy ordering system**  Patient Characteristics: Distant Metastases, Nonsurgical Candidate, BRAF V600 Mutation Positive (KRAS/NRAS Wild-Type), Standard Cytotoxic/Targeted Therapy, Third Line Standard Cytotoxic/Targeted Therapy Tumor Location: Rectal Therapeutic Status: Distant Metastases Microsatellite/Mismatch Repair Status: MSS/pMMR BRAF Mutation Status: Mutation Positive KRAS/NRAS Mutation Status: Wild-Type (no mutation) Standard Cytotoxic/Targeted Line of Therapy: Third Line Standard Cytotoxic/Targeted Therapy Intent of Therapy: Non-Curative / Palliative Intent, Discussed with Patient

## 2019-09-05 NOTE — Telephone Encounter (Signed)
I refilled,  Thanks, Raneen Jaffer

## 2019-09-05 NOTE — Telephone Encounter (Signed)
No los per 11/30. °

## 2019-09-06 ENCOUNTER — Other Ambulatory Visit: Payer: Self-pay | Admitting: Hematology

## 2019-09-06 ENCOUNTER — Encounter: Payer: Self-pay | Admitting: Pharmacy Technician

## 2019-09-06 ENCOUNTER — Telehealth: Payer: Self-pay | Admitting: Pharmacist

## 2019-09-06 DIAGNOSIS — C20 Malignant neoplasm of rectum: Secondary | ICD-10-CM

## 2019-09-06 DIAGNOSIS — C787 Secondary malignant neoplasm of liver and intrahepatic bile duct: Secondary | ICD-10-CM

## 2019-09-06 MED ORDER — REGORAFENIB 40 MG PO TABS: 80.0000 mg | ORAL_TABLET | Freq: Every day | ORAL | 0 refills | Status: DC

## 2019-09-06 NOTE — Telephone Encounter (Signed)
Oral Oncology Pharmacist Encounter  Received new prescription for Stivarga (regorafenib) for the treatment of heavily pretreated, metastatic, rectal cancer in conjunction with nivolumab or pembrolizumab, planned duration until disease progression or unacceptable toxicity.  Stivarga will be administered at 80mg  once daily on a 3 weeks on, 1 week off schedule, repeated every 4 weeks. Nivolumab will be administeredat 3mg /kg IV q 2-4 weeks This isbased on positive phase 1b REGONIVO trial data If we are unable to obtain nivolumab for this patient, pembrolizumab at 200 mg IV given once every 3 weeks will be used instead for the same mechanism of action.  Labs from 08/23/19 assessed, OK for treatment initiation.  Noted pltc=90k, will be monitored during treatment  Corrected Ca=9.1 (albumin=2.8), hypocalcemia is possible with regorafenib and will be monitored  BPs in Epic reviewed, readings WNL or below, will be monitored on treatment  08/16/19 urine protein = trace, will continue to be monitored  No baseline TSH, will be monitored during treatment  Current medication list in Epic reviewed, no significant DDIs with regorafenib identified.  Prescription has been e-scribed to the [facility] for benefits analysis and approval.  Oral Oncology Clinic will continue to follow for insurance authorization, copayment issues, initial counseling and start date.  Johny Drilling, PharmD, BCPS, BCOP  09/06/2019 9:29 AM Oral Oncology Clinic [phone]

## 2019-09-07 ENCOUNTER — Inpatient Hospital Stay: Payer: BC Managed Care – PPO

## 2019-09-07 ENCOUNTER — Ambulatory Visit: Payer: BC Managed Care – PPO | Admitting: Hematology

## 2019-09-07 ENCOUNTER — Inpatient Hospital Stay: Payer: BC Managed Care – PPO | Attending: Adult Health

## 2019-09-07 ENCOUNTER — Other Ambulatory Visit: Payer: Self-pay | Admitting: Hematology

## 2019-09-07 ENCOUNTER — Other Ambulatory Visit: Payer: Self-pay

## 2019-09-07 ENCOUNTER — Other Ambulatory Visit: Payer: Self-pay | Admitting: *Deleted

## 2019-09-07 VITALS — BP 133/86 | HR 68 | Temp 98.4°F | Resp 18

## 2019-09-07 DIAGNOSIS — C787 Secondary malignant neoplasm of liver and intrahepatic bile duct: Secondary | ICD-10-CM

## 2019-09-07 DIAGNOSIS — Z7901 Long term (current) use of anticoagulants: Secondary | ICD-10-CM | POA: Insufficient documentation

## 2019-09-07 DIAGNOSIS — Z86718 Personal history of other venous thrombosis and embolism: Secondary | ICD-10-CM | POA: Insufficient documentation

## 2019-09-07 DIAGNOSIS — C7800 Secondary malignant neoplasm of unspecified lung: Secondary | ICD-10-CM | POA: Diagnosis not present

## 2019-09-07 DIAGNOSIS — D649 Anemia, unspecified: Secondary | ICD-10-CM

## 2019-09-07 DIAGNOSIS — R748 Abnormal levels of other serum enzymes: Secondary | ICD-10-CM | POA: Insufficient documentation

## 2019-09-07 DIAGNOSIS — C20 Malignant neoplasm of rectum: Secondary | ICD-10-CM

## 2019-09-07 DIAGNOSIS — Z86711 Personal history of pulmonary embolism: Secondary | ICD-10-CM | POA: Insufficient documentation

## 2019-09-07 DIAGNOSIS — Z79899 Other long term (current) drug therapy: Secondary | ICD-10-CM | POA: Diagnosis not present

## 2019-09-07 DIAGNOSIS — D630 Anemia in neoplastic disease: Secondary | ICD-10-CM | POA: Diagnosis not present

## 2019-09-07 DIAGNOSIS — Z95828 Presence of other vascular implants and grafts: Secondary | ICD-10-CM

## 2019-09-07 DIAGNOSIS — I10 Essential (primary) hypertension: Secondary | ICD-10-CM | POA: Diagnosis not present

## 2019-09-07 DIAGNOSIS — E860 Dehydration: Secondary | ICD-10-CM | POA: Diagnosis not present

## 2019-09-07 LAB — TOTAL PROTEIN, URINE DIPSTICK: Protein, ur: NEGATIVE mg/dL

## 2019-09-07 LAB — SAMPLE TO BLOOD BANK

## 2019-09-07 LAB — CMP (CANCER CENTER ONLY)
ALT: 35 U/L (ref 0–44)
AST: 27 U/L (ref 15–41)
Albumin: 3 g/dL — ABNORMAL LOW (ref 3.5–5.0)
Alkaline Phosphatase: 426 U/L — ABNORMAL HIGH (ref 38–126)
Anion gap: 10 (ref 5–15)
BUN: 16 mg/dL (ref 8–23)
CO2: 21 mmol/L — ABNORMAL LOW (ref 22–32)
Calcium: 8.7 mg/dL — ABNORMAL LOW (ref 8.9–10.3)
Chloride: 110 mmol/L (ref 98–111)
Creatinine: 0.83 mg/dL (ref 0.61–1.24)
GFR, Est AFR Am: >60 mL/min (ref >60)
GFR, Estimated: >60 mL/min (ref >60)
Glucose, Bld: 128 mg/dL — ABNORMAL HIGH (ref 70–99)
Potassium: 4.1 mmol/L (ref 3.5–5.1)
Sodium: 141 mmol/L (ref 135–145)
Total Bilirubin: 0.3 mg/dL (ref 0.3–1.2)
Total Protein: 7 g/dL (ref 6.5–8.1)

## 2019-09-07 LAB — CBC WITH DIFFERENTIAL (CANCER CENTER ONLY)
Abs Immature Granulocytes: 0.08 10*3/uL — ABNORMAL HIGH (ref 0.00–0.07)
Basophils Absolute: 0.1 10*3/uL (ref 0.0–0.1)
Basophils Relative: 1 %
Eosinophils Absolute: 0.2 10*3/uL (ref 0.0–0.5)
Eosinophils Relative: 2 %
HCT: 25.7 % — ABNORMAL LOW (ref 39.0–52.0)
Hemoglobin: 7.6 g/dL — ABNORMAL LOW (ref 13.0–17.0)
Immature Granulocytes: 1 %
Lymphocytes Relative: 19 %
Lymphs Abs: 1.9 10*3/uL (ref 0.7–4.0)
MCH: 30.9 pg (ref 26.0–34.0)
MCHC: 29.6 g/dL — ABNORMAL LOW (ref 30.0–36.0)
MCV: 104.5 fL — ABNORMAL HIGH (ref 80.0–100.0)
Monocytes Absolute: 1.1 10*3/uL — ABNORMAL HIGH (ref 0.1–1.0)
Monocytes Relative: 10 %
Neutro Abs: 6.9 10*3/uL (ref 1.7–7.7)
Neutrophils Relative %: 67 %
Platelet Count: 285 10*3/uL (ref 150–400)
RBC: 2.46 MIL/uL — ABNORMAL LOW (ref 4.22–5.81)
RDW: 20.6 % — ABNORMAL HIGH (ref 11.5–15.5)
WBC Count: 10.3 10*3/uL (ref 4.0–10.5)
nRBC: 0 % (ref 0.0–0.2)

## 2019-09-07 LAB — CEA (IN HOUSE-CHCC): CEA (CHCC-In House): 103.37 ng/mL — ABNORMAL HIGH (ref 0.00–5.00)

## 2019-09-07 LAB — TSH: TSH: 2.691 u[IU]/mL (ref 0.320–4.118)

## 2019-09-07 LAB — PREPARE RBC (CROSSMATCH)

## 2019-09-07 LAB — CK: Total CK: 21 U/L — ABNORMAL LOW (ref 49–397)

## 2019-09-07 LAB — FERRITIN: Ferritin: 64 ng/mL (ref 24–336)

## 2019-09-07 MED ORDER — ACETAMINOPHEN 325 MG PO TABS
ORAL_TABLET | ORAL | Status: AC
  Filled 2019-09-07: qty 2

## 2019-09-07 MED ORDER — REGORAFENIB 40 MG PO TABS: 80.0000 mg | ORAL_TABLET | Freq: Every day | ORAL | 0 refills | Status: DC

## 2019-09-07 MED ORDER — DIPHENHYDRAMINE HCL 25 MG PO CAPS
25.0000 mg | ORAL_CAPSULE | Freq: Once | ORAL | Status: AC
  Administered 2019-09-07: 12:00:00 25 mg via ORAL

## 2019-09-07 MED ORDER — OXYCODONE-ACETAMINOPHEN 5-325 MG PO TABS
ORAL_TABLET | ORAL | Status: AC
  Filled 2019-09-07: qty 1

## 2019-09-07 MED ORDER — HEPARIN SOD (PORK) LOCK FLUSH 100 UNIT/ML IV SOLN
500.0000 [IU] | Freq: Every day | INTRAVENOUS | Status: AC | PRN
  Administered 2019-09-07: 500 [IU]
  Filled 2019-09-07: qty 5

## 2019-09-07 MED ORDER — ACETAMINOPHEN 325 MG PO TABS
650.0000 mg | ORAL_TABLET | Freq: Once | ORAL | Status: AC
  Administered 2019-09-07: 650 mg via ORAL

## 2019-09-07 MED ORDER — DIPHENHYDRAMINE HCL 25 MG PO CAPS
ORAL_CAPSULE | ORAL | Status: AC
  Filled 2019-09-07: qty 1

## 2019-09-07 MED ORDER — SODIUM CHLORIDE 0.9% FLUSH
10.0000 mL | INTRAVENOUS | Status: DC | PRN
  Administered 2019-09-07: 10 mL
  Filled 2019-09-07: qty 10

## 2019-09-07 MED ORDER — OXYCODONE-ACETAMINOPHEN 5-325 MG PO TABS
1.0000 | ORAL_TABLET | Freq: Once | ORAL | Status: AC
  Administered 2019-09-07: 14:00:00 1 via ORAL

## 2019-09-07 MED ORDER — SODIUM CHLORIDE 0.9% FLUSH
10.0000 mL | INTRAVENOUS | Status: AC | PRN
  Administered 2019-09-07: 10 mL
  Filled 2019-09-07: qty 10

## 2019-09-07 MED ORDER — SODIUM CHLORIDE 0.9% IV SOLUTION
250.0000 mL | Freq: Once | INTRAVENOUS | Status: AC
  Administered 2019-09-07: 12:00:00 250 mL via INTRAVENOUS
  Filled 2019-09-07: qty 250

## 2019-09-07 NOTE — Progress Notes (Signed)
Per Dr Burr Medico no infusion today. Possibly getting blood based on labs. Pt waiting in blue room. Per Dr Burr Medico her nurse will come out and talk with him about his labs when they are done. Pt reminded not to go home with port accessed if not getting blood today. Pt verbalized understanding

## 2019-09-07 NOTE — Patient Instructions (Signed)
Blood Transfusion, Adult, Care After This sheet gives you information about how to care for yourself after your procedure. Your doctor may also give you more specific instructions. If you have problems or questions, contact your doctor. Follow these instructions at home:   Take over-the-counter and prescription medicines only as told by your doctor.  Go back to your normal activities as told by your doctor.  Follow instructions from your doctor about how to take care of the area where an IV tube was put into your vein (insertion site). Make sure you: ? Wash your hands with soap and water before you change your bandage (dressing). If there is no soap and water, use hand sanitizer. ? Change your bandage as told by your doctor.  Check your IV insertion site every day for signs of infection. Check for: ? More redness, swelling, or pain. ? More fluid or blood. ? Warmth. ? Pus or a bad smell. Contact a doctor if:  You have more redness, swelling, or pain around the IV insertion site.  You have more fluid or blood coming from the IV insertion site.  Your IV insertion site feels warm to the touch.  You have pus or a bad smell coming from the IV insertion site.  Your pee (urine) turns pink, red, or brown.  You feel weak after doing your normal activities. Get help right away if:  You have signs of a serious allergic or body defense (immune) system reaction, including: ? Itchiness. ? Hives. ? Trouble breathing. ? Anxiety. ? Pain in your chest or lower back. ? Fever, flushing, and chills. ? Fast pulse. ? Rash. ? Watery poop (diarrhea). ? Throwing up (vomiting). ? Dark pee. ? Serious headache. ? Dizziness. ? Stiff neck. ? Yellow color in your face or the white parts of your eyes (jaundice). Summary  After a blood transfusion, return to your normal activities as told by your doctor.  Every day, check for signs of infection where the IV tube was put into your vein.  Some  signs of infection are warm skin, more redness and pain, more fluid or blood, and pus or a bad smell where the needle went in.  Contact your doctor if you feel weak or have any unusual symptoms. This information is not intended to replace advice given to you by your health care provider. Make sure you discuss any questions you have with your health care provider. Document Released: 10/12/2014 Document Revised: 01/26/2018 Document Reviewed: 05/15/2016 Elsevier Patient Education  2020 [employer]  Coronavirus (COVID-19) Are you at risk?  Are you at risk for the Coronavirus (COVID-19)?  To be considered HIGH RISK for Coronavirus (COVID-19), you have to meet the following criteria:  . Traveled to [place], [place], [place], [place] or [place]; or in the [place] to [place], [place], [place], or [place]; and have fever, cough, and shortness of breath within the last 2 weeks of travel OR . Been in close contact with a person diagnosed with COVID-19 within the last 2 weeks and have fever, cough, and shortness of breath . IF YOU DO NOT MEET THESE CRITERIA, YOU ARE CONSIDERED LOW RISK FOR COVID-19.  What to do if you are HIGH RISK for COVID-19?  . If you are having a medical emergency, call 911. . Seek medical care right away. Before you go to a doctor's office, urgent care or emergency department, call ahead and tell them about your recent travel, contact with someone diagnosed with COVID-19, and   your symptoms. You should receive instructions from your physician's office regarding next steps of care.  . When you arrive at healthcare provider, tell the healthcare staff immediately you have returned from visiting [place], [place], [place], [place] or [place]; or traveled in the [place] to [place], [place], [place], or [place]; in the last two weeks or you have been in close contact with a person diagnosed with COVID-19 in the last 2 weeks.   . Tell the health care staff about  your symptoms: fever, cough and shortness of breath. . After you have been seen by a medical provider, you will be either: o Tested for (COVID-19) and discharged home on quarantine except to seek medical care if symptoms worsen, and asked to  - Stay home and avoid contact with others until you get your results (4-5 days)  - Avoid travel on public transportation if possible (such as bus, train, or airplane) or o Sent to the Emergency Department by EMS for evaluation, COVID-19 testing, and possible admission depending on your condition and test results.  What to do if you are LOW RISK for COVID-19?  Reduce your risk of any infection by using the same precautions used for avoiding the common cold or flu:  . Wash your hands often with soap and warm water for at least 20 seconds.  If soap and water are not readily available, use an alcohol-based hand sanitizer with at least 60% alcohol.  . If coughing or sneezing, cover your mouth and nose by coughing or sneezing into the elbow areas of your shirt or coat, into a tissue or into your sleeve (not your hands). . Avoid shaking hands with others and consider head nods or verbal greetings only. . Avoid touching your eyes, nose, or mouth with unwashed hands.  . Avoid close contact with people who are sick. . Avoid places or events with large numbers of people in one location, like concerts or sporting events. . Carefully consider travel plans you have or are making. . If you are planning any travel outside or inside the [place], visit the CDC's Travelers' Health webpage for the latest health notices. . If you have some symptoms but not all symptoms, continue to monitor at home and seek medical attention if your symptoms worsen. . If you are having a medical emergency, call 911.   ADDITIONAL HEALTHCARE OPTIONS FOR PATIENTS  [facility] / e-Visit: [email or web address]         [facility]:  [phone]  [facility]: [phone]                   [facility]: [phone]   

## 2019-09-07 NOTE — Telephone Encounter (Signed)
Oral Oncology Pharmacist Encounter  Insurance authorization for Stivarga (regorafenib) tablets submitted to [employer] insurance on Cover My Meds  Key: [id number] Status: approved Case# [id number] Effective dates: 09/06/2019 - 12/05/2019  Johny Drilling, PharmD, BCPS, BCOP  09/07/2019   8:45 AM Oral Oncology Clinic [phone]

## 2019-09-07 NOTE — Telephone Encounter (Signed)
Oral Oncology Pharmacist Encounter  I surance authorization for Billy Mendoza has been approved. Stivarga must be filled at [facility] per insurance requirement.  Stivarga 40 mg tablet prescription has been e-scribed to [facility].  I will reach out to the patient to discuss initial counseling and site of dispensing once I confirm pharmacy is able to process patient's claim.  Johny Drilling, PharmD, BCPS, BCOP  09/07/2019 9:22 AM Oral Oncology Clinic [phone]

## 2019-09-08 ENCOUNTER — Other Ambulatory Visit: Payer: Self-pay | Admitting: Hematology

## 2019-09-08 LAB — TYPE AND SCREEN
ABO/RH(D): O POS
Antibody Screen: NEGATIVE
Unit division: 0
Unit division: 0

## 2019-09-08 LAB — BPAM RBC
Blood Product Expiration Date: 202101012359
Blood Product Expiration Date: 202101012359
ISSUE DATE / TIME: 202012031158
ISSUE DATE / TIME: 202012031158
Unit Type and Rh: 5100
Unit Type and Rh: 5100

## 2019-09-08 MED ORDER — MORPHINE SULFATE ER 15 MG PO TBCR: 15.0000 mg | EXTENDED_RELEASE_TABLET | Freq: Two times a day (BID) | ORAL | 0 refills | Status: DC | PRN

## 2019-09-08 NOTE — Telephone Encounter (Signed)
Oral Chemotherapy Pharmacist Encounter   I spoke with patient for overview of: Stivarga (regorafenib) for the treatment of heavily pretreated, metastatic, rectal cancer in conjunction with pembrolizumab, planned duration until disease progression or unacceptable toxicity.  Counseled patient on administration, dosing, side effects, monitoring, drug-food interactions, safe handling, storage, and disposal.  Patient's Stivarga dose will be initiated on a dose up-titration schedule.  For weeks 1 - 3: Patient will take Stivarga 40mg  tablets, 2 tablets (80mg ) by mouth once daily with a glass of water and a low fat meal of <600 calories, and <30% fat content.  Patient will be off Stivarga for week 4.  Patient knows to avoid grapefruit and grapefruit juice while on therapy with Stivarga.  Stivarga start date: TBD, pending medication acquisition of the   Adverse effects include but are not limited to: hand-foot syndrome, diarrhea, nausea, abdominal pain, musculoskeletal pain, fatigue, hypertension, delayed wound healing, decreased blood counts, and hepatotoxicity.    Patient has anti-emetic on hand and knows to take it if nausea develops.   Patient will obtain anti diarrheal and alert the office of 4 or more loose stools above baseline.  Reviewed with patient importance of keeping a medication schedule and plan for any missed doses.  Stivarga should be discontinued 2 weeks prior to any planned surgery and resumed postsurgery based on clinical judgement of wound healing.  Medication reconciliation performed and medication/allergy list updated.  Insurance authorization for Marylyn Ishihara has been obtained. Prescription is currently in process at [facility]. Patient knows to expect a call from mandated dispensing pharmacy to schedule shipment. Patient previously worked with [facility] for his Xeloda.  All questions answered.  Mr. Hutton voiced understanding and appreciation.   Patient knows to call the  office with questions or concerns.  Johny Drilling, PharmD, BCPS, BCOP  09/08/2019   1:35 PM Oral Oncology Clinic [phone]

## 2019-09-09 ENCOUNTER — Inpatient Hospital Stay: Payer: BC Managed Care – PPO

## 2019-09-09 ENCOUNTER — Other Ambulatory Visit: Payer: Self-pay | Admitting: Hematology

## 2019-09-13 ENCOUNTER — Telehealth: Payer: Self-pay

## 2019-09-13 ENCOUNTER — Telehealth: Payer: Self-pay | Admitting: Hematology

## 2019-09-13 DIAGNOSIS — C20 Malignant neoplasm of rectum: Secondary | ICD-10-CM

## 2019-09-13 DIAGNOSIS — C787 Secondary malignant neoplasm of liver and intrahepatic bile duct: Secondary | ICD-10-CM

## 2019-09-13 MED ORDER — OXYCODONE HCL 5 MG PO TABS: 5.0000 mg | ORAL_TABLET | Freq: Three times a day (TID) | ORAL | 0 refills | Status: DC | PRN

## 2019-09-13 NOTE — Telephone Encounter (Signed)
Patient calls for refill for Oxycodone has about 10 left, last filled 12/1 #90.

## 2019-09-13 NOTE — Telephone Encounter (Signed)
I received a medication refill of oxycodone.  Oxycodone 5 mg 90 tablets was called in December 1st. I called pt, he has been taking oxycodone 2 tablets every 4-6 hours, average 10 tablets a day in addition to MS Contin 30 mg twice daily.  This is mainly for his rectal pain and abdominal discomfort.  We again reviewed narcotics addiction, and he noticed not take oxycodone unless his pain level is above 5-6/10. Pt is compliant, does not appear to be drug seeking. I will increase his MS contin to 30mg  q12h (he will try 15mg  am and 30mg  pm for a few days first), then use oxycodone 5mg  1-2 tabs every 8 hours as need, no more than 6 tabs daily. He wrote down the instruction, and will buy a pill box to tracking his oxycodone use.   I will refill his oxycodone 5mg  #84 today for 2 weeks supply.   I ask him to call me when he receives [place], and he will like start when he gets it, before Keytruda infusion.  Truitt Merle  09/13/2019

## 2019-09-15 ENCOUNTER — Telehealth: Payer: Self-pay

## 2019-09-15 NOTE — Telephone Encounter (Signed)
Reached out to [facility] for status on [facility] prescription. Spoke with [name] who stated they had been trying to reach patient since 09/12/19 to schedule delivery with no success. I also attempted to call the patient and his wife, with no answer. I left the wife a voicemail. Mr. Byington voicemail was full.  [facility] Patient Billy Mendoza Phone [phone] Fax [phone] 09/15/2019 11:44 AM

## 2019-09-18 NOTE — Telephone Encounter (Signed)
Spoke with Angelica at [facility], Billy Mendoza is scheduled for delivery to patient's home address on Wednesday 12/16.  [facility] Patient [place] Phone [phone] Fax [phone] 09/18/2019 3:49 PM

## 2019-09-19 NOTE — Progress Notes (Signed)
[place]   [phone] Fax:([phone]) [phone]   Clinic Follow up Note   Patient Care Team: Merry Proud, MD as PCP - General (Internal Medicine) Truitt Merle, MD as Consulting Physician (Hematology) Carol Ada, MD as Consulting Physician (Gastroenterology)  Date of Service:  09/21/2019  CHIEF COMPLAINT: F/u of rectal cancer metastasized to liver  SUMMARY OF ONCOLOGIC HISTORY: Oncology History Overview Note  Cancer Staging Rectal cancer metastasized to liver [facility]) Staging form: Colon and Rectum, AJCC 8th Edition - Clinical stage from 07/28/2018: Stage IVB (cTX, cNX, pM1b) - Signed by Truitt Merle, MD on 08/18/2018     Rectal cancer metastasized to liver ([place])  07/28/2018 Initial Biopsy   Biopsy 07/28/18 Final Microscopic Diagnosis A.Colon, Ascending, Polyp:  -Fragments of tubular adenoma -No high-grade dysplasia of malignancy.  B. Rectum, Mass, Polyp:  -Invasive adenocarcinoma, moderately - poorly differentiatred.    07/28/2018 Cancer Staging   Staging form: Colon and Rectum, AJCC 8th Edition - Clinical stage from 07/28/2018: Stage IVB (cTX, cNX, pM1b) - Signed by Truitt Merle, MD on 08/18/2018   08/04/2018 Initial Diagnosis   Rectal cancer metastasized to liver ([place])   08/16/2018 Pathology Results   Biospy  Diagnosis 08/16/18 Liver, needle/core biopsy - ADENOCARCINOMA. Microscopic Comment By morphology, the findings are compatible with the provided clinical history of colorectal adenocarcinoma. Results reported to [name] on 08/18/2018. Intradepartmental consultation was obtained ([name]).    08/16/2018 Miscellaneous   Foundation 1 genomic testing: MS-stable Tumor mutation burden  3/Mb K-ras/NRAS wild-type BRAF V600 E KDMSA R266Q PTEN T367f* SMAD4 loss TP53 loss     08/18/2018 Procedure   Colonoscopy by Dr. HBenson Norwayshowed malignant appearing partially obstructing tumor in the rectum, 8 cm above anal verge.   08/18/2018  - 09/07/2018 Chemotherapy   CAPOX q3weeks with Xeloda 20067mBID 2 weeks on, 1 weeks off starting 08/18/18.  Swithced treatment due to BRAF mutation.    09/07/2018 - 12/24/2018 Chemotherapy   FOLFOXIRI and avastin every 2 weeks starting 09/07/18. Oxaliplatin held starting with cycle 8 and continue with FOLFIRI and Avastin. D/c due to disease progression.    11/04/2018 Imaging   CT CAP 11/04/18 IMPRESSION: 1. Peripheral filling defect in the right main pulmonary artery extending primarily into the right lower lobe pulmonary artery. While the peripheral location indicates some degree of chronicity of this pulmonary embolus, this is a new finding compared to 07/28/2018. The right ventricular to left ventricular ratio is 0.7, which does not specifically indicate right heart strain. 2. The malignancy appears considerably improved, with reduced size pulmonary nodules; reduced size and increased internal necrosis of the hepatic metastatic lesions; marked improvement in the thoracic adenopathy; reduced conspicuity of the rectal tumor; and significant reduction in perirectal tumor burden/adenopathy. 3. Other imaging findings of potential clinical significance: Aortic Atherosclerosis (ICD10-I70.0). Coronary atherosclerosis. Bilateral renal cysts. Small umbilical hernia contains adipose tissue.  Critical Value/emergent results were called by telephone at the time of interpretation on 11/04/2018 at 3:05 pm to Dr. YATruitt Merle who verbally acknowledged these results.   01/18/2019 Imaging   CT AP W contrast  IMPRESSION: 1. Colonic obstruction resulting from rectal carcinoma. 2. Hepatic metastatic disease which is decreased in size compared with the prior examination of 11/04/2018. 3. Multiple right middle lobe and right lower lobe pulmonary nodules most consistent with metastatic disease.   02/01/2019 - 06/01/2019 Chemotherapy   second line BRAFTOVI(encorafenib) 30046maily and MEKTOVI(binimetinib),  19m80m2h and Vectibix q2weeks starting 02/01/19.  -He has held  Mektovi 02/19/19-04/06/19 due to RT  -Stopped on 06/01/19 due to disease progression    02/13/2019 Imaging   CT Chest  IMPRESSION: Small bilateral pulmonary metastases, mildly progressive from January 2020.  Multifocal hepatic metastases, incompletely visualized, favored to be progressive from April 2020.  Aortic Atherosclerosis (ICD10-I70.0).    02/22/2019 - 03/21/2019 Radiation Therapy   -Palliative local radiation  -Holding mektovi (binimetinib) during RT -Due to significant rectal pain, diarrhea and stool incontinence, he stopped RT after 03/13/19. He was only able to finish 1/2 remianing treatments on 03/21/19.    05/31/2019 Imaging   CT CAP W contrast 05/31/19  IMPRESSION: 1. Pulmonary emboli with a saddle embolus. Critical Value/emergent results were called by telephone at the time of interpretation on 05/31/2019 at 12:03 pm to Cira Rue, NP , who verbally acknowledged these results. 2. Slight increase in perirectal soft tissue nodularity with overall worsening in pulmonary and hepatic metastatic disease. Omental nodularity appears slightly improved. 3. New mediastinal and periportal adenopathy. Aortocaval lymph node is not enlarged by CT size criteria but is new and therefore considered suspicious for metastatic involvement. 4. Aortic atherosclerosis (ICD10-170.0). Coronary artery calcification.   06/08/2019 - 09/04/2019 Chemotherapy   FOLFOXIRI q2weeks starting 06/08/19. Due to fatigue, diarrhea, nausea and low blood counts he was switched to FOLFIRI starting 06/22/19. Postponed the addition of Avastin until C4 on 08/03/19 given recent PE. Stopped 09/04/19 due to disease progression in liver.    08/30/2019 Imaging   CT CAP W Contrast 08/30/19  IMPRESSION: 1. Redemonstrated circumferential mass of the distal rectum (series 2, image 126). There has been interval removal of a previously seen rectal stent. 2. There  are however newly enlarged right perirectal lymph nodes, measuring up to 6 mm (series 2, image 121). 3. Multiple bilateral pulmonary nodules are stable or slightly increased in size, the largest index nodule in the right lower lobe measuring 1.2 x 1.2 cm (series 6, image 106), a nodule of the right middle lobe measuring 5 mm, previously 3 mm or smaller (series 6, image 89). 4. Interval enlargement of multiple, somewhat ill-defined low-attenuation liver lesions, an index lesion in the right lobe of the liver measuring 3.8 x 2.8 cm, previously 1.9 x 1.7 cm (series 2, image 47). 5. Interval enlargement of an aortocaval lymph node, measuring 1.3 x 1.1 cm, previously 1.1 x 0.8 cm (series 2, image 76). Interval enlargement in ill-defined portacaval lymph nodes, the largest measuring 2.4 x 1.3 cm, previously 1.5 x 0.9 cm (series 2, image 64). 6. Above constellation of findings is consistent with worsened metastatic disease. 7. Other chronic and incidental findings as detailed above. Aortic Atherosclerosis (ICD10-I70.0).   09/22/2019 -  Chemotherapy   Regorafinib (Stivarga) 69m daily 3 weeks on/1 week off starting 09/22/19 with IV Keytruda q3weeks starting soon      CURRENT THERAPY:  Regorafinib (Stivarga) 866mdaily 3 weeks on/1 week off starting 09/22/19 with IV Keytruda q3weeks starting soon.   INTERVAL HISTORY:  Billy Mendoza here for a follow up and treatment. He presents to the clinic alone. He notes he feels well today. He notes his recta bleeding slowed down and had not presented for a week until this morning. He takes 1049marelto. His is on MS Contin 71m80mD and oxycodone 6 tablets in 1 day. He feels his rectal seems controled on this. He still has other factors that cause irritation to pain site. He denies nausea or bloating. He feels he is able to function adequately and trying to  manage his treatment and pain. He notes he has Regorafinib and is ready to start tomorrow. He  notes he has increased water intake.   He notes he saw Dr. Benson Norway earlier this week for sigmoidoscopy. He notes Dr. Benson Norway sent his information to another PCP who is not his.     REVIEW OF SYSTEMS:   Constitutional: Denies fevers, chills or abnormal weight loss Eyes: Denies blurriness of vision Ears, nose, mouth, throat, and face: Denies mucositis or sore throat Respiratory: Denies cough, dyspnea or wheezes Cardiovascular: Denies palpitation, chest discomfort or lower extremity swelling Gastrointestinal:  Denies nausea, heartburn or change in bowel habits Skin: Denies abnormal skin rashes Lymphatics: Denies new lymphadenopathy or easy bruising Neurological:Denies numbness, tingling or new weaknesses Behavioral/Psych: Mood is stable, no new changes  All other systems were reviewed with the patient and are negative.  MEDICAL HISTORY:  Past Medical History:  Diagnosis Date  . colorectal ca dx'd 07/2018  . Liver metastases ([place]) dx'd 07/2018  . Lung metastases ([place]) dx'd 07/2018    SURGICAL HISTORY: Past Surgical History:  Procedure Laterality Date  . COLONIC STENT PLACEMENT N/A 01/19/2019   Procedure: COLONIC STENT PLACEMENT;  Surgeon: Carol Ada, MD;  Location: [facility];  Service: Endoscopy;  Laterality: N/A;  . FLEXIBLE SIGMOIDOSCOPY N/A 01/19/2019   Procedure: FLEXIBLE SIGMOIDOSCOPY;  Surgeon: Carol Ada, MD;  Location: [facility];  Service: Endoscopy;  Laterality: N/A;  . IR IMAGING GUIDED PORT INSERTION  08/16/2018  . IR US GUIDE BX ASP/DRAIN  08/16/2018    I have reviewed the social history and family history with the patient and they are unchanged from previous note.  ALLERGIES:  has No Known Allergies.  MEDICATIONS:  Current Outpatient Medications  Medication Sig Dispense Refill  . acetaminophen (TYLENOL) 325 MG tablet Take 650 mg by mouth every 6 (six) hours as needed for moderate pain.    . diphenoxylate-atropine (LOMOTIL) 2.5-0.025 MG tablet Take 1-2  tablets by mouth 4 (four) times daily as needed for diarrhea or loose stools. Up to 8 tabs daily for severe diarrhea 60 tablet 2  . loperamide (IMODIUM A-D) 2 MG tablet Take 2 mg by mouth 4 (four) times daily as needed for diarrhea or loose stools.    . magnesium oxide (MAGNESIUM-OXIDE) 400 (241.3 Mg) MG tablet Take 0.5 tablets (200 mg total) by mouth 2 (two) times daily. 30 tablet 1  . ondansetron (ZOFRAN-ODT) 4 MG disintegrating tablet Take 4 mg by mouth as needed for nausea or vomiting.    Marland Kitchen oxyCODONE (OXY IR/ROXICODONE) 5 MG immediate release tablet Take 1-2 tablets (5-10 mg total) by mouth every 8 (eight) hours as needed for severe pain. 84 tablet 0  . potassium chloride SA (KLOR-CON) 20 MEQ tablet Take 20 mEq by mouth daily.    . regorafenib (STIVARGA) 40 MG tablet Take 2 tablets (80 mg total) by mouth daily with breakfast. Take with low fat meal. Take for 21 days on, 7 days off, repeat every 28 days. 42 tablet 0  . Rivaroxaban (XARELTO) 15 MG TABS tablet Take 1 tablet (15 mg total) by mouth 2 (two) times daily with a meal. 30 tablet 1  . VITAMIN E PO Take 1 tablet by mouth daily.    Marland Kitchen morphine (MS CONTIN) 30 MG 12 hr tablet Take 1 tablet (30 mg total) by mouth every 12 (twelve) hours. 60 tablet 0   Current Facility-Administered Medications  Medication Dose Route Frequency Provider Last Rate Last Admin  . 0.9 %  sodium  chloride infusion   Intravenous Continuous Truitt Merle, MD        PHYSICAL EXAMINATION: ECOG PERFORMANCE STATUS: 2 - Symptomatic, <50% confined to bed  Vitals:   09/21/19 1028  BP: 122/87  Pulse: (!) 109  Resp: 18  Temp: 98.2 F (36.8 C)  SpO2: 100%   Filed Weights   09/21/19 1028  Weight: 216 lb 8 oz (98.2 kg)    GENERAL:alert, no distress and comfortable SKIN: skin color, texture, turgor are normal, no rashes or significant lesions EYES: normal, Conjunctiva are pink and non-injected, sclera clear NECK: supple, thyroid normal size, non-tender, without  nodularity LYMPH:  no palpable lymphadenopathy in the cervical, axillary  LUNGS: clear to auscultation and percussion with normal breathing effort HEART: regular rate & rhythm and no murmurs and no lower extremity edema ABDOMEN:abdomen soft, non-tender and normal bowel sounds Musculoskeletal:no cyanosis of digits and no clubbing  NEURO: alert & oriented x 3 with fluent speech, no focal motor/sensory deficits  LABORATORY DATA:  I have reviewed the data as listed CBC Latest Ref Rng & Units 09/21/2019 09/07/2019 08/23/2019  WBC 4.0 - 10.5 K/uL 12.2(H) 10.3 10.8(H)  Hemoglobin 13.0 - 17.0 g/dL 9.4(L) 7.6(L) 6.6(LL)  Hematocrit 39.0 - 52.0 % 31.0(L) 25.7(L) 21.6(L)  Platelets 150 - 400 K/uL 388 285 90(L)     CMP Latest Ref Rng & Units 09/21/2019 09/07/2019 08/23/2019  Glucose 70 - 99 mg/dL 119(H) 128(H) 114(H)  BUN 8 - 23 mg/dL 13 16 18   Creatinine 0.61 - 1.24 mg/dL 0.80 0.83 0.86  Sodium 135 - 145 mmol/L 141 141 138  Potassium 3.5 - 5.1 mmol/L 4.4 4.1 4.2  Chloride 98 - 111 mmol/L 106 110 108  CO2 22 - 32 mmol/L 21(L) 21(L) 19(L)  Calcium 8.9 - 10.3 mg/dL 9.1 8.7(L) 8.1(L)  Total Protein 6.5 - 8.1 g/dL 7.7 7.0 6.1(L)  Total Bilirubin 0.3 - 1.2 mg/dL 0.4 0.3 0.3  Alkaline Phos 38 - 126 U/L 760(H) 426(H) 375(H)  AST 15 - 41 U/L 50(H) 27 22  ALT 0 - 44 U/L 51(H) 35 31      RADIOGRAPHIC STUDIES: I have personally reviewed the radiological images as listed and agreed with the findings in the report. No results found.   ASSESSMENT & PLAN:  Billy Mendoza is a 68 y.o. male with   1. Proximal rectal adenocarcinoma, metastatic to liver,lungsandnodes, MSS,KRAS/NRAS wild type,BRAF V600E mutation (+) -He was diagnosed in10/2019.He progressed on first-line FOLFOXIRI and avastin and he progressed on second-line oral BRAF/MEK/EGFR inhibitors(Braftovi, Mektovi andVectibix q2weeks)which he started on 02/01/19. -HereceivedRadiationin May 2020 to his rectal tumor -Dueto previous  good response in liver,Irestarted him onIV FOLFOXIRIwith lower oxaliplatin dose due to neuropathy on 06/08/19. Due to poor tolerance this was reduced to FOLFIRI starting 06/22/19 and added Avastin with C4.   -Given multiple tiny b/l lung nodules, progression of liver and LN metastasis as seen on 08/30/19 CT CAP, I plan to change to Regorafinib (Stivarga) 26m daily 3 weeks on/1 week off starting 09/22/19 with IV Keytruda q3weeks starting soon when free drug is availalbe. His income did not qualify him for Nivolumab drug replacement.  -I again reviewed the potential side effects of Stivarga, especially fatigue, skin toxicity, diarrhea, abnormal liver function it etc.  He voiced good understanding and agrees to proceed. -he will start Regorafinib at 425mdialy for the first 3 days then take 8061maily until day 21.  - F/u in 2 weeks.   2.History ofbowel obstruction from rectal tumor -s/p stent  placement by Dr. Benson Norway on 01/19/2019,he declined loop colostomybut agree to keep surgery as last resort.  -He received a course of radiation -To avoid constipation, I again encouraged him to use prophylactic daily Miralax every 1-2 days. If no BM for 3 days he can use Magnesium Citrate. He understands. -His 08/30/19 CT shows stent is not present. This was confirmed with 09/2019 Sigmoidoscopy with Dr. Benson Norway. His obstruction by tumor is smaller now. I expressed proceeding with treatment to prevent more blockage.  -He still has mild rectal pain is still attributes to stent. He is on MS Contin 29m BID and 6 tablets of Oxycodone 564min a 24 hour period. His pain is overall controlled but still has other factors that cause irritation/discomfort.   3. Rectal bleeding -secondary to cancer and anticoagulation  -Intermittent, worse with anticoagulation -I strongly encouraged him to take prophylactic stool softener daily to avoid constipation. Continue monitoring. -Due to significant rectal bleeding, Xarelto was  reduced to 1026maily. His bleeding frequency has slowed down.   4. Iron deficiency anemia, secondary to blood loss from tumor  -He receivedinitial 2 doses ofIV Feraheme on 5/20/20and 03/02/19.  -I encouraged him to take oral prenatal vitamin with iron.  -He has been having mild rectal bleeding lately. Last blood transfusion  09/11/19.  -Given recent rectal bleeding I recommend IV iron to maintain level today (09/21/19). He is agreeable   5.History of right PE, RecentAcute saddle PE and left LE DVTin 05/2019 -His PE is likely related to his underlying metastatic colon cancer and chemotherapy, especially Avastin. -He was already on Xarelto but not consistently due to occasional epistaxis and rectal bleeding.  -His 05/2019 PE/DVT was treated with Lovenox injections BID for 2 months, then returned to Xarelto 41m19m encouraged him to continue if his rectal bleeding is mild, due to his high risk of thrombosis.Certainly if he develops severe rectal bleeding, will hold or reduce dose -Given recent mild rectal bleeding, will reduce Xarelto to 10mg46mlate 08/2019.   6. HTN  -TakesAmlodipine -BPusually controlled  7. Goal of care discussion -He is full code now -He understands his disease is incurable,and the goal of therapy is palliative to prolong his life.   Plan -I refilled MS Contin at 30mg 53mtoday -Start Regorafinib tomorrow with 40mg f35mhe first three days, then increase to 80mg fo44me rest of the cycle  -Proceed with IV iron and IVFs today  -will start him on Keytruda when the drug replacement application gets approved -Lab, flush, f/u in 2 weeks    No problem-specific Assessment & Plan notes found for this encounter.   No orders of the defined types were placed in this encounter.  All questions were answered. The patient knows to call the clinic with any problems, questions or concerns. No barriers to learning was detected. I spent 20 minutes counseling the  patient face to face. The total time spent in the appointment was 25 minutes and more than 50% was on counseling and review of test results     Bruchy Mikel FengTruitt Merle17/2020   I, Amoya BeJoslyn Devoning as scribe for Kolston Lacount FengTruitt MerleI have reviewed the above documentation for accuracy and completeness, and I agree with the above.

## 2019-09-21 ENCOUNTER — Inpatient Hospital Stay: Payer: BC Managed Care – PPO

## 2019-09-21 ENCOUNTER — Other Ambulatory Visit: Payer: Self-pay

## 2019-09-21 ENCOUNTER — Encounter: Payer: Self-pay | Admitting: Hematology

## 2019-09-21 ENCOUNTER — Inpatient Hospital Stay (HOSPITAL_BASED_OUTPATIENT_CLINIC_OR_DEPARTMENT_OTHER): Payer: BC Managed Care – PPO | Admitting: Hematology

## 2019-09-21 ENCOUNTER — Telehealth: Payer: Self-pay | Admitting: Hematology

## 2019-09-21 VITALS — HR 84

## 2019-09-21 VITALS — BP 122/87 | HR 109 | Temp 98.2°F | Resp 18 | Ht 73.0 in | Wt 216.5 lb

## 2019-09-21 DIAGNOSIS — C787 Secondary malignant neoplasm of liver and intrahepatic bile duct: Secondary | ICD-10-CM | POA: Diagnosis not present

## 2019-09-21 DIAGNOSIS — C20 Malignant neoplasm of rectum: Secondary | ICD-10-CM

## 2019-09-21 DIAGNOSIS — Z95828 Presence of other vascular implants and grafts: Secondary | ICD-10-CM

## 2019-09-21 LAB — CMP (CANCER CENTER ONLY)
ALT: 51 U/L — ABNORMAL HIGH (ref 0–44)
AST: 50 U/L — ABNORMAL HIGH (ref 15–41)
Albumin: 2.9 g/dL — ABNORMAL LOW (ref 3.5–5.0)
Alkaline Phosphatase: 760 U/L — ABNORMAL HIGH (ref 38–126)
Anion gap: 14 (ref 5–15)
BUN: 13 mg/dL (ref 8–23)
CO2: 21 mmol/L — ABNORMAL LOW (ref 22–32)
Calcium: 9.1 mg/dL (ref 8.9–10.3)
Chloride: 106 mmol/L (ref 98–111)
Creatinine: 0.8 mg/dL (ref 0.61–1.24)
GFR, Est AFR Am: >60 mL/min (ref >60)
GFR, Estimated: >60 mL/min (ref >60)
Glucose, Bld: 119 mg/dL — ABNORMAL HIGH (ref 70–99)
Potassium: 4.4 mmol/L (ref 3.5–5.1)
Sodium: 141 mmol/L (ref 135–145)
Total Bilirubin: 0.4 mg/dL (ref 0.3–1.2)
Total Protein: 7.7 g/dL (ref 6.5–8.1)

## 2019-09-21 LAB — CBC WITH DIFFERENTIAL (CANCER CENTER ONLY)
Abs Immature Granulocytes: 0.05 10*3/uL (ref 0.00–0.07)
Basophils Absolute: 0.1 10*3/uL (ref 0.0–0.1)
Basophils Relative: 0 %
Eosinophils Absolute: 0.2 10*3/uL (ref 0.0–0.5)
Eosinophils Relative: 2 %
HCT: 31 % — ABNORMAL LOW (ref 39.0–52.0)
Hemoglobin: 9.4 g/dL — ABNORMAL LOW (ref 13.0–17.0)
Immature Granulocytes: 0 %
Lymphocytes Relative: 10 %
Lymphs Abs: 1.2 10*3/uL (ref 0.7–4.0)
MCH: 30.1 pg (ref 26.0–34.0)
MCHC: 30.3 g/dL (ref 30.0–36.0)
MCV: 99.4 fL (ref 80.0–100.0)
Monocytes Absolute: 1.1 10*3/uL — ABNORMAL HIGH (ref 0.1–1.0)
Monocytes Relative: 9 %
Neutro Abs: 9.5 10*3/uL — ABNORMAL HIGH (ref 1.7–7.7)
Neutrophils Relative %: 79 %
Platelet Count: 388 10*3/uL (ref 150–400)
RBC: 3.12 MIL/uL — ABNORMAL LOW (ref 4.22–5.81)
RDW: 16.6 % — ABNORMAL HIGH (ref 11.5–15.5)
WBC Count: 12.2 10*3/uL — ABNORMAL HIGH (ref 4.0–10.5)
nRBC: 0 % (ref 0.0–0.2)

## 2019-09-21 LAB — TOTAL PROTEIN, URINE DIPSTICK: Protein, ur: NEGATIVE mg/dL

## 2019-09-21 MED ORDER — SODIUM CHLORIDE 0.9 % IV SOLN
Freq: Once | INTRAVENOUS | Status: AC
  Filled 2019-09-21: qty 250

## 2019-09-21 MED ORDER — ACETAMINOPHEN 325 MG PO TABS
ORAL_TABLET | ORAL | Status: AC
  Filled 2019-09-21: qty 2

## 2019-09-21 MED ORDER — SODIUM CHLORIDE 0.9 % IV SOLN
510.0000 mg | Freq: Once | INTRAVENOUS | Status: AC
  Administered 2019-09-21: 510 mg via INTRAVENOUS
  Filled 2019-09-21: qty 510

## 2019-09-21 MED ORDER — DIPHENHYDRAMINE HCL 25 MG PO CAPS
50.0000 mg | ORAL_CAPSULE | Freq: Once | ORAL | Status: AC
  Administered 2019-09-21: 50 mg via ORAL

## 2019-09-21 MED ORDER — HEPARIN SOD (PORK) LOCK FLUSH 100 UNIT/ML IV SOLN
500.0000 [IU] | Freq: Once | INTRAVENOUS | Status: AC | PRN
  Administered 2019-09-21: 500 [IU]
  Filled 2019-09-21: qty 5

## 2019-09-21 MED ORDER — ALTEPLASE 2 MG IJ SOLR
2.0000 mg | Freq: Once | INTRAMUSCULAR | Status: DC | PRN
  Filled 2019-09-21: qty 2

## 2019-09-21 MED ORDER — MORPHINE SULFATE ER 30 MG PO TBCR: 30.0000 mg | EXTENDED_RELEASE_TABLET | Freq: Two times a day (BID) | ORAL | 0 refills | Status: DC

## 2019-09-21 MED ORDER — SODIUM CHLORIDE 0.9% FLUSH
10.0000 mL | INTRAVENOUS | Status: DC | PRN
  Administered 2019-09-21: 10:00:00 10 mL
  Filled 2019-09-21: qty 10

## 2019-09-21 MED ORDER — SODIUM CHLORIDE 0.9 % IV SOLN
INTRAVENOUS | Status: DC
  Filled 2019-09-21: qty 250

## 2019-09-21 MED ORDER — SODIUM CHLORIDE 0.9% FLUSH
10.0000 mL | Freq: Once | INTRAVENOUS | Status: AC | PRN
  Administered 2019-09-21: 10 mL
  Filled 2019-09-21: qty 10

## 2019-09-21 MED ORDER — ACETAMINOPHEN 325 MG PO TABS
650.0000 mg | ORAL_TABLET | Freq: Once | ORAL | Status: AC
  Administered 2019-09-21: 650 mg via ORAL

## 2019-09-21 MED ORDER — OXYCODONE HCL 5 MG PO TABS: 5.0000 mg | ORAL_TABLET | Freq: Three times a day (TID) | ORAL | 0 refills | Status: DC | PRN

## 2019-09-21 MED ORDER — DIPHENHYDRAMINE HCL 25 MG PO CAPS
ORAL_CAPSULE | ORAL | Status: AC
  Filled 2019-09-21: qty 2

## 2019-09-21 NOTE — Patient Instructions (Signed)
Ferumoxytol injection What is this medicine? FERUMOXYTOL is an iron complex. Iron is used to make healthy red blood cells, which carry oxygen and nutrients throughout the body. This medicine is used to treat iron deficiency anemia. This medicine may be used for other purposes; ask your health care provider or pharmacist if you have questions. COMMON BRAND NAME(S): Feraheme What should I tell my health care provider before I take this medicine? They need to know if you have any of these conditions:  anemia not caused by low iron levels  high levels of iron in the blood  magnetic resonance imaging (MRI) test scheduled  an unusual or allergic reaction to iron, other medicines, foods, dyes, or preservatives  pregnant or trying to get pregnant  breast-feeding How should I use this medicine? This medicine is for injection into a vein. It is given by a health care professional in a hospital or clinic setting. Talk to your pediatrician regarding the use of this medicine in children. Special care may be needed. Overdosage: If you think you have taken too much of this medicine contact a poison control center or emergency room at once. NOTE: This medicine is only for you. Do not share this medicine with others. What if I miss a dose? It is important not to miss your dose. Call your doctor or health care professional if you are unable to keep an appointment. What may interact with this medicine? This medicine may interact with the following medications:  other iron products This list may not describe all possible interactions. Give your health care provider a list of all the medicines, herbs, non-prescription drugs, or dietary supplements you use. Also tell them if you smoke, drink alcohol, or use illegal drugs. Some items may interact with your medicine. What should I watch for while using this medicine? Visit your doctor or healthcare professional regularly. Tell your doctor or healthcare  professional if your symptoms do not start to get better or if they get worse. You may need blood work done while you are taking this medicine. You may need to follow a special diet. Talk to your doctor. Foods that contain iron include: whole grains/cereals, dried fruits, beans, or peas, leafy green vegetables, and organ meats (liver, kidney). What side effects may I notice from receiving this medicine? Side effects that you should report to your doctor or health care professional as soon as possible:  allergic reactions like skin rash, itching or hives, swelling of the face, lips, or tongue  breathing problems  changes in blood pressure  feeling faint or lightheaded, falls  fever or chills  flushing, sweating, or hot feelings  swelling of the ankles or feet Side effects that usually do not require medical attention (report to your doctor or health care professional if they continue or are bothersome):  diarrhea  headache  nausea, vomiting  stomach pain This list may not describe all possible side effects. Call your doctor for medical advice about side effects. You may report side effects to FDA at 1-800-FDA-1088. Where should I keep my medicine? This drug is given in a hospital or clinic and will not be stored at home. NOTE: This sheet is a summary. It may not cover all possible information. If you have questions about this medicine, talk to your doctor, pharmacist, or health care provider.  2020 Elsevier/Gold Standard (2016-11-09 20:21:10)  Dehydration, Adult  Dehydration is when there is not enough fluid or water in your body. This happens when you lose more fluids   than you take in. Dehydration can range from mild to very bad. It should be treated right away to keep it from getting very bad. Symptoms of mild dehydration may include:  Thirst.  Dry lips.  Slightly dry mouth.  Dry, warm skin.  Dizziness. Symptoms of moderate dehydration may include:  Very dry  mouth.  Muscle cramps.  Dark pee (urine). Pee may be the color of tea.  Your body making less pee.  Your eyes making fewer tears.  Heartbeat that is uneven or faster than normal (palpitations).  Headache.  Light-headedness, especially when you stand up from sitting.  Fainting (syncope). Symptoms of very bad dehydration may include:  Changes in skin, such as: ? Cold and clammy skin. ? Blotchy (mottled) or pale skin. ? Skin that does not quickly return to normal after being lightly pinched and let go (poor skin turgor).  Changes in body fluids, such as: ? Feeling very thirsty. ? Your eyes making fewer tears. ? Not sweating when body temperature is high, such as in hot weather. ? Your body making very little pee.  Changes in vital signs, such as: ? Weak pulse. ? Pulse that is more than 100 beats a minute when you are sitting still. ? Fast breathing. ? Low blood pressure.  Other changes, such as: ? Sunken eyes. ? Cold hands and feet. ? Confusion. ? Lack of energy (lethargy). ? Trouble waking up from sleep. ? Short-term weight loss. ? Unconsciousness. Follow these instructions at home:   If told by your doctor, drink an ORS: ? Make an ORS by using instructions on the package. ? Start by drinking small amounts, about  cup (120 mL) every 5-10 minutes. ? Slowly drink more until you have had the amount that your doctor said to have.  Drink enough clear fluid to keep your pee clear or pale yellow. If you were told to drink an ORS, finish the ORS first, then start slowly drinking clear fluids. Drink fluids such as: ? Water. Do not drink only water by itself. Doing that can make the salt (sodium) level in your body get too low (hyponatremia). ? Ice chips. ? Fruit juice that you have added water to (diluted). ? Low-calorie sports drinks.  Avoid: ? Alcohol. ? Drinks that have a lot of sugar. These include high-calorie sports drinks, fruit juice that does not have water  added, and soda. ? Caffeine. ? Foods that are greasy or have a lot of fat or sugar.  Take over-the-counter and prescription medicines only as told by your doctor.  Do not take salt tablets. Doing that can make the salt level in your body get too high (hypernatremia).  Eat foods that have minerals (electrolytes). Examples include bananas, oranges, potatoes, tomatoes, and spinach.  Keep all follow-up visits as told by your doctor. This is important. Contact a doctor if:  You have belly (abdominal) pain that: ? Gets worse. ? Stays in one area (localizes).  You have a rash.  You have a stiff neck.  You get angry or annoyed more easily than normal (irritability).  You are more sleepy than normal.  You have a harder time waking up than normal.  You feel: ? Weak. ? Dizzy. ? Very thirsty.  You have peed (urinated) only a small amount of very dark pee during 6-8 hours. Get help right away if:  You have symptoms of very bad dehydration.  You cannot drink fluids without throwing up (vomiting).  Your symptoms get worse with treatment.    You have a fever.  You have a very bad headache.  You are throwing up or having watery poop (diarrhea) and it: ? Gets worse. ? Does not go away.  You have blood or something green (bile) in your throw-up.  You have blood in your poop (stool). This may cause poop to look black and tarry.  You have not peed in 6-8 hours.  You pass out (faint).  Your heart rate when you are sitting still is more than 100 beats a minute.  You have trouble breathing. This information is not intended to replace advice given to you by your health care provider. Make sure you discuss any questions you have with your health care provider. Document Released: 07/18/2009 Document Revised: 09/03/2017 Document Reviewed: 11/15/2015 Elsevier Patient Education  2020 Elsevier Inc.  

## 2019-09-21 NOTE — Telephone Encounter (Signed)
Scheduled appt per 12/17 los.  Spoke with pt and he is aware of the appt date and time.

## 2019-09-21 NOTE — Progress Notes (Signed)
[employer] enrollment form signed by pt and MD. Mauricio Po, CPhT will send for processing. Kennith Center, Pharm.D., CPP 09/21/2019@12 :16 PM

## 2019-09-23 ENCOUNTER — Inpatient Hospital Stay: Payer: BC Managed Care – PPO

## 2019-09-26 ENCOUNTER — Telehealth: Payer: Self-pay

## 2019-09-26 ENCOUNTER — Other Ambulatory Visit: Payer: Self-pay

## 2019-09-26 DIAGNOSIS — C20 Malignant neoplasm of rectum: Secondary | ICD-10-CM

## 2019-09-26 MED ORDER — POTASSIUM CHLORIDE CRYS ER 20 MEQ PO TBCR: 20.0000 meq | EXTENDED_RELEASE_TABLET | Freq: Every day | ORAL | 2 refills | Status: AC

## 2019-09-26 NOTE — Telephone Encounter (Signed)
Patient calls for refill on Potassium and Oxycodone.  Oxycodone last filled 12/17 #84, he states he will not have enough to last through the holiday weekend.

## 2019-09-27 ENCOUNTER — Other Ambulatory Visit: Payer: Self-pay | Admitting: Nurse Practitioner

## 2019-09-27 DIAGNOSIS — C787 Secondary malignant neoplasm of liver and intrahepatic bile duct: Secondary | ICD-10-CM

## 2019-09-27 DIAGNOSIS — C20 Malignant neoplasm of rectum: Secondary | ICD-10-CM

## 2019-09-27 NOTE — Progress Notes (Signed)
Opened in error

## 2019-10-02 ENCOUNTER — Inpatient Hospital Stay (HOSPITAL_BASED_OUTPATIENT_CLINIC_OR_DEPARTMENT_OTHER): Payer: BC Managed Care – PPO | Admitting: Medical

## 2019-10-02 ENCOUNTER — Telehealth: Payer: Self-pay

## 2019-10-02 ENCOUNTER — Inpatient Hospital Stay: Payer: BC Managed Care – PPO

## 2019-10-02 ENCOUNTER — Other Ambulatory Visit: Payer: Self-pay | Admitting: Hematology

## 2019-10-02 ENCOUNTER — Ambulatory Visit (HOSPITAL_COMMUNITY)
Admission: RE | Admit: 2019-10-02 | Discharge: 2019-10-02 | Disposition: A | Discharge status: Home / Self Care | Payer: BC Managed Care – PPO | Source: Ambulatory Visit | Attending: Medical | Admitting: Medical

## 2019-10-02 ENCOUNTER — Other Ambulatory Visit: Payer: Self-pay

## 2019-10-02 VITALS — BP 122/94 | HR 96 | Temp 97.9°F | Resp 17 | Ht 73.0 in | Wt 208.7 lb

## 2019-10-02 DIAGNOSIS — R748 Abnormal levels of other serum enzymes: Secondary | ICD-10-CM | POA: Diagnosis not present

## 2019-10-02 DIAGNOSIS — C787 Secondary malignant neoplasm of liver and intrahepatic bile duct: Secondary | ICD-10-CM

## 2019-10-02 DIAGNOSIS — Z95828 Presence of other vascular implants and grafts: Secondary | ICD-10-CM | POA: Diagnosis not present

## 2019-10-02 DIAGNOSIS — C20 Malignant neoplasm of rectum: Secondary | ICD-10-CM

## 2019-10-02 DIAGNOSIS — E860 Dehydration: Secondary | ICD-10-CM

## 2019-10-02 DIAGNOSIS — R531 Weakness: Secondary | ICD-10-CM

## 2019-10-02 LAB — CMP (CANCER CENTER ONLY)
ALT: 49 U/L — ABNORMAL HIGH (ref 0–44)
AST: 47 U/L — ABNORMAL HIGH (ref 15–41)
Albumin: 2.7 g/dL — ABNORMAL LOW (ref 3.5–5.0)
Alkaline Phosphatase: 1062 U/L — ABNORMAL HIGH (ref 38–126)
Anion gap: 13 (ref 5–15)
BUN: 11 mg/dL (ref 8–23)
CO2: 19 mmol/L — ABNORMAL LOW (ref 22–32)
Calcium: 9.5 mg/dL (ref 8.9–10.3)
Chloride: 104 mmol/L (ref 98–111)
Creatinine: 0.81 mg/dL (ref 0.61–1.24)
GFR, Est AFR Am: >60 mL/min (ref >60)
GFR, Estimated: >60 mL/min (ref >60)
Glucose, Bld: 121 mg/dL — ABNORMAL HIGH (ref 70–99)
Potassium: 4 mmol/L (ref 3.5–5.1)
Sodium: 136 mmol/L (ref 135–145)
Total Bilirubin: 2.5 mg/dL — ABNORMAL HIGH (ref 0.3–1.2)
Total Protein: 7.8 g/dL (ref 6.5–8.1)

## 2019-10-02 LAB — CBC WITH DIFFERENTIAL (CANCER CENTER ONLY)
Abs Immature Granulocytes: 0.05 10*3/uL (ref 0.00–0.07)
Basophils Absolute: 0.1 10*3/uL (ref 0.0–0.1)
Basophils Relative: 1 %
Eosinophils Absolute: 0.2 10*3/uL (ref 0.0–0.5)
Eosinophils Relative: 2 %
HCT: 33.8 % — ABNORMAL LOW (ref 39.0–52.0)
Hemoglobin: 10.4 g/dL — ABNORMAL LOW (ref 13.0–17.0)
Immature Granulocytes: 0 %
Lymphocytes Relative: 11 %
Lymphs Abs: 1.3 10*3/uL (ref 0.7–4.0)
MCH: 30.1 pg (ref 26.0–34.0)
MCHC: 30.8 g/dL (ref 30.0–36.0)
MCV: 97.7 fL (ref 80.0–100.0)
Monocytes Absolute: 1.3 10*3/uL — ABNORMAL HIGH (ref 0.1–1.0)
Monocytes Relative: 11 %
Neutro Abs: 8.6 10*3/uL — ABNORMAL HIGH (ref 1.7–7.7)
Neutrophils Relative %: 75 %
Platelet Count: 315 10*3/uL (ref 150–400)
RBC: 3.46 MIL/uL — ABNORMAL LOW (ref 4.22–5.81)
RDW: 17.3 % — ABNORMAL HIGH (ref 11.5–15.5)
WBC Count: 11.4 10*3/uL — ABNORMAL HIGH (ref 4.0–10.5)
nRBC: 0 % (ref 0.0–0.2)

## 2019-10-02 MED ORDER — SODIUM CHLORIDE 0.9 % IV SOLN
Freq: Once | INTRAVENOUS | Status: AC
  Filled 2019-10-02: qty 250

## 2019-10-02 MED ORDER — HEPARIN SOD (PORK) LOCK FLUSH 100 UNIT/ML IV SOLN
500.0000 [IU] | Freq: Once | INTRAVENOUS | Status: DC | PRN
  Filled 2019-10-02: qty 5

## 2019-10-02 MED ORDER — SODIUM CHLORIDE 0.9% FLUSH
10.0000 mL | INTRAVENOUS | Status: DC | PRN
  Filled 2019-10-02: qty 10

## 2019-10-02 NOTE — Progress Notes (Signed)
These results were reviewed with Dr. Feng and with the patient.

## 2019-10-02 NOTE — Patient Instructions (Signed)

## 2019-10-02 NOTE — Progress Notes (Signed)
DISCONTINUE OFF PATHWAY REGIMEN - Colorectal   [id number]:Nivolumab 3 mg/kg q14 Days:   A cycle is every 14 days:     Nivolumab   **Always confirm dose/schedule in your pharmacy ordering system**  REASON: Other Reason PRIOR TREATMENT: Off Pathway: Nivolumab 3 mg/kg q14 Days TREATMENT RESPONSE: Unable to Evaluate  START ON PATHWAY REGIMEN - Colorectal     A cycle is every 28 days:     Regorafenib   **Always confirm dose/schedule in your pharmacy ordering system**  Patient Characteristics: Distant Metastases, Nonsurgical Candidate, BRAF V600 Mutation Positive (KRAS/NRAS Wild-Type), Standard Cytotoxic/Targeted Therapy, Fourth Line and Beyond Standard Cytotoxic/Targeted Therapy Tumor Location: Rectal Therapeutic Status: Distant Metastases Microsatellite/Mismatch Repair Status: MSS/pMMR BRAF Mutation Status: Mutation Positive KRAS/NRAS Mutation Status: Wild-Type (no mutation) Standard Cytotoxic/Targeted Line of Therapy: Fourth Line and Beyond Standard Cytotoxic/Targeted Therapy Intent of Therapy: Non-Curative / Palliative Intent, Discussed with Patient

## 2019-10-02 NOTE — Telephone Encounter (Signed)
Patient calls with c/o of feeling weak, thinks he needs blood and/or IVF.  Informed patient to come on in to be seen in [facility] this morning, he verbalized an understanding. Lab orders placed.

## 2019-10-02 NOTE — Progress Notes (Signed)
Pt transported via w/c to radiology dept for renal US, call back number provided to [place] staff.  Pt to return to [facility] afterwards to discuss results with PA Lucianne Lei.  Belongings secured with [facility] RN.  Pt released by MD Burr Medico before RN was able to deaccess port.  Attempted to call patient, left VM requesting that he return today or tomorrow morning to have port deaccessed.  Called patient back, pt states he deaccessed his port himself at home safely and disposed of the needle and placed bandaid over area of removal.  Denies any irritation or injury or bleeding at site.  Pt aware to continue to monitor area and to f/u as needed with any concerns.  PA Lucianne Lei & MD [facility] aware.

## 2019-10-03 ENCOUNTER — Other Ambulatory Visit: Payer: Self-pay | Admitting: Gastroenterology

## 2019-10-03 NOTE — Progress Notes (Addendum)
Symptoms Management Clinic Progress Note   Billy Mendoza [id number] 03-Jun-1951 68 y.o.  Billy Mendoza is managed by [name]  Actively treated with chemotherapy/immunotherapy/hormonal therapy: yes  Current therapy: Regorafinib  Next scheduled appointment with provider: 10/09/2019  Assessment: Plan:    Port-A-Cath in place - Plan: heparin lock flush 100 unit/mL, sodium chloride flush (NS) 0.9 % injection 10 mL  Dehydration - Plan: 0.9 %  sodium chloride infusion  Elevated liver enzymes - Plan: US Abdomen Limited  Hyperbilirubinemia - Plan: US Abdomen Limited  Rectal cancer metastasized to liver [facility])   Dehydration: Patient was given 1 L of normal saline IV today.  Elevated liver enzymes with hyperbilirubinemia: Billy Mendoza had an right upper quadrant ultrasound completed today which returned showing:  FINDINGS: Gallbladder: Gallbladder is mildly distended. No gallstones or gallbladder wall thickening or pericholecystic fluid is noted. No sonographic Murphy's sign is noted.  Common bile duct: Diameter: 7 mm calculus is noted in distal common bile duct. Distal common bile duct measures 8 mm which is mildly dilated.  Liver: At least 2 masses are noted in the liver consistent with metastatic disease; the largest measures 3 cm. Portal vein is patent on color Doppler imaging with normal direction of blood flow towards the liver.  Other: None.  IMPRESSION: 7 mm calculus is noted in distal common bile duct. Distal common bile duct is mildly dilated at 8 mm. MRCP or ERCP is recommended for further evaluation. At least 2 hepatic masses are noted suggesting metastatic disease.    Metastatic rectal cancer: Billy Mendoza is followed by [name] and has most recently been treated with Regorafinib.  He is scheduled to be seen next on 10/09/2019.   Please see After Visit Summary for patient specific instructions.  Future Appointments  Date Time Provider [place]  10/09/2019  2:00 PM [facility] LAB 6 [facility] None  10/09/2019  2:15 PM [facility] Bonnie FLUSH [facility] None  10/09/2019  2:45 PM Alla Feeling, NP [facility] None    Orders Placed This Encounter  Procedures  . US Abdomen Limited       Subjective:   Patient ID:  Billy Mendoza is a 67 y.o. (DOB [birth year]) male.  Chief Complaint:  Chief Complaint  Patient presents with  . Fatigue    HPI Billy Mendoza  Is a 68 y.o. male with a diagnosis of a metastatic rectal cancer.  He is followed by [name] and was most recently been treated with Regorafinib.  He presents to the clinic today with a report of weakness, hoarseness, dry mouth, and mild anorexia.  He denies nausea, vomiting, diarrhea, fever, cough, chest pain, shortness of breath, or a sore throat.  Medications: I have reviewed the patient's current medications.  Allergies: No Known Allergies  Past Medical History:  Diagnosis Date  . colorectal ca dx'd 07/2018  . Liver metastases ([place]) dx'd 07/2018  . Lung metastases ([place]) dx'd 07/2018    Past Surgical History:  Procedure Laterality Date  . COLONIC STENT PLACEMENT N/A 01/19/2019   Procedure: COLONIC STENT PLACEMENT;  Surgeon: Carol Ada, MD;  Location: [facility]COPY;  Service: Endoscopy;  Laterality: N/A;  . FLEXIBLE SIGMOIDOSCOPY N/A 01/19/2019   Procedure: FLEXIBLE SIGMOIDOSCOPY;  Surgeon: Carol Ada, MD;  Location: [facility] ENDOSCOPY;  Service: Endoscopy;  Laterality: N/A;  . IR IMAGING GUIDED PORT INSERTION  08/16/2018  . IR US GUIDE BX ASP/DRAIN  08/16/2018    Family History  Problem Relation Age of Onset  .  Cancer Mother 24       breast cancer  . Atrial fibrillation Father   . Heart failure Father   . Cancer Maternal Grandmother        lung cancer     Social History   Socioeconomic History  . Marital status: Married    Spouse name: Not on file  . Number of children: Not on file  . Years of education: Not on file  . Highest education level:  Not on file  Occupational History  . Not on file  Tobacco Use  . Smoking status: Former Smoker    Years: 10.00    Types: Cigarettes, Cigars  . Smokeless tobacco: Never Used  . Tobacco comment: quit in Sep 2019   Substance and Sexual Activity  . Alcohol use: Yes    Alcohol/week: 1.0 - 2.0 standard drinks    Types: 1 - 2 Shots of liquor per week  . Drug use: Never  . Sexual activity: Not on file  Other Topics Concern  . Not on file  Social History Narrative  . Not on file   Social Determinants of Health   Financial Resource Strain:   . Difficulty of Paying Living Expenses: Not on file  Food Insecurity:   . Worried About Charity fundraiser in the Last Year: Not on file  . Ran Out of Food in the Last Year: Not on file  Transportation Needs:   . Lack of Transportation (Medical): Not on file  . Lack of Transportation (Non-Medical): Not on file  Physical Activity:   . Days of Exercise per Week: Not on file  . Minutes of Exercise per Session: Not on file  Stress:   . Feeling of Stress : Not on file  Social Connections:   . Frequency of Communication with Friends and Family: Not on file  . Frequency of Social Gatherings with Friends and Family: Not on file  . Attends Religious Services: Not on file  . Active Member of Clubs or Organizations: Not on file  . Attends Archivist Meetings: Not on file  . Marital Status: Not on file  Intimate Partner Violence:   . Fear of Current or Ex-Partner: Not on file  . Emotionally Abused: Not on file  . Physically Abused: Not on file  . Sexually Abused: Not on file    Past Medical History, Surgical history, Social history, and Family history were reviewed and updated as appropriate.   Please see review of systems for further details on the patient's review from today.   Review of Systems:  Review of Systems  Constitutional: Positive for appetite change and fatigue. Negative for activity change, chills, diaphoresis and fever.    HENT: Negative for trouble swallowing and voice change.        Hoarseness Dry mouth  Respiratory: Negative for cough, chest tightness, shortness of breath and wheezing.   Cardiovascular: Negative for chest pain, palpitations and leg swelling.  Gastrointestinal: Negative for abdominal distention, abdominal pain, constipation, diarrhea, nausea and vomiting.  Genitourinary:       Dark yellow urine  Musculoskeletal: Negative for back pain and myalgias.  Neurological: Positive for weakness. Negative for dizziness, light-headedness and headaches.    Objective:   Physical Exam:  BP (!) 122/94 (BP Location: Left Arm, Patient Position: Sitting) Comment: notified nurse  Pulse 96   Temp 97.9 F (36.6 C) (Oral)   Resp 17   Ht 6\' 1"  (1.854 m)   Wt 208 lb 11.2 oz (  94.7 kg)   SpO2 100%   BMI 27.53 kg/m  ECOG: 1  Physical Exam Constitutional:      General: He is not in acute distress.    Appearance: He is not diaphoretic.  HENT:     Head: Normocephalic and atraumatic.  Eyes:     General: Scleral icterus present.  Cardiovascular:     Rate and Rhythm: Normal rate and regular rhythm.     Heart sounds: Normal heart sounds. No murmur. No friction rub. No gallop.   Pulmonary:     Effort: Pulmonary effort is normal. No respiratory distress.     Breath sounds: Normal breath sounds. No wheezing or rales.  Abdominal:     General: There is no distension.     Palpations: There is no mass.     Tenderness: There is abdominal tenderness (Mild right upper quadrant tenderness). There is no guarding or rebound.  Skin:    General: Skin is warm and dry.     Findings: No erythema or rash.  Neurological:     Mental Status: He is alert.     Coordination: Coordination normal.     Gait: Gait normal.  Psychiatric:        Mood and Affect: Mood normal.        Behavior: Behavior normal.        Thought Content: Thought content normal.        Judgment: Judgment normal.     Lab Review:     Component  Value Date/Time   NA 136 10/02/2019 1010   K 4.0 10/02/2019 1010   CL 104 10/02/2019 1010   CO2 19 (L) 10/02/2019 1010   GLUCOSE 121 (H) 10/02/2019 1010   BUN 11 10/02/2019 1010   CREATININE 0.81 10/02/2019 1010   CALCIUM 9.5 10/02/2019 1010   PROT 7.8 10/02/2019 1010   ALBUMIN 2.7 (L) 10/02/2019 1010   AST 47 (H) 10/02/2019 1010   ALT 49 (H) 10/02/2019 1010   ALKPHOS 1,062 (H) 10/02/2019 1010   BILITOT 2.5 (H) 10/02/2019 1010   GFRNONAA >60 10/02/2019 1010   GFRAA >60 10/02/2019 1010       Component Value Date/Time   WBC 11.4 (H) 10/02/2019 1010   WBC 5.6 06/02/2019 0337   RBC 3.46 (L) 10/02/2019 1010   HGB 10.4 (L) 10/02/2019 1010   HCT 33.8 (L) 10/02/2019 1010   PLT 315 10/02/2019 1010   MCV 97.7 10/02/2019 1010   MCH 30.1 10/02/2019 1010   MCHC 30.8 10/02/2019 1010   RDW 17.3 (H) 10/02/2019 1010   LYMPHSABS 1.3 10/02/2019 1010   MONOABS 1.3 (H) 10/02/2019 1010   EOSABS 0.2 10/02/2019 1010   BASOSABS 0.1 10/02/2019 1010   -------------------------------  Imaging from last 24 hours (if applicable):  Radiology interpretation: US Abdomen Limited  Result Date: 10/02/2019 CLINICAL DATA:  Elevated liver enzymes.  History of rectal cancer. EXAM: ULTRASOUND ABDOMEN LIMITED RIGHT UPPER QUADRANT COMPARISON:  August 30, 2019. FINDINGS: Gallbladder: Gallbladder is mildly distended. No gallstones or gallbladder wall thickening or pericholecystic fluid is noted. No sonographic Murphy's sign is noted. Common bile duct: Diameter: 7 mm calculus is noted in distal common bile duct. Distal common bile duct measures 8 mm which is mildly dilated. Liver: At least 2 masses are noted in the liver consistent with metastatic disease; the largest measures 3 cm. Portal vein is patent on color Doppler imaging with normal direction of blood flow towards the liver. Other: None. IMPRESSION: 7 mm calculus is noted in  distal common bile duct. Distal common bile duct is mildly dilated at 8 mm. MRCP or  ERCP is recommended for further evaluation. At least 2 hepatic masses are noted suggesting metastatic disease. Electronically Signed   By: Marijo Conception M.D.   On: 10/02/2019 13:22        This patient was seen with Dr. Burr Medico with my treatment plan reviewed with her. She expressed agreement with my medical management of this patient.  Addendum  I have seen the patient, examined him. I agree with the assessment and and plan and have edited the notes.   I have reviewed his [place] result with pt, and will contact his GI Dr. Benson Norway for ERCP ASAP to remove the stone in his CBC which is likely the cause of his hyperbilirubinemia.  Patient is in agreement with the plan.  I will hold his regorafenib for now, and reevaluate him early next week to restart.  His insurance has denied immunotherapy (nivolumab or Keytruda), and the Keytruda replacement application is in process.  Truitt Merle  10/02/2019

## 2019-10-04 ENCOUNTER — Other Ambulatory Visit (HOSPITAL_COMMUNITY)
Admission: RE | Admit: 2019-10-04 | Discharge: 2019-10-04 | Disposition: A | Discharge status: Home / Self Care | Payer: BC Managed Care – PPO | Source: Ambulatory Visit | Attending: Gastroenterology | Admitting: Gastroenterology

## 2019-10-04 DIAGNOSIS — Z20828 Contact with and (suspected) exposure to other viral communicable diseases: Secondary | ICD-10-CM | POA: Diagnosis not present

## 2019-10-04 DIAGNOSIS — Z01812 Encounter for preprocedural laboratory examination: Secondary | ICD-10-CM | POA: Diagnosis not present

## 2019-10-04 LAB — SARS CORONAVIRUS 2 (TAT 6-24 HRS): SARS Coronavirus 2: NEGATIVE

## 2019-10-05 ENCOUNTER — Telehealth: Payer: Self-pay

## 2019-10-05 ENCOUNTER — Ambulatory Visit (HOSPITAL_COMMUNITY): Payer: BC Managed Care – PPO | Admitting: Certified Registered Nurse Anesthetist

## 2019-10-05 ENCOUNTER — Ambulatory Visit (HOSPITAL_COMMUNITY): Payer: BC Managed Care – PPO

## 2019-10-05 ENCOUNTER — Encounter (HOSPITAL_COMMUNITY)
Admission: RE | Disposition: A | Discharge status: Home / Self Care | Payer: Self-pay | Source: Home / Self Care | Attending: Gastroenterology

## 2019-10-05 ENCOUNTER — Telehealth: Payer: Self-pay | Admitting: Hematology

## 2019-10-05 ENCOUNTER — Encounter (HOSPITAL_COMMUNITY): Payer: Self-pay | Admitting: Gastroenterology

## 2019-10-05 ENCOUNTER — Ambulatory Visit (HOSPITAL_COMMUNITY)
Admission: RE | Admit: 2019-10-05 | Discharge: 2019-10-05 | Disposition: A | Discharge status: Home / Self Care | Payer: BC Managed Care – PPO | Attending: Gastroenterology | Admitting: Gastroenterology

## 2019-10-05 ENCOUNTER — Other Ambulatory Visit: Payer: Self-pay

## 2019-10-05 DIAGNOSIS — K8051 Calculus of bile duct without cholangitis or cholecystitis with obstruction: Secondary | ICD-10-CM | POA: Insufficient documentation

## 2019-10-05 DIAGNOSIS — Z803 Family history of malignant neoplasm of breast: Secondary | ICD-10-CM | POA: Diagnosis not present

## 2019-10-05 DIAGNOSIS — C20 Malignant neoplasm of rectum: Secondary | ICD-10-CM | POA: Diagnosis not present

## 2019-10-05 DIAGNOSIS — Z801 Family history of malignant neoplasm of trachea, bronchus and lung: Secondary | ICD-10-CM | POA: Insufficient documentation

## 2019-10-05 DIAGNOSIS — Z8249 Family history of ischemic heart disease and other diseases of the circulatory system: Secondary | ICD-10-CM | POA: Insufficient documentation

## 2019-10-05 DIAGNOSIS — K8050 Calculus of bile duct without cholangitis or cholecystitis without obstruction: Secondary | ICD-10-CM

## 2019-10-05 DIAGNOSIS — C787 Secondary malignant neoplasm of liver and intrahepatic bile duct: Secondary | ICD-10-CM | POA: Insufficient documentation

## 2019-10-05 DIAGNOSIS — Z87891 Personal history of nicotine dependence: Secondary | ICD-10-CM | POA: Diagnosis not present

## 2019-10-05 DIAGNOSIS — C7800 Secondary malignant neoplasm of unspecified lung: Secondary | ICD-10-CM | POA: Insufficient documentation

## 2019-10-05 DIAGNOSIS — K8020 Calculus of gallbladder without cholecystitis without obstruction: Secondary | ICD-10-CM | POA: Diagnosis not present

## 2019-10-05 HISTORY — PX: EUS: SHX5427

## 2019-10-05 HISTORY — PX: ESOPHAGOGASTRODUODENOSCOPY (EGD) WITH PROPOFOL: SHX5813

## 2019-10-05 HISTORY — PX: ERCP: SHX5425

## 2019-10-05 HISTORY — PX: SPHINCTEROTOMY: SHX5544

## 2019-10-05 HISTORY — PX: BILIARY STENT PLACEMENT: SHX5538

## 2019-10-05 HISTORY — PX: REMOVAL OF STONES: SHX5545

## 2019-10-05 SURGERY — ERCP, WITH INTERVENTION IF INDICATED
Anesthesia: General

## 2019-10-05 MED ORDER — GLUCAGON HCL RDNA (DIAGNOSTIC) 1 MG IJ SOLR
INTRAMUSCULAR | Status: AC
  Filled 2019-10-05: qty 1

## 2019-10-05 MED ORDER — SODIUM CHLORIDE 0.9 % IV SOLN: INTRAVENOUS | Status: DC

## 2019-10-05 MED ORDER — SUCCINYLCHOLINE CHLORIDE 200 MG/10ML IV SOSY
PREFILLED_SYRINGE | INTRAVENOUS | Status: DC | PRN
  Administered 2019-10-05: 100 mg via INTRAVENOUS

## 2019-10-05 MED ORDER — CIPROFLOXACIN IN D5W 400 MG/200ML IV SOLN
INTRAVENOUS | Status: AC
  Filled 2019-10-05: qty 200

## 2019-10-05 MED ORDER — INDOMETHACIN 50 MG RE SUPP
RECTAL | Status: DC | PRN
  Administered 2019-10-05: 100 mg via RECTAL

## 2019-10-05 MED ORDER — CIPROFLOXACIN IN D5W 400 MG/200ML IV SOLN
INTRAVENOUS | Status: DC | PRN
  Administered 2019-10-05: 400 mg via INTRAVENOUS
  Administered 2019-10-05: 200 mg via INTRAVENOUS

## 2019-10-05 MED ORDER — ROCURONIUM BROMIDE 10 MG/ML (PF) SYRINGE
PREFILLED_SYRINGE | INTRAVENOUS | Status: DC | PRN
  Administered 2019-10-05: 50 mg via INTRAVENOUS

## 2019-10-05 MED ORDER — FENTANYL CITRATE (PF) 250 MCG/5ML IJ SOLN
INTRAMUSCULAR | Status: DC | PRN
  Administered 2019-10-05: 100 ug via INTRAVENOUS
  Administered 2019-10-05 (×2): 50 ug via INTRAVENOUS

## 2019-10-05 MED ORDER — SUGAMMADEX SODIUM 200 MG/2ML IV SOLN
INTRAVENOUS | Status: DC | PRN
  Administered 2019-10-05: 200 mg via INTRAVENOUS

## 2019-10-05 MED ORDER — LIDOCAINE 2% (20 MG/ML) 5 ML SYRINGE
INTRAMUSCULAR | Status: DC | PRN
  Administered 2019-10-05: 100 mg via INTRAVENOUS

## 2019-10-05 MED ORDER — SODIUM CHLORIDE 0.9 % IV SOLN
INTRAVENOUS | Status: DC | PRN
  Administered 2019-10-05: 35 mL

## 2019-10-05 MED ORDER — LACTATED RINGERS IV SOLN: INTRAVENOUS | Status: DC

## 2019-10-05 MED ORDER — PROPOFOL 10 MG/ML IV BOLUS
INTRAVENOUS | Status: DC | PRN
  Administered 2019-10-05: 110 mg via INTRAVENOUS

## 2019-10-05 MED ORDER — ONDANSETRON HCL 4 MG/2ML IJ SOLN
INTRAMUSCULAR | Status: DC | PRN
  Administered 2019-10-05: 4 mg via INTRAVENOUS

## 2019-10-05 MED ORDER — DEXAMETHASONE SODIUM PHOSPHATE 10 MG/ML IJ SOLN
INTRAMUSCULAR | Status: DC | PRN
  Administered 2019-10-05: 5 mg via INTRAVENOUS

## 2019-10-05 MED ORDER — INDOMETHACIN 50 MG RE SUPP
RECTAL | Status: AC
  Filled 2019-10-05: qty 2

## 2019-10-05 NOTE — Telephone Encounter (Signed)
----- Message from Truitt Merle, MD sent at 10/05/2019 11:27 AM EST ----- Regarding: RE: Keytruda assistance I am OK with that  Malachy Mood, please reschedule his appointment and let pt know, thanks  Krista Blue  ----- Message ----- From: Kandis Cocking, [facility] Sent: 10/05/2019  10:36 AM EST To: Tora Kindred, RPH, Truitt Merle, MD Subject: RE: Beryle Flock assistance                        Hi Dr. Burr Medico,  I meant to send this yesterday.  We would want to treat this pt like they are insured and wait for 2021 approval. [employer] is typically very responsive to approvals and will likely give [place] the green light early in the week. Can we move this patient to mid-week to ensure we get the application approved?  Venora Maples  ----- Message ----- From: Tora Kindred, [facility] Sent: 10/04/2019   3:34 PM EST To: Kandis Cocking, RPH Subject: Melton AlarBeryle Flock assistance                         ----- Message ----- From: Hazle Nordmann, CPhT Sent: 10/03/2019   8:08 AM EST To: Tora Kindred, RPH, Elliot Gault, # Subject: RE: Beryle Flock assistance                        Good Morning Dr. Burr Medico,  The letter you signed to [employer] stated that the treatment was a combination of Stivarga and Keytruda. This was to assist with the insurance investigation to determine that this therapy is off label.    [employer] will prioritize the enrollment  process for [id number] applications based on the treatment date. As soon as we can get a DOS, I can send the application for approval.  Thanks  Rob ----- Message ----- From: Truitt Merle, MD Sent: 10/02/2019   9:44 PM EST To: Tora Kindred, RPH, Hazle Nordmann, CPhT, # Subject: RE: Beryle Flock assistance                        I have entered the [facility] treatment plan.   Rob, I write a letter for his Beryle Flock assistance application, I thought the letter is replacing the insurance denial letter.   If we have enough indigent supply of Keytruda, can we start him early next week? If yes, I will send a schedule  message.    saw him today and he has developed jaundice, probably related to a small CBD stone. I am contacting his GI. I will hold treatment this week.  Thanks  Krista Blue  ----- Message ----- From: Elliot Gault Sent: 10/02/2019   4:19 PM EST To: Tora Kindred, RPH, Hazle Nordmann, CPhT, # Subject: RE: Beryle Flock assistance                        Dr. Burr Medico - can you enter the tx plan? We need an order for the [facility]. Then we can submit to the ins co. If they deny the request, we will then submit it to the drug co for assistance.   Thank you  Brandi ----- Message ----- From: Tora Kindred, [facility] Sent: 10/02/2019   4:09 PM EST To: Hazle Nordmann, CPhT, Truitt Merle, MD, # Subject: Melton AlarBeryle Flock assistance  PA team,  Has the PA for Keytruda been submitted? Thanks, Vergia Alcon ----- Message ----- From: Tora Kindred, [facility] Sent: 10/02/2019   3:45 PM EST To: Hazle Nordmann, CPhT, Truitt Merle, MD, # Subject: Beryle Flock assistance                            Dr. Burr Medico, We have not heard back from [employer] about the 2020 [facility] assistance for Mr. Breitbach but we have enough indigent supply of Colver if you'd like to get him started on tx before 2021.  We're also still waiting on the [id number] application.  It will be prioritized according to the number of applications that [employer] receives and they'll begin processing at the first of the year.  If you have further questions, please contact either Rob Novella Olive or [employer].  Thanks, [employer]

## 2019-10-05 NOTE — Anesthesia Preprocedure Evaluation (Addendum)
Anesthesia Evaluation  Patient identified by MRN, date of birth, ID band Patient awake    Reviewed: Allergy & Precautions, NPO status , Patient's Chart, lab work & pertinent test results  Airway Mallampati: II  TM Distance: >3 FB Neck ROM: Full    Dental no notable dental hx. (+) Teeth Intact, Dental Advisory Given   Pulmonary neg pulmonary ROS, former smoker,    Pulmonary exam normal breath sounds clear to auscultation       Cardiovascular negative cardio ROS Normal cardiovascular exam Rhythm:Regular Rate:Normal     Neuro/Psych negative neurological ROS     GI/Hepatic Neg liver ROS, Metastatic rectal Ca   Endo/Other  negative endocrine ROS  Renal/GU K+ 4.0 Cr 0.81     Musculoskeletal negative musculoskeletal ROS (+)   Abdominal   Peds  Hematology  (+) anemia , Hgb 10.4 Plt 315   Anesthesia Other Findings   Reproductive/Obstetrics                            Anesthesia Physical Anesthesia Plan  ASA: III  Anesthesia Plan: General   Post-op Pain Management:    Induction: Intravenous  PONV Risk Score and Plan: 2 and Treatment may vary due to age or medical condition, Ondansetron and Dexamethasone  Airway Management Planned: Oral ETT  Additional Equipment: None  Intra-op Plan:   Post-operative Plan: Extubation in OR  Informed Consent: I have reviewed the patients History and Physical, chart, labs and discussed the procedure including the risks, benefits and alternatives for the proposed anesthesia with the patient or authorized representative who has indicated his/her understanding and acceptance.     Dental advisory given  Plan Discussed with:   Anesthesia Plan Comments:         Anesthesia Quick Evaluation

## 2019-10-05 NOTE — Telephone Encounter (Signed)
Faxed Oral Oncology form for Stivarga to [place], fax [phone], received confirmation fax went through.

## 2019-10-05 NOTE — Anesthesia Procedure Notes (Signed)
Procedure Name: Intubation Date/Time: 10/05/2019 12:53 PM Performed by: Janace Litten, CRNA Pre-anesthesia Checklist: Patient identified, Emergency Drugs available, Suction available and Patient being monitored Patient Re-evaluated:Patient Re-evaluated prior to induction Oxygen Delivery Method: Circle System Utilized Preoxygenation: Pre-oxygenation with 100% oxygen Induction Type: IV induction Ventilation: Mask ventilation without difficulty Laryngoscope Size: Mac and 4 Grade View: Grade I Tube type: Oral Tube size: 7.5 mm Number of attempts: 1 Airway Equipment and Method: Stylet Placement Confirmation: ETT inserted through vocal cords under direct vision,  positive ETCO2 and breath sounds checked- equal and bilateral Secured at: 23 cm Tube secured with: Tape Dental Injury: Teeth and Oropharynx as per pre-operative assessment

## 2019-10-05 NOTE — Telephone Encounter (Signed)
R/s appt per 12/31 sch message - pt in hospital . Will get new appt on discharge papers. Unable to reach pt but left message with new appt date and time

## 2019-10-05 NOTE — Discharge Instructions (Signed)

## 2019-10-05 NOTE — Transfer of Care (Signed)
Immediate Anesthesia Transfer of Care Note  Patient: Billy Mendoza  Procedure(s) Performed: ENDOSCOPIC RETROGRADE CHOLANGIOPANCREATOGRAPHY (ERCP) (N/A ) SPHINCTEROTOMY REMOVAL OF STONES BILIARY STENT PLACEMENT UPPER ENDOSCOPIC ULTRASOUND (EUS) LINEAR  Patient Location: PACU  Anesthesia Type:General  Level of Consciousness: drowsy and patient cooperative  Airway & Oxygen Therapy: Patient Spontanous Breathing  Post-op Assessment: Report given to RN and Post -op Vital signs reviewed and stable  Post vital signs: Reviewed and stable  Last Vitals:  Vitals Value Taken Time  BP    Temp    Pulse 94 10/05/19 1450  Resp 14 10/05/19 1450  SpO2 96 % 10/05/19 1450  Vitals shown include unvalidated device data.  Last Pain:  Vitals:   10/05/19 1159  TempSrc: Temporal  PainSc: 0-No pain         Complications: No apparent anesthesia complications

## 2019-10-05 NOTE — Telephone Encounter (Signed)
Spoke with patient to let him know that we are waiting for [employer] assistance to start Beryle Flock should get beginning of the week, so we have moved his appointments from Monday to Friday.  He verbalized an understanding and will check my chart for times.

## 2019-10-05 NOTE — H&P (Signed)
  Billy Mendoza HPI: This is a 68 year old male with a PMH of metastatic rectal cancer.  This week he complained of some weakness and repeat blood work showed a worsening of his liver enzyme elevations and hyperbilirubinemia.  A RUQ U/S was performed and it was positive for a distal CBD stone and the CBD was mildly dilated at 8 mm.  He denies any abdominal pain or fever.  Past Medical History:  Diagnosis Date  . colorectal ca dx'd 07/2018  . Liver metastases ([place]) dx'd 07/2018  . Lung metastases ([place]) dx'd 07/2018    Past Surgical History:  Procedure Laterality Date  . COLONIC STENT PLACEMENT N/A 01/19/2019   Procedure: COLONIC STENT PLACEMENT;  Surgeon: Carol Ada, MD;  Location: [facility] END[facility]COPY;  Service: Endoscopy;  Laterality: N/A;  . FLEXIBLE SIGMOIDOSCOPY N/A 01/19/2019   Procedure: FLEXIBLE SIGMOIDOSCOPY;  Surgeon: Carol Ada, MD;  Location: [facility] ENDOSCOPY;  Service: Endoscopy;  Laterality: N/A;  . IR IMAGING GUIDED PORT INSERTION  08/16/2018  . IR US GUIDE BX ASP/DRAIN  08/16/2018    Family History  Problem Relation Age of Onset  . Cancer Mother 49       breast cancer  . Atrial fibrillation Father   . Heart failure Father   . Cancer Maternal Grandmother        lung cancer     Social History:  reports that he has quit smoking. His smoking use included cigarettes and cigars. He quit after 10.00 years of use. He has never used smokeless tobacco. He reports current alcohol use of about 1.0 - 2.0 standard drinks of alcohol per week. He reports that he does not use drugs.  Allergies: No Known Allergies  Medications: Scheduled: Continuous:  Results for orders placed or performed during the hospital encounter of 10/04/19 (from the past 24 hour(s))  SARS CORONAVIRUS 2 (TAT 6-24 HRS) Nasopharyngeal Nasopharyngeal Swab     Status: None   Collection Time: 10/04/19  8:31 AM   Specimen: Nasopharyngeal Swab  Result Value Ref Range   SARS Coronavirus 2 NEGATIVE NEGATIVE     No  results found.  ROS:  As stated above in the HPI otherwise negative.  There were no vitals taken for this visit.    PE: Gen: NAD, Alert and Oriented HEENT:  [place]/AT, EOMI Neck: Supple, no LAD Lungs: CTA Bilaterally CV: RRR without M/G/R ABM: Soft, NTND, +BS Ext: No C/C/E  Assessment/Plan: 1) Choledocholithiasis. 2) Abnormal liver enzymes. 3) Metastatic rectal cancer.  Plan: 1) ERCP with stone extraction.  Zariana Strub D 10/05/2019, 8:05 AM

## 2019-10-05 NOTE — Op Note (Signed)
[facility] Patient Name: Billy Mendoza Procedure Date : 10/05/2019 MRN: [id number] Attending MD: Carol Ada , MD Date of Birth: 17-Feb-1951 CSN: [id number] Age: 68 Admit Type: Inpatient Procedure:                ERCP Indications:              Common bile duct stone(s) Providers:                Carol Ada, MD, Elmer Ramp. Tilden Dome, RN, Lazaro Arms,                            Technician, Cherylynn Ridges, Technician Referring MD:              Medicines:                General Anesthesia Complications:            No immediate complications. Estimated Blood Loss:     Estimated blood loss: none. Procedure:                Pre-Anesthesia Assessment:                           - Prior to the procedure, a History and Physical                            was performed, and patient medications and                            allergies were reviewed. The patient's tolerance of                            previous anesthesia was also reviewed. The risks                            and benefits of the procedure and the sedation                            options and risks were discussed with the patient.                            All questions were answered, and informed consent                            was obtained. Prior Anticoagulants: The patient has                            taken Xarelto (rivaroxaban), last dose was 2 days                            prior to procedure. ASA Grade Assessment: III - A                            patient with severe systemic disease. After  reviewing the risks and benefits, the patient was                            deemed in satisfactory condition to undergo the                            procedure.                           - Sedation was administered by an anesthesia                            professional. General anesthesia was attained.                           After obtaining informed consent, the scope was                   passed under direct vision. Throughout the                            procedure, the patient's blood pressure, pulse, and                            oxygen saturations were monitored continuously. The                            [id number] Olympus duodenoscope was                            introduced through the mouth, and used to inject                            contrast into and used to inject contrast into the                            bile duct and ventral pancreatic duct. The                            [id number]) Olympus Linear EUS scope was                            introduced through the and used to inject contrast                            into. The ERCP was technically difficult and                            complex due to challenging cannulation. The patient                            tolerated the procedure well. Scope In: Scope Out: Findings:      The major papilla was normal. The bile duct was deeply cannulated with       the short-nosed traction sphincterotome. Contrast was injected. I  personally interpreted the bile duct images. There was brisk flow of       contrast through the ducts. Image quality was excellent. Contrast       extended to the hepatic ducts. The cystic duct contained filling       defect(s) thought to be a stone. The upper third of the main bile duct       contained a single segmental stenosis. A long 0.035 inch Soft Jagwire       was passed into the biliary tree. A 12 mm biliary sphincterotomy was       made with a traction (standard) sphincterotome using ERBE       electrocautery. There was no post-sphincterotomy bleeding. The biliary       tree was swept with a 12 mm balloon starting at the bifurcation. Sludge       was swept from the duct. One 8.5 Fr by 5 cm plastic stent with a single       external flap and a single internal flap was placed 4.5 cm into the       common bile duct. Bile flowed through the  stent. The stent was in good       position.      The ampulla was normal in appearance, but there was a distinct absence       of biliary drainage. Cannulation was difficult and the proximal PD was       cannulated twice. With the second attempt the wire was left in place, in       the head of the pancreatic PD. A secondary wire was employed and       cannulation of the CBD was successful. The PD wire was removed. Contrast       injection of the CBD was difficult. The distal portion of the CBD filled       and distended with contrast and there was a lack of proximal       opacification. The sphincterotome was advanced to the hepatic hilum and       contrast was injected while withdrawing the sphincterotome. The hepatic       ducts did not appear to be dilated, but there was a distinct narrowing       in the proximal segment of the CBD. A long cystic duct was identified       and there was a suspicion that a cystic duct stone was identified versus       a bend in the cystic duct giving the false impression of a filling       defect. A 12 mm sphincterotomy was created without any immediate       complications. The CBD was swept three times and some minor sludge was       removed, but there was no evidence of any stones. An occlusion       cholangiogram was performed and this helped to dilate the entire biliary       tree, which allowed for a better visulalization of the CBD. With the       distention the CBD appeared to be normal, but the distal CBD was more       dilated. Attempts to cannulated the cystic duct failed. The decision was       made to insert an 8.5 Fr x 5 cm stent and excellent biliary drainage was       achieved. There was a copious amount of  dark bile that drained. The ERCP       was then converted over to an EUS in hopes of trying to identify the       cystic duct stone. The EUS was notable for a normal gallbladder, but the       proximal CBD and cystic duct walls were  thickened. It measured       approximately 4 mm. Becuase of the stent placement, there was a       significant amount of air and artifact. It was not possible to fully       tract the CBD. Only the proximal cystic duct was visualized. Impression:               - The major papilla appeared normal.                           - A filling defect consistent with a stone was seen                            on the cholangiogram.                           - A single segmental biliary stricture was found in                            the upper third of the main bile duct. The                            stricture was indeterminate.                           - The examination was suspicious for cystic duct                            stones.                           - A biliary sphincterotomy was performed.                           - The biliary tree was swept and sludge was found.                           - One plastic stent was placed into the common bile                            duct. Recommendation:           - Patient has a contact number available for                            emergencies. The signs and symptoms of potential                            delayed complications were discussed with the  patient. Return to normal activities tomorrow.                            Written discharge instructions were provided to the                            patient.                           - Resume Xarelto in 2 days.                           - MRCP for further characterization of the CBD.                           - Stent removal or exchange in 3 months.                           - Follow liver panel.                           - Follow up in the office in one month. Procedure Code(s):        --- Professional ---                           [phone], Endoscopic retrograde                            cholangiopancreatography (ERCP); with placement of                             endoscopic stent into biliary or pancreatic duct,                            including pre- and post-dilation and guide wire                            passage, when performed, including sphincterotomy,                            when performed, each stent                           [id number], Endoscopic retrograde                            cholangiopancreatography (ERCP); with removal of                            calculi/debris from biliary/pancreatic duct(s)                           [id number], Endoscopic catheterization of the biliary                            ductal system, radiological supervision and  interpretation Diagnosis Code(s):        --- Professional ---                           K80.51, Calculus of bile duct without cholangitis                            or cholecystitis with obstruction                           R93.2, Abnormal findings on diagnostic imaging of                            liver and biliary tract CPT copyright 2019 [employer]. All rights reserved. The codes documented in this report are preliminary and upon coder review may  be revised to meet current compliance requirements. Carol Ada, MD Carol Ada, MD 10/05/2019 3:15:27 PM This report has been signed electronically. Number of Addenda: 0

## 2019-10-08 NOTE — Progress Notes (Deleted)
[place]   [phone] [phone]   Clinic Follow up Note   Patient Care Team: Merry Proud, MD as PCP - General (Internal Medicine) Truitt Merle, MD as Consulting Physician (Hematology) Carol Ada, MD as Consulting Physician (Gastroenterology) 10/08/2019  CHIEF COMPLAINT: F/u metastatic rectal cancer   SUMMARY OF ONCOLOGIC HISTORY: Oncology History Overview Note  Cancer Staging Rectal cancer metastasized to liver [facility]) Staging form: Colon and Rectum, AJCC 8th Edition - Clinical stage from 07/28/2018: Stage IVB (cTX, cNX, pM1b) - Signed by Truitt Merle, MD on 08/18/2018     Rectal cancer metastasized to liver ([place])  07/28/2018 Initial Biopsy   Biopsy 07/28/18 Final Microscopic Diagnosis A.Colon, Ascending, Polyp:  -Fragments of tubular adenoma -No high-grade dysplasia of malignancy.  B. Rectum, Mass, Polyp:  -Invasive adenocarcinoma, moderately - poorly differentiatred.    07/28/2018 Cancer Staging   Staging form: Colon and Rectum, AJCC 8th Edition - Clinical stage from 07/28/2018: Stage IVB (cTX, cNX, pM1b) - Signed by Truitt Merle, MD on 08/18/2018   08/04/2018 Initial Diagnosis   Rectal cancer metastasized to liver ([place])   08/16/2018 Pathology Results   Biospy  Diagnosis 08/16/18 Liver, needle/core biopsy - ADENOCARCINOMA. Microscopic Comment By morphology, the findings are compatible with the provided clinical history of colorectal adenocarcinoma. Results reported to [name] on 08/18/2018. Intradepartmental consultation was obtained ([name]).    08/16/2018 Miscellaneous   Foundation 1 genomic testing: MS-stable Tumor mutation burden  3/Mb K-ras/NRAS wild-type BRAF V600 E KDMSA R266Q PTEN T350f* SMAD4 loss TP53 loss     08/18/2018 Procedure   Colonoscopy by Dr. HBenson Norwayshowed malignant appearing partially obstructing tumor in the rectum, 8 cm above anal verge.   08/18/2018 - 09/07/2018 Chemotherapy   CAPOX  q3weeks with Xeloda 2007mBID 2 weeks on, 1 weeks off starting 08/18/18.  Swithced treatment due to BRAF mutation.    09/07/2018 - 12/24/2018 Chemotherapy   FOLFOXIRI and avastin every 2 weeks starting 09/07/18. Oxaliplatin held starting with cycle 8 and continue with FOLFIRI and Avastin. D/c due to disease progression.    11/04/2018 Imaging   CT CAP 11/04/18 IMPRESSION: 1. Peripheral filling defect in the right main pulmonary artery extending primarily into the right lower lobe pulmonary artery. While the peripheral location indicates some degree of chronicity of this pulmonary embolus, this is a new finding compared to 07/28/2018. The right ventricular to left ventricular ratio is 0.7, which does not specifically indicate right heart strain. 2. The malignancy appears considerably improved, with reduced size pulmonary nodules; reduced size and increased internal necrosis of the hepatic metastatic lesions; marked improvement in the thoracic adenopathy; reduced conspicuity of the rectal tumor; and significant reduction in perirectal tumor burden/adenopathy. 3. Other imaging findings of potential clinical significance: Aortic Atherosclerosis (ICD10-I70.0). Coronary atherosclerosis. Bilateral renal cysts. Small umbilical hernia contains adipose tissue.  Critical Value/emergent results were called by telephone at the time of interpretation on 11/04/2018 at 3:05 pm to Dr. YATruitt Merle who verbally acknowledged these results.   01/18/2019 Imaging   CT AP W contrast  IMPRESSION: 1. Colonic obstruction resulting from rectal carcinoma. 2. Hepatic metastatic disease which is decreased in size compared with the prior examination of 11/04/2018. 3. Multiple right middle lobe and right lower lobe pulmonary nodules most consistent with metastatic disease.   02/01/2019 - 06/01/2019 Chemotherapy   second line BRAFTOVI(encorafenib) 30028maily and MEKTOVI(binimetinib), 24m44m2h and Vectibix q2weeks  starting 02/01/19.  -[facility] due to RT  -Stopped  on 06/01/19 due to disease progression    02/13/2019 Imaging   CT Chest  IMPRESSION: Small bilateral pulmonary metastases, mildly progressive from January 2020.  Multifocal hepatic metastases, incompletely visualized, favored to be progressive from April 2020.  Aortic Atherosclerosis (ICD10-I70.0).    02/22/2019 - 03/21/2019 Radiation Therapy   -Palliative local radiation  -Holding mektovi (binimetinib) during RT -Due to significant rectal pain, diarrhea and stool incontinence, he stopped RT after 03/13/19. He was only able to finish 1/2 remianing treatments on 03/21/19.    05/31/2019 Imaging   CT CAP W contrast 05/31/19  IMPRESSION: 1. Pulmonary emboli with a saddle embolus. Critical Value/emergent results were called by telephone at the time of interpretation on 05/31/2019 at 12:03 pm to Cira Rue, NP , who verbally acknowledged these results. 2. Slight increase in perirectal soft tissue nodularity with overall worsening in pulmonary and hepatic metastatic disease. Omental nodularity appears slightly improved. 3. New mediastinal and periportal adenopathy. Aortocaval lymph node is not enlarged by CT size criteria but is new and therefore considered suspicious for metastatic involvement. 4. Aortic atherosclerosis (ICD10-170.0). Coronary artery calcification.   06/08/2019 - 09/04/2019 Chemotherapy   FOLFOXIRI q2weeks starting 06/08/19. Due to fatigue, diarrhea, nausea and low blood counts he was switched to FOLFIRI starting 06/22/19. Postponed the addition of Avastin until C4 on 08/03/19 given recent PE. Stopped 09/04/19 due to disease progression in liver.    08/30/2019 Imaging   CT CAP W Contrast 08/30/19  IMPRESSION: 1. Redemonstrated circumferential mass of the distal rectum (series 2, image 126). There has been interval removal of a previously seen rectal stent. 2. There are however newly enlarged  right perirectal lymph nodes, measuring up to 6 mm (series 2, image 121). 3. Multiple bilateral pulmonary nodules are stable or slightly increased in size, the largest index nodule in the right lower lobe measuring 1.2 x 1.2 cm (series 6, image 106), a nodule of the right middle lobe measuring 5 mm, previously 3 mm or smaller (series 6, image 89). 4. Interval enlargement of multiple, somewhat ill-defined low-attenuation liver lesions, an index lesion in the right lobe of the liver measuring 3.8 x 2.8 cm, previously 1.9 x 1.7 cm (series 2, image 47). 5. Interval enlargement of an aortocaval lymph node, measuring 1.3 x 1.1 cm, previously 1.1 x 0.8 cm (series 2, image 76). Interval enlargement in ill-defined portacaval lymph nodes, the largest measuring 2.4 x 1.3 cm, previously 1.5 x 0.9 cm (series 2, image 64). 6. Above constellation of findings is consistent with worsened metastatic disease. 7. Other chronic and incidental findings as detailed above. Aortic Atherosclerosis (ICD10-I70.0).   09/22/2019 -  Chemotherapy   Regorafinib (Stivarga) 48m daily 3 weeks on/1 week off starting 09/22/19 with IV Keytruda q3weeks starting soon   10/09/2019 -  Chemotherapy   The patient had pembrolizumab (KEYTRUDA) 200 mg in sodium chloride 0.9 % 50 mL chemo infusion, 200 mg, Intravenous, Once, 0 of 4 cycles  for chemotherapy treatment.      CURRENT THERAPY: Regorafinib (Stivarga) 881mdaily 3 weeks on/1 week off starting 09/22/19 with IV Keytruda q3weeks starting soon.   INTERVAL HISTORY: Mr. BiHeinzeturns for f/u as scheduled. He was last seen on 12/28 when he was found to have transaminitis and hyperbilirubinemia. ABD usKoreaeturned showing 7 mm distal CBD stone and mildly dilated CBD. He underwent ERCP with stone extraction per Dr. HuBenson Norwayn 12/31.    REVIEW OF SYSTEMS:   Constitutional: Denies fevers, chills or abnormal weight loss Eyes: Denies blurriness  of vision Ears, nose, mouth, throat, and  face: Denies mucositis or sore throat Respiratory: Denies cough, dyspnea or wheezes Cardiovascular: Denies palpitation, chest discomfort or lower extremity swelling Gastrointestinal:  Denies nausea, heartburn or change in bowel habits Skin: Denies abnormal skin rashes Lymphatics: Denies new lymphadenopathy or easy bruising Neurological:Denies numbness, tingling or new weaknesses Behavioral/Psych: Mood is stable, no new changes  All other systems were reviewed with the patient and are negative.  MEDICAL HISTORY:  Past Medical History:  Diagnosis Date  . colorectal ca dx'd 07/2018  . Liver metastases ([place]) dx'd 07/2018  . Lung metastases ([place]) dx'd 07/2018    SURGICAL HISTORY: Past Surgical History:  Procedure Laterality Date  . BILIARY STENT PLACEMENT  10/05/2019   Procedure: BILIARY STENT PLACEMENT;  Surgeon: Carol Ada, MD;  Location: [facility] ENDOSCOPY;  Service: Endoscopy;;  . COLONIC STENT PLACEMENT N/A 01/19/2019   Procedure: COLONIC STENT PLACEMENT;  Surgeon: Carol Ada, MD;  Location: [facility] ENDOSCOPY;  Service: Endoscopy;  Laterality: N/A;  . ERCP N/A 10/05/2019   Procedure: ENDOSCOPIC RETROGRADE CHOLANGIOPANCREATOGRAPHY (ERCP);  Surgeon: Carol Ada, MD;  Location: [place];  Service: Endoscopy;  Laterality: N/A;  . ESOPHAGOGASTRODUODENOSCOPY (EGD) WITH PROPOFOL N/A 10/05/2019   Procedure: ESOPHAGOGASTRODUODENOSCOPY (EGD) WITH PROPOFOL;  Surgeon: Carol Ada, MD;  Location: [place];  Service: Endoscopy;  Laterality: N/A;  . EUS  10/05/2019   Procedure: UPPER ENDOSCOPIC ULTRASOUND (EUS) LINEAR;  Surgeon: Carol Ada, MD;  Location: [facility] ENDOSCOPY;  Service: Endoscopy;;  . FLEXIBLE SIGMOIDOSCOPY N/A 01/19/2019   Procedure: Beryle Quant;  Surgeon: Carol Ada, MD;  Location: [facility] ENDOSCOPY;  Service: Endoscopy;  Laterality: N/A;  . IR IMAGING GUIDED PORT INSERTION  08/16/2018  . IR US GUIDE BX ASP/DRAIN  08/16/2018  . REMOVAL OF STONES  10/05/2019    Procedure: REMOVAL OF STONES;  Surgeon: Carol Ada, MD;  Location[facility] ENDOSCOPY;  Service: Endoscopy;;  . Joan Mayans  10/05/2019   Procedure: Joan Mayans;  Surgeon: Carol Ada, MD;  Location: [facility] ENDOSCOPY;  Service: Endoscopy;;    I have reviewed the social history and family history with the patient and they are unchanged from previous note.  ALLERGIES:  has No Known Allergies.  MEDICATIONS:  Current Outpatient Medications  Medication Sig Dispense Refill  . acetaminophen (TYLENOL) 325 MG tablet Take 650 mg by mouth every 4 (four) hours as needed for moderate pain.     Marland Kitchen acetaminophen-codeine (TYLENOL #3) 300-30 MG tablet Take 1 tablet by mouth every 4 (four) hours as needed for moderate pain.    . diphenoxylate-atropine (LOMOTIL) 2.5-0.025 MG tablet Take 1-2 tablets by mouth 4 (four) times daily as needed for diarrhea or loose stools. Up to 8 tabs daily for severe diarrhea 60 tablet 2  . loperamide (IMODIUM A-D) 2 MG tablet Take 2 mg by mouth 4 (four) times daily as needed for diarrhea or loose stools.    . magnesium oxide (MAGNESIUM-OXIDE) 400 (241.3 Mg) MG tablet Take 0.5 tablets (200 mg total) by mouth 2 (two) times daily. 30 tablet 1  . morphine (MS CONTIN) 30 MG 12 hr tablet Take 1 tablet (30 mg total) by mouth every 12 (twelve) hours. 60 tablet 0  . ondansetron (ZOFRAN-ODT) 8 MG disintegrating tablet Take 8 mg by mouth 2 (two) times daily as needed for nausea or vomiting.     Marland Kitchen oxyCODONE (OXY IR/ROXICODONE) 5 MG immediate release tablet Take 1-2 tablets (5-10 mg total) by mouth every 8 (eight) hours as needed for severe pain. (Patient taking differently: Take 10  mg by mouth every 8 (eight) hours as needed for severe pain. ) 84 tablet 0  . potassium chloride SA (KLOR-CON) 20 MEQ tablet Take 1 tablet (20 mEq total) by mouth daily. 30 tablet 2  . prochlorperazine (COMPAZINE) 10 MG tablet Take 10 mg by mouth every 6 (six) hours as needed for nausea or vomiting.    . Rivaroxaban  (XARELTO) 15 MG TABS tablet Take 1 tablet (15 mg total) by mouth 2 (two) times daily with a meal. (Patient taking differently: Take 10 mg by mouth daily. ) 30 tablet 1   No current facility-administered medications for this visit.   Facility-Administered Medications Ordered in Other Visits  Medication Dose Route Frequency Provider Last Rate Last Admin  . heparin lock flush 100 unit/mL  500 Units Intracatheter Once PRN Truitt Merle, MD      . sodium chloride flush (NS) 0.9 % injection 10 mL  10 mL Intracatheter PRN Truitt Merle, MD        PHYSICAL EXAMINATION: ECOG PERFORMANCE STATUS: {CHL ONC ECOG TX:[id number]}  There were no vitals filed for this visit. There were no vitals filed for this visit.  GENERAL:alert, no distress and comfortable SKIN: skin color, texture, turgor are normal, no rashes or significant lesions EYES: normal, Conjunctiva are pink and non-injected, sclera clear OROPHARYNX:no exudate, no erythema and lips, buccal mucosa, and tongue normal  NECK: supple, thyroid normal size, non-tender, without nodularity LYMPH:  no palpable lymphadenopathy in the cervical, axillary or inguinal LUNGS: clear to auscultation and percussion with normal breathing effort HEART: regular rate & rhythm and no murmurs and no lower extremity edema ABDOMEN:abdomen soft, non-tender and normal bowel sounds Musculoskeletal:no cyanosis of digits and no clubbing  NEURO: alert & oriented x 3 with fluent speech, no focal motor/sensory deficits  LABORATORY DATA:  I have reviewed the data as listed CBC Latest Ref Rng & Units 10/02/2019 09/21/2019 09/07/2019  WBC 4.0 - 10.5 K/uL 11.4(H) 12.2(H) 10.3  Hemoglobin 13.0 - 17.0 g/dL 10.4(L) 9.4(L) 7.6(L)  Hematocrit 39.0 - 52.0 % 33.8(L) 31.0(L) 25.7(L)  Platelets 150 - 400 K/uL 315 388 285     CMP Latest Ref Rng & Units 10/02/2019 09/21/2019 09/07/2019  Glucose 70 - 99 mg/dL 121(H) 119(H) 128(H)  BUN 8 - 23 mg/dL 11 13 16   Creatinine 0.61 - 1.24 mg/dL  0.81 0.80 0.83  Sodium 135 - 145 mmol/L 136 141 141  Potassium 3.5 - 5.1 mmol/L 4.0 4.4 4.1  Chloride 98 - 111 mmol/L 104 106 110  CO2 22 - 32 mmol/L 19(L) 21(L) 21(L)  Calcium 8.9 - 10.3 mg/dL 9.5 9.1 8.7(L)  Total Protein 6.5 - 8.1 g/dL 7.8 7.7 7.0  Total Bilirubin 0.3 - 1.2 mg/dL 2.5(H) 0.4 0.3  Alkaline Phos 38 - 126 U/L 1,062(H) 760(H) 426(H)  AST 15 - 41 U/L 47(H) 50(H) 27  ALT 0 - 44 U/L 49(H) 51(H) 35      RADIOGRAPHIC STUDIES: I have personally reviewed the radiological images as listed and agreed with the findings in the report. No results found.   ASSESSMENT & PLAN:  No problem-specific Assessment & Plan notes found for this encounter.   No orders of the defined types were placed in this encounter.  All questions were answered. The patient knows to call the clinic with any problems, questions or concerns. No barriers to learning was detected. I spent {CHL ONC TIME VISIT - QMGNO:[id number]} counseling the patient face to face. The total time spent in the appointment was {CHL ONC  TIME VISIT - EMVVK:[id number]} and more than 50% was on counseling and review of test results     Alla Feeling, NP 10/08/19

## 2019-10-09 ENCOUNTER — Telehealth: Payer: Self-pay | Admitting: *Deleted

## 2019-10-09 ENCOUNTER — Ambulatory Visit: Payer: BC Managed Care – PPO | Admitting: Nurse Practitioner

## 2019-10-09 ENCOUNTER — Other Ambulatory Visit: Payer: BC Managed Care – PPO

## 2019-10-09 ENCOUNTER — Ambulatory Visit: Payer: BC Managed Care – PPO

## 2019-10-09 NOTE — Telephone Encounter (Signed)
Called pt for missed appt. Pt stated, "I didn't know I had an appt today." Pt was apologetic about missed appt. Will f/u as scheduled on 10/13/19

## 2019-10-09 NOTE — Anesthesia Postprocedure Evaluation (Signed)
Anesthesia Post Note  Patient: Billy Mendoza  Procedure(s) Performed: ENDOSCOPIC RETROGRADE CHOLANGIOPANCREATOGRAPHY (ERCP) (N/A ) SPHINCTEROTOMY REMOVAL OF STONES BILIARY STENT PLACEMENT UPPER ENDOSCOPIC ULTRASOUND (EUS) LINEAR ESOPHAGOGASTRODUODENOSCOPY (EGD) WITH PROPOFOL (N/A )     Patient location during evaluation: Endoscopy Anesthesia Type: General Level of consciousness: awake and alert Pain management: pain level controlled Vital Signs Assessment: post-procedure vital signs reviewed and stable Respiratory status: spontaneous breathing, nonlabored ventilation, respiratory function stable and patient connected to nasal cannula oxygen Cardiovascular status: blood pressure returned to baseline and stable Postop Assessment: no apparent nausea or vomiting Anesthetic complications: no    Last Vitals:  Vitals:   10/05/19 1525 10/05/19 1535  BP: (!) 175/101 (!) 180/95  Pulse: 92 90  Resp: (!) 21 (!) 21  Temp:    SpO2: 95% 96%    Last Pain:  Vitals:   10/05/19 1535  TempSrc:   PainSc: 0-No pain                 Barnet Glasgow

## 2019-10-10 ENCOUNTER — Encounter: Payer: Self-pay | Admitting: Medical

## 2019-10-10 ENCOUNTER — Other Ambulatory Visit: Payer: Self-pay | Admitting: Gastroenterology

## 2019-10-10 DIAGNOSIS — R109 Unspecified abdominal pain: Secondary | ICD-10-CM

## 2019-10-11 ENCOUNTER — Other Ambulatory Visit: Payer: Self-pay | Admitting: Hematology

## 2019-10-11 ENCOUNTER — Telehealth: Payer: Self-pay

## 2019-10-11 DIAGNOSIS — C787 Secondary malignant neoplasm of liver and intrahepatic bile duct: Secondary | ICD-10-CM

## 2019-10-11 DIAGNOSIS — C20 Malignant neoplasm of rectum: Secondary | ICD-10-CM

## 2019-10-11 MED ORDER — OXYCODONE HCL 5 MG PO TABS: 10.0000 mg | ORAL_TABLET | Freq: Three times a day (TID) | ORAL | 0 refills | Status: DC | PRN

## 2019-10-11 NOTE — Telephone Encounter (Signed)
Mr. Billy Mendoza called requesting a refill for oxycodone.

## 2019-10-11 NOTE — Telephone Encounter (Signed)
Done.   Sharda Keddy MD  

## 2019-10-12 NOTE — Progress Notes (Signed)
[name]   [phone] [phone]   Clinic Follow up Note   Patient Care Team: Merry Proud, MD as PCP - General (Internal Medicine) Truitt Merle, MD as Consulting Physician (Hematology) Carol Ada, MD as Consulting Physician (Gastroenterology)  Date of Service:  10/13/2019  CHIEF COMPLAINT: F/u of rectal cancer metastasized to liver  SUMMARY OF ONCOLOGIC HISTORY: Oncology History Overview Note  Cancer Staging Rectal cancer metastasized to liver [facility]) Staging form: Colon and Rectum, AJCC 8th Edition - Clinical stage from 07/28/2018: Stage IVB (cTX, cNX, pM1b) - Signed by Truitt Merle, MD on 08/18/2018     Rectal cancer metastasized to liver ([place])  07/28/2018 Initial Biopsy   Biopsy 07/28/18 Final Microscopic Diagnosis A.Colon, Ascending, Polyp:  -Fragments of tubular adenoma -No high-grade dysplasia of malignancy.  B. Rectum, Mass, Polyp:  -Invasive adenocarcinoma, moderately - poorly differentiatred.    07/28/2018 Cancer Staging   Staging form: Colon and Rectum, AJCC 8th Edition - Clinical stage from 07/28/2018: Stage IVB (cTX, cNX, pM1b) - Signed by Truitt Merle, MD on 08/18/2018   08/04/2018 Initial Diagnosis   Rectal cancer metastasized to liver ([place])   08/16/2018 Pathology Results   Biospy  Diagnosis 08/16/18 Liver, needle/core biopsy - ADENOCARCINOMA. Microscopic Comment By morphology, the findings are compatible with the provided clinical history of colorectal adenocarcinoma. Results reported to [name] on 08/18/2018. Intradepartmental consultation was obtained ([name]).    08/16/2018 Miscellaneous   Foundation 1 genomic testing: MS-stable Tumor mutation burden  3/Mb K-ras/NRAS wild-type BRAF V600 E KDMSA R266Q PTEN T35f* SMAD4 loss TP53 loss     08/18/2018 Procedure   Colonoscopy by Dr. HBenson Norwayshowed malignant appearing partially obstructing tumor in the rectum, 8 cm above anal verge.   08/18/2018 -  09/07/2018 Chemotherapy   CAPOX q3weeks with Xeloda 20064mBID 2 weeks on, 1 weeks off starting 08/18/18.  Swithced treatment due to BRAF mutation.    09/07/2018 - 12/24/2018 Chemotherapy   FOLFOXIRI and avastin every 2 weeks starting 09/07/18. Oxaliplatin held starting with cycle 8 and continue with FOLFIRI and Avastin. D/c due to disease progression.    11/04/2018 Imaging   CT CAP 11/04/18 IMPRESSION: 1. Peripheral filling defect in the right main pulmonary artery extending primarily into the right lower lobe pulmonary artery. While the peripheral location indicates some degree of chronicity of this pulmonary embolus, this is a new finding compared to 07/28/2018. The right ventricular to left ventricular ratio is 0.7, which does not specifically indicate right heart strain. 2. The malignancy appears considerably improved, with reduced size pulmonary nodules; reduced size and increased internal necrosis of the hepatic metastatic lesions; marked improvement in the thoracic adenopathy; reduced conspicuity of the rectal tumor; and significant reduction in perirectal tumor burden/adenopathy. 3. Other imaging findings of potential clinical significance: Aortic Atherosclerosis (ICD10-I70.0). Coronary atherosclerosis. Bilateral renal cysts. Small umbilical hernia contains adipose tissue.  Critical Value/emergent results were called by telephone at the time of interpretation on 11/04/2018 at 3:05 pm to Dr. YATruitt Merle who verbally acknowledged these results.   01/18/2019 Imaging   CT AP W contrast  IMPRESSION: 1. Colonic obstruction resulting from rectal carcinoma. 2. Hepatic metastatic disease which is decreased in size compared with the prior examination of 11/04/2018. 3. Multiple right middle lobe and right lower lobe pulmonary nodules most consistent with metastatic disease.   02/01/2019 - 06/01/2019 Chemotherapy   second line BRAFTOVI(encorafenib) 3001maily and MEKTOVI(binimetinib), 48m51m12h and Vectibix q2weeks starting 02/01/19.  -He has held  Mektovi 02/19/19-04/06/19 due to RT  -Stopped on 06/01/19 due to disease progression    02/13/2019 Imaging   CT Chest  IMPRESSION: Small bilateral pulmonary metastases, mildly progressive from January 2020.  Multifocal hepatic metastases, incompletely visualized, favored to be progressive from April 2020.  Aortic Atherosclerosis (ICD10-I70.0).    02/22/2019 - 03/21/2019 Radiation Therapy   -Palliative local radiation  -Holding mektovi (binimetinib) during RT -Due to significant rectal pain, diarrhea and stool incontinence, he stopped RT after 03/13/19. He was only able to finish 1/2 remianing treatments on 03/21/19.    05/31/2019 Imaging   CT CAP W contrast 05/31/19  IMPRESSION: 1. Pulmonary emboli with a saddle embolus. Critical Value/emergent results were called by telephone at the time of interpretation on 05/31/2019 at 12:03 pm to Cira Rue, NP , who verbally acknowledged these results. 2. Slight increase in perirectal soft tissue nodularity with overall worsening in pulmonary and hepatic metastatic disease. Omental nodularity appears slightly improved. 3. New mediastinal and periportal adenopathy. Aortocaval lymph node is not enlarged by CT size criteria but is new and therefore considered suspicious for metastatic involvement. 4. Aortic atherosclerosis (ICD10-170.0). Coronary artery calcification.   06/08/2019 - 09/04/2019 Chemotherapy   FOLFOXIRI q2weeks starting 06/08/19. Due to fatigue, diarrhea, nausea and low blood counts he was switched to FOLFIRI starting 06/22/19. Postponed the addition of Avastin until C4 on 08/03/19 given recent PE. Stopped 09/04/19 due to disease progression in liver.    08/30/2019 Imaging   CT CAP W Contrast 08/30/19  IMPRESSION: 1. Redemonstrated circumferential mass of the distal rectum (series 2, image 126). There has been interval removal of a previously seen rectal stent. 2. There are  however newly enlarged right perirectal lymph nodes, measuring up to 6 mm (series 2, image 121). 3. Multiple bilateral pulmonary nodules are stable or slightly increased in size, the largest index nodule in the right lower lobe measuring 1.2 x 1.2 cm (series 6, image 106), a nodule of the right middle lobe measuring 5 mm, previously 3 mm or smaller (series 6, image 89). 4. Interval enlargement of multiple, somewhat ill-defined low-attenuation liver lesions, an index lesion in the right lobe of the liver measuring 3.8 x 2.8 cm, previously 1.9 x 1.7 cm (series 2, image 47). 5. Interval enlargement of an aortocaval lymph node, measuring 1.3 x 1.1 cm, previously 1.1 x 0.8 cm (series 2, image 76). Interval enlargement in ill-defined portacaval lymph nodes, the largest measuring 2.4 x 1.3 cm, previously 1.5 x 0.9 cm (series 2, image 64). 6. Above constellation of findings is consistent with worsened metastatic disease. 7. Other chronic and incidental findings as detailed above. Aortic Atherosclerosis (ICD10-I70.0).   09/22/2019 -  Chemotherapy   Regorafinib (Stivarga) 76m daily 3 weeks on/1 week off starting 09/22/19.           -Restarted at 435mevery other day on 10/14/19.    10/13/2019 -  Chemotherapy   IV Keytruda q3weeks starting 10/13/19       CURRENT THERAPY:  -Regorafinib (Stivarga) 8029maily 3 weeks on/1 week off starting 09/22/19. Restarted at 60m4mery other day on 10/14/19.  -IV Keytruda q3weeks starting 10/13/19   INTERVAL HISTORY:  KennRandolph Shellhammerhere for a follow up and treatment. He presents to the clinic with his wife. He underwent another stent placement. Since then his jaundice is slowly improving. He is dehydrated even though he has been drinking more water. His RUQ pain has resolved after stent placement. He is on MS Contin BID and Oxycodone  10m about 6 tabs daily. He will use tylenol if needed.  He is still also fatigued and low appetite. He is losing weight. He notes  he can only slowly go up the stairs. He is no longer working full days an may not go all days. His wife notes he does not always take his medications because he will have to sit up. He notes elevated temp and has been having drenching night sweats, no chills. He denies cough. He notes he has been hearing issues lately and less brain forgetfulness.     REVIEW OF SYSTEMS:   Constitutional: (+) Drenching night sweats (+) Fatigue/weakness (+) Low appetite, weight loss  Eyes: Denies blurriness of vision Ears, nose, mouth, throat, and face: Denies mucositis or sore throat (+) Difficulty hearing.  Respiratory: Denies cough or wheezes (+) SOB  Cardiovascular: Denies palpitation, chest discomfort or lower extremity swelling Gastrointestinal:  Denies nausea, heartburn or change in bowel habits (+) Improved rectal and abdominal pain  Skin: Denies abnormal skin rashes (+) Jaundice Lymphatics: Denies new lymphadenopathy or easy bruising Neurological:Denies numbness, tingling or new weaknesses Behavioral/Psych: Mood is stable, no new changes  All other systems were reviewed with the patient and are negative.  MEDICAL HISTORY:  Past Medical History:  Diagnosis Date  . colorectal ca dx'd 07/2018  . [place]  . Lung metastases ([place] dx'd 07/2018    SURGICAL HISTORY: Past Surgical History:  Procedure Laterality Date  . BILIARY STENT PLACEMENT  10/05/2019   Procedure: BILIARY STENT PLACEMENT;  Surgeon: HCarol Ada MD;  [facility];  Service: Endoscopy;;  . COLONIC STENT PLACEMENT N/A 01/19/2019   Procedure: COLONIC STENT PLACEMENT;  Surgeon: HCarol Ada MD;  Location: [facility] ENDOSCOPY;  Service: Endoscopy;  Laterality: N/A;  . ERCP N/A 10/05/2019   Procedure: ENDOSCOPIC RETROGRADE CHOLANGIOPANCREATOGRAPHY (ERCP);  Surgeon: HCarol Ada MD;  [place]  Service: Endoscopy;  Laterality: N/A;  . ESOPHAGOGASTRODUODENOSCOPY (EGD) WITH PROPOFOL N/A  10/05/2019   Procedure: ESOPHAGOGASTRODUODENOSCOPY (EGD) WITH PROPOFOL;  Surgeon: HCarol Ada MD;  [place]  Service: Endoscopy;  Laterality: N/A;  . EUS  10/05/2019   Procedure: UPPER ENDOSCOPIC ULTRASOUND (EUS) LINEAR;  Surgeon: HCarol Ada MD;  [facility];  Service: Endoscopy;;  . FLEXIBLE SIGMOIDOSCOPY N/A 01/19/2019   Procedure: FBeryle Quant  Surgeon: HCarol Ada MD;  Location: [facility] ENDOSCOPY;  Service: Endoscopy;  Laterality: N/A;  . IR IMAGING GUIDED PORT INSERTION  08/16/2018  . IR [place] BX ASP/DRAIN  08/16/2018  . REMOVAL OF STONES  10/05/2019   Procedure: REMOVAL OF STONES;  Surgeon: HCarol Ada MD;  [facility]  Service: Endoscopy;;  . SJoan Mayans 10/05/2019   Procedure: SJoan Mayans  Surgeon: HCarol Ada MD;  [facility]  Service: Endoscopy;;    I have reviewed the social history and family history with the patient and they are unchanged from previous note.  ALLERGIES:  has No Known Allergies.  MEDICATIONS:  Current Outpatient Medications  Medication Sig Dispense Refill  . acetaminophen (TYLENOL) 325 MG tablet Take 650 mg by mouth every 4 (four) hours as needed for moderate pain.     .Marland Kitchenacetaminophen-codeine (TYLENOL #3) 300-30 MG tablet Take 1 tablet by mouth every 4 (four) hours as needed for moderate pain.    . diphenoxylate-atropine (LOMOTIL) 2.5-0.025 MG tablet Take 1-2 tablets by mouth 4 (four) times daily as needed for diarrhea or loose stools. Up to 8 tabs daily for severe diarrhea 60 tablet 2  .  loperamide (IMODIUM A-D) 2 MG tablet Take 2 mg by mouth 4 (four) times daily as needed for diarrhea or loose stools.    . magnesium oxide (MAGNESIUM-OXIDE) 400 (241.3 Mg) MG tablet Take 0.5 tablets (200 mg total) by mouth 2 (two) times daily. 30 tablet 1  . morphine (MS CONTIN) 30 MG 12 hr tablet Take 1 tablet (30 mg total) by mouth every 12 (twelve) hours. 60 tablet 0  . ondansetron (ZOFRAN-ODT)  8 MG disintegrating tablet Take 8 mg by mouth 2 (two) times daily as needed for nausea or vomiting.     Marland Kitchen oxyCODONE (OXY IR/ROXICODONE) 5 MG immediate release tablet Take 2 tablets (10 mg total) by mouth every 8 (eight) hours as needed for severe pain. 45 tablet 0  . potassium chloride SA (KLOR-CON) 20 MEQ tablet Take 1 tablet (20 mEq total) by mouth daily. 30 tablet 2  . prochlorperazine (COMPAZINE) 10 MG tablet Take 10 mg by mouth every 6 (six) hours as needed for nausea or vomiting.    . Rivaroxaban (XARELTO) 15 MG TABS tablet Take 1 tablet (15 mg total) by mouth 2 (two) times daily with a meal. (Patient taking differently: Take 10 mg by mouth daily. ) 30 tablet 1  . mirtazapine (REMERON) 7.5 MG tablet Take 1 tablet (7.5 mg total) by mouth at bedtime. 30 tablet 0   No current facility-administered medications for this visit.   Facility-Administered Medications Ordered in Other Visits  Medication Dose Route Frequency Provider Last Rate Last Admin  . 0.9 %  sodium chloride infusion   Intravenous Continuous Truitt Merle, MD   Stopped at 10/13/19 1559  . sodium chloride flush (NS) 0.9 % injection 10 mL  10 mL Intravenous PRN Truitt Merle, MD        PHYSICAL EXAMINATION: ECOG PERFORMANCE STATUS: 3 - Symptomatic, >50% confined to bed  Vitals:   10/13/19 1316  BP: 108/71  Pulse: (!) 124  Resp: 18  Temp: 99.8 F (37.7 C)  SpO2: 99%   Filed Weights   10/13/19 1316  Weight: 200 lb 6.4 oz (90.9 kg)    Due to COVID19 we will limit examination to appearance. Patient had no complaints.  GENERAL:alert, no distress and comfortable, came in a wheelchair  SKIN: skin color normal, no rashes or significant lesions EYES: normal, Conjunctiva are pink and non-injected, sclera clear  NEURO: alert & oriented x 3 with fluent speech   LABORATORY DATA:  I have reviewed the data as listed CBC Latest Ref Rng & Units 10/13/2019 10/02/2019 09/21/2019  WBC 4.0 - 10.5 K/uL 16.1(H) 11.4(H) 12.2(H)  Hemoglobin  13.0 - 17.0 g/dL 10.2(L) 10.4(L) 9.4(L)  Hematocrit 39.0 - 52.0 % 32.9(L) 33.8(L) 31.0(L)  Platelets 150 - 400 K/uL 201 315 388     CMP Latest Ref Rng & Units 10/13/2019 10/02/2019 09/21/2019  Glucose 70 - 99 mg/dL 128(H) 121(H) 119(H)  BUN 8 - 23 mg/dL 13 11 13   Creatinine 0.61 - 1.24 mg/dL 0.87 0.81 0.80  Sodium 135 - 145 mmol/L 135 136 141  Potassium 3.5 - 5.1 mmol/L 4.2 4.0 4.4  Chloride 98 - 111 mmol/L 101 104 106  CO2 22 - 32 mmol/L 21(L) 19(L) 21(L)  Calcium 8.9 - 10.3 mg/dL 8.9 9.5 9.1  Total Protein 6.5 - 8.1 g/dL 7.1 7.8 7.7  Total Bilirubin 0.3 - 1.2 mg/dL 2.3(H) 2.5(H) 0.4  Alkaline Phos 38 - 126 U/L 3,619(H) 1,062(H) 760(H)  AST 15 - 41 U/L 1,483(HH) 47(H) 50(H)  ALT 0 -  44 U/L 616(HH) 49(H) 51(H)      RADIOGRAPHIC STUDIES: I have personally reviewed the radiological images as listed and agreed with the findings in the report. No results found.   ASSESSMENT & PLAN:  Tadarius Maland is a 69 y.o. male with     1. Proximal rectal adenocarcinoma, metastatic to liver,lungsandnodes, MSS,KRAS/NRAS wild type,BRAF V600E mutation (+) -He was diagnosed in10/2019.He progressed on first-line FOLFOXIRI and avastin and he progressed on second-line oralBRAF/MEK/EGFR inhibitors(Braftovi, Mektovi andVectibix q2weeks)which he started on 02/01/19. -HereceivedRadiationin May 2020 to his rectal tumor -Dueto previous good response in liver,Irestarted him onIV FOLFOXIRIwith lower oxaliplatin dose due to neuropathy on 06/08/19.Due to poor tolerance this was reduced to FOLFIRIstarting 06/22/19 and added Avastin with C4.  -Given multiple tiny b/l lung nodules, progression of liver and LN metastasis as seen on 08/30/19 CT CAP, I changed to Regorafinib (Stivarga) 80m daily 3 weeks on/1 week off starting 09/22/19. His income did not qualify him for Nivolumab drug replacement. we finally got Keytruda replacement approved.  -due to worsening liver function and CBD stone, I stopped  steroid the last week, she underwent ERCP with a stent placement in CBD by Dr. HBenson Norway -She has been deteriorating clinically past few weeks, he is eating very little, very fatigued, appears to be dehydrated today. -Lab reviewed he developed severe transaminitis with tbil 2.3.  He is afebrile worsening right upper quadrant abdominal pain, I do not think this is related to CBD stent placement or cholangitis. I think this is likely related to his cancer progression in liver. -With his liver failure, I do not think more cancer treatment is safe, as likely cause more side effects.  I recommend stop cancer treatment.  -I had a lengthy discussion with patient and his wife about hospice service, I recommend him to consider.  He will think about the weekend. If he does not want to do now, will see him back in a week with lab and possible IVF.    2. Dehydration, Fatigue, low appetite and weight loss  -HR 124, he has not been eating adequately and losing weight. His fatigue has been worsening. He can only go to work for a few hours some days and has to move about home.  -To stimulate appetite I discussed options of Mirtazapine, Megace or Marinol. He is willing to try Mirtazapine 7.58mfirst. He should start at night given side effects of drowsiness.  -I encouraged him to eat more calories, carbohydrates and protein. I also encouraged him to increase mobility.  -Will give IV Fluids today   3. Recent CBD stone and Jaundice  -He underwent ERCP by Dr HuBenson Norwayn 10/05/19 for CBD stent placement. Jaundice is slowly improving, tbili 2.3 today (10/13/19)  4.History ofbowel obstruction from rectal tumor -s/p stent placement by Dr. HuBenson Norwayn 01/19/2019,he declined loop colostomybut agree to keep surgery as last resort.  -Hereceived a course of radiation -To avoid constipation, Iagainencouraged him to useprophylactic dailyMiralax every 1-2 days.If no BM for 3 days he can use Magnesium Citrate. He  understands. -His 08/30/19 CT shows stent is not present. This was confirmed with 09/2019 Sigmoidoscopy with Dr. HuBenson Norway -Rectal pain remains mild. His abdominal pain much improved with CBD stent placement. He will continue MS Contin 3078mID and 6 tablets of Oxycodone 5mg79m a 24 hour period.    5. Rectal bleeding -secondary to cancerand anticoagulation -Intermittent, worse with anticoagulation -I strongly encouraged him to take prophylactic stool softener daily to avoid constipation.Continue monitoring. -Due  to significant rectal bleeding,Xarelto was reduced to 68m daily. His bleeding frequency has slowed down.   6. Iron deficiency anemia, secondaryto blood loss from tumor  -He receivedinitial 2 doses ofIV Feraheme on 5/20/20and 03/02/19. -I encouraged him to take oral prenatal vitamin with iron. -He has been having mild rectal bleeding lately. Last blood transfusion  09/11/19.  -Given recent rectal bleeding IV Feraheme was given 09/21/19.   7.History of right PE, RecentAcute saddle PE and left LE DVTin 05/2019 -His PE is likely related to his underlying metastatic colon cancer and chemotherapy, especially Avastin. -He was already on Xarelto but not consistently due to occasional epistaxis and rectal bleeding.  -His 05/2019 PE/DVT was treated withLovenoxinjections BID for 2 months, then returnedto Xarelto 282m I encouraged him to continue if his rectal bleeding is mild, due to his high risk of thrombosis.Certainly if he develops severe rectal bleeding, will hold or reduce dose -Given recent mild rectal bleeding, will reduce Xarelto to 108mn late 08/2019.   8. HTN  -TakesAmlodipine, BPusually controlled  9. Goal of care discussion -We again discussed the incurable nature of his cancer, and the overall poor prognosis, especially if he does not have good response to chemotherapy or progress on chemo -The patient understands the goal of care is palliative. -I  recommend DNR/DNI, He agreed to DNR/DNI today. Order placed.  -He understands his disease is incurable,and the goal of therapy is palliative.  Plan -I called in Mirtazapine today  -I gave him IVF with NS 1L today  -will cancel his cancer treatment due to his liver failure -I recommend hospice, he will think about it this weekend. He agreed DNR today  -will call him next Monday   No problem-specific Assessment & Plan notes found for this encounter.   No orders of the defined types were placed in this encounter.  All questions were answered. The patient knows to call the clinic with any problems, questions or concerns. No barriers to learning was detected. The total time spent in the appointment was 50 minutes.     YanTruitt MerleD 10/13/2019   I, AmoJoslyn Devonm acting as scribe for YanTruitt MerleD.   I have reviewed the above documentation for accuracy and completeness, and I agree with the above.

## 2019-10-13 ENCOUNTER — Inpatient Hospital Stay: Payer: BC Managed Care – PPO | Attending: Adult Health

## 2019-10-13 ENCOUNTER — Other Ambulatory Visit: Payer: Self-pay

## 2019-10-13 ENCOUNTER — Inpatient Hospital Stay: Payer: BC Managed Care – PPO

## 2019-10-13 ENCOUNTER — Inpatient Hospital Stay (HOSPITAL_BASED_OUTPATIENT_CLINIC_OR_DEPARTMENT_OTHER): Payer: BC Managed Care – PPO | Admitting: Hematology

## 2019-10-13 ENCOUNTER — Encounter: Payer: Self-pay | Admitting: Hematology

## 2019-10-13 VITALS — BP 108/71 | HR 124 | Temp 99.8°F | Resp 18 | Ht 72.0 in | Wt 200.4 lb

## 2019-10-13 DIAGNOSIS — E860 Dehydration: Secondary | ICD-10-CM

## 2019-10-13 DIAGNOSIS — C7801 Secondary malignant neoplasm of right lung: Secondary | ICD-10-CM | POA: Insufficient documentation

## 2019-10-13 DIAGNOSIS — C787 Secondary malignant neoplasm of liver and intrahepatic bile duct: Secondary | ICD-10-CM | POA: Diagnosis not present

## 2019-10-13 DIAGNOSIS — Z7901 Long term (current) use of anticoagulants: Secondary | ICD-10-CM | POA: Insufficient documentation

## 2019-10-13 DIAGNOSIS — C20 Malignant neoplasm of rectum: Secondary | ICD-10-CM | POA: Diagnosis not present

## 2019-10-13 DIAGNOSIS — Z86711 Personal history of pulmonary embolism: Secondary | ICD-10-CM | POA: Insufficient documentation

## 2019-10-13 DIAGNOSIS — C7802 Secondary malignant neoplasm of left lung: Secondary | ICD-10-CM | POA: Insufficient documentation

## 2019-10-13 DIAGNOSIS — R634 Abnormal weight loss: Secondary | ICD-10-CM | POA: Diagnosis not present

## 2019-10-13 DIAGNOSIS — R61 Generalized hyperhidrosis: Secondary | ICD-10-CM | POA: Diagnosis not present

## 2019-10-13 DIAGNOSIS — D500 Iron deficiency anemia secondary to blood loss (chronic): Secondary | ICD-10-CM | POA: Insufficient documentation

## 2019-10-13 DIAGNOSIS — I10 Essential (primary) hypertension: Secondary | ICD-10-CM | POA: Insufficient documentation

## 2019-10-13 DIAGNOSIS — Z95828 Presence of other vascular implants and grafts: Secondary | ICD-10-CM

## 2019-10-13 DIAGNOSIS — Z86718 Personal history of other venous thrombosis and embolism: Secondary | ICD-10-CM | POA: Diagnosis not present

## 2019-10-13 LAB — CMP (CANCER CENTER ONLY)
ALT: 616 U/L (ref 0–44)
AST: 1483 U/L (ref 15–41)
Albumin: 2.3 g/dL — ABNORMAL LOW (ref 3.5–5.0)
Alkaline Phosphatase: 3619 U/L — ABNORMAL HIGH (ref 38–126)
Anion gap: 13 (ref 5–15)
BUN: 13 mg/dL (ref 8–23)
CO2: 21 mmol/L — ABNORMAL LOW (ref 22–32)
Calcium: 8.9 mg/dL (ref 8.9–10.3)
Chloride: 101 mmol/L (ref 98–111)
Creatinine: 0.87 mg/dL (ref 0.61–1.24)
GFR, Est AFR Am: >60 mL/min (ref >60)
GFR, Estimated: >60 mL/min (ref >60)
Glucose, Bld: 128 mg/dL — ABNORMAL HIGH (ref 70–99)
Potassium: 4.2 mmol/L (ref 3.5–5.1)
Sodium: 135 mmol/L (ref 135–145)
Total Bilirubin: 2.3 mg/dL — ABNORMAL HIGH (ref 0.3–1.2)
Total Protein: 7.1 g/dL (ref 6.5–8.1)

## 2019-10-13 LAB — CBC WITH DIFFERENTIAL (CANCER CENTER ONLY)
Abs Immature Granulocytes: 0.17 10*3/uL — ABNORMAL HIGH (ref 0.00–0.07)
Basophils Absolute: 0 10*3/uL (ref 0.0–0.1)
Basophils Relative: 0 %
Eosinophils Absolute: 0 10*3/uL (ref 0.0–0.5)
Eosinophils Relative: 0 %
HCT: 32.9 % — ABNORMAL LOW (ref 39.0–52.0)
Hemoglobin: 10.2 g/dL — ABNORMAL LOW (ref 13.0–17.0)
Immature Granulocytes: 1 %
Lymphocytes Relative: 3 %
Lymphs Abs: 0.4 10*3/uL — ABNORMAL LOW (ref 0.7–4.0)
MCH: 29.4 pg (ref 26.0–34.0)
MCHC: 31 g/dL (ref 30.0–36.0)
MCV: 94.8 fL (ref 80.0–100.0)
Monocytes Absolute: 1.1 10*3/uL — ABNORMAL HIGH (ref 0.1–1.0)
Monocytes Relative: 7 %
Neutro Abs: 14.3 10*3/uL — ABNORMAL HIGH (ref 1.7–7.7)
Neutrophils Relative %: 89 %
Platelet Count: 201 10*3/uL (ref 150–400)
RBC: 3.47 MIL/uL — ABNORMAL LOW (ref 4.22–5.81)
RDW: 17.5 % — ABNORMAL HIGH (ref 11.5–15.5)
WBC Count: 16.1 10*3/uL — ABNORMAL HIGH (ref 4.0–10.5)
nRBC: 0 % (ref 0.0–0.2)

## 2019-10-13 LAB — CEA (IN HOUSE-CHCC): CEA (CHCC-In House): 648.16 ng/mL — ABNORMAL HIGH (ref 0.00–5.00)

## 2019-10-13 LAB — TOTAL PROTEIN, URINE DIPSTICK: Protein, ur: NEGATIVE mg/dL

## 2019-10-13 LAB — FERRITIN: Ferritin: 3758 ng/mL — ABNORMAL HIGH (ref 24–336)

## 2019-10-13 MED ORDER — MIRTAZAPINE 7.5 MG PO TABS: 7.5000 mg | ORAL_TABLET | Freq: Every day | ORAL | 0 refills | Status: AC

## 2019-10-13 MED ORDER — SODIUM CHLORIDE 0.9% FLUSH
10.0000 mL | INTRAVENOUS | Status: DC | PRN
  Administered 2019-10-13: 10 mL
  Filled 2019-10-13: qty 10

## 2019-10-13 MED ORDER — HEPARIN SOD (PORK) LOCK FLUSH 100 UNIT/ML IV SOLN
500.0000 [IU] | Freq: Once | INTRAVENOUS | Status: AC
  Administered 2019-10-13: 500 [IU] via INTRAVENOUS
  Filled 2019-10-13: qty 5

## 2019-10-13 MED ORDER — SODIUM CHLORIDE 0.9% FLUSH
10.0000 mL | INTRAVENOUS | Status: DC | PRN
  Filled 2019-10-13: qty 10

## 2019-10-13 MED ORDER — SODIUM CHLORIDE 0.9 % IV SOLN
INTRAVENOUS | Status: DC
  Filled 2019-10-13 (×2): qty 250

## 2019-10-13 NOTE — Patient Instructions (Signed)

## 2019-10-13 NOTE — Patient Instructions (Signed)

## 2019-10-13 NOTE — Progress Notes (Signed)
Lab called with Critical values: AST  1483 ALT  616  Dr. Burr Medico notified

## 2019-10-14 ENCOUNTER — Emergency Department (HOSPITAL_COMMUNITY): Payer: BC Managed Care – PPO

## 2019-10-14 ENCOUNTER — Other Ambulatory Visit: Payer: Self-pay

## 2019-10-14 ENCOUNTER — Emergency Department (HOSPITAL_COMMUNITY)
Admission: EM | Admit: 2019-10-14 | Discharge: 2019-10-15 | Disposition: A | Discharge status: Home / Self Care | Payer: BC Managed Care – PPO | Attending: Emergency Medicine | Admitting: Emergency Medicine

## 2019-10-14 ENCOUNTER — Encounter (HOSPITAL_COMMUNITY): Payer: Self-pay | Admitting: Emergency Medicine

## 2019-10-14 DIAGNOSIS — R5383 Other fatigue: Secondary | ICD-10-CM | POA: Insufficient documentation

## 2019-10-14 DIAGNOSIS — R509 Fever, unspecified: Secondary | ICD-10-CM | POA: Diagnosis not present

## 2019-10-14 DIAGNOSIS — Z87891 Personal history of nicotine dependence: Secondary | ICD-10-CM | POA: Insufficient documentation

## 2019-10-14 DIAGNOSIS — E860 Dehydration: Secondary | ICD-10-CM

## 2019-10-14 DIAGNOSIS — C7800 Secondary malignant neoplasm of unspecified lung: Secondary | ICD-10-CM | POA: Diagnosis not present

## 2019-10-14 DIAGNOSIS — Z20822 Contact with and (suspected) exposure to covid-19: Secondary | ICD-10-CM | POA: Diagnosis not present

## 2019-10-14 DIAGNOSIS — C20 Malignant neoplasm of rectum: Secondary | ICD-10-CM | POA: Diagnosis not present

## 2019-10-14 DIAGNOSIS — C189 Malignant neoplasm of colon, unspecified: Secondary | ICD-10-CM | POA: Insufficient documentation

## 2019-10-14 DIAGNOSIS — C787 Secondary malignant neoplasm of liver and intrahepatic bile duct: Secondary | ICD-10-CM | POA: Insufficient documentation

## 2019-10-14 LAB — COMPREHENSIVE METABOLIC PANEL
ALT: 322 U/L — ABNORMAL HIGH (ref 0–44)
AST: 264 U/L — ABNORMAL HIGH (ref 15–41)
Albumin: 2.2 g/dL — ABNORMAL LOW (ref 3.5–5.0)
Alkaline Phosphatase: 1932 U/L — ABNORMAL HIGH (ref 38–126)
Anion gap: 13 (ref 5–15)
BUN: 17 mg/dL (ref 8–23)
CO2: 21 mmol/L — ABNORMAL LOW (ref 22–32)
Calcium: 8.2 mg/dL — ABNORMAL LOW (ref 8.9–10.3)
Chloride: 99 mmol/L (ref 98–111)
Creatinine, Ser: 0.9 mg/dL (ref 0.61–1.24)
GFR calc Af Amer: >60 mL/min (ref >60)
GFR calc non Af Amer: >60 mL/min (ref >60)
Glucose, Bld: 104 mg/dL — ABNORMAL HIGH (ref 70–99)
Potassium: 3.6 mmol/L (ref 3.5–5.1)
Sodium: 133 mmol/L — ABNORMAL LOW (ref 135–145)
Total Bilirubin: 3.1 mg/dL — ABNORMAL HIGH (ref 0.3–1.2)
Total Protein: 6.4 g/dL — ABNORMAL LOW (ref 6.5–8.1)

## 2019-10-14 LAB — CBC WITH DIFFERENTIAL/PLATELET
Abs Immature Granulocytes: 0.12 10*3/uL — ABNORMAL HIGH (ref 0.00–0.07)
Basophils Absolute: 0 10*3/uL (ref 0.0–0.1)
Basophils Relative: 0 %
Eosinophils Absolute: 0 10*3/uL (ref 0.0–0.5)
Eosinophils Relative: 0 %
HCT: 29.6 % — ABNORMAL LOW (ref 39.0–52.0)
Hemoglobin: 9 g/dL — ABNORMAL LOW (ref 13.0–17.0)
Immature Granulocytes: 1 %
Lymphocytes Relative: 1 %
Lymphs Abs: 0.2 10*3/uL — ABNORMAL LOW (ref 0.7–4.0)
MCH: 29.7 pg (ref 26.0–34.0)
MCHC: 30.4 g/dL (ref 30.0–36.0)
MCV: 97.7 fL (ref 80.0–100.0)
Monocytes Absolute: 0.4 10*3/uL (ref 0.1–1.0)
Monocytes Relative: 3 %
Neutro Abs: 14.2 10*3/uL — ABNORMAL HIGH (ref 1.7–7.7)
Neutrophils Relative %: 95 %
Platelets: 153 10*3/uL (ref 150–400)
RBC: 3.03 MIL/uL — ABNORMAL LOW (ref 4.22–5.81)
RDW: 17.8 % — ABNORMAL HIGH (ref 11.5–15.5)
WBC: 14.9 10*3/uL — ABNORMAL HIGH (ref 4.0–10.5)
nRBC: 0 % (ref 0.0–0.2)

## 2019-10-14 LAB — PROTIME-INR
INR: 2.2 — ABNORMAL HIGH (ref 0.8–1.2)
Prothrombin Time: 24.7 seconds — ABNORMAL HIGH (ref 11.4–15.2)

## 2019-10-14 LAB — RESPIRATORY PANEL BY RT PCR (FLU A&B, COVID)
Influenza A by PCR: NEGATIVE
Influenza B by PCR: NEGATIVE
SARS Coronavirus 2 by RT PCR: NEGATIVE

## 2019-10-14 LAB — APTT: aPTT: 47 seconds — ABNORMAL HIGH (ref 24–36)

## 2019-10-14 LAB — LACTIC ACID, PLASMA: Lactic Acid, Venous: 1.7 mmol/L (ref 0.5–1.9)

## 2019-10-14 MED ORDER — PIPERACILLIN-TAZOBACTAM 3.375 G IVPB 30 MIN
3.3750 g | Freq: Once | INTRAVENOUS | Status: AC
  Administered 2019-10-14: 3.375 g via INTRAVENOUS
  Filled 2019-10-14: qty 50

## 2019-10-14 MED ORDER — MORPHINE SULFATE (PF) 4 MG/ML IV SOLN
4.0000 mg | Freq: Once | INTRAVENOUS | Status: AC
  Administered 2019-10-14: 4 mg via INTRAVENOUS
  Filled 2019-10-14: qty 1

## 2019-10-14 MED ORDER — SODIUM CHLORIDE 0.9 % IV BOLUS
1000.0000 mL | Freq: Once | INTRAVENOUS | Status: AC
  Administered 2019-10-14: 1000 mL via INTRAVENOUS

## 2019-10-14 MED ORDER — SODIUM CHLORIDE (PF) 0.9 % IJ SOLN
INTRAMUSCULAR | Status: AC
  Filled 2019-10-14: qty 50

## 2019-10-14 MED ORDER — IOHEXOL 300 MG/ML  SOLN
100.0000 mL | Freq: Once | INTRAMUSCULAR | Status: AC | PRN
  Administered 2019-10-14: 22:00:00 100 mL via INTRAVENOUS

## 2019-10-14 NOTE — ED Triage Notes (Signed)
Pt reports fatigue and fevers since yesterday. Took some acetaminophen earlier.

## 2019-10-14 NOTE — ED Provider Notes (Signed)
[facility] Emergency Department Provider Note MRN:  [id number]  Arrival date & time: 10/14/19     Chief Complaint   Fever and Fatigue   History of Present Illness   Billy Mendoza is a 69 y.o. year-old male with a history of colon cancer with metastasis to the liver presenting to the ED with chief complaint of fever and fatigue.  General malaise and fatigue today as well as fever.  Mild abdominal pain.  Denies cough, no headache or vision change, no chest pain, no shortness of breath, no dysuria.  Patient has not had chemo in 1 month and he was told yesterday that he should transition to hospice care for his cancer.  Review of Systems  A complete 10 system review of systems was obtained and all systems are negative except as noted in the HPI and PMH.   Patient's Health History    Past Medical History:  Diagnosis Date  . colorectal ca dx'd 07/2018  . Liver metastases ([place]) dx'd 07/2018  . Lung metastases ([place]) dx'd 07/2018    Past Surgical History:  Procedure Laterality Date  . BILIARY STENT PLACEMENT  10/05/2019   Procedure: BILIARY STENT PLACEMENT;  Surgeon: Carol Ada, MD;  Location: [facility] ENDOSCOPY;  Service: Endoscopy;;  . COLONIC STENT PLACEMENT N/A 01/19/2019   Procedure: COLONIC STENT PLACEMENT;  Surgeon: Carol Ada, MD;  Location: [facility] ENDOSCOPY;  Service: Endoscopy;  Laterality: N/A;  . ERCP N/A 10/05/2019   Procedure: ENDOSCOPIC RETROGRADE CHOLANGIOPANCREATOGRAPHY (ERCP);  Surgeon: Carol Ada, MD;  Location: [place];  Service: Endoscopy;  Laterality: N/A;  . ESOPHAGOGASTRODUODENOSCOPY (EGD) WITH PROPOFOL N/A 10/05/2019   Procedure: ESOPHAGOGASTRODUODENOSCOPY (EGD) WITH PROPOFOL;  Surgeon: Carol Ada, MD;  Location: [place];  Service: Endoscopy;  Laterality: N/A;  . EUS  10/05/2019   Procedure: UPPER ENDOSCOPIC ULTRASOUND (EUS) LINEAR;  Surgeon: Carol Ada, MD;  Location: [facility] ENDOSCOPY;  Service: Endoscopy;;  . FLEXIBLE  SIGMOIDOSCOPY N/A 01/19/2019   Procedure: Beryle Quant;  Surgeon: Carol Ada, MD;  Location: [facility] ENDOSCOPY;  Service: Endoscopy;  Laterality: N/A;  . IR IMAGING GUIDED PORT INSERTION  08/16/2018  . IR US GUIDE BX ASP/DRAIN  08/16/2018  . REMOVAL OF STONES  10/05/2019   Procedure: REMOVAL OF STONES;  Surgeon: Carol Ada, MD;  Location: [facility] ENDOSCOPY;  Service: Endoscopy;;  . Joan Mayans  10/05/2019   Procedure: Joan Mayans;  Surgeon: Carol Ada, MD;  Location: [facility] ENDOSCOPY;  Service: Endoscopy;;    Family History  Problem Relation Age of Onset  . Cancer Mother 50       breast cancer  . Atrial fibrillation Father   . Heart failure Father   . Cancer Maternal Grandmother        lung cancer     Social History   Socioeconomic History  . Marital status: Married    Spouse name: Not on file  . Number of children: Not on file  . Years of education: Not on file  . Highest education level: Not on file  Occupational History  . Not on file  Tobacco Use  . Smoking status: Former Smoker    Years: 10.00    Types: Cigarettes, Cigars  . Smokeless tobacco: Never Used  . Tobacco comment: quit in Sep 2019   Substance and Sexual Activity  . Alcohol use: Yes    Alcohol/week: 1.0 - 2.0 standard drinks    Types: 1 - 2 Shots of liquor per week  . Drug use: Never  . Sexual activity: Not on  file  Other Topics Concern  . Not on file  Social History Narrative  . Not on file   Social Determinants of Health   Financial Resource Strain:   . Difficulty of Paying Living Expenses: Not on file  Food Insecurity:   . Worried About Charity fundraiser in the Last Year: Not on file  . Ran Out of Food in the Last Year: Not on file  Transportation Needs:   . Lack of Transportation (Medical): Not on file  . Lack of Transportation (Non-Medical): Not on file  Physical Activity:   . Days of Exercise per Week: Not on file  . Minutes of Exercise per Session: Not on file  Stress:   .  Feeling of Stress : Not on file  Social Connections:   . Frequency of Communication with Friends and Family: Not on file  . Frequency of Social Gatherings with Friends and Family: Not on file  . Attends Religious Services: Not on file  . Active Member of Clubs or Organizations: Not on file  . Attends Archivist Meetings: Not on file  . Marital Status: Not on file  Intimate Partner Violence:   . Fear of Current or Ex-Partner: Not on file  . Emotionally Abused: Not on file  . Physically Abused: Not on file  . Sexually Abused: Not on file     Physical Exam  Vital Signs and Nursing Notes reviewed Vitals:   10/14/19 2018 10/14/19 2208  BP: 108/77 109/78  Pulse: (!) 103 85  Resp: 20 18  Temp: 99.1 F (37.3 C)   SpO2: 95% 99%    CONSTITUTIONAL: Well-appearing, NAD NEURO:  Alert and oriented x 3, no focal deficits EYES:  eyes equal and reactive ENT/NECK:  no LAD, no JVD CARDIO: Tachycardic rate, well-perfused, normal S1 and S2 PULM:  CTAB no wheezing or rhonchi GI/GU:  normal bowel sounds, non-distended, non-tender MSK/SPINE:  No gross deformities, no edema SKIN:  no rash, atraumatic PSYCH:  Appropriate speech and behavior  Diagnostic and Interventional Summary    EKG Interpretation  Date/Time:  Saturday October 14 2019 18:04:12 EST Ventricular Rate:  133 PR Interval:    QRS Duration: 78 QT Interval:  283 QTC Calculation: 421 R Axis:   75 Text Interpretation: Sinus tachycardia Ventricular premature complex Aberrant complex Low voltage, precordial leads Borderline repolarization abnormality Confirmed by Gerlene Fee [phone]) on 10/14/2019 11:08:07 PM      Labs Reviewed  COMPREHENSIVE METABOLIC PANEL - Abnormal; Notable for the following components:      Result Value   Sodium 133 (*)    CO2 21 (*)    Glucose, Bld 104 (*)    Calcium 8.2 (*)    Total Protein 6.4 (*)    Albumin 2.2 (*)    AST 264 (*)    ALT 322 (*)    Alkaline Phosphatase 1,932 (*)    Total  Bilirubin 3.1 (*)    All other components within normal limits  CBC WITH DIFFERENTIAL/PLATELET - Abnormal; Notable for the following components:   WBC 14.9 (*)    RBC 3.03 (*)    Hemoglobin 9.0 (*)    HCT 29.6 (*)    RDW 17.8 (*)    Neutro Abs 14.2 (*)    Lymphs Abs 0.2 (*)    Abs Immature Granulocytes 0.12 (*)    All other components within normal limits  APTT - Abnormal; Notable for the following components:   aPTT 47 (*)    All  other components within normal limits  PROTIME-INR - Abnormal; Notable for the following components:   Prothrombin Time 24.7 (*)    INR 2.2 (*)    All other components within normal limits  RESPIRATORY PANEL BY RT PCR (FLU A&B, COVID)  CULTURE, BLOOD (ROUTINE X 2)  CULTURE, BLOOD (ROUTINE X 2)  URINE CULTURE  LACTIC ACID, PLASMA  LACTIC ACID, PLASMA  URINALYSIS, ROUTINE W REFLEX MICROSCOPIC    CT Abdomen Pelvis W Contrast  Final Result    DG Chest Port 1 View  Final Result      Medications  sodium chloride (PF) 0.9 % injection (has no administration in time range)  sodium chloride 0.9 % bolus 1,000 mL (0 mLs Intravenous Stopped 10/14/19 2237)  piperacillin-tazobactam (ZOSYN) IVPB 3.375 g (0 g Intravenous Stopped 10/14/19 2057)  morphine 4 MG/ML injection 4 mg (4 mg Intravenous Given 10/14/19 2017)  iohexol (OMNIPAQUE) 300 MG/ML solution 100 mL (100 mLs Intravenous Contrast Given 10/14/19 2154)     Procedures  /  Critical Care .Critical Care Performed by: Maudie Flakes, MD Authorized by: Maudie Flakes, MD   Critical care provider statement:    Critical care time (minutes):  33   Critical care was necessary to treat or prevent imminent or life-threatening deterioration of the following conditions: Concern for sepsis.   Critical care was time spent personally by me on the following activities:  Discussions with consultants, evaluation of patient's response to treatment, examination of patient, ordering and performing treatments and interventions,  ordering and review of laboratory studies, ordering and review of radiographic studies, pulse oximetry, re-evaluation of patient's condition, obtaining history from patient or surrogate and review of old charts    ED Course and Medical Decision Making  I have reviewed the triage vital signs, the nursing notes, and pertinent available records from the EMR.  Pertinent labs & imaging results that were available during my care of the patient were reviewed by me and considered in my medical decision making (see below for details).     Concern for sepsis.  Tachycardic, febrile, known metastatic liver cancer.  Given the abdominal pain, question of intra-abdominal source.  Awaiting imaging, labs.  Provided empiric fluids, Zosyn.  Patient's tachycardia resolved quickly with IV fluids.  His work-up is largely reassuring, no signs of intra-abdominal infection, normal lactate, improving white count, improving LFTs.  He is feeling well and continues to request discharge.  He explains that he has not been drinking well and was likely dehydrated.  Still awaiting urinalysis.  Given patient's terminal cancer, I would like to respect his wish to return home and follow-up with his regular doctors.  If urinalysis shows infection will provide prescription for Keflex.  Signed out to oncoming provider at shift change.  Barth Kirks. Sedonia Small, MD [place] [email or web address]  Final Clinical Impressions(s) / ED Diagnoses     ICD-10-CM   1. Dehydration  E86.0   2. Fever, unspecified fever cause  R50.9     ED Discharge Orders    None       Discharge Instructions Discussed with and Provided to Patient:   Discharge Instructions   None       Maudie Flakes, MD 10/14/19 2308

## 2019-10-14 NOTE — ED Notes (Signed)
Pt family reports that pt has not had any chemo infusions for the last several weeks due to elevated liver enzymes.

## 2019-10-15 ENCOUNTER — Telehealth (HOSPITAL_BASED_OUTPATIENT_CLINIC_OR_DEPARTMENT_OTHER): Payer: Self-pay | Admitting: Emergency Medicine

## 2019-10-15 LAB — BLOOD CULTURE ID PANEL (REFLEXED)

## 2019-10-15 LAB — URINALYSIS, ROUTINE W REFLEX MICROSCOPIC
Bilirubin Urine: NEGATIVE
Glucose, UA: NEGATIVE mg/dL
Hgb urine dipstick: NEGATIVE
Ketones, ur: NEGATIVE mg/dL
Leukocytes,Ua: NEGATIVE
Nitrite: NEGATIVE
Protein, ur: NEGATIVE mg/dL
Specific Gravity, Urine: >1.046 — ABNORMAL HIGH (ref 1.005–1.030)
pH: 6 (ref 5.0–8.0)

## 2019-10-15 MED ORDER — HEPARIN SOD (PORK) LOCK FLUSH 100 UNIT/ML IV SOLN
INTRAVENOUS | Status: AC
  Filled 2019-10-15: qty 5

## 2019-10-15 NOTE — ED Provider Notes (Signed)
Patient is improved.  UA is negative. He is requesting discharge home.  He is overall well-appearing.  Will defer any antibiotics for now as there is no obvious source, but he is not septic appearing.  No signs of meningitis.   Ripley Fraise, MD 10/15/19 [phone]

## 2019-10-16 ENCOUNTER — Inpatient Hospital Stay: Payer: BC Managed Care – PPO

## 2019-10-16 ENCOUNTER — Inpatient Hospital Stay (HOSPITAL_BASED_OUTPATIENT_CLINIC_OR_DEPARTMENT_OTHER): Payer: BC Managed Care – PPO | Admitting: Medical

## 2019-10-16 ENCOUNTER — Encounter (HOSPITAL_COMMUNITY): Payer: Self-pay | Admitting: Hematology

## 2019-10-16 ENCOUNTER — Other Ambulatory Visit: Payer: Self-pay | Admitting: Emergency Medicine

## 2019-10-16 ENCOUNTER — Telehealth: Payer: Self-pay | Admitting: Hematology

## 2019-10-16 ENCOUNTER — Other Ambulatory Visit: Payer: Self-pay

## 2019-10-16 ENCOUNTER — Inpatient Hospital Stay (HOSPITAL_COMMUNITY)
Admission: AD | Admit: 2019-10-16 | Discharge: 2019-10-20 | DRG: 288 | Disposition: A | Discharge status: Home Health | Payer: BC Managed Care – PPO | Source: Ambulatory Visit | Attending: Hematology | Admitting: Hematology

## 2019-10-16 ENCOUNTER — Telehealth: Payer: Self-pay

## 2019-10-16 VITALS — BP 113/79 | HR 85 | Temp 98.7°F | Resp 18 | Ht 72.0 in | Wt 203.4 lb

## 2019-10-16 DIAGNOSIS — C787 Secondary malignant neoplasm of liver and intrahepatic bile duct: Secondary | ICD-10-CM | POA: Diagnosis present

## 2019-10-16 DIAGNOSIS — Z8249 Family history of ischemic heart disease and other diseases of the circulatory system: Secondary | ICD-10-CM | POA: Diagnosis not present

## 2019-10-16 DIAGNOSIS — Z87891 Personal history of nicotine dependence: Secondary | ICD-10-CM | POA: Diagnosis not present

## 2019-10-16 DIAGNOSIS — I38 Endocarditis, valve unspecified: Secondary | ICD-10-CM

## 2019-10-16 DIAGNOSIS — T426X5A Adverse effect of other antiepileptic and sedative-hypnotic drugs, initial encounter: Secondary | ICD-10-CM | POA: Diagnosis not present

## 2019-10-16 DIAGNOSIS — Z66 Do not resuscitate: Secondary | ICD-10-CM | POA: Diagnosis present

## 2019-10-16 DIAGNOSIS — C7802 Secondary malignant neoplasm of left lung: Secondary | ICD-10-CM | POA: Diagnosis not present

## 2019-10-16 DIAGNOSIS — B961 Klebsiella pneumoniae [K. pneumoniae] as the cause of diseases classified elsewhere: Secondary | ICD-10-CM | POA: Diagnosis present

## 2019-10-16 DIAGNOSIS — E860 Dehydration: Secondary | ICD-10-CM | POA: Diagnosis present

## 2019-10-16 DIAGNOSIS — Z86711 Personal history of pulmonary embolism: Secondary | ICD-10-CM

## 2019-10-16 DIAGNOSIS — C20 Malignant neoplasm of rectum: Secondary | ICD-10-CM

## 2019-10-16 DIAGNOSIS — I69320 Aphasia following cerebral infarction: Secondary | ICD-10-CM | POA: Diagnosis not present

## 2019-10-16 DIAGNOSIS — R61 Generalized hyperhidrosis: Secondary | ICD-10-CM | POA: Diagnosis not present

## 2019-10-16 DIAGNOSIS — Z7901 Long term (current) use of anticoagulants: Secondary | ICD-10-CM | POA: Diagnosis not present

## 2019-10-16 DIAGNOSIS — I10 Essential (primary) hypertension: Secondary | ICD-10-CM | POA: Diagnosis not present

## 2019-10-16 DIAGNOSIS — I63422 Cerebral infarction due to embolism of left anterior cerebral artery: Secondary | ICD-10-CM | POA: Diagnosis not present

## 2019-10-16 DIAGNOSIS — I69328 Other speech and language deficits following cerebral infarction: Secondary | ICD-10-CM | POA: Diagnosis not present

## 2019-10-16 DIAGNOSIS — R197 Diarrhea, unspecified: Secondary | ICD-10-CM

## 2019-10-16 DIAGNOSIS — D638 Anemia in other chronic diseases classified elsewhere: Secondary | ICD-10-CM | POA: Diagnosis present

## 2019-10-16 DIAGNOSIS — Z86718 Personal history of other venous thrombosis and embolism: Secondary | ICD-10-CM | POA: Diagnosis not present

## 2019-10-16 DIAGNOSIS — I639 Cerebral infarction, unspecified: Secondary | ICD-10-CM | POA: Diagnosis not present

## 2019-10-16 DIAGNOSIS — K7290 Hepatic failure, unspecified without coma: Secondary | ICD-10-CM | POA: Diagnosis present

## 2019-10-16 DIAGNOSIS — Z8673 Personal history of transient ischemic attack (TIA), and cerebral infarction without residual deficits: Secondary | ICD-10-CM | POA: Diagnosis not present

## 2019-10-16 DIAGNOSIS — Z803 Family history of malignant neoplasm of breast: Secondary | ICD-10-CM | POA: Diagnosis not present

## 2019-10-16 DIAGNOSIS — C7801 Secondary malignant neoplasm of right lung: Secondary | ICD-10-CM | POA: Diagnosis not present

## 2019-10-16 DIAGNOSIS — G92 Toxic encephalopathy: Secondary | ICD-10-CM | POA: Diagnosis not present

## 2019-10-16 DIAGNOSIS — K759 Inflammatory liver disease, unspecified: Secondary | ICD-10-CM | POA: Diagnosis not present

## 2019-10-16 DIAGNOSIS — D500 Iron deficiency anemia secondary to blood loss (chronic): Secondary | ICD-10-CM | POA: Diagnosis not present

## 2019-10-16 DIAGNOSIS — G4700 Insomnia, unspecified: Secondary | ICD-10-CM

## 2019-10-16 DIAGNOSIS — I330 Acute and subacute infective endocarditis: Secondary | ICD-10-CM | POA: Diagnosis not present

## 2019-10-16 DIAGNOSIS — Z95828 Presence of other vascular implants and grafts: Secondary | ICD-10-CM | POA: Diagnosis not present

## 2019-10-16 DIAGNOSIS — R410 Disorientation, unspecified: Secondary | ICD-10-CM | POA: Diagnosis not present

## 2019-10-16 DIAGNOSIS — Z452 Encounter for adjustment and management of vascular access device: Secondary | ICD-10-CM | POA: Diagnosis not present

## 2019-10-16 DIAGNOSIS — R7881 Bacteremia: Secondary | ICD-10-CM | POA: Diagnosis not present

## 2019-10-16 DIAGNOSIS — R634 Abnormal weight loss: Secondary | ICD-10-CM | POA: Diagnosis not present

## 2019-10-16 DIAGNOSIS — I340 Nonrheumatic mitral (valve) insufficiency: Secondary | ICD-10-CM | POA: Diagnosis not present

## 2019-10-16 DIAGNOSIS — B9689 Other specified bacterial agents as the cause of diseases classified elsewhere: Secondary | ICD-10-CM | POA: Diagnosis not present

## 2019-10-16 LAB — CMP (CANCER CENTER ONLY)
ALT: 165 U/L — ABNORMAL HIGH (ref 0–44)
AST: 83 U/L — ABNORMAL HIGH (ref 15–41)
Albumin: 1.9 g/dL — ABNORMAL LOW (ref 3.5–5.0)
Alkaline Phosphatase: 1701 U/L — ABNORMAL HIGH (ref 38–126)
Anion gap: 11 (ref 5–15)
BUN: 14 mg/dL (ref 8–23)
CO2: 22 mmol/L (ref 22–32)
Calcium: 7.9 mg/dL — ABNORMAL LOW (ref 8.9–10.3)
Chloride: 104 mmol/L (ref 98–111)
Creatinine: 0.64 mg/dL (ref 0.61–1.24)
GFR, Est AFR Am: >60 mL/min (ref >60)
GFR, Estimated: >60 mL/min (ref >60)
Glucose, Bld: 111 mg/dL — ABNORMAL HIGH (ref 70–99)
Potassium: 3.6 mmol/L (ref 3.5–5.1)
Sodium: 137 mmol/L (ref 135–145)
Total Bilirubin: 1.1 mg/dL (ref 0.3–1.2)
Total Protein: 6.3 g/dL — ABNORMAL LOW (ref 6.5–8.1)

## 2019-10-16 LAB — URINE CULTURE: Culture: NO GROWTH

## 2019-10-16 LAB — CBC WITH DIFFERENTIAL (CANCER CENTER ONLY)
Abs Immature Granulocytes: 0.07 10*3/uL (ref 0.00–0.07)
Basophils Absolute: 0 10*3/uL (ref 0.0–0.1)
Basophils Relative: 0 %
Eosinophils Absolute: 0.1 10*3/uL (ref 0.0–0.5)
Eosinophils Relative: 1 %
HCT: 29.1 % — ABNORMAL LOW (ref 39.0–52.0)
Hemoglobin: 9.2 g/dL — ABNORMAL LOW (ref 13.0–17.0)
Immature Granulocytes: 1 %
Lymphocytes Relative: 8 %
Lymphs Abs: 0.8 10*3/uL (ref 0.7–4.0)
MCH: 30 pg (ref 26.0–34.0)
MCHC: 31.6 g/dL (ref 30.0–36.0)
MCV: 94.8 fL (ref 80.0–100.0)
Monocytes Absolute: 0.9 10*3/uL (ref 0.1–1.0)
Monocytes Relative: 9 %
Neutro Abs: 8.2 10*3/uL — ABNORMAL HIGH (ref 1.7–7.7)
Neutrophils Relative %: 81 %
Platelet Count: 121 10*3/uL — ABNORMAL LOW (ref 150–400)
RBC: 3.07 MIL/uL — ABNORMAL LOW (ref 4.22–5.81)
RDW: 17.7 % — ABNORMAL HIGH (ref 11.5–15.5)
WBC Count: 10.1 10*3/uL (ref 4.0–10.5)
nRBC: 0 % (ref 0.0–0.2)

## 2019-10-16 MED ORDER — MORPHINE SULFATE ER 30 MG PO TBCR
30.0000 mg | EXTENDED_RELEASE_TABLET | Freq: Two times a day (BID) | ORAL | Status: DC
  Administered 2019-10-16: 30 mg via ORAL
  Filled 2019-10-16: qty 1

## 2019-10-16 MED ORDER — PROCHLORPERAZINE MALEATE 10 MG PO TABS: 10.0000 mg | ORAL_TABLET | Freq: Four times a day (QID) | ORAL | Status: DC | PRN

## 2019-10-16 MED ORDER — MORPHINE SULFATE ER 30 MG PO TBCR: 30.0000 mg | EXTENDED_RELEASE_TABLET | Freq: Two times a day (BID) | ORAL | Status: DC

## 2019-10-16 MED ORDER — MAGNESIUM OXIDE 400 (241.3 MG) MG PO TABS
200.0000 mg | ORAL_TABLET | Freq: Two times a day (BID) | ORAL | Status: DC
  Administered 2019-10-16 – 2019-10-19 (×7): 200 mg via ORAL
  Filled 2019-10-16 (×7): qty 1

## 2019-10-16 MED ORDER — DOCUSATE SODIUM 100 MG PO CAPS
100.0000 mg | ORAL_CAPSULE | Freq: Two times a day (BID) | ORAL | Status: DC
  Administered 2019-10-16 – 2019-10-19 (×6): 100 mg via ORAL
  Filled 2019-10-16 (×6): qty 1

## 2019-10-16 MED ORDER — ALUM & MAG HYDROXIDE-SIMETH 200-200-20 MG/5ML PO SUSP: 60.0000 mL | ORAL | Status: DC | PRN

## 2019-10-16 MED ORDER — HYDROCORTISONE (PERIANAL) 2.5 % EX CREA: 1.0000 "application " | TOPICAL_CREAM | Freq: Two times a day (BID) | CUTANEOUS | Status: DC | PRN

## 2019-10-16 MED ORDER — ZOLPIDEM TARTRATE 5 MG PO TABS
5.0000 mg | ORAL_TABLET | Freq: Every evening | ORAL | Status: DC | PRN
  Administered 2019-10-16: 5 mg via ORAL
  Administered 2019-10-17: 2.5 mg via ORAL
  Filled 2019-10-16 (×2): qty 1

## 2019-10-16 MED ORDER — ACETAMINOPHEN 325 MG PO TABS: 650.0000 mg | ORAL_TABLET | ORAL | Status: DC | PRN

## 2019-10-16 MED ORDER — SODIUM CHLORIDE 0.9 % IV SOLN
2.0000 g | Freq: Three times a day (TID) | INTRAVENOUS | Status: DC
  Administered 2019-10-16 – 2019-10-18 (×6): 2 g via INTRAVENOUS
  Filled 2019-10-16 (×9): qty 2

## 2019-10-16 MED ORDER — PIPERACILLIN-TAZOBACTAM 3.375 G IVPB
3.3750 g | Freq: Three times a day (TID) | INTRAVENOUS | Status: DC
  Administered 2019-10-16: 15:00:00 3.375 g via INTRAVENOUS
  Filled 2019-10-16 (×2): qty 50

## 2019-10-16 MED ORDER — POTASSIUM CHLORIDE CRYS ER 20 MEQ PO TBCR
20.0000 meq | EXTENDED_RELEASE_TABLET | Freq: Every day | ORAL | Status: DC
  Administered 2019-10-16 – 2019-10-19 (×4): 20 meq via ORAL
  Filled 2019-10-16 (×4): qty 1

## 2019-10-16 MED ORDER — MORPHINE SULFATE ER 30 MG PO TBCR
30.0000 mg | EXTENDED_RELEASE_TABLET | Freq: Two times a day (BID) | ORAL | Status: DC
  Administered 2019-10-16 – 2019-10-19 (×7): 30 mg via ORAL
  Filled 2019-10-16 (×7): qty 1

## 2019-10-16 MED ORDER — RIVAROXABAN 10 MG PO TABS
10.0000 mg | ORAL_TABLET | Freq: Every day | ORAL | Status: DC
  Administered 2019-10-16 – 2019-10-18 (×3): 10 mg via ORAL
  Filled 2019-10-16 (×4): qty 1

## 2019-10-16 MED ORDER — GUAIFENESIN-DM 100-10 MG/5ML PO SYRP: 10.0000 mL | ORAL_SOLUTION | ORAL | Status: DC | PRN

## 2019-10-16 MED ORDER — SENNOSIDES-DOCUSATE SODIUM 8.6-50 MG PO TABS: 1.0000 | ORAL_TABLET | Freq: Every evening | ORAL | Status: DC | PRN

## 2019-10-16 MED ORDER — ADULT MULTIVITAMIN W/MINERALS CH
1.0000 | ORAL_TABLET | Freq: Every day | ORAL | Status: DC
  Administered 2019-10-16 – 2019-10-19 (×4): 1 via ORAL
  Filled 2019-10-16 (×4): qty 1

## 2019-10-16 MED ORDER — OXYCODONE HCL 5 MG PO TABS
5.0000 mg | ORAL_TABLET | ORAL | Status: DC | PRN
  Administered 2019-10-16 – 2019-10-20 (×8): 10 mg via ORAL
  Filled 2019-10-16 (×8): qty 2

## 2019-10-16 MED ORDER — PROCHLORPERAZINE EDISYLATE 10 MG/2ML IJ SOLN: 10.0000 mg | Freq: Four times a day (QID) | INTRAMUSCULAR | Status: DC | PRN

## 2019-10-16 NOTE — Patient Instructions (Signed)

## 2019-10-16 NOTE — Progress Notes (Signed)
Pharmacy Antibiotic Note  Billy Mendoza is a 69 y.o. male admitted on 10/16/2019 with bacteremia.  Pharmacy has been consulted for cefepime dosing.  Pt has PMH significant for rectal cancer which has metastasized to the liver. Pt had biliary stent placed on 10/09/19, has been off of chemo for 1 month due to LFT elevation. Blood culture from 1/9 growing Klebsiella pneumonia, ID following and starting cefepime.  Today, 10/16/19  WBC 10.1  SCr 0.64, CrCl > 60 mL/min  Afebrile  Plan:  Cefepime 2 g IV q8h  Follow renal function and culture data for sensitivities     Temp (24hrs), Avg:99 F (37.2 C), Min:98.7 F (37.1 C), Max:99.3 F (37.4 C)  Recent Labs  Lab 10/13/19 1258 10/14/19 1846 10/16/19 1250  WBC 16.1* 14.9* 10.1  CREATININE 0.87 0.90 0.64  LATICACIDVEN  --  1.7  --     Estimated Creatinine Clearance: 99.7 mL/min (by C-G formula based on SCr of 0.64 mg/dL).    No Known Allergies  Antimicrobials this admission: cefepime 1/11 >>   Dose adjustments this admission:  Microbiology results: 1/9 BCx: Klebsiella pneumonia, S pending   Lenis Noon, PharmD 10/16/2019 3:20 PM

## 2019-10-16 NOTE — Progress Notes (Signed)
Report called to [facility] RN on [place], patient being admitted for bacteremia, Alert and oriented x 3, ambulatory, VS stable, afebrile, port has been accessed and labs have been drawn.  Patient can go to 1616 after checking in with admitting.

## 2019-10-16 NOTE — Telephone Encounter (Signed)
Per 1/8 los f/u open

## 2019-10-16 NOTE — H&P (Signed)
Billy Mendoza is an 69 y.o. male.   Chief Complaint:  Rectal cancer metastasized to liver [facility])  Sepsis  HPI:  Billy Mendoza is a 69 year old male with a history of a metastatic rectal cancer with liver metastasis who is managed by Elson Clan. He was most recently seen in the emergency room at [facility] on 10/14/2019 after having had a fever on Saturday morning of 100.6.  He was given Tylenol at home with improvement of his fever to 99.  His fever increased to 102 on Saturday afternoon at which time he was taken to the emergency room.  A urine returned with no evidence of a UTI.  Blood cultures x2 were collected.  He was given a dose of Zosyn while in the ER.  His blood cultures returned showing gram-negative rods which were identified as Klebsiella pneumonia.  A COVID-19 screening test was completed which returned negative at that time.  The patient was contacted and  brought into the cancer center today for admission.  He is currently afebrile.  He was last seen by Dr. Burr Medico on 10/13/2019.  He presented that day for treatment and in follow-up of a recent stent placement.  He was less jaundiced.  Since then his jaundice.  He continues to have dehydration despite drinking more water.  His right upper quadrant pain had resolved after having a stent placed.  He reported ongoing fatigue and anorexia.  He was having more difficulty performing activities of daily living and walking upstairs due to his fatigue.  At that time he reported having an elevated temperature and drenching night sweats.  He denied chills or cough.  He presents today with continued anorexia, fatigue, decreased endurance, and weight loss.  He denies constipation, diarrhea, nausea, or vomiting.  He does report that his urine is dark in color.  He has had no claylike stools.    Billy Mendoza complete oncologichistory is as follows:  SUMMARY OF ONCOLOGIC HISTORY:     Oncology History Overview Note   Cancer Staging Rectal cancer  metastasized to liver [facility]) Staging form: Colon and Rectum, AJCC 8th Edition - Clinical stage from 07/28/2018: Stage IVB (cTX, cNX, pM1b) - Signed by Truitt Merle, MD on 08/18/2018     Rectal cancer metastasized to liver ([place])   07/28/2018 Initial Biopsy    Biopsy 07/28/18 Final Microscopic Diagnosis A.Colon, Ascending, Polyp:  -Fragments of tubular adenoma -No high-grade dysplasia of malignancy.  B. Rectum, Mass, Polyp:  -Invasive adenocarcinoma, moderately - poorly differentiatred.     07/28/2018 Cancer Staging    Staging form: Colon and Rectum, AJCC 8th Edition - Clinical stage from 07/28/2018: Stage IVB (cTX, cNX, pM1b) - Signed by Truitt Merle, MD on 08/18/2018    08/04/2018 Initial Diagnosis    Rectal cancer metastasized to liver ([place])    08/16/2018 Pathology Results    Biospy  Diagnosis 08/16/18 Liver, needle/core biopsy - ADENOCARCINOMA. Microscopic Comment By morphology, the findings are compatible with the provided clinical history of colorectal adenocarcinoma. Results reported to [name] on 08/18/2018. Intradepartmental consultation was obtained ([name]).     08/16/2018 Miscellaneous    Foundation 1 genomic testing: MS-stable Tumor mutation burden  3/Mb K-ras/NRAS wild-type BRAF V600 E KDMSA R266Q PTEN T372f* SMAD4 loss TP53 loss      08/18/2018 Procedure    Colonoscopy by Dr. HBenson Norwayshowed malignant appearing partially obstructing tumor in the rectum, 8 cm above anal verge.    08/18/2018 - 09/07/2018 Chemotherapy    CAPOX q3weeks  with Xeloda '2000mg'$  BID 2 weeks on, 1 weeks off starting 08/18/18.  Swithced treatment due to BRAF mutation.     09/07/2018 - 12/24/2018 Chemotherapy    FOLFOXIRI and avastin every 2 weeks starting 09/07/18. Oxaliplatin held starting with cycle 8 and continue with FOLFIRI and Avastin. D/c due to disease progression.     11/04/2018 Imaging    CT CAP 11/04/18 IMPRESSION: 1. Peripheral filling defect in the  right main pulmonary artery extending primarily into the right lower lobe pulmonary artery. While the peripheral location indicates some degree of chronicity of this pulmonary embolus, this is a new finding compared to 07/28/2018. The right ventricular to left ventricular ratio is 0.7, which does not specifically indicate right heart strain. 2. The malignancy appears considerably improved, with reduced size pulmonary nodules; reduced size and increased internal necrosis of the hepatic metastatic lesions; marked improvement in the thoracic adenopathy; reduced conspicuity of the rectal tumor; and significant reduction in perirectal tumor burden/adenopathy. 3. Other imaging findings of potential clinical significance: Aortic Atherosclerosis (ICD10-I70.0). Coronary atherosclerosis. Bilateral renal cysts. Small umbilical hernia contains adipose tissue.  Critical Value/emergent results were called by telephone at the time of interpretation on 11/04/2018 at 3:05 pm to [name] , who verbally acknowledged these results.    01/18/2019 Imaging    CT AP W contrast  IMPRESSION: 1. Colonic obstruction resulting from rectal carcinoma. 2. Hepatic metastatic disease which is decreased in size compared with the prior examination of 11/04/2018. 3. Multiple right middle lobe and right lower lobe pulmonary nodules most consistent with metastatic disease.    02/01/2019 - 06/01/2019 Chemotherapy    second lineBRAFTOVI(encorafenib)'300mg'$  dailyand MEKTOVI(binimetinib), '45mg'$  q12hand Vectibix q2weeks starting 02/01/19.  -He has held [facility] 02/19/19-04/06/19 due to RT  -Stopped on 06/01/19 due to disease progression     02/13/2019 Imaging    CT Chest  IMPRESSION: Small bilateral pulmonary metastases, mildly progressive from January 2020.  Multifocal hepatic metastases, incompletely visualized, favored to be progressive from April 2020.  Aortic Atherosclerosis (ICD10-I70.0).     02/22/2019 -  03/21/2019 Radiation Therapy    -Palliative local radiation  -Holding mektovi (binimetinib) during RT -Due to significant rectal pain, diarrhea and stool incontinence, he stopped RT after 03/13/19. He was only able to finish 1/2 remianing treatments on 03/21/19.     05/31/2019 Imaging    CT CAP W contrast 05/31/19  IMPRESSION: 1. Pulmonary emboli with a saddle embolus. Critical Value/emergent results were called by telephone at the time of interpretation on 05/31/2019 at 12:03 pm to Cira Rue, NP , who verbally acknowledged these results. 2. Slight increase in perirectal soft tissue nodularity with overall worsening in pulmonary and hepatic metastatic disease. Omental nodularity appears slightly improved. 3. New mediastinal and periportal adenopathy. Aortocaval lymph node is not enlarged by CT size criteria but is new and therefore considered suspicious for metastatic involvement. 4. Aortic atherosclerosis (ICD10-170.0). Coronary artery calcification.    06/08/2019 - 09/04/2019 Chemotherapy    FOLFOXIRI q2weeks starting 06/08/19. Due to fatigue, diarrhea, nausea and low blood counts he was switched to FOLFIRI starting 06/22/19. Postponed the addition of Avastin until C4 on 08/03/19 given recent PE. Stopped 09/04/19 due to disease progression in liver.     08/30/2019 Imaging    CT CAP W Contrast 08/30/19  IMPRESSION: 1. Redemonstrated circumferential mass of the distal rectum (series 2, image 126). There has been interval removal of a previously seen rectal stent. 2. There are however newly enlarged right perirectal lymph nodes, measuring up to  6 mm (series 2, image 121). 3. Multiple bilateral pulmonary nodules are stable or slightly increased in size, the largest index nodule in the right lower lobe measuring 1.2 x 1.2 cm (series 6, image 106), a nodule of the right middle lobe measuring 5 mm, previously 3 mm or smaller (series 6, image 89). 4. Interval enlargement of multiple,  somewhat ill-defined low-attenuation liver lesions, an index lesion in the right lobe of the liver measuring 3.8 x 2.8 cm, previously 1.9 x 1.7 cm (series 2, image 47). 5. Interval enlargement of an aortocaval lymph node, measuring 1.3 x 1.1 cm, previously 1.1 x 0.8 cm (series 2, image 76). Interval enlargement in ill-defined portacaval lymph nodes, the largest measuring 2.4 x 1.3 cm, previously 1.5 x 0.9 cm (series 2, image 64). 6. Above constellation of findings is consistent with worsened metastatic disease. 7. Other chronic and incidental findings as detailed above. Aortic Atherosclerosis (ICD10-I70.0).    09/22/2019 -  Chemotherapy    Regorafinib (Stivarga)'80mg'$  daily 3 weeks on/1 week offstarting 09/22/19.           -Restarted at '40mg'$  every other day on 10/14/19.     10/13/2019 -  Chemotherapy    IVKeytrudaq3weeksstarting 10/13/19       CURRENT THERAPY:  -Regorafinib (Stivarga)'80mg'$  daily 3 weeks on/1 week offstarting 09/22/19. Restarted at '40mg'$  every other day on 10/14/19.  Pecolia Ades 10/13/19     Past Medical History:  Diagnosis Date  . colorectal ca dx'd 07/2018  . Liver metastases ([place]) dx'd 07/2018  . Lung metastases ([place]) dx'd 07/2018    Past Surgical History:  Procedure Laterality Date  . BILIARY STENT PLACEMENT  10/05/2019   Procedure: BILIARY STENT PLACEMENT;  Surgeon: Carol Ada, MD;  Location: [facility] ENDOSCOPY;  Service: Endoscopy;;  . COLONIC STENT PLACEMENT N/A 01/19/2019   Procedure: COLONIC STENT PLACEMENT;  Surgeon: Carol Ada, MD;  Location: [facility] ENDOSCOPY;  Service: Endoscopy;  Laterality: N/A;  . ERCP N/A 10/05/2019   Procedure: ENDOSCOPIC RETROGRADE CHOLANGIOPANCREATOGRAPHY (ERCP);  Surgeon: Carol Ada, MD;  Location: [place];  Service: Endoscopy;  Laterality: N/A;  . ESOPHAGOGASTRODUODENOSCOPY (EGD) WITH PROPOFOL N/A 10/05/2019   Procedure: ESOPHAGOGASTRODUODENOSCOPY (EGD) WITH PROPOFOL;  Surgeon: Carol Ada,  MD;  Location: [place];  Service: Endoscopy;  Laterality: N/A;  . EUS  10/05/2019   Procedure: UPPER ENDOSCOPIC ULTRASOUND (EUS) LINEAR;  Surgeon: Carol Ada, MD;  Location: [facility] ENDOSCOPY;  Service: Endoscopy;;  . FLEXIBLE SIGMOIDOSCOPY N/A 01/19/2019   Procedure: Beryle Quant;  Surgeon: Carol Ada, MD;  Location: [facility] ENDOSCOPY;  Service: Endoscopy;  Laterality: N/A;  . IR IMAGING GUIDED PORT INSERTION  08/16/2018  . IR US GUIDE BX ASP/DRAIN  08/16/2018  . REMOVAL OF STONES  10/05/2019   Procedure: REMOVAL OF STONES;  Surgeon: Carol Ada, MD;  Location: [facility] ENDOSCOPY;  Service: Endoscopy;;  . Joan Mayans  10/05/2019   Procedure: Joan Mayans;  Surgeon: Carol Ada, MD;  Location: [facility] ENDOSCOPY;  Service: Endoscopy;;    Family History  Problem Relation Age of Onset  . Cancer Mother 8       breast cancer  . Atrial fibrillation Father   . Heart failure Father   . Cancer Maternal Grandmother        lung cancer    Social History:  reports that he has quit smoking. His smoking use included cigarettes and cigars. He quit after 10.00 years of use. He has never used smokeless tobacco. He reports current alcohol use of about 1.0 - 2.0 standard drinks of alcohol per  week. He reports that he does not use drugs.  Allergies: No Known Allergies  (Not in a hospital admission)   Results for orders placed or performed during the hospital encounter of 10/14/19 (from the past 48 hour(s))  Lactic acid, plasma     Status: None   Collection Time: 10/14/19  6:46 PM  Result Value Ref Range   Lactic Acid, Venous 1.7 0.5 - 1.9 mmol/L    Comment: Performed at [facility], [place] [address] [place], [place] [address]  Comprehensive metabolic panel     Status: Abnormal   Collection Time: 10/14/19  6:46 PM  Result Value Ref Range   Sodium 133 (L) 135 - 145 mmol/L   Potassium 3.6 3.5 - 5.1 mmol/L   Chloride 99 98 - 111 mmol/L   CO2 21 (L) 22 - 32 mmol/L    Glucose, Bld 104 (H) 70 - 99 mg/dL   BUN 17 8 - 23 mg/dL   Creatinine, Ser 0.90 0.61 - 1.24 mg/dL   Calcium 8.2 (L) 8.9 - 10.3 mg/dL   Total Protein 6.4 (L) 6.5 - 8.1 g/dL   Albumin 2.2 (L) 3.5 - 5.0 g/dL   AST 264 (H) 15 - 41 U/L   ALT 322 (H) 0 - 44 U/L   Alkaline Phosphatase 1,932 (H) 38 - 126 U/L    Comment: RESULTS CONFIRMED BY MANUAL DILUTION   Total Bilirubin 3.1 (H) 0.3 - 1.2 mg/dL   GFR calc non Af Amer >60 >60 mL/min   GFR calc Af Amer >60 >60 mL/min   Anion gap 13 5 - 15    Comment: Performed at [facility], [place] [address], [place], [place] [address]  CBC WITH DIFFERENTIAL     Status: Abnormal   Collection Time: 10/14/19  6:46 PM  Result Value Ref Range   WBC 14.9 (H) 4.0 - 10.5 K/uL   RBC 3.03 (L) 4.22 - 5.81 MIL/uL   Hemoglobin 9.0 (L) 13.0 - 17.0 g/dL   HCT 29.6 (L) 39.0 - 52.0 %   MCV 97.7 80.0 - 100.0 fL   MCH 29.7 26.0 - 34.0 pg   MCHC 30.4 30.0 - 36.0 g/dL   RDW 17.8 (H) 11.5 - 15.5 %   Platelets 153 150 - 400 K/uL   nRBC 0.0 0.0 - 0.2 %   Neutrophils Relative % 95 %   Neutro Abs 14.2 (H) 1.7 - 7.7 K/uL   Lymphocytes Relative 1 %   Lymphs Abs 0.2 (L) 0.7 - 4.0 K/uL   Monocytes Relative 3 %   Monocytes Absolute 0.4 0.1 - 1.0 K/uL   Eosinophils Relative 0 %   Eosinophils Absolute 0.0 0.0 - 0.5 K/uL   Basophils Relative 0 %   Basophils Absolute 0.0 0.0 - 0.1 K/uL   Immature Granulocytes 1 %   Abs Immature Granulocytes 0.12 (H) 0.00 - 0.07 K/uL    Comment: Performed at [facility], [place] [address], [place], [place] [address]  APTT     Status: Abnormal   Collection Time: 10/14/19  6:46 PM  Result Value Ref Range   aPTT 47 (H) 24 - 36 seconds    Comment:        IF BASELINE aPTT IS ELEVATED, SUGGEST PATIENT RISK ASSESSMENT BE USED TO DETERMINE APPROPRIATE ANTICOAGULANT THERAPY. Performed at [facility], [place] [address] [place], [place] [address]   Protime-INR     Status: Abnormal   Collection  Time: 10/14/19  6:46  PM  Result Value Ref Range   Prothrombin Time 24.7 (H) 11.4 - 15.2 seconds   INR 2.2 (H) 0.8 - 1.2    Comment: (NOTE) INR goal varies based on device and disease states. Performed at [facility], [place] [address], [place], [place] [address]   Blood Culture (routine x 2)     Status: Abnormal (Preliminary result)   Collection Time: 10/14/19  6:46 PM   Specimen: BLOOD  Result Value Ref Range   Specimen Description      BLOOD PORTA CATH Performed at [place] [address], [place], [place] [address]    Special Requests      BOTTLES DRAWN AEROBIC AND ANAEROBIC Blood Culture adequate volume Performed at [place] [address] [place], [place] [address]    Culture  Setup Time      GRAM NEGATIVE RODS IN BOTH AEROBIC AND ANAEROBIC BOTTLES CRITICAL RESULT CALLED TO, READ BACK BY AND VERIFIED WITH: C. REED, RN (MCHP) AT 8416 ON 10/15/19 BY C. JESSUP, MT.    Culture (A)     KLEBSIELLA PNEUMONIAE CULTURE REINCUBATED FOR BETTER GROWTH Performed at [facility], [place] [address] [place], [place] [address]    Report Status PENDING   Blood Culture ID Panel (Reflexed)     Status: Abnormal   Collection Time: 10/14/19  6:46 PM  Result Value Ref Range   Enterococcus species NOT DETECTED NOT DETECTED   Listeria monocytogenes NOT DETECTED NOT DETECTED   Staphylococcus species NOT DETECTED NOT DETECTED   Staphylococcus aureus (BCID) NOT DETECTED NOT DETECTED   Streptococcus species NOT DETECTED NOT DETECTED   Streptococcus agalactiae NOT DETECTED NOT DETECTED   Streptococcus pneumoniae NOT DETECTED NOT DETECTED   Streptococcus pyogenes NOT DETECTED NOT DETECTED   Acinetobacter baumannii NOT DETECTED NOT DETECTED   Enterobacteriaceae species DETECTED (A) NOT DETECTED    Comment: Enterobacteriaceae represent a large family of gram-negative bacteria, not a single organism. CRITICAL RESULT CALLED TO, READ  BACK BY AND VERIFIED WITH: C. REED, RN (MCHP) AT 1601 ON 10/15/19 BY C. JESSUP, MT.    Enterobacter cloacae complex NOT DETECTED NOT DETECTED   Escherichia coli NOT DETECTED NOT DETECTED   Klebsiella oxytoca NOT DETECTED NOT DETECTED   Klebsiella pneumoniae DETECTED (A) NOT DETECTED    Comment: CRITICAL RESULT CALLED TO, READ BACK BY AND VERIFIED WITH: C. REED, RN (MCHP) AT 0932 ON 10/15/19 BY C. JESSUP, MT.    Proteus species NOT DETECTED NOT DETECTED   Serratia marcescens NOT DETECTED NOT DETECTED   Carbapenem resistance NOT DETECTED NOT DETECTED   Haemophilus influenzae NOT DETECTED NOT DETECTED   Neisseria meningitidis NOT DETECTED NOT DETECTED   Pseudomonas aeruginosa NOT DETECTED NOT DETECTED   Candida albicans NOT DETECTED NOT DETECTED   Candida glabrata NOT DETECTED NOT DETECTED   Candida krusei NOT DETECTED NOT DETECTED   Candida parapsilosis NOT DETECTED NOT DETECTED   Candida tropicalis NOT DETECTED NOT DETECTED    Comment: Performed at [facility], [place] [address] [place], [place] [address]  Respiratory Panel by RT PCR (Flu A&B, Covid) - Nasopharyngeal Swab     Status: None   Collection Time: 10/14/19  6:48 PM   Specimen: Nasopharyngeal Swab  Result Value Ref Range   SARS Coronavirus 2 by RT PCR NEGATIVE NEGATIVE    Comment: (NOTE) SARS-CoV-2 target nucleic acids are NOT DETECTED. The SARS-CoV-2 RNA is generally detectable in upper respiratoy specimens during the  acute phase of infection. The lowest concentration of SARS-CoV-2 viral copies this assay can detect is 131 copies/mL. A negative result does not preclude SARS-Cov-2 infection and should not be used as the sole basis for treatment or other patient management decisions. A negative result may occur with  improper specimen collection/handling, submission of specimen other than nasopharyngeal swab, presence of viral mutation(s) within the areas targeted by this assay, and inadequate number of viral  copies (<131 copies/mL). A negative result must be combined with clinical observations, patient history, and epidemiological information. The expected result is Negative. Fact Sheet for Patients:  PinkCheek.be Fact Sheet for Healthcare Providers:  GravelBags.it This test is not yet ap proved or cleared by the [place] FDA and  has been authorized for detection and/or diagnosis of SARS-CoV-2 by FDA under an Emergency Use Authorization (EUA). This EUA will remain  in effect (meaning this test can be used) for the duration of the COVID-19 declaration under Section 564(b)(1) of the Act, 21 U.S.C. section 360bbb-3(b)(1), unless the authorization is terminated or revoked sooner.    Influenza A by PCR NEGATIVE NEGATIVE   Influenza B by PCR NEGATIVE NEGATIVE    Comment: (NOTE) The Xpert Xpress SARS-CoV-2/FLU/RSV assay is intended as an aid in  the diagnosis of influenza from Nasopharyngeal swab specimens and  should not be used as a sole basis for treatment. Nasal washings and  aspirates are unacceptable for Xpert Xpress SARS-CoV-2/FLU/RSV  testing. Fact Sheet for Patients: PinkCheek.be Fact Sheet for Healthcare Providers: GravelBags.it This test is not yet approved or cleared by the [place] FDA and  has been authorized for detection and/or diagnosis of SARS-CoV-2 by  FDA under an Emergency Use Authorization (EUA). This EUA will remain  in effect (meaning this test can be used) for the duration of the  Covid-19 declaration under Section 564(b)(1) of the Act, 21  U.S.C. section 360bbb-3(b)(1), unless the authorization is  terminated or revoked. Performed at [facility], [place] [address] [place], [place] [address]   Blood Culture (routine x 2)     Status: None (Preliminary result)   Collection Time: 10/14/19  6:51 PM   Specimen: BLOOD LEFT HAND   Result Value Ref Range   Specimen Description      BLOOD LEFT HAND Performed at [place] [address], [place], [place] [address]    Special Requests      BOTTLES DRAWN AEROBIC AND ANAEROBIC Blood Culture adequate volume Performed at [place] [address], [place], [place] [address]    Culture  Setup Time      GRAM NEGATIVE RODS IN BOTH AEROBIC AND ANAEROBIC BOTTLES CRITICAL RESULT CALLED TO, READ BACK BY AND VERIFIED WITH: C. REED, RN (MCHP) AT 1607 ON 10/15/19 BY C. JESSUP, MT.    Culture      GRAM NEGATIVE RODS IDENTIFICATION TO FOLLOW Performed at [facility], [place] [address] [place], [place] [address]    Report Status PENDING   Urinalysis, Routine w reflex microscopic     Status: Abnormal   Collection Time: 10/15/19  1:06 AM  Result Value Ref Range   Color, Urine AMBER (A) YELLOW    Comment: BIOCHEMICALS MAY BE AFFECTED BY COLOR   APPearance CLEAR CLEAR   Specific Gravity, Urine >1.046 (H) 1.005 - 1.030   pH 6.0 5.0 - 8.0   Glucose, UA NEGATIVE NEGATIVE mg/dL   Hgb urine dipstick NEGATIVE NEGATIVE   Bilirubin Urine NEGATIVE NEGATIVE  Ketones, ur NEGATIVE NEGATIVE mg/dL   Protein, ur NEGATIVE NEGATIVE mg/dL   Nitrite NEGATIVE NEGATIVE   Leukocytes,Ua NEGATIVE NEGATIVE    Comment: Performed at [place] [address], [place], [place] [address]  Urine culture     Status: None   Collection Time: 10/15/19  1:06 AM   Specimen: In/Out Cath Urine  Result Value Ref Range   Specimen Description      IN/OUT CATH URINE Performed at [facility], [place] [address], [place], [place] [address]    Special Requests      NONE Performed at [facility], [place] [address] [place], [place] [address]    Culture      NO GROWTH Performed at [facility], [place] [address] [place], [place] [address]    Report Status 10/16/2019 FINAL    CT Abdomen Pelvis W  Contrast  Result Date: 10/14/2019 CLINICAL DATA:  69 year old male with metastatic rectal cancer presents with fever and fatigue. Concern for intra-abdominal infection. EXAM: CT ABDOMEN AND PELVIS WITH CONTRAST TECHNIQUE: Multidetector CT imaging of the abdomen and pelvis was performed using the standard protocol following bolus administration of intravenous contrast. CONTRAST:  122m OMNIPAQUE IOHEXOL 300 MG/ML  SOLN COMPARISON:  CT abdomen pelvis dated 08/30/2019. FINDINGS: Lower chest: Multiple pulmonary lesions noted primarily involving the visualized right lung and measuring up to 19 mm in the right lower lobe consistent with metastatic disease. These have increased in size since the prior CT. There is coronary vascular calcification. No intra-abdominal free air or free fluid. Hepatobiliary: Multiple hypodense lesions within the liver measuring up to 6.8 x 4.7 cm (previously 3.8 x 2.7 cm) in the dome of the liver most consistent with metastatic disease. Significant interval increase in the size of the liver metastasis since the prior CT. There is mild periportal edema versus dilatation of the intrahepatic biliary tree. High attenuating content gallbladder may represent sludge or small stones versus vicarious excretion of contrast. A biliary stent is in place. Pancreas: There is a 2 x 3 cm low attenuating lesion in the uncinate process of the pancreas which has increased in size since the prior CT. This may represent metastatic disease within the pancreas or within a peripancreatic lymph node. A primary pancreatic malignancy is not entirely excluded. No dilatation of the main pancreatic duct. No active inflammatory changes. Spleen: Normal in size without focal abnormality. Adrenals/Urinary Tract: The adrenal glands are unremarkable. There is no hydronephrosis on either side. There is symmetric enhancement and excretion of contrast by both kidneys. Multiple bilateral renal cysts measure up to 3 cm in the  interpolar aspect of the right kidney. The visualized ureters and urinary bladder appear unremarkable. Stomach/Bowel: There is circumferential thickening of the wall of the rectum measuring approximately 15 mm in thickness. There is haziness and stranding of the perirectal fat. Several small rounded nodular densities adjacent to the rectum measure up to 9 mm (series 2, image 91) and may represent mildly rounded lymph nodes. These findings are similar to prior CT. There is overall mild-to-moderate segmental narrowing and stricture of the rectal vault. There is large amount of stool in the distal colon proximal to this narrowed segment. No evidence of small-bowel obstruction. The appendix is normal. Vascular/Lymphatic: Mild aortoiliac atherosclerotic disease. No portal venous gas. Top-normal periportal lymph nodes measure up to 11 mm in short axis. Aortocaval hypodense lymph node measures 2.4 x 2.3 cm (coronal series 4, image 65) and increased since the prior  CT (previously measuring 1.3 x 1.4 cm). Reproductive: The prostate and seminal vesicles are grossly unremarkable. Other: Small fat containing umbilical hernia. Musculoskeletal: No acute or significant osseous findings. IMPRESSION: 1. Interval progression of metastatic disease in the chest, abdomen and pelvis. 2. Circumferential thickening of the wall of the rectum with mild-to-moderate segmental narrowing and stricture of the rectal vault similar to prior CT. There is large amount of stool within the distal colon proximal to this narrowed segment. No evidence of small-bowel obstruction. Normal appendix. 3. Interval increase in the size of the low attenuating lesion in the uncinate process of the pancreas concerning for metastatic disease. A primary pancreatic malignancy is not entirely excluded. 4. Aortic Atherosclerosis (ICD10-I70.0). Electronically Signed   By: Anner Crete M.D.   On: 10/14/2019 22:27   DG Chest Port 1 View  Result Date:  10/14/2019 CLINICAL DATA:  69 year old male with fatigue and fever. EXAM: PORTABLE CHEST 1 VIEW COMPARISON:  CT of the chest abdomen pelvis dated 08/30/2019. FINDINGS: Right pectoral Port-A-Cath with tip at the cavoatrial junction. There is mild eventration of the right hemidiaphragm. No focal consolidation, pleural effusion, or pneumothorax. Top-normal cardiac silhouette. No acute osseous pathology. IMPRESSION: No active cardiopulmonary disease. Electronically Signed   By: Anner Crete M.D.   On: 10/14/2019 19:23    Review of Systems  Constitutional: Positive for activity change, appetite change, diaphoresis, fever and unexpected weight change. Negative for chills.  HENT: Negative for trouble swallowing and voice change.   Respiratory: Negative for cough, chest tightness, shortness of breath and wheezing.   Cardiovascular: Negative for chest pain, palpitations and leg swelling.  Gastrointestinal: Negative for abdominal distention, abdominal pain, constipation, diarrhea, nausea and vomiting.  Genitourinary: Negative for difficulty urinating, dysuria, flank pain and frequency.       Dark urine  Musculoskeletal: Negative for back pain and myalgias.  Neurological: Positive for weakness. Negative for dizziness, light-headedness and headaches.    Blood pressure 113/79, pulse 85, temperature 98.7 F (37.1 C), temperature source Temporal, resp. rate 18, height 6' (1.829 m), weight 203 lb 6.4 oz (92.3 kg), SpO2 99 %. Physical Exam  Constitutional: No distress.  HENT:  Head: Normocephalic and atraumatic.  Mouth/Throat: Oropharynx is clear and moist. No oropharyngeal exudate.  Eyes: Right eye exhibits no discharge. Left eye exhibits no discharge. Scleral icterus is present.  Cardiovascular: Normal rate, regular rhythm and normal heart sounds. Exam reveals no gallop and no friction rub.  No murmur heard. Respiratory: Effort normal and breath sounds normal. No respiratory distress. He has no wheezes.  He has no rales.  GI: Soft. Bowel sounds are normal. He exhibits no distension and no mass. There is no abdominal tenderness. There is no rebound and no guarding.  Musculoskeletal:        General: No edema.  Neurological: He is alert. Coordination normal.  The patient is ambulating with the use of a wheelchair.  Skin: Skin is warm and dry. No rash noted. He is not diaphoretic. No erythema.  Psychiatric: He has a normal mood and affect. His behavior is normal. Judgment and thought content normal.     Assessment/Plan 1) Sepsis: Billy Mendoza had blood cultures were collected today.  He has been admitted and is being given IV Zosyn every 8 hours.  2) Rectal cancer metastasized to liver: Billy Mendoza continues to be managed by [name] and is currently treated with Regorafinib (Stivarga)'80mg'$  daily 3 weeks on/1 week offstarting 09/22/19. Restarted at '40mg'$  every other day on 10/14/19.  He also received [facility] 10/13/19.  3) Pulmonary emboli: The patient continues on Xarelto 10 mg once daily.  4) Goals of care: The patient is being treated with palliative chemotherapy.  He understands that the intent of therapy is not curative.  5) CODE STATUS:  DNR/DNI   Harle Stanford, PA-C 10/16/2019, 2:28 PM

## 2019-10-16 NOTE — Progress Notes (Signed)
Pt accessed for admission.  Two sets of blood cultures drawn (port and peripheral) before any IV abx started.

## 2019-10-16 NOTE — Telephone Encounter (Signed)
[id number] spoke with Shawn in Bed Placement for admission.  They will call back when bed available.

## 2019-10-16 NOTE — H&P (Signed)
Medical Oncology Admission History and Physical   Billy Mendoza is an 69 y.o. male.   Chief Complaint:  Positive blood culture   HPI:  Billy Mendoza is a 69 year old male with a history of a metastatic rectal cancer with liver metastasis who is managed by Elson Clan. He was most recently seen in the emergency room at [facility] on 10/14/2019 after having had a fever on Saturday morning of 100.6. He was given Tylenol at home with improvement of his fever to 99. His fever increased to 102 on Saturday afternoon at which time he was taken to the emergency room. A urine returned with no evidence of a UTI. Blood cultures x2 were collected. He was given a dose of Zosyn while in the ER. His blood cultures returned showing gram-negative rods which were identified as Klebsiella pneumonia. A COVID-19 screening test was completed which returned negative at that time. The patient was contacted and brought into the cancer center today for admission. He is currently afebrile. He was last seen by Dr. Burr Medico on 10/13/2019. He presented that day for treatment and in follow-up of a recent stent placement. He was less jaundiced. Since then his jaundice. He continues to have dehydration despite drinking more water. His right upper quadrant pain had resolved after having a stent placed. He reported ongoing fatigue and anorexia. He was having more difficulty performing activities of daily living and walking upstairs Mendoza to his fatigue. At that time he reported having an elevated temperature and drenching night sweats. He denied chills or cough. He presents today with continued anorexia, fatigue, decreased endurance, and weight loss. He denies constipation, diarrhea, nausea, or vomiting. He does report that his urine is dark in color. He has had no claylike stools.  Billy Mendoza complete oncologichistory is as follows:   SUMMARY OF ONCOLOGIC HISTORY:      Oncology History Overview Note   Cancer Staging  Rectal cancer  metastasized to liver [facility])  Staging form: Colon and Rectum, AJCC 8th Edition  - Clinical stage from 07/28/2018: Stage IVB (cTX, cNX, pM1b) - Signed by Truitt Merle, MD on 08/18/2018   Rectal cancer metastasized to liver ([place])   07/28/2018 Initial Biopsy    Biopsy 07/28/18  Final Microscopic Diagnosis  A.Colon, Ascending, Polyp:  -Fragments of tubular adenoma  -No high-grade dysplasia of malignancy.  B. Rectum, Mass, Polyp:  -Invasive adenocarcinoma, moderately - poorly differentiatred.    07/28/2018 Cancer Staging    Staging form: Colon and Rectum, AJCC 8th Edition  - Clinical stage from 07/28/2018: Stage IVB (cTX, cNX, pM1b) - Signed by Truitt Merle, MD on 08/18/2018    08/04/2018 Initial Diagnosis    Rectal cancer metastasized to liver ([place])    08/16/2018 Pathology Results    Biospy  Diagnosis 08/16/18  Liver, needle/core biopsy  - ADENOCARCINOMA.  Microscopic Comment  By morphology, the findings are compatible with the provided clinical history of colorectal adenocarcinoma.  Results reported to [name] on 08/18/2018.  Intradepartmental consultation was obtained ([name]).    08/16/2018 Miscellaneous    Foundation 1 genomic testing:  MS-stable  Tumor mutation burden 3/Mb  K-ras/NRAS wild-type  BRAF V600 E  KDMSA R266Q  PTEN T373f*  SMAD4 loss  TP53 loss    08/18/2018 Procedure    Colonoscopy by Dr. HBenson Norwayshowed malignant appearing partially obstructing tumor in the rectum, 8 cm above anal verge.    08/18/2018 - 09/07/2018 Chemotherapy    CAPOX q3weeks with Xeloda 20026mBID 2 weeks  on, 1 weeks off starting 08/18/18. Swithced treatment Mendoza to BRAF mutation.    09/07/2018 - 12/24/2018 Chemotherapy    FOLFOXIRI and avastin every 2 weeks starting 09/07/18. Oxaliplatin held starting with cycle 8 and continue with FOLFIRI and Avastin. D/c Mendoza to disease progression.    11/04/2018 Imaging    CT CAP 11/04/18  IMPRESSION:  1. Peripheral filling defect in the right main pulmonary  artery  extending primarily into the right lower lobe pulmonary artery.  While the peripheral location indicates some degree of chronicity of  this pulmonary embolus, this is a new finding compared to  07/28/2018. The right ventricular to left ventricular ratio is 0.7,  which does not specifically indicate right heart strain.  2. The malignancy appears considerably improved, with reduced size  pulmonary nodules; reduced size and increased internal necrosis of  the hepatic metastatic lesions; marked improvement in the thoracic  adenopathy; reduced conspicuity of the rectal tumor; and significant  reduction in perirectal tumor burden/adenopathy.  3. Other imaging findings of potential clinical significance: Aortic  Atherosclerosis (ICD10-I70.0). Coronary atherosclerosis. Bilateral  renal cysts. Small umbilical hernia contains adipose tissue.  Critical Value/emergent results were called by telephone at the time  of interpretation on 11/04/2018 at 3:05 pm to [name] , who  verbally acknowledged these results.    01/18/2019 Imaging    CT AP W contrast  IMPRESSION:  1. Colonic obstruction resulting from rectal carcinoma.  2. Hepatic metastatic disease which is decreased in size compared  with the prior examination of 11/04/2018.  3. Multiple right middle lobe and right lower lobe pulmonary nodules  most consistent with metastatic disease.    02/01/2019 - 06/01/2019 Chemotherapy    second line BRAFTOVI(encorafenib) 367m daily and MEKTOVI(binimetinib), 490mq12h and Vectibix q2weeks starting 02/01/19.  -[facility] Mendoza to RT  -Stopped on 06/01/19 Mendoza to disease progression    02/13/2019 Imaging    CT Chest  IMPRESSION:  Small bilateral pulmonary metastases, mildly progressive from  January 2020.  Multifocal hepatic metastases, incompletely visualized, favored to  be progressive from April 2020.  Aortic Atherosclerosis (ICD10-I70.0).    02/22/2019 - 03/21/2019  Radiation Therapy    -Palliative local radiation  -Holding mektovi (binimetinib) during RT  -Mendoza to significant rectal pain, diarrhea and stool incontinence, he stopped RT after 03/13/19. He was only able to finish 1/2 remianing treatments on 03/21/19.    05/31/2019 Imaging    CT CAP W contrast 05/31/19  IMPRESSION:  1. Pulmonary emboli with a saddle embolus. Critical Value/emergent  results were called by telephone at the time of interpretation on  05/31/2019 at 12:03 pm to [name] , who verbally  acknowledged these results.  2. Slight increase in perirectal soft tissue nodularity with overall  worsening in pulmonary and hepatic metastatic disease. Omental  nodularity appears slightly improved.  3. New mediastinal and periportal adenopathy. Aortocaval lymph node  is not enlarged by CT size criteria but is new and therefore  considered suspicious for metastatic involvement.  4. Aortic atherosclerosis (ICD10-170.0). Coronary artery  calcification.    06/08/2019 - 09/04/2019 Chemotherapy    FOLFOXIRI q2weeks starting 06/08/19. Mendoza to fatigue, diarrhea, nausea and low blood counts he was switched to FOLFIRI starting 06/22/19. Postponed the addition of Avastin until C4 on 08/03/19 given recent PE. Stopped 09/04/19 Mendoza to disease progression in liver.    08/30/2019 Imaging    CT CAP W Contrast 08/30/19  IMPRESSION:  1. Redemonstrated circumferential mass of the  distal rectum (series  2, image 126). There has been interval removal of a previously seen  rectal stent.  2. There are however newly enlarged right perirectal lymph nodes,  measuring up to 6 mm (series 2, image 121).  3. Multiple bilateral pulmonary nodules are stable or slightly  increased in size, the largest index nodule in the right lower lobe  measuring 1.2 x 1.2 cm (series 6, image 106), a nodule of the right  middle lobe measuring 5 mm, previously 3 mm or smaller (series 6,  image 89).  4. Interval enlargement of multiple,  somewhat ill-defined  low-attenuation liver lesions, an index lesion in the right lobe of  the liver measuring 3.8 x 2.8 cm, previously 1.9 x 1.7 cm (series 2,  image 47).  5. Interval enlargement of an aortocaval lymph node, measuring 1.3 x  1.1 cm, previously 1.1 x 0.8 cm (series 2, image 76). Interval  enlargement in ill-defined portacaval lymph nodes, the largest  measuring 2.4 x 1.3 cm, previously 1.5 x 0.9 cm (series 2, image  64).  6. Above constellation of findings is consistent with worsened  metastatic disease.  7. Other chronic and incidental findings as detailed above. Aortic  Atherosclerosis (ICD10-I70.0).    09/22/2019 -  Chemotherapy    Regorafinib (Stivarga) 48m daily 3 weeks on/1 week off starting 09/22/19.  -Restarted at 437mevery other day on 10/14/19.    10/13/2019 -  Chemotherapy    IV Keytruda q3weeks starting 10/13/19    CURRENT THERAPY:  -Regorafinib (Stivarga) 8033maily 3 weeks on/1 week off starting 09/22/19. Restarted at 65m85mery other day on 10/14/19.  -IV Keytruda q3weeks starting 10/13/19      Past Medical History:  Diagnosis Date  . colorectal ca dx'd 07/2018  . Liver metastases (HCC)[place]'d 07/2018  . Lung metastases (HCC)[place]'d 07/2018        Past Surgical History:  Procedure Laterality Date  . BILIARY STENT PLACEMENT  10/05/2019   Procedure: BILIARY STENT PLACEMENT; Surgeon: HungCarol Ada; [facility]; Service: Endoscopy;;  . COLONIC STENT PLACEMENT N/A 01/19/2019   Procedure: COLONIC STENT PLACEMENT; Surgeon: HungCarol Ada; Location: [facility] ENDOSCOPY; Service: Endoscopy; Laterality: N/A;  . ERCP N/A 10/05/2019   Procedure: ENDOSCOPIC RETROGRADE CHOLANGIOPANCREATOGRAPHY (ERCP); Surgeon: HungCarol Ada; [facility]oscopy; Laterality: N/A;  . ESOPHAGOGASTRODUODENOSCOPY (EGD) WITH PROPOFOL N/A 10/05/2019   Procedure: ESOPHAGOGASTRODUODENOSCOPY (EGD) WITH PROPOFOL; Surgeon: HungCarol Ada; [facility]; Laterality: N/A;  . EUS  10/05/2019   Procedure: UPPER ENDOSCOPIC ULTRASOUND (EUS) LINEAR; Surgeon: HungCarol Ada; [facility]; Service: [facility];;  . FLEXIBLE SIGMOIDOSCOPY N/A 01/19/2019   Procedure: FLEXBeryle Quantrgeon: HungCarol Ada; [facility] ENDOS[facility]; Service: Endoscopy; Laterality: N/A;  . IR IMAGING GUIDED PORT INSERTION  08/16/2018  . IR US GKoreaDE BX ASP/DRAIN  08/16/2018  . REMOVAL OF STONES  10/05/2019   Procedure: REMOVAL OF STONES; Surgeon: HungCarol Ada; [facility]; Service: [facility];;  . SPHIJoan Mayans/31/2020   Procedure: SPHIJoan Mayansrgeon: HungCarol Ada; [facility]; Service: [facility];;        Family History  Problem Relation Age of Onset  . Cancer Mother 65  42reast cancer  . Atrial fibrillation Father   . Heart failure Father   . Cancer Maternal Grandmother    lung cancer    Social History: reports that he has quit smoking. His smoking use included cigarettes and cigars. He quit after 10.00 years of use.  He has never used smokeless tobacco. He reports current alcohol use of about 1.0 - 2.0 standard drinks of alcohol per week. He reports that he does not use drugs.  Allergies: No Known Allergies  (Not in a hospital admission)   Review of Systems  Constitutional: Positive for activity change, appetite change, diaphoresis, fever and unexpected weight change. Negative for chills.  HENT: Negative for trouble swallowing and voice change.  Respiratory: Negative for cough, chest tightness, shortness of breath and wheezing.  Cardiovascular: Negative for chest pain, palpitations and leg swelling.  Gastrointestinal: Negative for abdominal distention, abdominal pain, constipation, diarrhea, nausea and vomiting.  Genitourinary: Negative for difficulty urinating, dysuria, flank pain and frequency.  Dark urine  Musculoskeletal: Negative for back pain and myalgias.  Neurological:  Positive for weakness. Negative for dizziness, light-headedness and headaches.   Physical exam  Blood pressure 113/79, pulse 85, temperature 98.7 F (37.1 C), temperature source Temporal, resp. rate 18, height 6' (1.829 m), weight 203 lb 6.4 oz (92.3 kg), SpO2 99 %.  Physical Exam  Constitutional: No distress.  HENT:  Head: Normocephalic and atraumatic.  Mouth/Throat: Oropharynx is clear and moist. No oropharyngeal exudate.  Eyes: Right eye exhibits no discharge. Left eye exhibits no discharge. Scleral icterus is present.  Cardiovascular: Normal rate, regular rhythm and normal heart sounds. Exam reveals no gallop and no friction rub.  No murmur heard.  Respiratory: Effort normal and breath sounds normal. No respiratory distress. He has no wheezes. He has no rales.  GI: Soft. Bowel sounds are normal. He exhibits no distension and no mass. There is no abdominal tenderness. There is no rebound and no guarding.  Musculoskeletal:  General: No edema.  Neurological: He is alert. Coordination normal.  The patient is ambulating with the use of a wheelchair.  Skin: Skin is warm and dry. No rash noted. He is not diaphoretic. No erythema.  Psychiatric: He has a normal mood and affect. His behavior is normal. Judgment and thought content normal.   Lab:  CBC Latest Ref Rng & Units 10/16/2019 10/14/2019 10/13/2019  WBC 4.0 - 10.5 K/uL 10.1 14.9(H) 16.1(H)  Hemoglobin 13.0 - 17.0 g/dL 9.2(L) 9.0(L) 10.2(L)  Hematocrit 39.0 - 52.0 % 29.1(L) 29.6(L) 32.9(L)  Platelets 150 - 400 K/uL 121(L) 153 201   CMP Latest Ref Rng & Units 10/16/2019 10/14/2019 10/13/2019  Glucose 70 - 99 mg/dL 111(H) 104(H) 128(H)  BUN 8 - 23 mg/dL 14 17 13   Creatinine 0.61 - 1.24 mg/dL 0.64 0.90 0.87  Sodium 135 - 145 mmol/L 137 133(L) 135  Potassium 3.5 - 5.1 mmol/L 3.6 3.6 4.2  Chloride 98 - 111 mmol/L 104 99 101  CO2 22 - 32 mmol/L 22 21(L) 21(L)  Calcium 8.9 - 10.3 mg/dL 7.9(L) 8.2(L) 8.9  Total Protein 6.5 - 8.1 g/dL 6.3(L)  6.4(L) 7.1  Total Bilirubin 0.3 - 1.2 mg/dL 1.1 3.1(H) 2.3(H)  Alkaline Phos 38 - 126 U/L 1,701(H) 1,932(H) 3,619(H)  AST 15 - 41 U/L 83(H) 264(H) 1,483(HH)  ALT 0 - 44 U/L 165(H) 322(H) 616(HH)   Results for orders placed or performed in visit on 10/16/19  Culture, Blood     Status: None (Preliminary result)   Collection Time: 10/16/19 12:50 PM   Specimen: BLOOD  Result Value Ref Range Status   Specimen Description BLOOD LEFT ANTECUBITAL  Final   Special Requests   Final    BOTTLES DRAWN AEROBIC AND ANAEROBIC Blood Culture adequate volume Performed at [facility], [place] [address]  [place], [place] [address]    Culture PENDING  Incomplete   Report Status PENDING  Incomplete  Culture, Blood     Status: None (Preliminary result)   Collection Time: 10/16/19 12:55 PM   Specimen: BLOOD  Result Value Ref Range Status   Specimen Description BLOOD PORTA CATH  Final   Special Requests   Final    BOTTLES DRAWN AEROBIC AND ANAEROBIC Blood Culture adequate volume Performed at [facility], [address], [place], [place] [address]    Culture PENDING  Incomplete   Report Status PENDING  Incomplete   Radiology results:  CT abdomen and pelvis w contrast 10/14/2019    IMPRESSION: 1. Interval progression of metastatic disease in the chest, abdomen and pelvis. 2. Circumferential thickening of the wall of the rectum with mild-to-moderate segmental narrowing and stricture of the rectal vault similar to prior CT. There is large amount of stool within the distal colon proximal to this narrowed segment. No evidence of small-bowel obstruction. Normal appendix. 3. Interval increase in the size of the low attenuating lesion in the uncinate process of the pancreas concerning for metastatic disease. A primary pancreatic malignancy is not entirely excluded. 4. Aortic Atherosclerosis (ICD10-I70.0).  Assessment/Plan   1) Klebsiella bacteremia: possible related to his recent CBD stent  placement, ?  Cholangiolitis -Billy Mendoza had blood cultures were collected today. He has been admitted and is being given IV Zosyn every 8 hours.  -will consult ID  2) Rectal cancer metastasized to liver: Billy Mendoza continues to be managed by [name] and is currently off treatment Mendoza to recent abnormal liver function 3) history of pulmonary emboli: The patient continues on Xarelto 10 mg once daily. Dose reduced Mendoza to recurrent GI bleeding  4)anemia of chronic disease  5) abnormal LFTs: secondary to liver metastasis and recent infection  4) Goals of care: The patient is being treated with palliative chemotherapy. He understands that the intent of therapy is not curative.  5) CODE STATUS: DNR/DNI   Harle Stanford, PA-C  10/16/2019, 2:28 PM   Addendum  I have seen the patient, examined him. I agree with the assessment and and plan and have edited the notes.   Billy Mendoza was seen in my office last week for dehydration, worsening fatigue and liver failure.  He went to emergency room on October 14, 2019 for fever, and blood culture came back positive for Klebsiella pneumonia.  The source is likely cholangiolitis after his recent CBD stent placement.  I spoke with ID Dr. Johnnye Sima today, and will start him on Zosyn.  I appreciate Dr. Netta Cedars input, antibiotics has been changed to cefepime, sensitivities still pending. Blood cultures were repeated today before first dose antibiotics.  I plan to repeat the blood culture later tomorrow or Wednesday morning. I reviewed his recent CT scan findings, no source of infection/abcess on scan, his urine culture was negative.  We will continue his home medication, resume MS Contin and oxycodone as needed. Mendoza to his worsening pain, will give extra dose MS contin today, and return to 23m q12h tomorrow.   Will add ambien as needed for ins insomnia.   Will continue supportive care.   He is DNI/DNR.  I spoke with his wife about the above, and also discussed the  care plan with his nurse at bed side.   YTruitt Merle 10/16/2019 4:47 PM  Cell [phone]

## 2019-10-16 NOTE — Consult Note (Addendum)
[facility]    Date of Admission:  10/16/2019   Total days of antibiotics: 0               Reason for Consult: Klebsiella bacteremia    Referring Provider: Annamaria Boots   Assessment: Klebsiella bacteremia Stage IVb rectal cancer (2019) Port in place Hepatitis, obstructive  Plan: 1. Start cefepime 2. Repeat BCx in AM 3. Hold on removing port 4.  consider imaging of his stent/liver 5. LFTs are better   Comment- His bacteremia seems consistent with his instrumentation/stent placement. Would try to maintain his port if we are able.  Recent notes show a consideration for hospice as he has not been able to have chemo.   Thank you so much for this interesting consult,  Active Problems:   * No active hospital problems. *     HPI: Billy Mendoza is a 69 y.o. male with rectal cancer with metastatic disease to liver, previous biliary stent (10-09-19). He has been off chemo for 1 month due to LFT elevation. Hwas seen in onc clinic on 1-8 with complaints of fever and drenching night sweats.  He was seen in ED 1-9 with further complaints of fever and fatigue.  He had BCx done at that time and has since been found to have K pneumoniae in his BCx (4/4). UCx (-).   He has a port (placed 08-2018 ).  He was called to ED 1-10  Today he complains of RUQ pain and would like his pain rx.   Review of Systems: Review of Systems  Constitutional: Positive for chills, fever and malaise/fatigue.  Respiratory: Negative for cough and shortness of breath.   Gastrointestinal: Positive for abdominal pain. Negative for constipation and diarrhea.  Please see HPI. All other systems reviewed and negative.   Past Medical History:  Diagnosis Date  . colorectal ca dx'd 07/2018  . Liver metastases ([place]) dx'd 07/2018  . Lung metastases ([place]) dx'd 07/2018    Social History   Tobacco Use  . Smoking status: Former Smoker    Years: 10.00    Types: Cigarettes, Cigars  .  Smokeless tobacco: Never Used  . Tobacco comment: quit in Sep 2019   Substance Use Topics  . Alcohol use: Yes    Alcohol/week: 1.0 - 2.0 standard drinks    Types: 1 - 2 Shots of liquor per week  . Drug use: Never    Family History  Problem Relation Age of Onset  . Cancer Mother 21       breast cancer  . Atrial fibrillation Father   . Heart failure Father   . Cancer Maternal Grandmother        lung cancer      Medications:  Scheduled: . docusate sodium  100 mg Oral BID  . magnesium oxide  200 mg Oral BID  . morphine  30 mg Oral Q12H  . multivitamin with minerals  1 tablet Oral Daily  . potassium chloride SA  20 mEq Oral Daily  . rivaroxaban  10 mg Oral Daily    Abtx:  Anti-infectives (From admission, onward)   Start     Dose/Rate Route Frequency Ordered Stop   10/16/19 1500  piperacillin-tazobactam (ZOSYN) IVPB 3.375 g     3.375 g 12.5 mL/hr over 240 Minutes Intravenous Every 8 hours 10/16/19 1404          OBJECTIVE: Blood pressure 114/81, pulse 69, temperature 99.3 F (37.4 C),  temperature source Oral, resp. rate 16, SpO2 99 %.  Physical Exam Constitutional:      General: He is not in acute distress.    Appearance: He is ill-appearing. He is not toxic-appearing or diaphoretic.  HENT:     Mouth/Throat:     Mouth: Mucous membranes are moist.     Pharynx: No oropharyngeal exudate.  Eyes:     Extraocular Movements: Extraocular movements intact.     Pupils: Pupils are equal, round, and reactive to light.  Cardiovascular:     Rate and Rhythm: Normal rate and regular rhythm.  Pulmonary:     Effort: Pulmonary effort is normal.     Breath sounds: Normal breath sounds.  Chest:    Abdominal:     General: Bowel sounds are normal. There is no distension.     Palpations: Abdomen is soft.     Tenderness: There is no abdominal tenderness. There is no guarding.  Musculoskeletal:     Cervical back: Normal range of motion and neck supple.     Right lower leg: No  edema.     Left lower leg: No edema.  Neurological:     General: No focal deficit present.     Mental Status: He is alert.  Psychiatric:        Mood and Affect: Mood normal.     Lab Results Results for orders placed or performed in visit on 10/16/19 (from the past 48 hour(s))  CBC with Differential ([facility] Only)     Status: Abnormal   Collection Time: 10/16/19 12:50 PM  Result Value Ref Range   WBC Count 10.1 4.0 - 10.5 K/uL   RBC 3.07 (L) 4.22 - 5.81 MIL/uL   Hemoglobin 9.2 (L) 13.0 - 17.0 g/dL   HCT 29.1 (L) 39.0 - 52.0 %   MCV 94.8 80.0 - 100.0 fL   MCH 30.0 26.0 - 34.0 pg   MCHC 31.6 30.0 - 36.0 g/dL   RDW 17.7 (H) 11.5 - 15.5 %   Platelet Count 121 (L) 150 - 400 K/uL   nRBC 0.0 0.0 - 0.2 %   Neutrophils Relative % 81 %   Neutro Abs 8.2 (H) 1.7 - 7.7 K/uL   Lymphocytes Relative 8 %   Lymphs Abs 0.8 0.7 - 4.0 K/uL   Monocytes Relative 9 %   Monocytes Absolute 0.9 0.1 - 1.0 K/uL   Eosinophils Relative 1 %   Eosinophils Absolute 0.1 0.0 - 0.5 K/uL   Basophils Relative 0 %   Basophils Absolute 0.0 0.0 - 0.1 K/uL   Immature Granulocytes 1 %   Abs Immature Granulocytes 0.07 0.00 - 0.07 K/uL    Comment: Performed at [facility], [address], [place], [place] [address]  CMP ([facility] only)     Status: Abnormal   Collection Time: 10/16/19 12:50 PM  Result Value Ref Range   Sodium 137 135 - 145 mmol/L   Potassium 3.6 3.5 - 5.1 mmol/L   Chloride 104 98 - 111 mmol/L   CO2 22 22 - 32 mmol/L   Glucose, Bld 111 (H) 70 - 99 mg/dL   BUN 14 8 - 23 mg/dL   Creatinine 0.64 0.61 - 1.24 mg/dL   Calcium 7.9 (L) 8.9 - 10.3 mg/dL   Total Protein 6.3 (L) 6.5 - 8.1 g/dL   Albumin 1.9 (L) 3.5 - 5.0 g/dL   AST 83 (H) 15 - 41 U/L   ALT 165 (H) 0 - 44 U/L  Alkaline Phosphatase 1,701 (H) 38 - 126 U/L   Total Bilirubin 1.1 0.3 - 1.2 mg/dL   GFR, Est Non Af Am >60 >60 mL/min   GFR, Est AFR Am >60 >60 mL/min   Anion gap 11 5 - 15    Comment:  Performed at [facility], [place] [address] [place], [place] [address]      Component Value Date/Time   SDES  10/15/2019 0106    IN/OUT CATH URINE Performed at [facility], [place] [address] [place], [place] [address]    SPECREQUEST  10/15/2019 0106    NONE Performed at [facility], [place] [address], [place], [place] [address]    CULT  10/15/2019 0106    NO GROWTH Performed at [facility] [address] [place], [place] [address]    REPTSTATUS 10/16/2019 FINAL 10/15/2019 0106   CT Abdomen Pelvis W Contrast  Result Date: 10/14/2019 CLINICAL DATA:  69 year old male with metastatic rectal cancer presents with fever and fatigue. Concern for intra-abdominal infection. EXAM: CT ABDOMEN AND PELVIS WITH CONTRAST TECHNIQUE: Multidetector CT imaging of the abdomen and pelvis was performed using the standard protocol following bolus administration of intravenous contrast. CONTRAST:  158mL OMNIPAQUE IOHEXOL 300 MG/ML  SOLN COMPARISON:  CT abdomen pelvis dated 08/30/2019. FINDINGS: Lower chest: Multiple pulmonary lesions noted primarily involving the visualized right lung and measuring up to 19 mm in the right lower lobe consistent with metastatic disease. These have increased in size since the prior CT. There is coronary vascular calcification. No intra-abdominal free air or free fluid. Hepatobiliary: Multiple hypodense lesions within the liver measuring up to 6.8 x 4.7 cm (previously 3.8 x 2.7 cm) in the dome of the liver most consistent with metastatic disease. Significant interval increase in the size of the liver metastasis since the prior CT. There is mild periportal edema versus dilatation of the intrahepatic biliary tree. High attenuating content gallbladder may represent sludge or small stones versus vicarious excretion of contrast. A biliary stent is in place. Pancreas: There is a 2 x 3 cm low attenuating lesion in the uncinate  process of the pancreas which has increased in size since the prior CT. This may represent metastatic disease within the pancreas or within a peripancreatic lymph node. A primary pancreatic malignancy is not entirely excluded. No dilatation of the main pancreatic duct. No active inflammatory changes. Spleen: Normal in size without focal abnormality. Adrenals/Urinary Tract: The adrenal glands are unremarkable. There is no hydronephrosis on either side. There is symmetric enhancement and excretion of contrast by both kidneys. Multiple bilateral renal cysts measure up to 3 cm in the interpolar aspect of the right kidney. The visualized ureters and urinary bladder appear unremarkable. Stomach/Bowel: There is circumferential thickening of the wall of the rectum measuring approximately 15 mm in thickness. There is haziness and stranding of the perirectal fat. Several small rounded nodular densities adjacent to the rectum measure up to 9 mm (series 2, image 91) and may represent mildly rounded lymph nodes. These findings are similar to prior CT. There is overall mild-to-moderate segmental narrowing and stricture of the rectal vault. There is large amount of stool in the distal colon proximal to this narrowed segment. No evidence of small-bowel obstruction. The appendix is normal. Vascular/Lymphatic: Mild aortoiliac atherosclerotic disease. No portal venous gas. Top-normal periportal lymph nodes measure up to 11 mm in short axis. Aortocaval hypodense lymph node measures 2.4 x 2.3 cm (coronal series 4, image 65) and  increased since the prior CT (previously measuring 1.3 x 1.4 cm). Reproductive: The prostate and seminal vesicles are grossly unremarkable. Other: Small fat containing umbilical hernia. Musculoskeletal: No acute or significant osseous findings. IMPRESSION: 1. Interval progression of metastatic disease in the chest, abdomen and pelvis. 2. Circumferential thickening of the wall of the rectum with mild-to-moderate  segmental narrowing and stricture of the rectal vault similar to prior CT. There is large amount of stool within the distal colon proximal to this narrowed segment. No evidence of small-bowel obstruction. Normal appendix. 3. Interval increase in the size of the low attenuating lesion in the uncinate process of the pancreas concerning for metastatic disease. A primary pancreatic malignancy is not entirely excluded. 4. Aortic Atherosclerosis (ICD10-I70.0). Electronically Signed   By: Anner Crete M.D.   On: 10/14/2019 22:27   DG Chest Port 1 View  Result Date: 10/14/2019 CLINICAL DATA:  69 year old male with fatigue and fever. EXAM: PORTABLE CHEST 1 VIEW COMPARISON:  CT of the chest abdomen pelvis dated 08/30/2019. FINDINGS: Right pectoral Port-A-Cath with tip at the cavoatrial junction. There is mild eventration of the right hemidiaphragm. No focal consolidation, pleural effusion, or pneumothorax. Top-normal cardiac silhouette. No acute osseous pathology. IMPRESSION: No active cardiopulmonary disease. Electronically Signed   By: Anner Crete M.D.   On: 10/14/2019 19:23   Recent Results (from the past 240 hour(s))  Blood Culture (routine x 2)     Status: Abnormal (Preliminary result)   Collection Time: 10/14/19  6:46 PM   Specimen: BLOOD  Result Value Ref Range Status   Specimen Description   Final    BLOOD PORTA CATH Performed at [facility] [address], [place], [place] [address]    Special Requests   Final    BOTTLES DRAWN AEROBIC AND ANAEROBIC Blood Culture adequate volume Performed at [facility] [address], [place], [place] [address]    Culture  Setup Time   Final    GRAM NEGATIVE RODS IN BOTH AEROBIC AND ANAEROBIC BOTTLES CRITICAL RESULT CALLED TO, READ BACK BY AND VERIFIED WITH: C. REED, RN (MCHP) AT [id number] ON 10/15/19 BY C. JESSUP, MT.    Culture (A)  Final    KLEBSIELLA PNEUMONIAE CULTURE REINCUBATED FOR BETTER GROWTH Performed  at [facility], [place] [address] [place], [place] [address]    Report Status PENDING  Incomplete  Blood Culture ID Panel (Reflexed)     Status: Abnormal   Collection Time: 10/14/19  6:46 PM  Result Value Ref Range Status   Enterococcus species NOT DETECTED NOT DETECTED Final   Listeria monocytogenes NOT DETECTED NOT DETECTED Final   Staphylococcus species NOT DETECTED NOT DETECTED Final   Staphylococcus aureus (BCID) NOT DETECTED NOT DETECTED Final   Streptococcus species NOT DETECTED NOT DETECTED Final   Streptococcus agalactiae NOT DETECTED NOT DETECTED Final   Streptococcus pneumoniae NOT DETECTED NOT DETECTED Final   Streptococcus pyogenes NOT DETECTED NOT DETECTED Final   Acinetobacter baumannii NOT DETECTED NOT DETECTED Final   Enterobacteriaceae species DETECTED (A) NOT DETECTED Final    Comment: Enterobacteriaceae represent a large family of gram-negative bacteria, not a single organism. CRITICAL RESULT CALLED TO, READ BACK BY AND VERIFIED WITH: C. REED, RN (MCHP) AT [id number] ON 10/15/19 BY C. JESSUP, MT.    Enterobacter cloacae complex NOT DETECTED NOT DETECTED Final   Escherichia coli NOT DETECTED NOT DETECTED Final   Klebsiella oxytoca NOT DETECTED NOT DETECTED Final   Klebsiella pneumoniae DETECTED (A) NOT  DETECTED Final    Comment: CRITICAL RESULT CALLED TO, READ BACK BY AND VERIFIED WITH: C. REED, RN (MCHP) AT [id number] ON 10/15/19 BY C. JESSUP, MT.    Proteus species NOT DETECTED NOT DETECTED Final   Serratia marcescens NOT DETECTED NOT DETECTED Final   Carbapenem resistance NOT DETECTED NOT DETECTED Final   Haemophilus influenzae NOT DETECTED NOT DETECTED Final   Neisseria meningitidis NOT DETECTED NOT DETECTED Final   Pseudomonas aeruginosa NOT DETECTED NOT DETECTED Final   Candida albicans NOT DETECTED NOT DETECTED Final   Candida glabrata NOT DETECTED NOT DETECTED Final   Candida krusei NOT DETECTED NOT DETECTED Final   Candida parapsilosis NOT DETECTED NOT DETECTED  Final   Candida tropicalis NOT DETECTED NOT DETECTED Final    Comment: Performed at [facility], [place] [address], [place], [place] [address]  Respiratory Panel by RT PCR (Flu A&B, Covid) - Nasopharyngeal Swab     Status: None   Collection Time: 10/14/19  6:48 PM   Specimen: Nasopharyngeal Swab  Result Value Ref Range Status   SARS Coronavirus 2 by RT PCR NEGATIVE NEGATIVE Final    Comment: (NOTE) SARS-CoV-2 target nucleic acids are NOT DETECTED. The SARS-CoV-2 RNA is generally detectable in upper respiratoy specimens during the acute phase of infection. The lowest concentration of SARS-CoV-2 viral copies this assay can detect is 131 copies/mL. A negative result does not preclude SARS-Cov-2 infection and should not be used as the sole basis for treatment or other patient management decisions. A negative result may occur with  improper specimen collection/handling, submission of specimen other than nasopharyngeal swab, presence of viral mutation(s) within the areas targeted by this assay, and inadequate number of viral copies (<131 copies/mL). A negative result must be combined with clinical observations, patient history, and epidemiological information. The expected result is Negative. Fact Sheet for Patients:  PinkCheek.be Fact Sheet for Healthcare Providers:  GravelBags.it This test is not yet ap proved or cleared by the [place] FDA and  has been authorized for detection and/or diagnosis of SARS-CoV-2 by FDA under an Emergency Use Authorization (EUA). This EUA will remain  in effect (meaning this test can be used) for the duration of the COVID-19 declaration under Section 564(b)(1) of the Act, 21 U.S.C. section 360bbb-3(b)(1), unless the authorization is terminated or revoked sooner.    Influenza A by PCR NEGATIVE NEGATIVE Final   Influenza B by PCR NEGATIVE NEGATIVE Final    Comment: (NOTE) The Xpert Xpress  SARS-CoV-2/FLU/RSV assay is intended as an aid in  the diagnosis of influenza from Nasopharyngeal swab specimens and  should not be used as a sole basis for treatment. Nasal washings and  aspirates are unacceptable for Xpert Xpress SARS-CoV-2/FLU/RSV  testing. Fact Sheet for Patients: PinkCheek.be Fact Sheet for Healthcare Providers: GravelBags.it This test is not yet approved or cleared by the [place] FDA and  has been authorized for detection and/or diagnosis of SARS-CoV-2 by  FDA under an Emergency Use Authorization (EUA). This EUA will remain  in effect (meaning this test can be used) for the duration of the  Covid-19 declaration under Section 564(b)(1) of the Act, 21  U.S.C. section 360bbb-3(b)(1), unless the authorization is  terminated or revoked. Performed at [facility], [place] [address] [place], [place] [address]   Blood Culture (routine x 2)     Status: None (Preliminary result)   Collection Time: 10/14/19  6:51 PM   Specimen: BLOOD LEFT HAND  Result Value Ref Range  Status   Specimen Description   Final    BLOOD LEFT HAND Performed at [facility] [address], [place], [place] [address]    Special Requests   Final    BOTTLES DRAWN AEROBIC AND ANAEROBIC Blood Culture adequate volume Performed at [facility] [address] [place], [place] [address]    Culture  Setup Time   Final    GRAM NEGATIVE RODS IN BOTH AEROBIC AND ANAEROBIC BOTTLES CRITICAL RESULT CALLED TO, READ BACK BY AND VERIFIED WITH: C. REED, RN (MCHP) AT [id number] ON 10/15/19 BY C. JESSUP, MT.    Culture   Final    GRAM NEGATIVE RODS IDENTIFICATION TO FOLLOW Performed at [facility], [place] [address] [place], [place] [address]    Report Status PENDING  Incomplete  Urine culture     Status: None   Collection Time: 10/15/19  1:06 AM   Specimen: In/Out Cath Urine  Result Value  Ref Range Status   Specimen Description   Final    IN/OUT CATH URINE Performed at [facility] [address], [place], [place] [address]    Special Requests   Final    NONE Performed at [facility], [place] [address] [place], [place] [address]    Culture   Final    NO GROWTH Performed at [facility], [place] [address], [place], [place] [address]    Report Status 10/16/2019 FINAL  Final    Microbiology: Recent Results (from the past 240 hour(s))  Blood Culture (routine x 2)     Status: Abnormal (Preliminary result)   Collection Time: 10/14/19  6:46 PM   Specimen: BLOOD  Result Value Ref Range Status   Specimen Description   Final    BLOOD PORTA CATH Performed at [facility] [address] [place], [place] [address]    Special Requests   Final    BOTTLES DRAWN AEROBIC AND ANAEROBIC Blood Culture adequate volume Performed at [facility] [address], [place], [place] [address]    Culture  Setup Time   Final    GRAM NEGATIVE RODS IN BOTH AEROBIC AND ANAEROBIC BOTTLES CRITICAL RESULT CALLED TO, READ BACK BY AND VERIFIED WITH: C. REED, RN (MCHP) AT [id number] ON 10/15/19 BY C. JESSUP, MT.    Culture (A)  Final    KLEBSIELLA PNEUMONIAE CULTURE REINCUBATED FOR BETTER GROWTH Performed at [facility], [place] [address] [place],  [address]    Report Status PENDING  Incomplete  Blood Culture ID Panel (Reflexed)     Status: Abnormal   Collection Time: 10/14/19  6:46 PM  Result Value Ref Range Status   Enterococcus species NOT DETECTED NOT DETECTED Final   Listeria monocytogenes NOT DETECTED NOT DETECTED Final   Staphylococcus species NOT DETECTED NOT DETECTED Final   Staphylococcus aureus (BCID) NOT DETECTED NOT DETECTED Final   Streptococcus species NOT DETECTED NOT DETECTED Final   Streptococcus agalactiae NOT DETECTED NOT DETECTED Final   Streptococcus pneumoniae NOT DETECTED NOT  DETECTED Final   Streptococcus pyogenes NOT DETECTED NOT DETECTED Final   Acinetobacter baumannii NOT DETECTED NOT DETECTED Final   Enterobacteriaceae species DETECTED (A) NOT DETECTED Final    Comment: Enterobacteriaceae represent a large family of gram-negative bacteria, not a single organism. CRITICAL RESULT CALLED TO, READ BACK BY AND VERIFIED WITH: C. REED, RN (MCHP) AT [id number] ON 10/15/19 BY C. JESSUP, MT.    Enterobacter  cloacae complex NOT DETECTED NOT DETECTED Final   Escherichia coli NOT DETECTED NOT DETECTED Final   Klebsiella oxytoca NOT DETECTED NOT DETECTED Final   Klebsiella pneumoniae DETECTED (A) NOT DETECTED Final    Comment: CRITICAL RESULT CALLED TO, READ BACK BY AND VERIFIED WITH: C. REED, RN (MCHP) AT 1733 ON 10/15/19 BY C. JESSUP, MT.    Proteus species NOT DETECTED NOT DETECTED Final   Serratia marcescens NOT DETECTED NOT DETECTED Final   Carbapenem resistance NOT DETECTED NOT DETECTED Final   Haemophilus influenzae NOT DETECTED NOT DETECTED Final   Neisseria meningitidis NOT DETECTED NOT DETECTED Final   Pseudomonas aeruginosa NOT DETECTED NOT DETECTED Final   Candida albicans NOT DETECTED NOT DETECTED Final   Candida glabrata NOT DETECTED NOT DETECTED Final   Candida krusei NOT DETECTED NOT DETECTED Final   Candida parapsilosis NOT DETECTED NOT DETECTED Final   Candida tropicalis NOT DETECTED NOT DETECTED Final    Comment: Performed at [facility], [place] [address], [place], [place] [address]  Respiratory Panel by RT PCR (Flu A&B, Covid) - Nasopharyngeal Swab     Status: None   Collection Time: 10/14/19  6:48 PM   Specimen: Nasopharyngeal Swab  Result Value Ref Range Status   SARS Coronavirus 2 by RT PCR NEGATIVE NEGATIVE Final    Comment: (NOTE) SARS-CoV-2 target nucleic acids are NOT DETECTED. The SARS-CoV-2 RNA is generally detectable in upper respiratoy specimens during the acute phase of infection. The lowest concentration of SARS-CoV-2 viral copies  this assay can detect is 131 copies/mL. A negative result does not preclude SARS-Cov-2 infection and should not be used as the sole basis for treatment or other patient management decisions. A negative result may occur with  improper specimen collection/handling, submission of specimen other than nasopharyngeal swab, presence of viral mutation(s) within the areas targeted by this assay, and inadequate number of viral copies (<131 copies/mL). A negative result must be combined with clinical observations, patient history, and epidemiological information. The expected result is Negative. Fact Sheet for Patients:  PinkCheek.be Fact Sheet for Healthcare Providers:  GravelBags.it This test is not yet ap proved or cleared by the [place] FDA and  has been authorized for detection and/or diagnosis of SARS-CoV-2 by FDA under an Emergency Use Authorization (EUA). This EUA will remain  in effect (meaning this test can be used) for the duration of the COVID-19 declaration under Section 564(b)(1) of the Act, 21 U.S.C. section 360bbb-3(b)(1), unless the authorization is terminated or revoked sooner.    Influenza A by PCR NEGATIVE NEGATIVE Final   Influenza B by PCR NEGATIVE NEGATIVE Final    Comment: (NOTE) The Xpert Xpress SARS-CoV-2/FLU/RSV assay is intended as an aid in  the diagnosis of influenza from Nasopharyngeal swab specimens and  should not be used as a sole basis for treatment. Nasal washings and  aspirates are unacceptable for Xpert Xpress SARS-CoV-2/FLU/RSV  testing. Fact Sheet for Patients: PinkCheek.be Fact Sheet for Healthcare Providers: GravelBags.it This test is not yet approved or cleared by the [place] FDA and  has been authorized for detection and/or diagnosis of SARS-CoV-2 by  FDA under an Emergency Use Authorization (EUA). This EUA will remain  in  effect (meaning this test can be used) for the duration of the  Covid-19 declaration under Section 564(b)(1) of the Act, 21  U.S.C. section 360bbb-3(b)(1), unless the authorization is  terminated or revoked. Performed at [facility], [place] [address], [place], [place] [address]  Blood Culture (routine x 2)     Status: None (Preliminary result)   Collection Time: 10/14/19  6:51 PM   Specimen: BLOOD LEFT HAND  Result Value Ref Range Status   Specimen Description   Final    BLOOD LEFT HAND Performed at [facility] [address], [place], [place] [address]    Special Requests   Final    BOTTLES DRAWN AEROBIC AND ANAEROBIC Blood Culture adequate volume Performed at [facility] [address] [place], [place] [address]    Culture  Setup Time   Final    GRAM NEGATIVE RODS IN BOTH AEROBIC AND ANAEROBIC BOTTLES CRITICAL RESULT CALLED TO, READ BACK BY AND VERIFIED WITH: C. REED, RN (MCHP) AT [id number] ON 10/15/19 BY C. JESSUP, MT.    Culture   Final    GRAM NEGATIVE RODS IDENTIFICATION TO FOLLOW Performed at [facility], [place] [address] [place], [place] [address]    Report Status PENDING  Incomplete  Urine culture     Status: None   Collection Time: 10/15/19  1:06 AM   Specimen: In/Out Cath Urine  Result Value Ref Range Status   Specimen Description   Final    IN/OUT CATH URINE Performed at [facility] [address], [place], [place] [address]    Special Requests   Final    NONE Performed at [facility], [place] [address] [place], [place] [address]    Culture   Final    NO GROWTH Performed at [facility], [place] [address] [place], [place] [address]    Report Status 10/16/2019 FINAL  Final    Radiographs and labs were personally reviewed by me.   Bobby Rumpf, MD [facility] [phone] 10/16/2019, 2:25  PM

## 2019-10-17 ENCOUNTER — Encounter (HOSPITAL_COMMUNITY): Payer: Self-pay | Admitting: Hematology

## 2019-10-17 DIAGNOSIS — Z95828 Presence of other vascular implants and grafts: Secondary | ICD-10-CM

## 2019-10-17 DIAGNOSIS — K759 Inflammatory liver disease, unspecified: Secondary | ICD-10-CM

## 2019-10-17 LAB — COMPREHENSIVE METABOLIC PANEL
ALT: 114 U/L — ABNORMAL HIGH (ref 0–44)
AST: 69 U/L — ABNORMAL HIGH (ref 15–41)
Albumin: 1.9 g/dL — ABNORMAL LOW (ref 3.5–5.0)
Alkaline Phosphatase: 1300 U/L — ABNORMAL HIGH (ref 38–126)
Anion gap: 7 (ref 5–15)
BUN: 9 mg/dL (ref 8–23)
CO2: 23 mmol/L (ref 22–32)
Calcium: 7.8 mg/dL — ABNORMAL LOW (ref 8.9–10.3)
Chloride: 105 mmol/L (ref 98–111)
Creatinine, Ser: 0.61 mg/dL (ref 0.61–1.24)
GFR calc Af Amer: >60 mL/min (ref >60)
GFR calc non Af Amer: >60 mL/min (ref >60)
Glucose, Bld: 95 mg/dL (ref 70–99)
Potassium: 3.9 mmol/L (ref 3.5–5.1)
Sodium: 135 mmol/L (ref 135–145)
Total Bilirubin: 1.3 mg/dL — ABNORMAL HIGH (ref 0.3–1.2)
Total Protein: 5.8 g/dL — ABNORMAL LOW (ref 6.5–8.1)

## 2019-10-17 LAB — CBC WITH DIFFERENTIAL/PLATELET
Abs Immature Granulocytes: 0.08 10*3/uL — ABNORMAL HIGH (ref 0.00–0.07)
Basophils Absolute: 0 10*3/uL (ref 0.0–0.1)
Basophils Relative: 0 %
Eosinophils Absolute: 0.1 10*3/uL (ref 0.0–0.5)
Eosinophils Relative: 1 %
HCT: 26.2 % — ABNORMAL LOW (ref 39.0–52.0)
Hemoglobin: 8 g/dL — ABNORMAL LOW (ref 13.0–17.0)
Immature Granulocytes: 1 %
Lymphocytes Relative: 10 %
Lymphs Abs: 1.1 10*3/uL (ref 0.7–4.0)
MCH: 30 pg (ref 26.0–34.0)
MCHC: 30.5 g/dL (ref 30.0–36.0)
MCV: 98.1 fL (ref 80.0–100.0)
Monocytes Absolute: 1 10*3/uL (ref 0.1–1.0)
Monocytes Relative: 9 %
Neutro Abs: 8.4 10*3/uL — ABNORMAL HIGH (ref 1.7–7.7)
Neutrophils Relative %: 79 %
Platelets: 130 10*3/uL — ABNORMAL LOW (ref 150–400)
RBC: 2.67 MIL/uL — ABNORMAL LOW (ref 4.22–5.81)
RDW: 18 % — ABNORMAL HIGH (ref 11.5–15.5)
WBC: 10.7 10*3/uL — ABNORMAL HIGH (ref 4.0–10.5)
nRBC: 0 % (ref 0.0–0.2)

## 2019-10-17 LAB — PROTIME-INR
INR: 1.8 — ABNORMAL HIGH (ref 0.8–1.2)
Prothrombin Time: 20.5 seconds — ABNORMAL HIGH (ref 11.4–15.2)

## 2019-10-17 LAB — GLUCOSE, CAPILLARY: Glucose-Capillary: 89 mg/dL (ref 70–99)

## 2019-10-17 LAB — MAGNESIUM: Magnesium: 2 mg/dL (ref 1.7–2.4)

## 2019-10-17 MED ORDER — CHLORHEXIDINE GLUCONATE CLOTH 2 % EX PADS
6.0000 | MEDICATED_PAD | Freq: Every day | CUTANEOUS | Status: DC
  Administered 2019-10-17 – 2019-10-19 (×3): 6 via TOPICAL

## 2019-10-17 MED ORDER — ENSURE ENLIVE PO LIQD
237.0000 mL | Freq: Two times a day (BID) | ORAL | Status: DC
  Administered 2019-10-17 – 2019-10-19 (×6): 237 mL via ORAL

## 2019-10-17 NOTE — Progress Notes (Signed)
Initial Nutrition Assessment  INTERVENTION:   -Ensure Enlive po BID, each supplement provides 350 kcal and 20 grams of protein  NUTRITION DIAGNOSIS:   Increased nutrient needs related to cancer and cancer related treatments as evidenced by estimated needs.  GOAL:   Patient will meet greater than or equal to 90% of their needs  MONITOR:   PO intake, Supplement acceptance, Labs, Weight trends, I & O's  REASON FOR ASSESSMENT:   Malnutrition Screening Tool    ASSESSMENT:   69 year old male with a history of a metastatic rectal cancer with liver metastasis who is managed by Elson Clan. He was most recently seen in the emergency room at [facility] on 10/14/2019 after having had a fever on Saturday morning of 100.6. Admitted for Klebsiella bacteremia.      **RD working remotely**  Pt currently undergoing chemotherapy for met rectal cancer. Pt admitted with fever, pt reports he has been having poor appetite.  No PO documented yet for this admission. Will likely benefit from nutrition supplements.   Per weight records, pt has lost 27 lbs since 06/22/19 (11% wt loss x 4 months, significant for time frame).  I/Os: +1.4L since admit  Labs reviewed. Medications: MAG-OX tablet, Multivitamin with minerals daily, KLOR-CON   NUTRITION - FOCUSED PHYSICAL EXAM:  Working remotely.  Diet Order:   Diet Order            Diet regular Room service appropriate? Yes; Fluid consistency: Thin  Diet effective now              EDUCATION NEEDS:   No education needs have been identified at this time  Skin:  Skin Assessment: Reviewed RN Assessment  Last BM:  1/11  Height:   Ht Readings from Last 1 Encounters:  10/16/19 6' (1.829 m)    Weight:   Wt Readings from Last 1 Encounters:  10/16/19 92.3 kg    Ideal Body Weight:  80.9 kg  BMI:  27.5 kg/m^2  Estimated Nutritional Needs:   Kcal:  2300-2500  Protein:  120-130g  Fluid:  2.3L/day  Clayton Bibles, MS, RD,  LDN Inpatient Clinical Dietitian Pager: [phone] After Hours Pager: [phone]

## 2019-10-17 NOTE — Progress Notes (Signed)
INFECTIOUS DISEASE PROGRESS NOTE  ID: Billy Mendoza is a 69 y.o. male with  Active Problems:   Bacteremia due to Klebsiella pneumoniae  Subjective: No complaints, fatigued.  Has been sleeping all AM  Abtx:  Anti-infectives (From admission, onward)   Start     Dose/Rate Route Frequency Ordered Stop   10/16/19 1600  ceFEPIme (MAXIPIME) 2 g in sodium chloride 0.9 % 100 mL IVPB     2 g 200 mL/hr over 30 Minutes Intravenous Every 8 hours 10/16/19 1518     10/16/19 1500  piperacillin-tazobactam (ZOSYN) IVPB 3.375 g  Status:  Discontinued     3.375 g 12.5 mL/hr over 240 Minutes Intravenous Every 8 hours 10/16/19 1404 10/16/19 1516      Medications:  Scheduled: . Chlorhexidine Gluconate Cloth  6 each Topical Daily  . docusate sodium  100 mg Oral BID  . feeding supplement (ENSURE ENLIVE)  237 mL Oral BID BM  . magnesium oxide  200 mg Oral BID  . morphine  30 mg Oral Q12H  . multivitamin with minerals  1 tablet Oral Daily  . potassium chloride SA  20 mEq Oral Daily  . rivaroxaban  10 mg Oral Daily    Objective: Vital signs in last 24 hours: Temp:  [98.3 F (36.8 C)-99.3 F (37.4 C)] 98.3 F (36.8 C) (01/11 2108) Pulse Rate:  [69-85] 85 (01/11 2108) Resp:  [16-18] 16 (01/11 1353) BP: (113-142)/(79-90) 142/90 (01/11 2108) SpO2:  [96 %-99 %] 96 % (01/11 2108) Weight:  [92.3 kg] 92.3 kg (01/11 1240)   General appearance: cooperative and no distress Resp: clear to auscultation bilaterally Chest wall: no tenderness, port is clean, no fluctuance, no erythema.  Cardio: regular rate and rhythm GI: normal findings: bowel sounds normal and soft, non-tender  Lab Results Recent Labs    10/16/19 1250 10/17/19 0508  WBC 10.1 10.7*  HGB 9.2* 8.0*  HCT 29.1* 26.2*  NA 137 135  K 3.6 3.9  CL 104 105  CO2 22 23  BUN 14 9  CREATININE 0.64 0.61   Liver Panel Recent Labs    10/16/19 1250 10/17/19 0508  PROT 6.3* 5.8*  ALBUMIN 1.9* 1.9*  AST 83* 69*  ALT 165* 114*    ALKPHOS 1,701* 1,300*  BILITOT 1.1 1.3*   Sedimentation Rate No results for input(s): ESRSEDRATE in the last 72 hours. C-Reactive Protein No results for input(s): CRP in the last 72 hours.  Microbiology: Recent Results (from the past 240 hour(s))  Blood Culture (routine x 2)     Status: Abnormal (Preliminary result)   Collection Time: 10/14/19  6:46 PM   Specimen: BLOOD  Result Value Ref Range Status   Specimen Description   Final    BLOOD PORTA CATH Performed at [facility] [address] [place], [place] [address]    Special Requests   Final    BOTTLES DRAWN AEROBIC AND ANAEROBIC Blood Culture adequate volume Performed at [facility] [address], [place], [place] [address]    Culture  Setup Time   Final    GRAM NEGATIVE RODS IN BOTH AEROBIC AND ANAEROBIC BOTTLES CRITICAL RESULT CALLED TO, READ BACK BY AND VERIFIED WITH: C. REED, RN (MCHP) AT [id number] ON 10/15/19 BY C. JESSUP, MT. Performed at [facility], [place] [address] [place], [place] [address]    Culture KLEBSIELLA PNEUMONIAE (A)  Final   Report Status PENDING  Incomplete   Organism ID, Bacteria KLEBSIELLA PNEUMONIAE  Final      Susceptibility   Klebsiella pneumoniae - MIC*    AMPICILLIN >=32 RESISTANT Resistant     CEFAZOLIN <=4 SENSITIVE Sensitive     CEFEPIME <=0.12 SENSITIVE Sensitive     CEFTAZIDIME <=1 SENSITIVE Sensitive     CEFTRIAXONE <=0.25 SENSITIVE Sensitive     CIPROFLOXACIN <=0.25 SENSITIVE Sensitive     GENTAMICIN <=1 SENSITIVE Sensitive     IMIPENEM <=0.25 SENSITIVE Sensitive     TRIMETH/SULFA <=20 SENSITIVE Sensitive     AMPICILLIN/SULBACTAM 4 SENSITIVE Sensitive     PIP/TAZO <=4 SENSITIVE Sensitive     * KLEBSIELLA PNEUMONIAE  Blood Culture ID Panel (Reflexed)     Status: Abnormal   Collection Time: 10/14/19  6:46 PM  Result Value Ref Range Status   Enterococcus species NOT DETECTED NOT DETECTED Final   Listeria monocytogenes NOT DETECTED NOT  DETECTED Final   Staphylococcus species NOT DETECTED NOT DETECTED Final   Staphylococcus aureus (BCID) NOT DETECTED NOT DETECTED Final   Streptococcus species NOT DETECTED NOT DETECTED Final   Streptococcus agalactiae NOT DETECTED NOT DETECTED Final   Streptococcus pneumoniae NOT DETECTED NOT DETECTED Final   Streptococcus pyogenes NOT DETECTED NOT DETECTED Final   Acinetobacter baumannii NOT DETECTED NOT DETECTED Final   Enterobacteriaceae species DETECTED (A) NOT DETECTED Final    Comment: Enterobacteriaceae represent a large family of gram-negative bacteria, not a single organism. CRITICAL RESULT CALLED TO, READ BACK BY AND VERIFIED WITH: C. REED, RN (MCHP) AT [id number] ON 10/15/19 BY C. JESSUP, MT.    Enterobacter cloacae complex NOT DETECTED NOT DETECTED Final   Escherichia coli NOT DETECTED NOT DETECTED Final   Klebsiella oxytoca NOT DETECTED NOT DETECTED Final   Klebsiella pneumoniae DETECTED (A) NOT DETECTED Final    Comment: CRITICAL RESULT CALLED TO, READ BACK BY AND VERIFIED WITH: C. REED, RN (MCHP) AT 1733 ON 10/15/19 BY C. JESSUP, MT.    Proteus species NOT DETECTED NOT DETECTED Final   Serratia marcescens NOT DETECTED NOT DETECTED Final   Carbapenem resistance NOT DETECTED NOT DETECTED Final   Haemophilus influenzae NOT DETECTED NOT DETECTED Final   Neisseria meningitidis NOT DETECTED NOT DETECTED Final   Pseudomonas aeruginosa NOT DETECTED NOT DETECTED Final   Candida albicans NOT DETECTED NOT DETECTED Final   Candida glabrata NOT DETECTED NOT DETECTED Final   Candida krusei NOT DETECTED NOT DETECTED Final   Candida parapsilosis NOT DETECTED NOT DETECTED Final   Candida tropicalis NOT DETECTED NOT DETECTED Final    Comment: Performed at [facility], [place] [address], [place], [place] [address]  Respiratory Panel by RT PCR (Flu A&B, Covid) - Nasopharyngeal Swab     Status: None   Collection Time: 10/14/19  6:48 PM   Specimen: Nasopharyngeal Swab  Result Value Ref Range  Status   SARS Coronavirus 2 by RT PCR NEGATIVE NEGATIVE Final    Comment: (NOTE) SARS-CoV-2 target nucleic acids are NOT DETECTED. The SARS-CoV-2 RNA is generally detectable in upper respiratoy specimens during the acute phase of infection. The lowest concentration of SARS-CoV-2 viral copies this assay can detect is 131 copies/mL. A negative result does not preclude SARS-Cov-2 infection and should not be used as the sole basis for treatment or other patient management decisions. A negative result may occur with  improper specimen collection/handling, submission of specimen other than nasopharyngeal swab, presence of viral mutation(s) within the areas targeted by this assay, and inadequate number of viral copies (<131 copies/mL). A negative result must be  combined with clinical observations, patient history, and epidemiological information. The expected result is Negative. Fact Sheet for Patients:  PinkCheek.be Fact Sheet for Healthcare Providers:  GravelBags.it This test is not yet ap proved or cleared by the [place] FDA and  has been authorized for detection and/or diagnosis of SARS-CoV-2 by FDA under an Emergency Use Authorization (EUA). This EUA will remain  in effect (meaning this test can be used) for the duration of the COVID-19 declaration under Section 564(b)(1) of the Act, 21 U.S.C. section 360bbb-3(b)(1), unless the authorization is terminated or revoked sooner.    Influenza A by PCR NEGATIVE NEGATIVE Final   Influenza B by PCR NEGATIVE NEGATIVE Final    Comment: (NOTE) The Xpert Xpress SARS-CoV-2/FLU/RSV assay is intended as an aid in  the diagnosis of influenza from Nasopharyngeal swab specimens and  should not be used as a sole basis for treatment. Nasal washings and  aspirates are unacceptable for Xpert Xpress SARS-CoV-2/FLU/RSV  testing. Fact Sheet for  Patients: PinkCheek.be Fact Sheet for Healthcare Providers: GravelBags.it This test is not yet approved or cleared by the [place] FDA and  has been authorized for detection and/or diagnosis of SARS-CoV-2 by  FDA under an Emergency Use Authorization (EUA). This EUA will remain  in effect (meaning this test can be used) for the duration of the  Covid-19 declaration under Section 564(b)(1) of the Act, 21  U.S.C. section 360bbb-3(b)(1), unless the authorization is  terminated or revoked. Performed at [facility], [place] [address] [place], [place] [address]   Blood Culture (routine x 2)     Status: Abnormal (Preliminary result)   Collection Time: 10/14/19  6:51 PM   Specimen: BLOOD LEFT HAND  Result Value Ref Range Status   Specimen Description   Final    BLOOD LEFT HAND Performed at [facility] [address], [place], [place] [address]    Special Requests   Final    BOTTLES DRAWN AEROBIC AND ANAEROBIC Blood Culture adequate volume Performed at [facility] [address] [place], [place] [address]    Culture  Setup Time   Final    GRAM NEGATIVE RODS IN BOTH AEROBIC AND ANAEROBIC BOTTLES CRITICAL RESULT CALLED TO, READ BACK BY AND VERIFIED WITH: C. REED, RN (MCHP) AT [id number] ON 10/15/19 BY C. JESSUP, MT. Performed at [facility], [place] [address] [place], [place] [address]    Culture KLEBSIELLA PNEUMONIAE (A)  Final   Report Status PENDING  Incomplete  Urine culture     Status: None   Collection Time: 10/15/19  1:06 AM   Specimen: In/Out Cath Urine  Result Value Ref Range Status   Specimen Description   Final    IN/OUT CATH URINE Performed at [facility] [address] [place], [place] [address]    Special Requests   Final    NONE Performed at [facility], [place] [address], [place], [place] [address]    Culture    Final    NO GROWTH Performed at [facility], [place] [address], [place], [place] [address]    Report Status 10/16/2019 FINAL  Final  Culture, Blood     Status: None (Preliminary result)   Collection Time: 10/16/19 12:50 PM   Specimen: BLOOD  Result Value Ref Range Status   Specimen Description BLOOD LEFT ANTECUBITAL  Final   Special Requests   Final    BOTTLES DRAWN AEROBIC AND ANAEROBIC Blood  Culture adequate volume Performed at [facility], [place] [address] [place], [place] [address]    Culture PENDING  Incomplete   Report Status PENDING  Incomplete  Culture, Blood     Status: None (Preliminary result)   Collection Time: 10/16/19 12:55 PM   Specimen: BLOOD  Result Value Ref Range Status   Specimen Description BLOOD PORTA CATH  Final   Special Requests   Final    BOTTLES DRAWN AEROBIC AND ANAEROBIC Blood Culture adequate volume Performed at [facility] [address], [place], [place] [address]    Culture PENDING  Incomplete   Report Status PENDING  Incomplete    Studies/Results: No results found.   Assessment/Plan: Klebsiella bacteremia Stage IVb rectal cancer (2019) Port in place Hepatitis, obstructive  Total days of antibiotics: 1 cefepime  CT liver does not show abscess Continue cefepime, supported by his sensitivities Await repeat BCx (may need to repeat seprate from 1-11 as he had gotten minimal to no anbx at that point).          Bobby Rumpf MD, FACP Infectious Diseases (pager) [phone] [email or web address] 10/17/2019, 11:11 AM  LOS: 1 day

## 2019-10-17 NOTE — Progress Notes (Addendum)
HEMATOLOGY-ONCOLOGY PROGRESS NOTE  SUBJECTIVE: Feels well this morning.  He has no complaints of pain.  Denies nausea and vomiting.  No constipation or diarrhea.  He remains afebrile and other vital signs are stable.  Oncology History Overview Note  Cancer Staging Rectal cancer metastasized to liver [facility]) Staging form: Colon and Rectum, AJCC 8th Edition - Clinical stage from 07/28/2018: Stage IVB (cTX, cNX, pM1b) - Signed by Truitt Merle, MD on 08/18/2018     Rectal cancer metastasized to liver ([place])  07/28/2018 Initial Biopsy   Biopsy 07/28/18 Final Microscopic Diagnosis A.Colon, Ascending, Polyp:  -Fragments of tubular adenoma -No high-grade dysplasia of malignancy.  B. Rectum, Mass, Polyp:  -Invasive adenocarcinoma, moderately - poorly differentiatred.    07/28/2018 Cancer Staging   Staging form: Colon and Rectum, AJCC 8th Edition - Clinical stage from 07/28/2018: Stage IVB (cTX, cNX, pM1b) - Signed by Truitt Merle, MD on 08/18/2018   08/04/2018 Initial Diagnosis   Rectal cancer metastasized to liver ([place])   08/16/2018 Pathology Results   Biospy  Diagnosis 08/16/18 Liver, needle/core biopsy - ADENOCARCINOMA. Microscopic Comment By morphology, the findings are compatible with the provided clinical history of colorectal adenocarcinoma. Results reported to [name] on 08/18/2018. Intradepartmental consultation was obtained ([name]).    08/16/2018 Miscellaneous   Foundation 1 genomic testing: MS-stable Tumor mutation burden  3/Mb K-ras/NRAS wild-type BRAF V600 E KDMSA R266Q PTEN T358f* SMAD4 loss TP53 loss     08/18/2018 Procedure   Colonoscopy by Dr. HBenson Norwayshowed malignant appearing partially obstructing tumor in the rectum, 8 cm above anal verge.   08/18/2018 - 09/07/2018 Chemotherapy   CAPOX q3weeks with Xeloda 2005mBID 2 weeks on, 1 weeks off starting 08/18/18.  Swithced treatment due to BRAF mutation.    09/07/2018 - 12/24/2018 Chemotherapy   FOLFOXIRI  and avastin every 2 weeks starting 09/07/18. Oxaliplatin held starting with cycle 8 and continue with FOLFIRI and Avastin. D/c due to disease progression.    11/04/2018 Imaging   CT CAP 11/04/18 IMPRESSION: 1. Peripheral filling defect in the right main pulmonary artery extending primarily into the right lower lobe pulmonary artery. While the peripheral location indicates some degree of chronicity of this pulmonary embolus, this is a new finding compared to 07/28/2018. The right ventricular to left ventricular ratio is 0.7, which does not specifically indicate right heart strain. 2. The malignancy appears considerably improved, with reduced size pulmonary nodules; reduced size and increased internal necrosis of the hepatic metastatic lesions; marked improvement in the thoracic adenopathy; reduced conspicuity of the rectal tumor; and significant reduction in perirectal tumor burden/adenopathy. 3. Other imaging findings of potential clinical significance: Aortic Atherosclerosis (ICD10-I70.0). Coronary atherosclerosis. Bilateral renal cysts. Small umbilical hernia contains adipose tissue.  Critical Value/emergent results were called by telephone at the time of interpretation on 11/04/2018 at 3:05 pm to Dr. YATruitt Merle who verbally acknowledged these results.   01/18/2019 Imaging   CT AP W contrast  IMPRESSION: 1. Colonic obstruction resulting from rectal carcinoma. 2. Hepatic metastatic disease which is decreased in size compared with the prior examination of 11/04/2018. 3. Multiple right middle lobe and right lower lobe pulmonary nodules most consistent with metastatic disease.   02/01/2019 - 06/01/2019 Chemotherapy   second line BRAFTOVI(encorafenib) 30046maily and MEKTOVI(binimetinib), 37m16m2h and Vectibix q2weeks starting 02/01/19.  -[facility] due to RT  -Stopped on 06/01/19 due to disease progression    02/13/2019 Imaging   CT Chest  IMPRESSION: Small  bilateral pulmonary metastases,  mildly progressive from January 2020.  Multifocal hepatic metastases, incompletely visualized, favored to be progressive from April 2020.  Aortic Atherosclerosis (ICD10-I70.0).    02/22/2019 - 03/21/2019 Radiation Therapy   -Palliative local radiation  -Holding mektovi (binimetinib) during RT -Due to significant rectal pain, diarrhea and stool incontinence, he stopped RT after 03/13/19. He was only able to finish 1/2 remianing treatments on 03/21/19.    05/31/2019 Imaging   CT CAP W contrast 05/31/19  IMPRESSION: 1. Pulmonary emboli with a saddle embolus. Critical Value/emergent results were called by telephone at the time of interpretation on 05/31/2019 at 12:03 pm to Cira Rue, NP , who verbally acknowledged these results. 2. Slight increase in perirectal soft tissue nodularity with overall worsening in pulmonary and hepatic metastatic disease. Omental nodularity appears slightly improved. 3. New mediastinal and periportal adenopathy. Aortocaval lymph node is not enlarged by CT size criteria but is new and therefore considered suspicious for metastatic involvement. 4. Aortic atherosclerosis (ICD10-170.0). Coronary artery calcification.   06/08/2019 - 09/04/2019 Chemotherapy   FOLFOXIRI q2weeks starting 06/08/19. Due to fatigue, diarrhea, nausea and low blood counts he was switched to FOLFIRI starting 06/22/19. Postponed the addition of Avastin until C4 on 08/03/19 given recent PE. Stopped 09/04/19 due to disease progression in liver.    08/30/2019 Imaging   CT CAP W Contrast 08/30/19  IMPRESSION: 1. Redemonstrated circumferential mass of the distal rectum (series 2, image 126). There has been interval removal of a previously seen rectal stent. 2. There are however newly enlarged right perirectal lymph nodes, measuring up to 6 mm (series 2, image 121). 3. Multiple bilateral pulmonary nodules are stable or slightly increased in size, the largest  index nodule in the right lower lobe measuring 1.2 x 1.2 cm (series 6, image 106), a nodule of the right middle lobe measuring 5 mm, previously 3 mm or smaller (series 6, image 89). 4. Interval enlargement of multiple, somewhat ill-defined low-attenuation liver lesions, an index lesion in the right lobe of the liver measuring 3.8 x 2.8 cm, previously 1.9 x 1.7 cm (series 2, image 47). 5. Interval enlargement of an aortocaval lymph node, measuring 1.3 x 1.1 cm, previously 1.1 x 0.8 cm (series 2, image 76). Interval enlargement in ill-defined portacaval lymph nodes, the largest measuring 2.4 x 1.3 cm, previously 1.5 x 0.9 cm (series 2, image 64). 6. Above constellation of findings is consistent with worsened metastatic disease. 7. Other chronic and incidental findings as detailed above. Aortic Atherosclerosis (ICD10-I70.0).   09/22/2019 -  Chemotherapy   Regorafinib (Stivarga) 57m daily 3 weeks on/1 week off starting 09/22/19.           -Restarted at 418mevery other day on 10/14/19.    10/13/2019 -  Chemotherapy   IV Keytruda q3weeks starting 10/13/19       REVIEW OF SYSTEMS:   Constitutional: Denies fevers, chills or abnormal weight loss Eyes: Denies blurriness of vision Ears, nose, mouth, throat, and face: Denies mucositis or sore throat Respiratory: Denies cough, dyspnea or wheezes Cardiovascular: Denies palpitation, chest discomfort Gastrointestinal:  Denies nausea, heartburn or change in bowel habits Skin: Denies abnormal skin rashes Lymphatics: Denies new lymphadenopathy or easy bruising Neurological:Denies numbness, tingling or new weaknesses Behavioral/Psych: Mood is stable, no new changes  Extremities: No lower extremity edema All other systems were reviewed with the patient and are negative.  I have reviewed the past medical history, past surgical history, social history and family history with the patient and they are unchanged from previous note.  PHYSICAL  EXAMINATION: ECOG PERFORMANCE STATUS: 3 - Symptomatic, >50% confined to bed  Vitals:   10/16/19 1353 10/16/19 2108  BP: 114/81 (!) 142/90  Pulse: 69 85  Resp: 16   Temp: 99.3 F (37.4 C) 98.3 F (36.8 C)  SpO2: 99% 96%   There were no vitals filed for this visit.  Intake/Output from previous day: 01/11 0701 - 01/12 0700 In: 1485.3 [P.O.:1180; IV Piggyback:305.3] Out: -   GENERAL:alert, no distress and comfortable SKIN: skin color, texture, turgor are normal, no rashes or significant lesions EYES: normal, Conjunctiva are pink and non-injected, sclera clear OROPHARYNX:no exudate, no erythema and lips, buccal mucosa, and tongue normal  NECK: supple, thyroid normal size, non-tender, without nodularity LYMPH:  no palpable lymphadenopathy in the cervical, axillary or inguinal LUNGS: clear to auscultation and percussion with normal breathing effort HEART: regular rate & rhythm and no murmurs and no lower extremity edema ABDOMEN:abdomen soft, non-tender and normal bowel sounds Musculoskeletal:no cyanosis of digits and no clubbing  NEURO: alert & oriented x 3 with fluent speech, no focal motor/sensory deficits  LABORATORY DATA:  I have reviewed the data as listed CMP Latest Ref Rng & Units 10/17/2019 10/16/2019 10/14/2019  Glucose 70 - 99 mg/dL 95 111(H) 104(H)  BUN 8 - 23 mg/dL 9 14 17   Creatinine 0.61 - 1.24 mg/dL 0.61 0.64 0.90  Sodium 135 - 145 mmol/L 135 137 133(L)  Potassium 3.5 - 5.1 mmol/L 3.9 3.6 3.6  Chloride 98 - 111 mmol/L 105 104 99  CO2 22 - 32 mmol/L 23 22 21(L)  Calcium 8.9 - 10.3 mg/dL 7.8(L) 7.9(L) 8.2(L)  Total Protein 6.5 - 8.1 g/dL 5.8(L) 6.3(L) 6.4(L)  Total Bilirubin 0.3 - 1.2 mg/dL 1.3(H) 1.1 3.1(H)  Alkaline Phos 38 - 126 U/L 1,300(H) 1,701(H) 1,932(H)  AST 15 - 41 U/L 69(H) 83(H) 264(H)  ALT 0 - 44 U/L 114(H) 165(H) 322(H)    Lab Results  Component Value Date   WBC 10.7 (H) 10/17/2019   HGB 8.0 (L) 10/17/2019   HCT 26.2 (L) 10/17/2019   MCV 98.1  10/17/2019   PLT 130 (L) 10/17/2019   NEUTROABS 8.4 (H) 10/17/2019    CT Abdomen Pelvis W Contrast  Result Date: 10/14/2019 CLINICAL DATA:  69 year old male with metastatic rectal cancer presents with fever and fatigue. Concern for intra-abdominal infection. EXAM: CT ABDOMEN AND PELVIS WITH CONTRAST TECHNIQUE: Multidetector CT imaging of the abdomen and pelvis was performed using the standard protocol following bolus administration of intravenous contrast. CONTRAST:  130m OMNIPAQUE IOHEXOL 300 MG/ML  SOLN COMPARISON:  CT abdomen pelvis dated 08/30/2019. FINDINGS: Lower chest: Multiple pulmonary lesions noted primarily involving the visualized right lung and measuring up to 19 mm in the right lower lobe consistent with metastatic disease. These have increased in size since the prior CT. There is coronary vascular calcification. No intra-abdominal free air or free fluid. Hepatobiliary: Multiple hypodense lesions within the liver measuring up to 6.8 x 4.7 cm (previously 3.8 x 2.7 cm) in the dome of the liver most consistent with metastatic disease. Significant interval increase in the size of the liver metastasis since the prior CT. There is mild periportal edema versus dilatation of the intrahepatic biliary tree. High attenuating content gallbladder may represent sludge or small stones versus vicarious excretion of contrast. A biliary stent is in place. Pancreas: There is a 2 x 3 cm low attenuating lesion in the uncinate process of the pancreas which has increased in size since the prior CT. This may represent  metastatic disease within the pancreas or within a peripancreatic lymph node. A primary pancreatic malignancy is not entirely excluded. No dilatation of the main pancreatic duct. No active inflammatory changes. Spleen: Normal in size without focal abnormality. Adrenals/Urinary Tract: The adrenal glands are unremarkable. There is no hydronephrosis on either side. There is symmetric enhancement and  excretion of contrast by both kidneys. Multiple bilateral renal cysts measure up to 3 cm in the interpolar aspect of the right kidney. The visualized ureters and urinary bladder appear unremarkable. Stomach/Bowel: There is circumferential thickening of the wall of the rectum measuring approximately 15 mm in thickness. There is haziness and stranding of the perirectal fat. Several small rounded nodular densities adjacent to the rectum measure up to 9 mm (series 2, image 91) and may represent mildly rounded lymph nodes. These findings are similar to prior CT. There is overall mild-to-moderate segmental narrowing and stricture of the rectal vault. There is large amount of stool in the distal colon proximal to this narrowed segment. No evidence of small-bowel obstruction. The appendix is normal. Vascular/Lymphatic: Mild aortoiliac atherosclerotic disease. No portal venous gas. Top-normal periportal lymph nodes measure up to 11 mm in short axis. Aortocaval hypodense lymph node measures 2.4 x 2.3 cm (coronal series 4, image 65) and increased since the prior CT (previously measuring 1.3 x 1.4 cm). Reproductive: The prostate and seminal vesicles are grossly unremarkable. Other: Small fat containing umbilical hernia. Musculoskeletal: No acute or significant osseous findings. IMPRESSION: 1. Interval progression of metastatic disease in the chest, abdomen and pelvis. 2. Circumferential thickening of the wall of the rectum with mild-to-moderate segmental narrowing and stricture of the rectal vault similar to prior CT. There is large amount of stool within the distal colon proximal to this narrowed segment. No evidence of small-bowel obstruction. Normal appendix. 3. Interval increase in the size of the low attenuating lesion in the uncinate process of the pancreas concerning for metastatic disease. A primary pancreatic malignancy is not entirely excluded. 4. Aortic Atherosclerosis (ICD10-I70.0). Electronically Signed   By: Anner Crete M.D.   On: 10/14/2019 22:27   US Abdomen Limited  Result Date: 10/02/2019 CLINICAL DATA:  Elevated liver enzymes.  History of rectal cancer. EXAM: ULTRASOUND ABDOMEN LIMITED RIGHT UPPER QUADRANT COMPARISON:  August 30, 2019. FINDINGS: Gallbladder: Gallbladder is mildly distended. No gallstones or gallbladder wall thickening or pericholecystic fluid is noted. No sonographic Murphy's sign is noted. Common bile duct: Diameter: 7 mm calculus is noted in distal common bile duct. Distal common bile duct measures 8 mm which is mildly dilated. Liver: At least 2 masses are noted in the liver consistent with metastatic disease; the largest measures 3 cm. Portal vein is patent on color Doppler imaging with normal direction of blood flow towards the liver. Other: None. IMPRESSION: 7 mm calculus is noted in distal common bile duct. Distal common bile duct is mildly dilated at 8 mm. MRCP or ERCP is recommended for further evaluation. At least 2 hepatic masses are noted suggesting metastatic disease. Electronically Signed   By: Marijo Conception M.D.   On: 10/02/2019 13:22   DG Chest Port 1 View  Result Date: 10/14/2019 CLINICAL DATA:  69 year old male with fatigue and fever. EXAM: PORTABLE CHEST 1 VIEW COMPARISON:  CT of the chest abdomen pelvis dated 08/30/2019. FINDINGS: Right pectoral Port-A-Cath with tip at the cavoatrial junction. There is mild eventration of the right hemidiaphragm. No focal consolidation, pleural effusion, or pneumothorax. Top-normal cardiac silhouette. No acute osseous pathology. IMPRESSION: No active  cardiopulmonary disease. Electronically Signed   By: Anner Crete M.D.   On: 10/14/2019 19:23   DG ERCP BILIARY & PANCREATIC DUCTS  Result Date: 10/05/2019 CLINICAL DATA:  ERCP.  Gallstones. EXAM: ERCP TECHNIQUE: Multiple spot images obtained with the fluoroscopic device and submitted for interpretation post-procedure. FLUOROSCOPY TIME:  Fluoroscopy Time:  7 minutes and 19 seconds  Number of Acquired Spot Images: 11 COMPARISON:  Ultrasound dated 10/02/2019 FINDINGS: Initial images demonstrate cannulation of the common bile duct with injection of contrast. There is intrahepatic and extrahepatic biliary ductal dilatation. Contrast is seen flowing into the gallbladder lumen subsequent images demonstrate a balloon sweep of the common bile duct with subsequent placement of a plastic biliary stent. IMPRESSION: ERCP as above. These images were submitted for radiologic interpretation only. Please see the procedural report for the amount of contrast and the fluoroscopy time utilized. Electronically Signed   By: Constance Holster M.D.   On: 10/05/2019 17:59    ASSESSMENT AND PLAN: 1.  Bacteremia secondary to Klebsiella pneumoniae 2.  Rectal cancer metastasized to the liver 3.  History of pulmonary emboli 4.  Anemia of chronic disease 5.  Abnormal LFTs secondary to liver metastasis and recent infection, improving 6.  Goals of care 7.  CODE STATUS: DNR/DNI  -The patient remains afebrile.  We will plan to continue IV antibiotics.  Sensitivities from prior blood cultures are pending.  We will repeat blood cultures later this evening.  Appreciate assistance from infectious disease.  We will also reach out to his gastroenterologist to notify them of the cholangitis following recent CBD stent placement to see if they have any additional recommendations. -He has been off treatment for his rectal cancer due to abnormal liver function.  Tinea to hold treatment. -We will continue Xarelto which has been dose was 10 mg daily secondary to history of recurrent GI bleed. -We will monitor LFTs closely. -The patient DNR/DNI.   LOS: 1 day   Mikey Bussing, DNP, AGPCNP-BC, AOCNP 10/17/19  Addendum  I have seen the patient. I agree with the assessment and and plan and have edited the notes.   He remains to be afebrile, LFT has improved, feels tired, pain is controlled. Culture showed sensitive to  most antibiotics, including oral cipro and bactrim. Will continue Cefepime now,repeat culture later today.  Truitt Merle  10/17/2019

## 2019-10-18 ENCOUNTER — Encounter (HOSPITAL_COMMUNITY): Payer: Self-pay | Admitting: Hematology

## 2019-10-18 ENCOUNTER — Telehealth: Payer: Self-pay

## 2019-10-18 DIAGNOSIS — B9689 Other specified bacterial agents as the cause of diseases classified elsewhere: Secondary | ICD-10-CM

## 2019-10-18 LAB — COMPREHENSIVE METABOLIC PANEL
ALT: 94 U/L — ABNORMAL HIGH (ref 0–44)
AST: 74 U/L — ABNORMAL HIGH (ref 15–41)
Albumin: 2 g/dL — ABNORMAL LOW (ref 3.5–5.0)
Alkaline Phosphatase: 1417 U/L — ABNORMAL HIGH (ref 38–126)
Anion gap: 12 (ref 5–15)
BUN: 11 mg/dL (ref 8–23)
CO2: 20 mmol/L — ABNORMAL LOW (ref 22–32)
Calcium: 8.2 mg/dL — ABNORMAL LOW (ref 8.9–10.3)
Chloride: 103 mmol/L (ref 98–111)
Creatinine, Ser: 0.69 mg/dL (ref 0.61–1.24)
GFR calc Af Amer: >60 mL/min (ref >60)
GFR calc non Af Amer: >60 mL/min (ref >60)
Glucose, Bld: 119 mg/dL — ABNORMAL HIGH (ref 70–99)
Potassium: 3.5 mmol/L (ref 3.5–5.1)
Sodium: 135 mmol/L (ref 135–145)
Total Bilirubin: 1.6 mg/dL — ABNORMAL HIGH (ref 0.3–1.2)
Total Protein: 6.3 g/dL — ABNORMAL LOW (ref 6.5–8.1)

## 2019-10-18 LAB — CBC WITH DIFFERENTIAL/PLATELET
Abs Immature Granulocytes: 0.1 10*3/uL — ABNORMAL HIGH (ref 0.00–0.07)
Basophils Absolute: 0 10*3/uL (ref 0.0–0.1)
Basophils Relative: 0 %
Eosinophils Absolute: 0.1 10*3/uL (ref 0.0–0.5)
Eosinophils Relative: 1 %
HCT: 27.9 % — ABNORMAL LOW (ref 39.0–52.0)
Hemoglobin: 8.5 g/dL — ABNORMAL LOW (ref 13.0–17.0)
Immature Granulocytes: 1 %
Lymphocytes Relative: 11 %
Lymphs Abs: 1.2 10*3/uL (ref 0.7–4.0)
MCH: 29.6 pg (ref 26.0–34.0)
MCHC: 30.5 g/dL (ref 30.0–36.0)
MCV: 97.2 fL (ref 80.0–100.0)
Monocytes Absolute: 0.8 10*3/uL (ref 0.1–1.0)
Monocytes Relative: 8 %
Neutro Abs: 8.1 10*3/uL — ABNORMAL HIGH (ref 1.7–7.7)
Neutrophils Relative %: 79 %
Platelets: 107 10*3/uL — ABNORMAL LOW (ref 150–400)
RBC: 2.87 MIL/uL — ABNORMAL LOW (ref 4.22–5.81)
RDW: 17.7 % — ABNORMAL HIGH (ref 11.5–15.5)
WBC: 10.3 10*3/uL (ref 4.0–10.5)
nRBC: 0 % (ref 0.0–0.2)

## 2019-10-18 LAB — CULTURE, BLOOD (ROUTINE X 2)
Special Requests: ADEQUATE
Special Requests: ADEQUATE

## 2019-10-18 LAB — GLUCOSE, CAPILLARY
Glucose-Capillary: 109 mg/dL — ABNORMAL HIGH (ref 70–99)
Glucose-Capillary: 94 mg/dL (ref 70–99)

## 2019-10-18 LAB — C DIFFICILE QUICK SCREEN W PCR REFLEX
C Diff antigen: NEGATIVE
C Diff interpretation: NOT DETECTED
C Diff toxin: NEGATIVE

## 2019-10-18 MED ORDER — SODIUM CHLORIDE 0.9 % IV SOLN
2.0000 g | INTRAVENOUS | Status: DC
  Administered 2019-10-18 – 2019-10-19 (×2): 2 g via INTRAVENOUS
  Filled 2019-10-18: qty 2
  Filled 2019-10-18: qty 20
  Filled 2019-10-18: qty 2

## 2019-10-18 NOTE — Progress Notes (Addendum)
INFECTIOUS DISEASE PROGRESS NOTE  ID: Billy Mendoza is a 69 y.o. male with  Active Problems:   Bacteremia due to Klebsiella pneumoniae  Subjective: No complaints. Non problems with port.   Abtx:  Anti-infectives (From admission, onward)   Start     Dose/Rate Route Frequency Ordered Stop   10/16/19 1600  ceFEPIme (MAXIPIME) 2 g in sodium chloride 0.9 % 100 mL IVPB     2 g 200 mL/hr over 30 Minutes Intravenous Every 8 hours 10/16/19 1518     10/16/19 1500  piperacillin-tazobactam (ZOSYN) IVPB 3.375 g  Status:  Discontinued     3.375 g 12.5 mL/hr over 240 Minutes Intravenous Every 8 hours 10/16/19 1404 10/16/19 1516      Medications:  Scheduled: . Chlorhexidine Gluconate Cloth  6 each Topical Daily  . docusate sodium  100 mg Oral BID  . feeding supplement (ENSURE ENLIVE)  237 mL Oral BID BM  . magnesium oxide  200 mg Oral BID  . morphine  30 mg Oral Q12H  . multivitamin with minerals  1 tablet Oral Daily  . potassium chloride SA  20 mEq Oral Daily  . rivaroxaban  10 mg Oral Daily    Objective: Vital signs in last 24 hours: Temp:  [98 F (36.7 C)-99.2 F (37.3 C)] 98.4 F (36.9 C) (01/13 0538) Pulse Rate:  [80-96] 96 (01/13 0538) Resp:  [16] 16 (01/12 2002) BP: (116-150)/(81-90) 116/81 (01/13 0538) SpO2:  [95 %-97 %] 96 % (01/13 0538) Weight:  [91.6 kg] 91.6 kg (01/12 1831)   General appearance: alert, cooperative and no distress Resp: clear to auscultation bilaterally Chest wall: port site clean, non-tender, no fluctuance.  Cardio: regular rate and rhythm GI: normal findings: bowel sounds normal and soft, non-tender  Lab Results Recent Labs    10/17/19 0508 10/18/19 0536  WBC 10.7* 10.3  HGB 8.0* 8.5*  HCT 26.2* 27.9*  NA 135 135  K 3.9 3.5  CL 105 103  CO2 23 20*  BUN 9 11  CREATININE 0.61 0.69   Liver Panel Recent Labs    10/17/19 0508 10/18/19 0536  PROT 5.8* 6.3*  ALBUMIN 1.9* 2.0*  AST 69* 74*  ALT 114* 94*  ALKPHOS 1,300* 1,417*    BILITOT 1.3* 1.6*   Sedimentation Rate No results for input(s): ESRSEDRATE in the last 72 hours. C-Reactive Protein No results for input(s): CRP in the last 72 hours.  Microbiology: Recent Results (from the past 240 hour(s))  Blood Culture (routine x 2)     Status: Abnormal   Collection Time: 10/14/19  6:46 PM   Specimen: BLOOD  Result Value Ref Range Status   Specimen Description   Final    BLOOD PORTA CATH Performed at [facility] [address] [place], [place] [address]    Special Requests   Final    BOTTLES DRAWN AEROBIC AND ANAEROBIC Blood Culture adequate volume Performed at [facility] [address] [place], [place] [address]    Culture  Setup Time   Final    GRAM NEGATIVE RODS IN BOTH AEROBIC AND ANAEROBIC BOTTLES CRITICAL RESULT CALLED TO, READ BACK BY AND VERIFIED WITH: C. REED, RN (MCHP) AT [id number] ON 10/15/19 BY C. JESSUP, MT. Performed at [facility], [place] [address], [place], [place] [address]    Culture KLEBSIELLA PNEUMONIAE CITROBACTER FREUNDII  (A)  Final   Report Status 10/18/2019 FINAL  Final   Organism ID, Bacteria KLEBSIELLA PNEUMONIAE  Final  Organism ID, Bacteria CITROBACTER FREUNDII  Final      Susceptibility   Citrobacter freundii - MIC*    CEFAZOLIN >=64 RESISTANT Resistant     CEFEPIME <=0.12 SENSITIVE Sensitive     CEFTAZIDIME <=1 SENSITIVE Sensitive     CEFTRIAXONE <=0.25 SENSITIVE Sensitive     CIPROFLOXACIN <=0.25 SENSITIVE Sensitive     GENTAMICIN <=1 SENSITIVE Sensitive     IMIPENEM 1 SENSITIVE Sensitive     TRIMETH/SULFA <=20 SENSITIVE Sensitive     PIP/TAZO <=4 SENSITIVE Sensitive     * CITROBACTER FREUNDII   Klebsiella pneumoniae - MIC*    AMPICILLIN >=32 RESISTANT Resistant     CEFAZOLIN <=4 SENSITIVE Sensitive     CEFEPIME <=0.12 SENSITIVE Sensitive     CEFTAZIDIME <=1 SENSITIVE Sensitive     CEFTRIAXONE <=0.25 SENSITIVE Sensitive     CIPROFLOXACIN <=0.25 SENSITIVE Sensitive      GENTAMICIN <=1 SENSITIVE Sensitive     IMIPENEM <=0.25 SENSITIVE Sensitive     TRIMETH/SULFA <=20 SENSITIVE Sensitive     AMPICILLIN/SULBACTAM 4 SENSITIVE Sensitive     PIP/TAZO <=4 SENSITIVE Sensitive     * KLEBSIELLA PNEUMONIAE  Blood Culture ID Panel (Reflexed)     Status: Abnormal   Collection Time: 10/14/19  6:46 PM  Result Value Ref Range Status   Enterococcus species NOT DETECTED NOT DETECTED Final   Listeria monocytogenes NOT DETECTED NOT DETECTED Final   Staphylococcus species NOT DETECTED NOT DETECTED Final   Staphylococcus aureus (BCID) NOT DETECTED NOT DETECTED Final   Streptococcus species NOT DETECTED NOT DETECTED Final   Streptococcus agalactiae NOT DETECTED NOT DETECTED Final   Streptococcus pneumoniae NOT DETECTED NOT DETECTED Final   Streptococcus pyogenes NOT DETECTED NOT DETECTED Final   Acinetobacter baumannii NOT DETECTED NOT DETECTED Final   Enterobacteriaceae species DETECTED (A) NOT DETECTED Final    Comment: Enterobacteriaceae represent a large family of gram-negative bacteria, not a single organism. CRITICAL RESULT CALLED TO, READ BACK BY AND VERIFIED WITH: C. REED, RN (MCHP) AT [id number] ON 10/15/19 BY C. JESSUP, MT.    Enterobacter cloacae complex NOT DETECTED NOT DETECTED Final   Escherichia coli NOT DETECTED NOT DETECTED Final   Klebsiella oxytoca NOT DETECTED NOT DETECTED Final   Klebsiella pneumoniae DETECTED (A) NOT DETECTED Final    Comment: CRITICAL RESULT CALLED TO, READ BACK BY AND VERIFIED WITH: C. REED, RN (MCHP) AT 1733 ON 10/15/19 BY C. JESSUP, MT.    Proteus species NOT DETECTED NOT DETECTED Final   Serratia marcescens NOT DETECTED NOT DETECTED Final   Carbapenem resistance NOT DETECTED NOT DETECTED Final   Haemophilus influenzae NOT DETECTED NOT DETECTED Final   Neisseria meningitidis NOT DETECTED NOT DETECTED Final   Pseudomonas aeruginosa NOT DETECTED NOT DETECTED Final   Candida albicans NOT DETECTED NOT DETECTED Final   Candida glabrata  NOT DETECTED NOT DETECTED Final   Candida krusei NOT DETECTED NOT DETECTED Final   Candida parapsilosis NOT DETECTED NOT DETECTED Final   Candida tropicalis NOT DETECTED NOT DETECTED Final    Comment: Performed at [facility], [place] [address] [place], [place] [address]  Respiratory Panel by RT PCR (Flu A&B, Covid) - Nasopharyngeal Swab     Status: None   Collection Time: 10/14/19  6:48 PM   Specimen: Nasopharyngeal Swab  Result Value Ref Range Status   SARS Coronavirus 2 by RT PCR NEGATIVE NEGATIVE Final    Comment: (NOTE) SARS-CoV-2 target nucleic acids are NOT DETECTED. The SARS-CoV-2 RNA is generally  detectable in upper respiratoy specimens during the acute phase of infection. The lowest concentration of SARS-CoV-2 viral copies this assay can detect is 131 copies/mL. A negative result does not preclude SARS-Cov-2 infection and should not be used as the sole basis for treatment or other patient management decisions. A negative result may occur with  improper specimen collection/handling, submission of specimen other than nasopharyngeal swab, presence of viral mutation(s) within the areas targeted by this assay, and inadequate number of viral copies (<131 copies/mL). A negative result must be combined with clinical observations, patient history, and epidemiological information. The expected result is Negative. Fact Sheet for Patients:  PinkCheek.be Fact Sheet for Healthcare Providers:  GravelBags.it This test is not yet ap proved or cleared by the [place] FDA and  has been authorized for detection and/or diagnosis of SARS-CoV-2 by FDA under an Emergency Use Authorization (EUA). This EUA will remain  in effect (meaning this test can be used) for the duration of the COVID-19 declaration under Section 564(b)(1) of the Act, 21 U.S.C. section 360bbb-3(b)(1), unless the authorization is terminated or revoked sooner.      Influenza A by PCR NEGATIVE NEGATIVE Final   Influenza B by PCR NEGATIVE NEGATIVE Final    Comment: (NOTE) The Xpert Xpress SARS-CoV-2/FLU/RSV assay is intended as an aid in  the diagnosis of influenza from Nasopharyngeal swab specimens and  should not be used as a sole basis for treatment. Nasal washings and  aspirates are unacceptable for Xpert Xpress SARS-CoV-2/FLU/RSV  testing. Fact Sheet for Patients: PinkCheek.be Fact Sheet for Healthcare Providers: GravelBags.it This test is not yet approved or cleared by the [place] FDA and  has been authorized for detection and/or diagnosis of SARS-CoV-2 by  FDA under an Emergency Use Authorization (EUA). This EUA will remain  in effect (meaning this test can be used) for the duration of the  Covid-19 declaration under Section 564(b)(1) of the Act, 21  U.S.C. section 360bbb-3(b)(1), unless the authorization is  terminated or revoked. Performed at [facility], [place] [address] [place], [place] [address]   Blood Culture (routine x 2)     Status: Abnormal   Collection Time: 10/14/19  6:51 PM   Specimen: BLOOD LEFT HAND  Result Value Ref Range Status   Specimen Description   Final    BLOOD LEFT HAND Performed at [facility] [address], [place], [place] [address]    Special Requests   Final    BOTTLES DRAWN AEROBIC AND ANAEROBIC Blood Culture adequate volume Performed at [facility] [address] [place], [place] [address]    Culture  Setup Time   Final    GRAM NEGATIVE RODS IN BOTH AEROBIC AND ANAEROBIC BOTTLES CRITICAL RESULT CALLED TO, READ BACK BY AND VERIFIED WITH: C. REED, RN (MCHP) AT [id number] ON 10/15/19 BY C. JESSUP, MT.    Culture (A)  Final    KLEBSIELLA PNEUMONIAE CITROBACTER FREUNDII SUSCEPTIBILITIES PERFORMED ON PREVIOUS CULTURE WITHIN THE LAST 5 DAYS. Performed at [facility], [place]  [address], [place], [place] [address]    Report Status 10/18/2019 FINAL  Final  Urine culture     Status: None   Collection Time: 10/15/19  1:06 AM   Specimen: In/Out Cath Urine  Result Value Ref Range Status   Specimen Description   Final    IN/OUT CATH URINE Performed at [facility] [address], [place], [place] [address]    Special Requests  Final    NONE Performed at [facility], [place] [address], [place], [place] [address]    Culture   Final    NO GROWTH Performed at [facility], [place] [address] [place] [address] [place], [place] [address]    Report Status 10/16/2019 FINAL  Final  Culture, Blood     Status: None (Preliminary result)   Collection Time: 10/16/19 12:50 PM   Specimen: BLOOD  Result Value Ref Range Status   Specimen Description BLOOD LEFT ANTECUBITAL  Final   Special Requests   Final    BOTTLES DRAWN AEROBIC AND ANAEROBIC Blood Culture adequate volume   Culture   Final    NO GROWTH 2 DAYS Performed at [facility], [place] [address] [place], [place] [address]    Report Status PENDING  Incomplete  Culture, Blood     Status: None (Preliminary result)   Collection Time: 10/16/19 12:55 PM   Specimen: BLOOD  Result Value Ref Range Status   Specimen Description BLOOD PORTA CATH  Final   Special Requests   Final    BOTTLES DRAWN AEROBIC AND ANAEROBIC Blood Culture adequate volume   Culture   Final    NO GROWTH 2 DAYS Performed at [facility], [address] [place], [place] [address]    Report Status PENDING  Incomplete  Culture, blood (routine x 2)     Status: None (Preliminary result)   Collection Time: 10/17/19  7:31 PM   Specimen: BLOOD  Result Value Ref Range Status   Specimen Description   Final    BLOOD RIGHT ANTECUBITAL Performed at [facility] [address] [place], [place] [address]    Special Requests   Final    BOTTLES DRAWN AEROBIC AND ANAEROBIC Blood Culture adequate  volume Performed at [facility] [address], [place], [place] [address]    Culture   Final    NO GROWTH < 12 HOURS Performed at [facility] [address] [place], [place] [address]    Report Status PENDING  Incomplete  Culture, blood (routine x 2)     Status: None (Preliminary result)   Collection Time: 10/17/19  7:33 PM   Specimen: BLOOD  Result Value Ref Range Status   Specimen Description   Final    BLOOD LEFT ANTECUBITAL Performed at [facility] [address] [place], [place] [address]    Special Requests   Final    BOTTLES DRAWN AEROBIC AND ANAEROBIC Blood Culture adequate volume Performed at [facility] [address], [place], [place] [address]    Culture   Final    NO GROWTH < 12 HOURS Performed at [facility] [address] [place], [place] [address]    Report Status PENDING  Incomplete    Studies/Results: No results found.   Assessment/Plan: Polymicrobial bacteremia (Klebsiella, Citrobacter) Stage IVb rectal cancer (2019) Port in place Hepatitis, obstructive  Total days of antibiotics: 3 cefepime  Discussed with Dr Annamaria Boots- will change to ceftriaxone Plan for 14 days total (end date 10-29-19) If unable to arrange IV, can go home with levaquin 750mg  daily for 10 days.  Would prefer IV due to his port.  Please repeat his BCx 1 week after completing therapy.  Suspect GI source.  Available as needed.          Bobby Rumpf MD, FACP Infectious Diseases (pager) [phone] [email or web address] 10/18/2019, 11:17 AM  LOS:  2 days

## 2019-10-18 NOTE — Progress Notes (Signed)
Billy Mendoza   DOB:[birth year]   [id number]   [id number]  Oncology follow up note  Subjective: Patient's wife was very concerned that he was sluggish, confused yesterday, which could be related to took the night before.  Due to the insomnia, he did request another dose of Ambien last night, was little drowsy when I saw him around noon, oriented, but responded to questions slowly. He states his pain is well controlled, no nausea, afebrile, was able to go to bathroom by himself, slept well last night, denies confusion or hallucination.   Objective:  Vitals:   10/18/19 1146 10/18/19 1148  BP: 110/83 110/83  Pulse: (!) 133 (!) 133  Resp:    Temp:    SpO2: 98% 99%    Body mass index is 27.4 kg/m.  Intake/Output Summary (Last 24 hours) at 10/18/2019 1309 Last data filed at 10/17/2019 1425 Gross per 24 hour  Intake 200 ml  Output -  Net 200 ml     Sclerae slightly icteric  Oropharynx clear  No peripheral adenopathy  Lungs clear -- no rales or rhonchi  Heart regular rate and rhythm  Abdomen benign  MSK no focal spinal tenderness, no peripheral edema  Neuro nonfocal, drowsy    CBG (last 3)  Recent Labs    10/17/19 0808 10/18/19 0625 10/18/19 0745  GLUCAP 89 109* 94     Labs:  Urine Studies No results for input(s): UHGB, CRYS in the last 72 hours.  Invalid input(s): UACOL, UAPR, USPG, UPH, UTP, UGL, UKET, UBIL, UNIT, UROB, Fel[place], UEPI, UWBC, Duwayne Heck [place], [place]  Basic Metabolic Panel: Recent Labs  Lab 10/13/19 1258 10/14/19 1846 10/16/19 1250 10/17/19 0508 10/18/19 0536  NA 135 133* 137 135 135  K 4.2 3.6 3.6 3.9 3.5  CL 101 99 104 105 103  CO2 21* 21* 22 23 20*  GLUCOSE 128* 104* 111* 95 119*  BUN 13 17 14 9 11   CREATININE 0.87 0.90 0.64 0.61 0.69  CALCIUM 8.9 8.2* 7.9* 7.8* 8.2*  MG  --   --   --  2.0  --    GFR Estimated Creatinine Clearance: 99.7 mL/min (by C-G formula based on SCr of 0.69 mg/dL). Liver Function Tests: Recent Labs  Lab  10/13/19 1258 10/14/19 1846 10/16/19 1250 10/17/19 0508 10/18/19 0536  AST 1,483* 264* 83* 69* 74*  ALT 616* 322* 165* 114* 94*  ALKPHOS 3,619* 1,932* 1,701* 1,300* 1,417*  BILITOT 2.3* 3.1* 1.1 1.3* 1.6*  PROT 7.1 6.4* 6.3* 5.8* 6.3*  ALBUMIN 2.3* 2.2* 1.9* 1.9* 2.0*   No results for input(s): LIPASE, AMYLASE in the last 168 hours. No results for input(s): AMMONIA in the last 168 hours. Coagulation profile Recent Labs  Lab 10/14/19 1846 10/17/19 0508  INR 2.2* 1.8*    CBC: Recent Labs  Lab 10/13/19 1258 10/14/19 1846 10/16/19 1250 10/17/19 0508 10/18/19 0536  WBC 16.1* 14.9* 10.1 10.7* 10.3  NEUTROABS 14.3* 14.2* 8.2* 8.4* 8.1*  HGB 10.2* 9.0* 9.2* 8.0* 8.5*  HCT 32.9* 29.6* 29.1* 26.2* 27.9*  MCV 94.8 97.7 94.8 98.1 97.2  PLT 201 153 121* 130* 107*   Cardiac Enzymes: No results for input(s): CKTOTAL, CKMB, CKMBINDEX, TROPONINI in the last 168 hours. BNP: Invalid input(s): POCBNP CBG: Recent Labs  Lab 10/17/19 0808 10/18/19 0625 10/18/19 0745  GLUCAP 89 109* 94   D-Dimer No results for input(s): DDIMER in the last 72 hours. Hgb A1c No results for input(s): HGBA1C in the last 72 hours. Lipid Profile No  results for input(s): CHOL, HDL, LDLCALC, TRIG, CHOLHDL, LDLDIRECT in the last 72 hours. Thyroid function studies No results for input(s): TSH, T4TOTAL, T3FREE, THYROIDAB in the last 72 hours.  Invalid input(s): FREET3 Anemia work up No results for input(s): VITAMINB12, FOLATE, FERRITIN, TIBC, IRON, RETICCTPCT in the last 72 hours. Microbiology Recent Results (from the past 240 hour(s))  Blood Culture (routine x 2)     Status: Abnormal   Collection Time: 10/14/19  6:46 PM   Specimen: BLOOD  Result Value Ref Range Status   Specimen Description   Final    BLOOD PORTA CATH Performed at [facility] [address], [place], [place] [address]    Special Requests   Final    BOTTLES DRAWN AEROBIC AND ANAEROBIC Blood Culture adequate  volume Performed at [facility] [address] [place], [place] [address]    Culture  Setup Time   Final    GRAM NEGATIVE RODS IN BOTH AEROBIC AND ANAEROBIC BOTTLES CRITICAL RESULT CALLED TO, READ BACK BY AND VERIFIED WITH: C. REED, RN (MCHP) AT [id number] ON 10/15/19 BY C. JESSUP, MT. Performed at [facility], [place] [address] [place], [place] [address]    Culture KLEBSIELLA PNEUMONIAE CITROBACTER FREUNDII  (A)  Final   Report Status 10/18/2019 FINAL  Final   Organism ID, Bacteria KLEBSIELLA PNEUMONIAE  Final   Organism ID, Bacteria CITROBACTER FREUNDII  Final      Susceptibility   Citrobacter freundii - MIC*    CEFAZOLIN >=64 RESISTANT Resistant     CEFEPIME <=0.12 SENSITIVE Sensitive     CEFTAZIDIME <=1 SENSITIVE Sensitive     CEFTRIAXONE <=0.25 SENSITIVE Sensitive     CIPROFLOXACIN <=0.25 SENSITIVE Sensitive     GENTAMICIN <=1 SENSITIVE Sensitive     IMIPENEM 1 SENSITIVE Sensitive     TRIMETH/SULFA <=20 SENSITIVE Sensitive     PIP/TAZO <=4 SENSITIVE Sensitive     * CITROBACTER FREUNDII   Klebsiella pneumoniae - MIC*    AMPICILLIN >=32 RESISTANT Resistant     CEFAZOLIN <=4 SENSITIVE Sensitive     CEFEPIME <=0.12 SENSITIVE Sensitive     CEFTAZIDIME <=1 SENSITIVE Sensitive     CEFTRIAXONE <=0.25 SENSITIVE Sensitive     CIPROFLOXACIN <=0.25 SENSITIVE Sensitive     GENTAMICIN <=1 SENSITIVE Sensitive     IMIPENEM <=0.25 SENSITIVE Sensitive     TRIMETH/SULFA <=20 SENSITIVE Sensitive     AMPICILLIN/SULBACTAM 4 SENSITIVE Sensitive     PIP/TAZO <=4 SENSITIVE Sensitive     * KLEBSIELLA PNEUMONIAE  Blood Culture ID Panel (Reflexed)     Status: Abnormal   Collection Time: 10/14/19  6:46 PM  Result Value Ref Range Status   Enterococcus species NOT DETECTED NOT DETECTED Final   Listeria monocytogenes NOT DETECTED NOT DETECTED Final   Staphylococcus species NOT DETECTED NOT DETECTED Final   Staphylococcus aureus (BCID) NOT DETECTED NOT DETECTED Final    Streptococcus species NOT DETECTED NOT DETECTED Final   Streptococcus agalactiae NOT DETECTED NOT DETECTED Final   Streptococcus pneumoniae NOT DETECTED NOT DETECTED Final   Streptococcus pyogenes NOT DETECTED NOT DETECTED Final   Acinetobacter baumannii NOT DETECTED NOT DETECTED Final   Enterobacteriaceae species DETECTED (A) NOT DETECTED Final    Comment: Enterobacteriaceae represent a large family of gram-negative bacteria, not a single organism. CRITICAL RESULT CALLED TO, READ BACK BY AND VERIFIED WITH: C. REED, RN (MCHP) AT [id number] ON 10/15/19 BY C. JESSUP, MT.    Enterobacter cloacae complex NOT DETECTED NOT  DETECTED Final   Escherichia coli NOT DETECTED NOT DETECTED Final   Klebsiella oxytoca NOT DETECTED NOT DETECTED Final   Klebsiella pneumoniae DETECTED (A) NOT DETECTED Final    Comment: CRITICAL RESULT CALLED TO, READ BACK BY AND VERIFIED WITH: C. REED, RN (MCHP) AT [id number] ON 10/15/19 BY C. JESSUP, MT.    Proteus species NOT DETECTED NOT DETECTED Final   Serratia marcescens NOT DETECTED NOT DETECTED Final   Carbapenem resistance NOT DETECTED NOT DETECTED Final   Haemophilus influenzae NOT DETECTED NOT DETECTED Final   Neisseria meningitidis NOT DETECTED NOT DETECTED Final   Pseudomonas aeruginosa NOT DETECTED NOT DETECTED Final   Candida albicans NOT DETECTED NOT DETECTED Final   Candida glabrata NOT DETECTED NOT DETECTED Final   Candida krusei NOT DETECTED NOT DETECTED Final   Candida parapsilosis NOT DETECTED NOT DETECTED Final   Candida tropicalis NOT DETECTED NOT DETECTED Final    Comment: Performed at [facility], [place] [address], [place], [place] [address]  Respiratory Panel by RT PCR (Flu A&B, Covid) - Nasopharyngeal Swab     Status: None   Collection Time: 10/14/19  6:48 PM   Specimen: Nasopharyngeal Swab  Result Value Ref Range Status   SARS Coronavirus 2 by RT PCR NEGATIVE NEGATIVE Final    Comment: (NOTE) SARS-CoV-2 target nucleic acids are NOT DETECTED. The  SARS-CoV-2 RNA is generally detectable in upper respiratoy specimens during the acute phase of infection. The lowest concentration of SARS-CoV-2 viral copies this assay can detect is 131 copies/mL. A negative result does not preclude SARS-Cov-2 infection and should not be used as the sole basis for treatment or other patient management decisions. A negative result may occur with  improper specimen collection/handling, submission of specimen other than nasopharyngeal swab, presence of viral mutation(s) within the areas targeted by this assay, and inadequate number of viral copies (<131 copies/mL). A negative result must be combined with clinical observations, patient history, and epidemiological information. The expected result is Negative. Fact Sheet for Patients:  PinkCheek.be Fact Sheet for Healthcare Providers:  GravelBags.it This test is not yet ap proved or cleared by the [place] FDA and  has been authorized for detection and/or diagnosis of SARS-CoV-2 by FDA under an Emergency Use Authorization (EUA). This EUA will remain  in effect (meaning this test can be used) for the duration of the COVID-19 declaration under Section 564(b)(1) of the Act, 21 U.S.C. section 360bbb-3(b)(1), unless the authorization is terminated or revoked sooner.    Influenza A by PCR NEGATIVE NEGATIVE Final   Influenza B by PCR NEGATIVE NEGATIVE Final    Comment: (NOTE) The Xpert Xpress SARS-CoV-2/FLU/RSV assay is intended as an aid in  the diagnosis of influenza from Nasopharyngeal swab specimens and  should not be used as a sole basis for treatment. Nasal washings and  aspirates are unacceptable for Xpert Xpress SARS-CoV-2/FLU/RSV  testing. Fact Sheet for Patients: PinkCheek.be Fact Sheet for Healthcare Providers: GravelBags.it This test is not yet approved or cleared by the [place] FDA and  has been authorized for detection and/or diagnosis of SARS-CoV-2 by  FDA under an Emergency Use Authorization (EUA). This EUA will remain  in effect (meaning this test can be used) for the duration of the  Covid-19 declaration under Section 564(b)(1) of the Act, 21  U.S.C. section 360bbb-3(b)(1), unless the authorization is  terminated or revoked. Performed at [facility], [place] [address], [place], [place] [address]   Blood Culture (routine x 2)  Status: Abnormal   Collection Time: 10/14/19  6:51 PM   Specimen: BLOOD LEFT HAND  Result Value Ref Range Status   Specimen Description   Final    BLOOD LEFT HAND Performed at [facility], [place] [address], [place], [place] [address]    Special Requests   Final    BOTTLES DRAWN AEROBIC AND ANAEROBIC Blood Culture adequate volume Performed at [facility] [address] [place], [place] [address]    Culture  Setup Time   Final    GRAM NEGATIVE RODS IN BOTH AEROBIC AND ANAEROBIC BOTTLES CRITICAL RESULT CALLED TO, READ BACK BY AND VERIFIED WITH: C. REED, RN (MCHP) AT [id number] ON 10/15/19 BY C. JESSUP, MT.    Culture (A)  Final    KLEBSIELLA PNEUMONIAE CITROBACTER FREUNDII SUSCEPTIBILITIES PERFORMED ON PREVIOUS CULTURE WITHIN THE LAST 5 DAYS. Performed at [facility], [place] [address], [place], [place] [address]    Report Status 10/18/2019 FINAL  Final  Urine culture     Status: None   Collection Time: 10/15/19  1:06 AM   Specimen: In/Out Cath Urine  Result Value Ref Range Status   Specimen Description   Final    IN/OUT CATH URINE Performed at [facility] [address] [place], [place] [address]    Special Requests   Final    NONE Performed at [facility], [place] [address], [place], [place] [address]    Culture   Final    NO GROWTH Performed at [facility], [place] [address] [place], [place] [address]     Report Status 10/16/2019 FINAL  Final  Culture, Blood     Status: None (Preliminary result)   Collection Time: 10/16/19 12:50 PM   Specimen: BLOOD  Result Value Ref Range Status   Specimen Description BLOOD LEFT ANTECUBITAL  Final   Special Requests   Final    BOTTLES DRAWN AEROBIC AND ANAEROBIC Blood Culture adequate volume   Culture   Final    NO GROWTH 2 DAYS Performed at [facility], [place] [address], [place], [place] [address]    Report Status PENDING  Incomplete  Culture, Blood     Status: None (Preliminary result)   Collection Time: 10/16/19 12:55 PM   Specimen: BLOOD  Result Value Ref Range Status   Specimen Description BLOOD PORTA CATH  Final   Special Requests   Final    BOTTLES DRAWN AEROBIC AND ANAEROBIC Blood Culture adequate volume   Culture   Final    NO GROWTH 2 DAYS Performed at [facility], [address] [place], [place] [address]    Report Status PENDING  Incomplete  Culture, blood (routine x 2)     Status: None (Preliminary result)   Collection Time: 10/17/19  7:31 PM   Specimen: BLOOD  Result Value Ref Range Status   Specimen Description   Final    BLOOD RIGHT ANTECUBITAL Performed at [facility] [address] [place], [place] [address]    Special Requests   Final    BOTTLES DRAWN AEROBIC AND ANAEROBIC Blood Culture adequate volume Performed at [facility] [address], [place], [place] [address]    Culture   Final    NO GROWTH < 12 HOURS Performed at [facility] [address], [place], [place] [address]    Report Status PENDING  Incomplete  Culture, blood (routine x 2)  Status: None (Preliminary result)   Collection Time: 10/17/19  7:33 PM   Specimen: BLOOD  Result Value Ref Range Status   Specimen Description   Final    BLOOD LEFT ANTECUBITAL Performed at [facility] [address] [place], [place] [address]    Special Requests   Final    BOTTLES  DRAWN AEROBIC AND ANAEROBIC Blood Culture adequate volume Performed at [facility] [address], [place], [place] [address]    Culture   Final    NO GROWTH < 12 HOURS Performed at [facility] [address] [place], [place] [address]    Report Status PENDING  Incomplete      Studies:  No results found.  Assessment: 69 y.o.  1.  Bacteremia secondary to Klebsiella pneumoniae 2.  Rectal cancer metastasized to the liver 3.  History of pulmonary emboli 4.  Anemia of chronic disease 5.  Abnormal LFTs secondary to liver metastasis and recent infection, improving 6.    Mild encephalopathy, likely related to Ambien  7.  CODE STATUS: DNR/DNI  Plan:  -he is doing well overall, except the drowsiness from Ambien.  We will stop Ambien tonight. -His blood culture before hospital admission has been negative for 2 days, repeated blood culture yesterday still pending. -I discussed with Dr. Johnnye Sima, if his cultures remain to be negative by tomorrow morning, plan to discharge him home with home care, and will continue IV antibiotics (will change to ceftriaxone once daily) to complete a 14 days course -I spoke with his wife about above, and answered her questions -PT evaluation today  -I spoke with case manager about his home care needs  -anticipate d/c home tomorrow    Truitt Merle, MD 10/18/2019  1:09 PM

## 2019-10-18 NOTE — Evaluation (Signed)
Physical Therapy Evaluation Patient Details Name: Billy Mendoza MRN: [id number] DOB: 1950-12-03 Today's Date: 10/18/2019   History of Present Illness  69 year old male with a history of a metastatic rectal cancer with liver metastasis who is managed by Elson Clan. He was most recently seen in the emergency room at [facility] on 10/14/2019 after having had a fever on Saturday morning of 100.6.Admitted for Klebsiella bacteremia.    Clinical Impression  Billy Mendoza is 69 y.o. male admitted with above HPI and diagnosis. Patient is currently limited by functional impairments below (see PT problem list). Patient lives with his wife and requires assistance for showering and ambulated short distances in home with no device at baseline. Patient will benefit from continued skilled PT interventions to address impairments and progress independence with mobility, recommending HHPT and pt will benefit from RW for mobility at home. Acute PT will follow and progress as able.     Follow Up Recommendations Home health PT    Equipment Recommendations  Rolling walker with 5" wheels    Recommendations for Other Services       Precautions / Restrictions Precautions Precautions: Fall Restrictions Weight Bearing Restrictions: No      Mobility  Bed Mobility Overal bed mobility: Needs Assistance Bed Mobility: Supine to Sit;Sit to Supine     Supine to sit: HOB elevated;Supervision Sit to supine: HOB elevated   General bed mobility comments: pt slow to mobilize and assist required to raise trunk upright; assist to bring LE's up into bed to return to supine  Transfers Overall transfer level: Needs assistance Equipment used: None Transfers: Sit to/from Stand Sit to Stand: Min guard         General transfer comment: min guard for safety, no assist with power up at EOB  Ambulation/Gait Ambulation/Gait assistance: Min assist Gait Distance (Feet): 80 Feet Assistive device: IV  Pole Gait Pattern/deviations: Narrow base of support;Decreased stride length;Decreased step length - left;Decreased step length - right;Step-through pattern;Drifts right/left Gait velocity: decreased   General Gait Details: pt wtih slow gait, NBOS and unsteady requiring min assist to steady and prevent LOB. Pt became SOB and fatigued after ambulating. HR elevated to 141 bpm upon sitting down immediately and SpO2 at 98%. Pt reported "yes" when asked if he was having chest pain. RN notified and vitals assessed.  Stairs            Wheelchair Mobility    Modified Rankin (Stroke Patients Only)       Balance Overall balance assessment: Needs assistance Sitting-balance support: Feet supported Sitting balance-Leahy Scale: Fair     Standing balance support: During functional activity;Bilateral upper extremity supported Standing balance-Leahy Scale: Fair              Pertinent Vitals/Pain Pain Assessment: 0-10 Pain Score: 10-Worst pain ever Pain Location: chest, Lt upper region(pt reports chest pain after walking, appears confused stating "yes, no" as response to if he has pain) Pain Descriptors / Indicators: Sharp(veritable) Pain Intervention(s): Other (comment);Monitored during session(RN notified of chest pain and elevated HR)    Home Living Family/patient expects to be discharged to:: Private residence Living Arrangements: Spouse/significant other Available Help at Discharge: Family;Available 24 hours/day(daughter, and wifes mom can assist when wife is at work) Type of Home: House Home Access: Stairs to enter Entrance Stairs-Rails: Can reach both Entrance Stairs-Number of Steps: 2 at front door (no rails); 4 at garage (rails) then step up through door Home Layout: Two level;Able to live on main level with bedroom/bathroom  Home Equipment: Shower seat;Grab bars - tub/shower;Hand held shower head Additional Comments: currently remodeling bathroom on main floor, will have  walk in shower with built in bench, and will have grab bars,    Prior Function Level of Independence: Independent;Needs assistance   Gait / Transfers Assistance Needed: pt is typically independent with gait no AD, pt's wife reports he hasn't driven in 2.5 wks and she does not feel he will need to  ADL's / Homemaking Assistance Needed: pt wife has been helping him get in/out of shower and then bathes himself, pt's typically stays and supervises bathing for safety. pt's wife lays out clothes for him and he gets dresesd himself, she does all homemaking and has seats set around the house for him to take seated breaks when needed  Comments: pt is poor historian at time of eval and remains confused. pt's wife provided history     Hand Dominance   Dominant Hand: Left    Extremity/Trunk Assessment   Upper Extremity Assessment Upper Extremity Assessment: Generalized weakness    Lower Extremity Assessment Lower Extremity Assessment: Generalized weakness    Cervical / Trunk Assessment Cervical / Trunk Assessment: Normal  Communication   Communication: No difficulties  Cognition Arousal/Alertness: Awake/alert Behavior During Therapy: WFL for tasks assessed/performed Overall Cognitive Status: No family/caregiver present to determine baseline cognitive functioning Area of Impairment: Memory;Following commands;Problem solving        Memory: Decreased short-term memory Following Commands: Follows multi-step commands inconsistently;Follows one step commands consistently;Follows one step commands with increased time     Problem Solving: Slow processing;Requires verbal cues;Difficulty sequencing General Comments: called pt's wife and she reports he has no memory or cognitive deficits at baseline. she feels this is medication related and hopes this will improve.      General Comments      Exercises     Assessment/Plan    PT Assessment Patient needs continued PT services  PT Problem  List Decreased strength;Decreased activity tolerance;Decreased mobility;Decreased knowledge of use of DME       PT Treatment Interventions DME instruction;Gait training;Functional mobility training;Therapeutic activities;Therapeutic exercise;Balance training;Patient/family education    PT Goals (Current goals can be found in the Care Plan section)  Acute Rehab PT Goals Patient Stated Goal: to return home with wife PT Goal Formulation: With patient/family Time For Goal Achievement: 11/01/19 Potential to Achieve Goals: Good    Frequency Min 3X/week    AM-PAC PT "6 Clicks" Mobility  Outcome Measure Help needed turning from your back to your side while in a flat bed without using bedrails?: A Little Help needed moving from lying on your back to sitting on the side of a flat bed without using bedrails?: A Little Help needed moving to and from a bed to a chair (including a wheelchair)?: A Little Help needed standing up from a chair using your arms (e.g., wheelchair or bedside chair)?: A Little Help needed to walk in hospital room?: A Little Help needed climbing 3-5 steps with a railing? : A Little 6 Click Score: 18    End of Session Equipment Utilized During Treatment: Gait belt Activity Tolerance: Patient tolerated treatment well;Other (comment)(pt fatigued at EOS and c/o chest pain, RN notified) Patient left: in bed;with bed alarm set;with call bell/phone within reach   PT Visit Diagnosis: Unsteadiness on feet (R26.81);Difficulty in walking, not elsewhere classified (R26.2);Muscle weakness (generalized) (M62.81)    Time: [id number] PT Time Calculation (min) (ACUTE ONLY): 23 min   Charges:   PT Evaluation $PT Eval  Low Complexity: 1 Low PT Treatments $Gait Training: 8-22 mins       Verner Mould, DPT Physical Therapist with [facility] [phone]  10/18/2019 1:20 PM

## 2019-10-18 NOTE — Progress Notes (Signed)
   Vital Signs MEWS/VS Documentation      10/17/2019 2002 10/18/2019 0538 10/18/2019 1146 10/18/2019 1148   MEWS Score:  0  0  3  3   MEWS Score Color:  Green  Green  Yellow  Yellow   Resp:  16  --  --  --   Pulse:  91  96  (!) 133  (!) 133   BP:  (!) 150/90  116/81  110/83  110/83   Temp:  99.2 F (37.3 C)  98.4 F (36.9 C)  --  --   O2 Device:  Room Air  Room [employer]  --     Patient triggering yellow mews for pulse rate of 133. Patient just finished walking with PT. All other vitals are normal. Yellow mews implemented. Called Dr. Burr Medico, no new orders. Will continue to monitor patient.       Jahmeer Porche A Candace Ramus 10/18/2019,11:56 AM

## 2019-10-18 NOTE — Telephone Encounter (Signed)
Billy Mendoza wife Billy Mendoza called requesting Dr. Burr Medico call her for an update on her husband who is currently an inpatient at [facility].  She has questions regarding his labs and Lorrin Mais that was prescribed.

## 2019-10-18 NOTE — TOC Initial Note (Addendum)
Transition of Care [facility]) - Initial/Assessment Note    Patient Details  Name: Billy Mendoza MRN: [id number] Date of Birth: [birth year]  Transition of Care [facility]) CM/SW Contact:    Lynnell Catalan, RN Phone Number: 10/18/2019, 3:26 PM  Clinical Narrative:                 This CM was notified by Oncology MD that pt would need IV abx at home at discharge. This CM notified Pam from [facility] that home IV abx would be needed. Pam to contact pt wife to coordinate dc. [facility] to do HHPT.  TOC will continue to follow along and assist as needed.  Expected Discharge Plan: [place] Barriers to Discharge: Continued Medical Work up  Expected Discharge Plan and Services Expected Discharge Plan: [place]                                 Activities of Daily Living Home Assistive Devices/Equipment: None ADL Screening (condition at time of admission) Patient's cognitive ability adequate to safely complete daily activities?: No Is the patient deaf or have difficulty hearing?: No Does the patient have difficulty seeing, even when wearing glasses/contacts?: No Does the patient have difficulty concentrating, remembering, or making decisions?: No Patient able to express need for assistance with ADLs?: Yes Does the patient have difficulty dressing or bathing?: Yes(gets tiring) Independently performs ADLs?: No Communication: Independent Dressing (OT): Needs assistance Is this a change from baseline?: Change from baseline, expected to last >3 days Grooming: Needs assistance Is this a change from baseline?: Pre-admission baseline Feeding: Independent Bathing: Needs assistance Is this a change from baseline?: Pre-admission baseline Toileting: Independent In/Out Bed: Independent Walks in Home: Independent Does the patient have difficulty walking or climbing stairs?: Yes Weakness of Legs: Both Weakness of Arms/Hands: Both  Permission Sought/Granted                   Emotional Assessment              Admission diagnosis:  Bacteremia due to Klebsiella pneumoniae [R78.81, B96.1] Patient Active Problem List   Diagnosis Date Noted  . Bacteremia due to Klebsiella pneumoniae 10/16/2019  . Pulmonary embolism ([place]) 06/01/2019  . Acute pulmonary embolism ([place]) 05/31/2019  . QT prolongation 05/31/2019  . Hypokalemia 01/18/2019  . PE (pulmonary thromboembolism) ([place]) 01/18/2019  . Large bowel obstruction ([place])   . Port-A-Cath in place 08/18/2018  . Iron deficiency anemia due to chronic blood loss 08/18/2018  . Rectal cancer metastasized to liver ([place]) 08/04/2018  . Goals of care, counseling/discussion 08/04/2018   PCP:  Merry Proud, MD Pharmacy:   [facility] [place], [address] [place] [address] Phone: [phone] Fax: [phone]  [place], [place] - [address] [place] [address] Phone: [phone] Fax: [phone]     Social Determinants of Health (SDOH) Interventions    Readmission Risk Interventions Readmission Risk Prevention Plan 10/18/2019  Transportation Screening Complete  Medication Review (RN Care Manager) Complete  PCP or Specialist appointment within 3-5 days of discharge Complete  HRI or [place] Not Applicable

## 2019-10-19 ENCOUNTER — Inpatient Hospital Stay (HOSPITAL_COMMUNITY): Payer: BC Managed Care – PPO

## 2019-10-19 ENCOUNTER — Encounter (HOSPITAL_COMMUNITY): Payer: Self-pay | Admitting: Hematology

## 2019-10-19 DIAGNOSIS — I69328 Other speech and language deficits following cerebral infarction: Secondary | ICD-10-CM

## 2019-10-19 DIAGNOSIS — I639 Cerebral infarction, unspecified: Secondary | ICD-10-CM

## 2019-10-19 LAB — COMPREHENSIVE METABOLIC PANEL
ALT: 69 U/L — ABNORMAL HIGH (ref 0–44)
AST: 67 U/L — ABNORMAL HIGH (ref 15–41)
Albumin: 1.8 g/dL — ABNORMAL LOW (ref 3.5–5.0)
Alkaline Phosphatase: 1178 U/L — ABNORMAL HIGH (ref 38–126)
Anion gap: 8 (ref 5–15)
BUN: 14 mg/dL (ref 8–23)
CO2: 22 mmol/L (ref 22–32)
Calcium: 7.7 mg/dL — ABNORMAL LOW (ref 8.9–10.3)
Chloride: 106 mmol/L (ref 98–111)
Creatinine, Ser: 0.58 mg/dL — ABNORMAL LOW (ref 0.61–1.24)
GFR calc Af Amer: >60 mL/min (ref >60)
GFR calc non Af Amer: >60 mL/min (ref >60)
Glucose, Bld: 98 mg/dL (ref 70–99)
Potassium: 3.6 mmol/L (ref 3.5–5.1)
Sodium: 136 mmol/L (ref 135–145)
Total Bilirubin: 1 mg/dL (ref 0.3–1.2)
Total Protein: 5.6 g/dL — ABNORMAL LOW (ref 6.5–8.1)

## 2019-10-19 LAB — CBC WITH DIFFERENTIAL/PLATELET
Abs Immature Granulocytes: 0.11 10*3/uL — ABNORMAL HIGH (ref 0.00–0.07)
Basophils Absolute: 0 10*3/uL (ref 0.0–0.1)
Basophils Relative: 0 %
Eosinophils Absolute: 0.2 10*3/uL (ref 0.0–0.5)
Eosinophils Relative: 2 %
HCT: 25.2 % — ABNORMAL LOW (ref 39.0–52.0)
Hemoglobin: 7.5 g/dL — ABNORMAL LOW (ref 13.0–17.0)
Immature Granulocytes: 1 %
Lymphocytes Relative: 9 %
Lymphs Abs: 1 10*3/uL (ref 0.7–4.0)
MCH: 29.2 pg (ref 26.0–34.0)
MCHC: 29.8 g/dL — ABNORMAL LOW (ref 30.0–36.0)
MCV: 98.1 fL (ref 80.0–100.0)
Monocytes Absolute: 1 10*3/uL (ref 0.1–1.0)
Monocytes Relative: 9 %
Neutro Abs: 8.7 10*3/uL — ABNORMAL HIGH (ref 1.7–7.7)
Neutrophils Relative %: 79 %
Platelets: 102 10*3/uL — ABNORMAL LOW (ref 150–400)
RBC: 2.57 MIL/uL — ABNORMAL LOW (ref 4.22–5.81)
RDW: 17.8 % — ABNORMAL HIGH (ref 11.5–15.5)
WBC: 11 10*3/uL — ABNORMAL HIGH (ref 4.0–10.5)
nRBC: 0 % (ref 0.0–0.2)

## 2019-10-19 LAB — GLUCOSE, CAPILLARY: Glucose-Capillary: 97 mg/dL (ref 70–99)

## 2019-10-19 LAB — PREPARE RBC (CROSSMATCH)

## 2019-10-19 MED ORDER — STROKE: EARLY STAGES OF RECOVERY BOOK
Freq: Once | Status: AC
  Filled 2019-10-19 (×2): qty 1

## 2019-10-19 MED ORDER — IOHEXOL 300 MG/ML  SOLN
75.0000 mL | Freq: Once | INTRAMUSCULAR | Status: AC | PRN
  Administered 2019-10-19: 75 mL via INTRAVENOUS

## 2019-10-19 MED ORDER — LOPERAMIDE HCL 2 MG PO CAPS
2.0000 mg | ORAL_CAPSULE | ORAL | Status: DC | PRN
  Administered 2019-10-19: 18:00:00 4 mg via ORAL
  Filled 2019-10-19: qty 2

## 2019-10-19 MED ORDER — CEFTRIAXONE IV (FOR PTA / DISCHARGE USE ONLY): 2.0000 g | INTRAVENOUS | 0 refills | Status: AC

## 2019-10-19 MED ORDER — SODIUM CHLORIDE 0.9% IV SOLUTION: Freq: Once | INTRAVENOUS | Status: AC

## 2019-10-19 MED ORDER — SODIUM CHLORIDE (PF) 0.9 % IJ SOLN
INTRAMUSCULAR | Status: AC
  Filled 2019-10-19: qty 50

## 2019-10-19 NOTE — Progress Notes (Signed)
PHARMACY CONSULT NOTE FOR:  OUTPATIENT  PARENTERAL ANTIBIOTIC THERAPY (OPAT)  Indication: gram negative bacteremia Regimen: ceftriaxone 2gm IV q24h End date: 10/29/2019  IV antibiotic discharge orders are pended. To discharging provider:  please sign these orders via discharge navigator,  Select New Orders & click on the button choice - Manage This Unsigned Work.     Thank you for allowing pharmacy to be a part of this patient's care.  Doreene Eland, PharmD, BCPS.   Work Cell: [phone] 10/19/2019 9:14 AM

## 2019-10-19 NOTE — Progress Notes (Addendum)
Billy Mendoza   DOB:1951/05/03   [id number]   [id number]  Oncology follow up note  Subjective: When seen this morning, the patient was more sluggish and mildly confused.  He had some difficulty finding his words.  He has not been having any fevers.  Repeat blood cultures are negative to date.  Denies chest discomfort.  Reported abdominal discomfort this morning.  He had a very liquid, maroon-colored stool at the time my visit.  The patient has expressed that he wants to be discharged home today.  Objective:  Vitals:   10/19/19 1433 10/19/19 1520  BP: 117/80 120/79  Pulse: 81 83  Resp: 16 15  Temp: 97.6 F (36.4 C) 97.6 F (36.4 C)  SpO2: 97% 95%    Body mass index is 27.4 kg/m.  Intake/Output Summary (Last 24 hours) at 10/19/2019 1601 Last data filed at 10/19/2019 0900 Gross per 24 hour  Intake 100 ml  Output 1 ml  Net 99 ml     Sclerae slightly icteric  Oropharynx clear  No peripheral adenopathy  Lungs clear -- no rales or rhonchi  Heart regular rate and rhythm  Abdomen benign  MSK no focal spinal tenderness, no peripheral edema  Neuro nonfocal, drowsy    CBG (last 3)  Recent Labs    10/18/19 0625 10/18/19 0745 10/19/19 0741  GLUCAP 109* 94 97     Labs:  Urine Studies No results for input(s): UHGB, CRYS in the last 72 hours.  Invalid input(s): UACOL, UAPR, USPG, UPH, UTP, UGL, [place], UBIL, UNIT, UROB, [place], UEPI, UWBC, Junie Panning [place], [place], [place]  Basic Metabolic Panel: Recent Labs  Lab 10/14/19 1846 10/14/19 1846 10/16/19 1250 10/16/19 1250 10/17/19 0508 10/17/19 0508 10/18/19 0536 10/19/19 0444  NA 133*  --  137  --  135  --  135 136  K 3.6   < > 3.6   < > 3.9   < > 3.5 3.6  CL 99  --  104  --  105  --  103 106  CO2 21*  --  22  --  23  --  20* 22  GLUCOSE 104*  --  111*  --  95  --  119* 98  BUN 17  --  14  --  9  --  11 14  CREATININE 0.90  --  0.64  --  0.61  --  0.69 0.58*  CALCIUM 8.2*  --  7.9*  --  7.8*  --  8.2* 7.7*  MG  --    --   --   --  2.0  --   --   --    < > = values in this interval not displayed.   GFR Estimated Creatinine Clearance: 99.7 mL/min (A) (by C-G formula based on SCr of 0.58 mg/dL (L)). Liver Function Tests: Recent Labs  Lab 10/14/19 1846 10/16/19 1250 10/17/19 0508 10/18/19 0536 10/19/19 0444  AST 264* 83* 69* 74* 67*  ALT 322* 165* 114* 94* 69*  ALKPHOS 1,932* 1,701* 1,300* 1,417* 1,178*  BILITOT 3.1* 1.1 1.3* 1.6* 1.0  PROT 6.4* 6.3* 5.8* 6.3* 5.6*  ALBUMIN 2.2* 1.9* 1.9* 2.0* 1.8*   No results for input(s): LIPASE, AMYLASE in the last 168 hours. No results for input(s): AMMONIA in the last 168 hours. Coagulation profile Recent Labs  Lab 10/14/19 1846 10/17/19 0508  INR 2.2* 1.8*    CBC: Recent Labs  Lab 10/14/19 1846 10/16/19 1250 10/17/19 0508 10/18/19 0536 10/19/19 0444  WBC 14.9*  10.1 10.7* 10.3 11.0*  NEUTROABS 14.2* 8.2* 8.4* 8.1* 8.7*  HGB 9.0* 9.2* 8.0* 8.5* 7.5*  HCT 29.6* 29.1* 26.2* 27.9* 25.2*  MCV 97.7 94.8 98.1 97.2 98.1  PLT 153 121* 130* 107* 102*   Cardiac Enzymes: No results for input(s): CKTOTAL, CKMB, CKMBINDEX, TROPONINI in the last 168 hours. BNP: Invalid input(s): POCBNP CBG: Recent Labs  Lab 10/17/19 0808 10/18/19 0625 10/18/19 0745 10/19/19 0741  GLUCAP 89 109* 94 97   D-Dimer No results for input(s): DDIMER in the last 72 hours. Hgb A1c No results for input(s): HGBA1C in the last 72 hours. Lipid Profile No results for input(s): CHOL, HDL, LDLCALC, TRIG, CHOLHDL, LDLDIRECT in the last 72 hours. Thyroid function studies No results for input(s): TSH, T4TOTAL, T3FREE, THYROIDAB in the last 72 hours.  Invalid input(s): FREET3 Anemia work up No results for input(s): VITAMINB12, FOLATE, FERRITIN, TIBC, IRON, RETICCTPCT in the last 72 hours. Microbiology Recent Results (from the past 240 hour(s))  Blood Culture (routine x 2)     Status: Abnormal   Collection Time: 10/14/19  6:46 PM   Specimen: BLOOD  Result Value Ref Range  Status   Specimen Description   Final    BLOOD PORTA CATH Performed at [facility] [address], [place], [place] [address]    Special Requests   Final    BOTTLES DRAWN AEROBIC AND ANAEROBIC Blood Culture adequate volume Performed at [facility] [address] [place], [place] [address]    Culture  Setup Time   Final    GRAM NEGATIVE RODS IN BOTH AEROBIC AND ANAEROBIC BOTTLES CRITICAL RESULT CALLED TO, READ BACK BY AND VERIFIED WITH: C. REED, RN (MCHP) AT [id number] ON 10/15/19 BY C. JESSUP, MT. Performed at [facility], [place] [address], [place], [place] [address]    Culture KLEBSIELLA PNEUMONIAE CITROBACTER FREUNDII  (A)  Final   Report Status 10/18/2019 FINAL  Final   Organism ID, Bacteria KLEBSIELLA PNEUMONIAE  Final   Organism ID, Bacteria CITROBACTER FREUNDII  Final      Susceptibility   Citrobacter freundii - MIC*    CEFAZOLIN >=64 RESISTANT Resistant     CEFEPIME <=0.12 SENSITIVE Sensitive     CEFTAZIDIME <=1 SENSITIVE Sensitive     CEFTRIAXONE <=0.25 SENSITIVE Sensitive     CIPROFLOXACIN <=0.25 SENSITIVE Sensitive     GENTAMICIN <=1 SENSITIVE Sensitive     IMIPENEM 1 SENSITIVE Sensitive     TRIMETH/SULFA <=20 SENSITIVE Sensitive     PIP/TAZO <=4 SENSITIVE Sensitive     * CITROBACTER FREUNDII   Klebsiella pneumoniae - MIC*    AMPICILLIN >=32 RESISTANT Resistant     CEFAZOLIN <=4 SENSITIVE Sensitive     CEFEPIME <=0.12 SENSITIVE Sensitive     CEFTAZIDIME <=1 SENSITIVE Sensitive     CEFTRIAXONE <=0.25 SENSITIVE Sensitive     CIPROFLOXACIN <=0.25 SENSITIVE Sensitive     GENTAMICIN <=1 SENSITIVE Sensitive     IMIPENEM <=0.25 SENSITIVE Sensitive     TRIMETH/SULFA <=20 SENSITIVE Sensitive     AMPICILLIN/SULBACTAM 4 SENSITIVE Sensitive     PIP/TAZO <=4 SENSITIVE Sensitive     * KLEBSIELLA PNEUMONIAE  Blood Culture ID Panel (Reflexed)     Status: Abnormal   Collection Time: 10/14/19  6:46 PM  Result Value Ref Range Status    Enterococcus species NOT DETECTED NOT DETECTED Final   Listeria monocytogenes NOT DETECTED NOT DETECTED Final   Staphylococcus species NOT DETECTED NOT DETECTED Final   Staphylococcus aureus (  BCID) NOT DETECTED NOT DETECTED Final   Streptococcus species NOT DETECTED NOT DETECTED Final   Streptococcus agalactiae NOT DETECTED NOT DETECTED Final   Streptococcus pneumoniae NOT DETECTED NOT DETECTED Final   Streptococcus pyogenes NOT DETECTED NOT DETECTED Final   Acinetobacter baumannii NOT DETECTED NOT DETECTED Final   Enterobacteriaceae species DETECTED (A) NOT DETECTED Final    Comment: Enterobacteriaceae represent a large family of gram-negative bacteria, not a single organism. CRITICAL RESULT CALLED TO, READ BACK BY AND VERIFIED WITH: C. REED, RN (MCHP) AT [id number] ON 10/15/19 BY C. JESSUP, MT.    Enterobacter cloacae complex NOT DETECTED NOT DETECTED Final   Escherichia coli NOT DETECTED NOT DETECTED Final   Klebsiella oxytoca NOT DETECTED NOT DETECTED Final   Klebsiella pneumoniae DETECTED (A) NOT DETECTED Final    Comment: CRITICAL RESULT CALLED TO, READ BACK BY AND VERIFIED WITH: C. REED, RN (MCHP) AT 1733 ON 10/15/19 BY C. JESSUP, MT.    Proteus species NOT DETECTED NOT DETECTED Final   Serratia marcescens NOT DETECTED NOT DETECTED Final   Carbapenem resistance NOT DETECTED NOT DETECTED Final   Haemophilus influenzae NOT DETECTED NOT DETECTED Final   Neisseria meningitidis NOT DETECTED NOT DETECTED Final   Pseudomonas aeruginosa NOT DETECTED NOT DETECTED Final   Candida albicans NOT DETECTED NOT DETECTED Final   Candida glabrata NOT DETECTED NOT DETECTED Final   Candida krusei NOT DETECTED NOT DETECTED Final   Candida parapsilosis NOT DETECTED NOT DETECTED Final   Candida tropicalis NOT DETECTED NOT DETECTED Final    Comment: Performed at [facility], [place] [address], [place], [place] [address]  Respiratory Panel by RT PCR (Flu A&B, Covid) - Nasopharyngeal Swab     Status: None    Collection Time: 10/14/19  6:48 PM   Specimen: Nasopharyngeal Swab  Result Value Ref Range Status   SARS Coronavirus 2 by RT PCR NEGATIVE NEGATIVE Final    Comment: (NOTE) SARS-CoV-2 target nucleic acids are NOT DETECTED. The SARS-CoV-2 RNA is generally detectable in upper respiratoy specimens during the acute phase of infection. The lowest concentration of SARS-CoV-2 viral copies this assay can detect is 131 copies/mL. A negative result does not preclude SARS-Cov-2 infection and should not be used as the sole basis for treatment or other patient management decisions. A negative result may occur with  improper specimen collection/handling, submission of specimen other than nasopharyngeal swab, presence of viral mutation(s) within the areas targeted by this assay, and inadequate number of viral copies (<131 copies/mL). A negative result must be combined with clinical observations, patient history, and epidemiological information. The expected result is Negative. Fact Sheet for Patients:  PinkCheek.be Fact Sheet for Healthcare Providers:  GravelBags.it This test is not yet ap proved or cleared by the [place] FDA and  has been authorized for detection and/or diagnosis of SARS-CoV-2 by FDA under an Emergency Use Authorization (EUA). This EUA will remain  in effect (meaning this test can be used) for the duration of the COVID-19 declaration under Section 564(b)(1) of the Act, 21 U.S.C. section 360bbb-3(b)(1), unless the authorization is terminated or revoked sooner.    Influenza A by PCR NEGATIVE NEGATIVE Final   Influenza B by PCR NEGATIVE NEGATIVE Final    Comment: (NOTE) The Xpert Xpress SARS-CoV-2/FLU/RSV assay is intended as an aid in  the diagnosis of influenza from Nasopharyngeal swab specimens and  should not be used as a sole basis for treatment. Nasal washings and  aspirates are unacceptable for Xpert Xpress  SARS-CoV-2/FLU/RSV  testing. Fact Sheet for Patients: PinkCheek.be Fact Sheet for Healthcare Providers: GravelBags.it This test is not yet approved or cleared by the [place] FDA and  has been authorized for detection and/or diagnosis of SARS-CoV-2 by  FDA under an Emergency Use Authorization (EUA). This EUA will remain  in effect (meaning this test can be used) for the duration of the  Covid-19 declaration under Section 564(b)(1) of the Act, 21  U.S.C. section 360bbb-3(b)(1), unless the authorization is  terminated or revoked. Performed at [facility], [place] [address] [place], [place] [address]   Blood Culture (routine x 2)     Status: Abnormal   Collection Time: 10/14/19  6:51 PM   Specimen: BLOOD LEFT HAND  Result Value Ref Range Status   Specimen Description   Final    BLOOD LEFT HAND Performed at [facility] [address], [place], [place] [address]    Special Requests   Final    BOTTLES DRAWN AEROBIC AND ANAEROBIC Blood Culture adequate volume Performed at [facility] [address] [place], [place] [address]    Culture  Setup Time   Final    GRAM NEGATIVE RODS IN BOTH AEROBIC AND ANAEROBIC BOTTLES CRITICAL RESULT CALLED TO, READ BACK BY AND VERIFIED WITH: C. REED, RN (MCHP) AT [id number] ON 10/15/19 BY C. JESSUP, MT.    Culture (A)  Final    KLEBSIELLA PNEUMONIAE CITROBACTER FREUNDII SUSCEPTIBILITIES PERFORMED ON PREVIOUS CULTURE WITHIN THE LAST 5 DAYS. Performed at [facility], [place] [address] [place], [place] [address]    Report Status 10/18/2019 FINAL  Final  Urine culture     Status: None   Collection Time: 10/15/19  1:06 AM   Specimen: In/Out Cath Urine  Result Value Ref Range Status   Specimen Description   Final    IN/OUT CATH URINE Performed at [facility] [address] [place], [place] [address]    Special  Requests   Final    NONE Performed at [facility], [place] [address] [place], [place] [address]    Culture   Final    NO GROWTH Performed at [facility], [place] [address], [place], [place] [address]    Report Status 10/16/2019 FINAL  Final  Culture, Blood     Status: None (Preliminary result)   Collection Time: 10/16/19 12:50 PM   Specimen: BLOOD  Result Value Ref Range Status   Specimen Description BLOOD LEFT ANTECUBITAL  Final   Special Requests   Final    BOTTLES DRAWN AEROBIC AND ANAEROBIC Blood Culture adequate volume   Culture   Final    NO GROWTH 3 DAYS Performed at [facility], [place] [address], [place], [place] [address]    Report Status PENDING  Incomplete  Culture, Blood     Status: None (Preliminary result)   Collection Time: 10/16/19 12:55 PM   Specimen: BLOOD  Result Value Ref Range Status   Specimen Description BLOOD PORTA CATH  Final   Special Requests   Final    BOTTLES DRAWN AEROBIC AND ANAEROBIC Blood Culture adequate volume   Culture   Final    NO GROWTH 3 DAYS Performed at [facility], [address], [place], [place] [address]    Report Status PENDING  Incomplete  Culture, blood (routine x 2)     Status: None (Preliminary result)   Collection Time: 10/17/19  7:31 PM  Specimen: BLOOD  Result Value Ref Range Status   Specimen Description   Final    BLOOD RIGHT ANTECUBITAL Performed at [facility] [address] [place], [place] [address]    Special Requests   Final    BOTTLES DRAWN AEROBIC AND ANAEROBIC Blood Culture adequate volume Performed at [facility] [address], [place], [place] [address]    Culture   Final    NO GROWTH 2 DAYS Performed at [facility] [address] [place], [place] [address]    Report Status PENDING  Incomplete  Culture, blood (routine x 2)     Status: None (Preliminary result)   Collection Time: 10/17/19  7:33 PM   Specimen:  BLOOD  Result Value Ref Range Status   Specimen Description   Final    BLOOD LEFT ANTECUBITAL Performed at [facility] [address], [place], [place] [address]    Special Requests   Final    BOTTLES DRAWN AEROBIC AND ANAEROBIC Blood Culture adequate volume Performed at [facility] [address] [place], [place] [address]    Culture   Final    NO GROWTH 2 DAYS Performed at [facility] [address], [place], [place] [address]    Report Status PENDING  Incomplete  C difficile quick scan w PCR reflex     Status: None   Collection Time: 10/18/19 11:36 AM   Specimen: STOOL  Result Value Ref Range Status   C Diff antigen NEGATIVE NEGATIVE Final   C Diff toxin NEGATIVE NEGATIVE Final   C Diff interpretation No C. difficile detected.  Final    Comment: Performed at [facility], [place] [address] [place], [place] [address]      Studies:  CT HEAD W & WO CONTRAST  Result Date: 10/19/2019 CLINICAL DATA:  Worsening confusion. EXAM: CT HEAD WITHOUT AND WITH CONTRAST TECHNIQUE: Contiguous axial images were obtained from the base of the skull through the vertex without and with intravenous contrast CONTRAST:  51mL OMNIPAQUE IOHEXOL 300 MG/ML  SOLN COMPARISON:  None. FINDINGS: Brain: Chronic atrophy of the brainstem and cerebellum. No acute finding in those regions. Cerebral hemispheres show atrophy accelerated for age. There is cortical and subcortical low-density at the left frontal vertex consistent with recent infarction. This area measures about 3.5 cm in diameter. Mild swelling but no evidence of hemorrhage or mass effect. This is presumed represent arterial infarction. Second focus of smaller suspected acute infarction affecting a right frontal vertex gyrus, also without hemorrhage or mass effect. No evidence of mass lesion, hemorrhage hydrocephalus or extra-axial collection. No abnormal contrast enhancement occurs.  Vascular: There is atherosclerotic calcification of the major vessels at the base of the brain. Skull: Negative Sinuses/Orbits: Clear/normal Other: None IMPRESSION: 3.5 cm region of cortical and subcortical low density at the left frontal vertex consistent with acute infarction. No hemorrhage or mass effect. Question second smaller focus of cortical infarction in the right frontal vertex, also without hemorrhage. This distribution would suggest embolic disease from the heart or ascending aorta. Brain atrophy elsewhere as noted above. Electronically Signed   By: Nelson Chimes M.D.   On: 10/19/2019 10:39    Assessment: 69 y.o.  1.  Bacteremia secondary to Klebsiella pneumoniae 2.  Rectal cancer metastasized to the liver 3.  History of pulmonary emboli 4.  Anemia of chronic disease and GI bleed 5.  Abnormal LFTs secondary to liver metastasis and recent  infection, improving 6.  Mild confusion, mild encephalopathy, mild expressive aphasia secondary to acute CVA 7.  CODE STATUS: DNR/DNI  Plan:  -The patient is having some increased confusion and difficulty word finding.  Will obtain a CT of the head with and without contrast to evaluate for CVA versus possible brain metastasis. -Repeat blood cultures remain negative.  He remains afebrile.  Per infectious disease, the patient will discharge home with IV antibiotics.  He will receive ceftriaxone daily to complete a 14-day course.  -The patient is actively bleeding and is more anemic today we will transfuse 2 units PRBCs today.  Hold Xarelto today and plan to resume tomorrow.  Hold Xarelto today and plan to resume tomorrow. -We will reevaluate the patient later today after the CT of the head is performed and after he receives his PRBCs to determine if he is stable for discharge.  ADDENDUM:  CT of the head showed a 3.5 cm region of the cortical and subcortical low-density at the left frontal vertex consistent with acute infarction.  There was no hemorrhage  or mass-effect.  CT scan has been discussed with neurology and they will see today.  Discussed with wife at the bedside who mentions to me that she has noticed he is eating smaller bites of food and sometimes clearing his throat with swallowing.  She expresses concern for aspiration.  Discussed with the patient about staying in the hospital for speech therapy evaluation.  His wife would agree for him to stay however the patient clearly told me "no" that he did not want to stay.   I talked with neurology after they saw the patient.  They recommend MRI of the brain as well as TEE.  They will discuss with cardiology regarding TEE.  The patient has now agreed to stay overnight for further work-up.  Mikey Bussing, NP 10/19/2019  4:01 PM   Addendum  I have seen the patient, examined him. I agree with the assessment and and plan and have edited the notes.   I spoke with pt and his wife at bedside around noon time. I reviewed his CT head findings. I suspect his stroke could be embolic. He had had recurrent DVT and PE, hypercoagulopathy from his underline malignancy, and not on full dose anticoagulation due to GI bleeding from his tumor. He actually has been having bloody BM lately. I will consult neuro. Given his overall very poor prognosis, he is likely going to hospice after he completes the course of antibiotics. I would not recommend extensive workup for his stroke. I discussed with pt and his wife about the above, and they agree.   Will give blood transfusion today, and hold Xarelto today due to bloody BM.   He has developed diarrhea, likely secondary to antibiotics. c-diff was negative yesterday. Will give imodium.  Truitt Merle  10/19/2019

## 2019-10-19 NOTE — Consult Note (Signed)
Neurology Consultation Reason for Consult: Stroke Referring Physician: Ky Barban  CC: Stroke  History is obtained from:Patient, wife  HPI: Billy Mendoza is a 69 y.o. male with a history of colorectal cancer with liver/lung mets who was admitted to the hospital with polymicrobial (Citrobacter, klebsiella) bacteremia. He has been started on ceftriaxone. He was noted to be confused but this was attributed to his infection/Ambien.  He has a history of GI bleed, and had a maroon stool yesterday, but currently holding xarelto.   Due to his confusion, CT head was obtained which demonstrates an area of infarct in the distal left ACA distribution.  His wife also notes that he has demonstrated some trouble swallowing a couple of times since being admitted.  He denies any other symptoms other than "not being that sharp."    LKW: Monday tpa given?: no, stroke not initially suspected    ROS: A 14 point ROS was performed and is negative except as noted in the HPI.   Past Medical History:  Diagnosis Date  . colorectal ca dx'd 07/2018  . Liver metastases ([place]) dx'd 07/2018  . Lung metastases ([place]) dx'd 07/2018     Family History  Problem Relation Age of Onset  . Cancer Mother 64       breast cancer  . Atrial fibrillation Father   . Heart failure Father   . Cancer Maternal Grandmother        lung cancer      Social History:  reports that he has quit smoking. His smoking use included cigarettes and cigars. He quit after 10.00 years of use. He has never used smokeless tobacco. He reports current alcohol use of about 1.0 - 2.0 standard drinks of alcohol per week. He reports that he does not use drugs.   Exam: Current vital signs: BP 120/79 (BP Location: Right Arm)   Pulse 83   Temp 97.6 F (36.4 C) (Oral)   Resp 15   Wt 91.6 kg   SpO2 95%   BMI 27.40 kg/m  Vital signs in last 24 hours: Temp:  [97.6 F (36.4 C)-98.4 F (36.9 C)] 97.6 F (36.4 C) (01/14 1520) Pulse Rate:   [81-90] 83 (01/14 1520) Resp:  [15-17] 15 (01/14 1520) BP: (112-125)/(71-82) 120/79 (01/14 1520) SpO2:  [94 %-97 %] 95 % (01/14 1520)   Physical Exam  Constitutional: Appears well-developed and well-nourished.  Psych: Affect appropriate to situation Eyes: No scleral injection HENT: No OP obstrucion MSK: no joint deformities.  Cardiovascular: Normal rate and regular rhythm.  Respiratory: Effort normal, non-labored breathing GI: Soft.  No distension. There is no tenderness.  Skin: WDI  Neuro: Mental Status: Patient is awake, alert, oriented to person, place, month, year, and situation. Patient is able to give a clear and coherent history. No signs of neglect He has a mild to moderate expressive aphasia.  He has latency of response as well as some perseveration.  He is able to name objects, though with some perseverative errors, and able to repeat. Cranial Nerves: II: Visual Fields are full. Pupils are equal, round, and reactive to light.   III,IV, VI: EOMI without ptosis or diploplia.  V: Facial sensation is symmetric to temperature VII: Facial movement is symmetric.  VIII: hearing is intact to voice X: Uvula elevates symmetrically XI: Shoulder shrug is symmetric. XII: tongue is midline without atrophy or fasciculations.  Motor: Tone is normal. Bulk is normal. 5/5 strength was present in all four extremities.  Sensory: Sensation is symmetric to light  touch and temperature in the arms and legs. Cerebellar: FNF  intact bilaterally      I have reviewed labs in epic and the results pertinent to this consultation are: Creatinine 0.58  I have reviewed the images obtained: CT head-distal left ACA stroke  Impression: 69 year old male with strokes which is likely embolic in nature.  Family has been considering reducing the aggressiveness of their care, but as of now are wanting to continue treating the treatable.   The etiology of his stroke is currently unclear.  Certainly  with embolic phenomenon in the setting of bacteremia, the question of endocarditis is raised.  Also, with septic emboli, mycotic aneurysms and hemorrhagic complications are more common and therefore I do think that MRI with MRA would be useful in helping judge risk going forward.  He does have another reason for anticoagulation, so unless there was something concerning on the MRI, I do not think anticoagulation would be contraindicated.  Antiplatelet/anticoagulants are currently on hold due to GI bleed.  He is already had a physical therapy evaluation, but still needs speech therapy, both for language and swallowing  Recommendations: 1) MRI brain MRA head and neck 2) transesophageal echocardiogram 3) speech therapy evaluation   Roland Rack, MD [facility] [phone]  If 7pm- 7am, please page neurology on call as listed in [place].

## 2019-10-19 NOTE — Progress Notes (Signed)
    [facility] has been requested to perform a transesophageal echocardiogram on Billy Mendoza for bacteremia.  After careful review of history and examination, the risks and benefits of transesophageal echocardiogram have been explained including risks of esophageal damage, perforation (1:10,000 risk), bleeding, pharyngeal hematoma as well as other potential complications associated with conscious sedation including aspiration, arrhythmia, respiratory failure and death. Alternatives to treatment were discussed, questions were answered. Patient and family are willing to proceed.    Kathyrn Drown, NP  10/19/2019 4:25 PM

## 2019-10-19 NOTE — Progress Notes (Signed)
INFECTIOUS DISEASE PROGRESS NOTE  ID: Billy Mendoza is a 69 y.o. male with  Active Problems:   Bacteremia due to Klebsiella pneumoniae  Subjective: Difficulty with word finding (when pt near d/c today he developed confusion, sent for urgent CT, eval by neuro).   Abtx:  Anti-infectives (From admission, onward)   Start     Dose/Rate Route Frequency Ordered Stop   10/20/19 0000  cefTRIAXone (ROCEPHIN) IVPB     2 g Intravenous Every 24 hours 10/19/19 1259 10/29/19 2359   10/18/19 1200  cefTRIAXone (ROCEPHIN) 2 g in sodium chloride 0.9 % 100 mL IVPB     2 g 200 mL/hr over 30 Minutes Intravenous Every 24 hours 10/18/19 1136 10/29/19 1159   10/16/19 1600  ceFEPIme (MAXIPIME) 2 g in sodium chloride 0.9 % 100 mL IVPB  Status:  Discontinued     2 g 200 mL/hr over 30 Minutes Intravenous Every 8 hours 10/16/19 1518 10/18/19 1136   10/16/19 1500  piperacillin-tazobactam (ZOSYN) IVPB 3.375 g  Status:  Discontinued     3.375 g 12.5 mL/hr over 240 Minutes Intravenous Every 8 hours 10/16/19 1404 10/16/19 1516      Medications:  Scheduled: . Chlorhexidine Gluconate Cloth  6 each Topical Daily  . docusate sodium  100 mg Oral BID  . feeding supplement (ENSURE ENLIVE)  237 mL Oral BID BM  . magnesium oxide  200 mg Oral BID  . morphine  30 mg Oral Q12H  . multivitamin with minerals  1 tablet Oral Daily  . potassium chloride SA  20 mEq Oral Daily  . sodium chloride (PF)        Objective: Vital signs in last 24 hours: Temp:  [97.6 F (36.4 C)-98.4 F (36.9 C)] 97.7 F (36.5 C) (01/14 1726) Pulse Rate:  [81-90] 87 (01/14 1726) Resp:  [15-17] 16 (01/14 1726) BP: (112-125)/(71-82) 118/78 (01/14 1726) SpO2:  [94 %-97 %] 95 % (01/14 1726)   General appearance: alert, cooperative and no distress Resp: clear to auscultation bilaterally Chest wall: no tenderness, port site clean, non-tender, no erythema Cardio: regular rate and rhythm GI: normal findings: bowel sounds normal and soft,  non-tender  Lab Results Recent Labs    10/18/19 0536 10/19/19 0444  WBC 10.3 11.0*  HGB 8.5* 7.5*  HCT 27.9* 25.2*  NA 135 136  K 3.5 3.6  CL 103 106  CO2 20* 22  BUN 11 14  CREATININE 0.69 0.58*   Liver Panel Recent Labs    10/18/19 0536 10/19/19 0444  PROT 6.3* 5.6*  ALBUMIN 2.0* 1.8*  AST 74* 67*  ALT 94* 69*  ALKPHOS 1,417* 1,178*  BILITOT 1.6* 1.0   Sedimentation Rate No results for input(s): ESRSEDRATE in the last 72 hours. C-Reactive Protein No results for input(s): CRP in the last 72 hours.  Microbiology: Recent Results (from the past 240 hour(s))  Blood Culture (routine x 2)     Status: Abnormal   Collection Time: 10/14/19  6:46 PM   Specimen: BLOOD  Result Value Ref Range Status   Specimen Description   Final    BLOOD PORTA CATH Performed at [facility] [address] [place], [place] [address]    Special Requests   Final    BOTTLES DRAWN AEROBIC AND ANAEROBIC Blood Culture adequate volume Performed at [facility] [address] [place], [place] [address]    Culture  Setup Time   Final    GRAM NEGATIVE RODS IN BOTH AEROBIC  AND ANAEROBIC BOTTLES CRITICAL RESULT CALLED TO, READ BACK BY AND VERIFIED WITH: C. REED, RN (MCHP) AT [id number] ON 10/15/19 BY C. JESSUP, MT. Performed at [facility], [place] [address] [place], [place] [address]    Culture KLEBSIELLA PNEUMONIAE CITROBACTER FREUNDII  (A)  Final   Report Status 10/18/2019 FINAL  Final   Organism ID, Bacteria KLEBSIELLA PNEUMONIAE  Final   Organism ID, Bacteria CITROBACTER FREUNDII  Final      Susceptibility   Citrobacter freundii - MIC*    CEFAZOLIN >=64 RESISTANT Resistant     CEFEPIME <=0.12 SENSITIVE Sensitive     CEFTAZIDIME <=1 SENSITIVE Sensitive     CEFTRIAXONE <=0.25 SENSITIVE Sensitive     CIPROFLOXACIN <=0.25 SENSITIVE Sensitive     GENTAMICIN <=1 SENSITIVE Sensitive     IMIPENEM 1 SENSITIVE Sensitive     TRIMETH/SULFA <=20 SENSITIVE  Sensitive     PIP/TAZO <=4 SENSITIVE Sensitive     * CITROBACTER FREUNDII   Klebsiella pneumoniae - MIC*    AMPICILLIN >=32 RESISTANT Resistant     CEFAZOLIN <=4 SENSITIVE Sensitive     CEFEPIME <=0.12 SENSITIVE Sensitive     CEFTAZIDIME <=1 SENSITIVE Sensitive     CEFTRIAXONE <=0.25 SENSITIVE Sensitive     CIPROFLOXACIN <=0.25 SENSITIVE Sensitive     GENTAMICIN <=1 SENSITIVE Sensitive     IMIPENEM <=0.25 SENSITIVE Sensitive     TRIMETH/SULFA <=20 SENSITIVE Sensitive     AMPICILLIN/SULBACTAM 4 SENSITIVE Sensitive     PIP/TAZO <=4 SENSITIVE Sensitive     * KLEBSIELLA PNEUMONIAE  Blood Culture ID Panel (Reflexed)     Status: Abnormal   Collection Time: 10/14/19  6:46 PM  Result Value Ref Range Status   Enterococcus species NOT DETECTED NOT DETECTED Final   Listeria monocytogenes NOT DETECTED NOT DETECTED Final   Staphylococcus species NOT DETECTED NOT DETECTED Final   Staphylococcus aureus (BCID) NOT DETECTED NOT DETECTED Final   Streptococcus species NOT DETECTED NOT DETECTED Final   Streptococcus agalactiae NOT DETECTED NOT DETECTED Final   Streptococcus pneumoniae NOT DETECTED NOT DETECTED Final   Streptococcus pyogenes NOT DETECTED NOT DETECTED Final   Acinetobacter baumannii NOT DETECTED NOT DETECTED Final   Enterobacteriaceae species DETECTED (A) NOT DETECTED Final    Comment: Enterobacteriaceae represent a large family of gram-negative bacteria, not a single organism. CRITICAL RESULT CALLED TO, READ BACK BY AND VERIFIED WITH: C. REED, RN (MCHP) AT [id number] ON 10/15/19 BY C. JESSUP, MT.    Enterobacter cloacae complex NOT DETECTED NOT DETECTED Final   Escherichia coli NOT DETECTED NOT DETECTED Final   Klebsiella oxytoca NOT DETECTED NOT DETECTED Final   Klebsiella pneumoniae DETECTED (A) NOT DETECTED Final    Comment: CRITICAL RESULT CALLED TO, READ BACK BY AND VERIFIED WITH: C. REED, RN (MCHP) AT 1733 ON 10/15/19 BY C. JESSUP, MT.    Proteus species NOT DETECTED NOT DETECTED  Final   Serratia marcescens NOT DETECTED NOT DETECTED Final   Carbapenem resistance NOT DETECTED NOT DETECTED Final   Haemophilus influenzae NOT DETECTED NOT DETECTED Final   Neisseria meningitidis NOT DETECTED NOT DETECTED Final   Pseudomonas aeruginosa NOT DETECTED NOT DETECTED Final   Candida albicans NOT DETECTED NOT DETECTED Final   Candida glabrata NOT DETECTED NOT DETECTED Final   Candida krusei NOT DETECTED NOT DETECTED Final   Candida parapsilosis NOT DETECTED NOT DETECTED Final   Candida tropicalis NOT DETECTED NOT DETECTED Final    Comment: Performed at [facility], [place]  [address], [place], [place] [address]  Respiratory Panel by RT PCR (Flu A&B, Covid) - Nasopharyngeal Swab     Status: None   Collection Time: 10/14/19  6:48 PM   Specimen: Nasopharyngeal Swab  Result Value Ref Range Status   SARS Coronavirus 2 by RT PCR NEGATIVE NEGATIVE Final    Comment: (NOTE) SARS-CoV-2 target nucleic acids are NOT DETECTED. The SARS-CoV-2 RNA is generally detectable in upper respiratoy specimens during the acute phase of infection. The lowest concentration of SARS-CoV-2 viral copies this assay can detect is 131 copies/mL. A negative result does not preclude SARS-Cov-2 infection and should not be used as the sole basis for treatment or other patient management decisions. A negative result may occur with  improper specimen collection/handling, submission of specimen other than nasopharyngeal swab, presence of viral mutation(s) within the areas targeted by this assay, and inadequate number of viral copies (<131 copies/mL). A negative result must be combined with clinical observations, patient history, and epidemiological information. The expected result is Negative. Fact Sheet for Patients:  PinkCheek.be Fact Sheet for Healthcare Providers:  GravelBags.it This test is not yet ap proved or cleared by the [place] FDA  and  has been authorized for detection and/or diagnosis of SARS-CoV-2 by FDA under an Emergency Use Authorization (EUA). This EUA will remain  in effect (meaning this test can be used) for the duration of the COVID-19 declaration under Section 564(b)(1) of the Act, 21 U.S.C. section 360bbb-3(b)(1), unless the authorization is terminated or revoked sooner.    Influenza A by PCR NEGATIVE NEGATIVE Final   Influenza B by PCR NEGATIVE NEGATIVE Final    Comment: (NOTE) The Xpert Xpress SARS-CoV-2/FLU/RSV assay is intended as an aid in  the diagnosis of influenza from Nasopharyngeal swab specimens and  should not be used as a sole basis for treatment. Nasal washings and  aspirates are unacceptable for Xpert Xpress SARS-CoV-2/FLU/RSV  testing. Fact Sheet for Patients: PinkCheek.be Fact Sheet for Healthcare Providers: GravelBags.it This test is not yet approved or cleared by the [place] FDA and  has been authorized for detection and/or diagnosis of SARS-CoV-2 by  FDA under an Emergency Use Authorization (EUA). This EUA will remain  in effect (meaning this test can be used) for the duration of the  Covid-19 declaration under Section 564(b)(1) of the Act, 21  U.S.C. section 360bbb-3(b)(1), unless the authorization is  terminated or revoked. Performed at [facility], [place] [address], [place], [place] [address]   Blood Culture (routine x 2)     Status: Abnormal   Collection Time: 10/14/19  6:51 PM   Specimen: BLOOD LEFT HAND  Result Value Ref Range Status   Specimen Description   Final    BLOOD LEFT HAND Performed at [facility] [address], [place], [place] [address]    Special Requests   Final    BOTTLES DRAWN AEROBIC AND ANAEROBIC Blood Culture adequate volume Performed at [facility] [address], [place], [place] [address]    Culture  Setup Time   Final     GRAM NEGATIVE RODS IN BOTH AEROBIC AND ANAEROBIC BOTTLES CRITICAL RESULT CALLED TO, READ BACK BY AND VERIFIED WITH: C. REED, RN (MCHP) AT [id number] ON 10/15/19 BY C. JESSUP, MT.    Culture (A)  Final    KLEBSIELLA PNEUMONIAE CITROBACTER FREUNDII SUSCEPTIBILITIES PERFORMED ON PREVIOUS CULTURE WITHIN THE LAST 5 DAYS. Performed at [facility], [place] [address] [place], [place] [address]  Report Status 10/18/2019 FINAL  Final  Urine culture     Status: None   Collection Time: 10/15/19  1:06 AM   Specimen: In/Out Cath Urine  Result Value Ref Range Status   Specimen Description   Final    IN/OUT CATH URINE Performed at [facility], [place] [address], [place], [place] [address]    Special Requests   Final    NONE Performed at [facility], [place] [address], [place], [place] [address]    Culture   Final    NO GROWTH Performed at [facility], [place] [address], [place], [place] [address]    Report Status 10/16/2019 FINAL  Final  Culture, Blood     Status: None (Preliminary result)   Collection Time: 10/16/19 12:50 PM   Specimen: BLOOD  Result Value Ref Range Status   Specimen Description BLOOD LEFT ANTECUBITAL  Final   Special Requests   Final    BOTTLES DRAWN AEROBIC AND ANAEROBIC Blood Culture adequate volume   Culture   Final    NO GROWTH 3 DAYS Performed at [facility], [place] [address], [place], [place] [address]    Report Status PENDING  Incomplete  Culture, Blood     Status: None (Preliminary result)   Collection Time: 10/16/19 12:55 PM   Specimen: BLOOD  Result Value Ref Range Status   Specimen Description BLOOD PORTA CATH  Final   Special Requests   Final    BOTTLES DRAWN AEROBIC AND ANAEROBIC Blood Culture adequate volume   Culture   Final    NO GROWTH 3 DAYS Performed at [facility], [address], [place], [place] [address]    Report Status PENDING  Incomplete  Culture, blood (routine x 2)     Status: None  (Preliminary result)   Collection Time: 10/17/19  7:31 PM   Specimen: BLOOD  Result Value Ref Range Status   Specimen Description   Final    BLOOD RIGHT ANTECUBITAL Performed at [facility] [address], [place], [place] [address]    Special Requests   Final    BOTTLES DRAWN AEROBIC AND ANAEROBIC Blood Culture adequate volume Performed at [facility] [address] [place], [place] [address]    Culture   Final    NO GROWTH 2 DAYS Performed at [facility] [address], [place], [place] [address]    Report Status PENDING  Incomplete  Culture, blood (routine x 2)     Status: None (Preliminary result)   Collection Time: 10/17/19  7:33 PM   Specimen: BLOOD  Result Value Ref Range Status   Specimen Description   Final    BLOOD LEFT ANTECUBITAL Performed at [facility] [address], [place], [place] [address]    Special Requests   Final    BOTTLES DRAWN AEROBIC AND ANAEROBIC Blood Culture adequate volume Performed at [facility] [address] [place], [place] [address]    Culture   Final    NO GROWTH 2 DAYS Performed at [facility] [address] [place], [place] [address]    Report Status PENDING  Incomplete  C difficile quick scan w PCR reflex     Status: None   Collection Time: 10/18/19 11:36 AM   Specimen: STOOL  Result Value Ref Range Status   C Diff antigen NEGATIVE NEGATIVE Final   C Diff toxin NEGATIVE NEGATIVE Final  C Diff interpretation No C. difficile detected.  Final    Comment: Performed at [facility], [place] [address], [place], [place] [address]    Studies/Results: CT HEAD W & WO CONTRAST  Result Date: 10/19/2019 CLINICAL DATA:  Worsening confusion. EXAM: CT HEAD WITHOUT AND WITH CONTRAST TECHNIQUE: Contiguous axial images were obtained from the base of the skull through the vertex without and with intravenous contrast CONTRAST:  57mL  OMNIPAQUE IOHEXOL 300 MG/ML  SOLN COMPARISON:  None. FINDINGS: Brain: Chronic atrophy of the brainstem and cerebellum. No acute finding in those regions. Cerebral hemispheres show atrophy accelerated for age. There is cortical and subcortical low-density at the left frontal vertex consistent with recent infarction. This area measures about 3.5 cm in diameter. Mild swelling but no evidence of hemorrhage or mass effect. This is presumed represent arterial infarction. Second focus of smaller suspected acute infarction affecting a right frontal vertex gyrus, also without hemorrhage or mass effect. No evidence of mass lesion, hemorrhage hydrocephalus or extra-axial collection. No abnormal contrast enhancement occurs. Vascular: There is atherosclerotic calcification of the major vessels at the base of the brain. Skull: Negative Sinuses/Orbits: Clear/normal Other: None IMPRESSION: 3.5 cm region of cortical and subcortical low density at the left frontal vertex consistent with acute infarction. No hemorrhage or mass effect. Question second smaller focus of cortical infarction in the right frontal vertex, also without hemorrhage. This distribution would suggest embolic disease from the heart or ascending aorta. Brain atrophy elsewhere as noted above. Electronically Signed   By: Nelson Chimes M.D.   On: 10/19/2019 10:39     Assessment/Plan: Klebsiella bacteremia Embolic CVA Stage IVb rectal cancer (2019) Port in place Hepatitis, obstructive  Total days of antibiotics: 3 ceftriaxone Agree with TEE Would strongly consider pulling his port given embolic compication Await repeat BCx, so far ngtd.  His ceftriaxone dose is appropriate         Bobby Rumpf MD, FACP Infectious Diseases (pager) [phone] [email or web address] 10/19/2019, 6:41 PM  LOS: 3 days

## 2019-10-19 NOTE — Progress Notes (Signed)
Chaplain notarized advance directive.      Noted pt documented as confused in previous notes.  On chaplain assessment at bedside, pt is oriented to person, place, time, and situation.  On open-ended question regarding nature of document, pt was able to describe document. When asked who is listed as HCPOA, he recalled appropriately.    Chaplain notarized document with witnesses.  Copy placed in chart.  Original and copies with pt.     Jerene Pitch, MDiv, [facility]  Lead Clinical Chaplain  [place]

## 2019-10-19 NOTE — Progress Notes (Deleted)
Chaplain notarized advance directive.    Noted that pt. documented as confused in previous notes.   Chaplain assessed patient at bedside.  Pt was oriented to person, place, time

## 2019-10-19 NOTE — Progress Notes (Signed)
[facility] set up for transfer from [facility] to [facility] on 10/20/19 at 0900 to be at [facility] at 0930. Jobe Igo, RN

## 2019-10-20 ENCOUNTER — Inpatient Hospital Stay (HOSPITAL_COMMUNITY): Payer: BC Managed Care – PPO | Admitting: Anesthesiology

## 2019-10-20 ENCOUNTER — Inpatient Hospital Stay (HOSPITAL_COMMUNITY): Payer: BC Managed Care – PPO

## 2019-10-20 ENCOUNTER — Encounter (HOSPITAL_COMMUNITY)
Admission: AD | Disposition: A | Discharge status: Home Health | Payer: Self-pay | Source: Ambulatory Visit | Attending: Hematology

## 2019-10-20 ENCOUNTER — Encounter (HOSPITAL_COMMUNITY): Payer: Self-pay | Admitting: Hematology

## 2019-10-20 ENCOUNTER — Encounter: Payer: Self-pay | Admitting: Hematology

## 2019-10-20 DIAGNOSIS — I340 Nonrheumatic mitral (valve) insufficiency: Secondary | ICD-10-CM

## 2019-10-20 DIAGNOSIS — I38 Endocarditis, valve unspecified: Secondary | ICD-10-CM

## 2019-10-20 DIAGNOSIS — I330 Acute and subacute infective endocarditis: Principal | ICD-10-CM

## 2019-10-20 DIAGNOSIS — Z8673 Personal history of transient ischemic attack (TIA), and cerebral infarction without residual deficits: Secondary | ICD-10-CM

## 2019-10-20 DIAGNOSIS — R7881 Bacteremia: Secondary | ICD-10-CM

## 2019-10-20 HISTORY — PX: IR REMOVAL TUN ACCESS W/ PORT W/O FL MOD SED: IMG2290

## 2019-10-20 HISTORY — PX: BUBBLE STUDY: SHX6837

## 2019-10-20 HISTORY — PX: TEE WITHOUT CARDIOVERSION: SHX5443

## 2019-10-20 LAB — BLOOD CULTURE ID PANEL (REFLEXED)

## 2019-10-20 LAB — COMPREHENSIVE METABOLIC PANEL
ALT: 65 U/L — ABNORMAL HIGH (ref 0–44)
AST: 73 U/L — ABNORMAL HIGH (ref 15–41)
Albumin: 1.9 g/dL — ABNORMAL LOW (ref 3.5–5.0)
Alkaline Phosphatase: 1163 U/L — ABNORMAL HIGH (ref 38–126)
Anion gap: 11 (ref 5–15)
BUN: 13 mg/dL (ref 8–23)
CO2: 23 mmol/L (ref 22–32)
Calcium: 7.9 mg/dL — ABNORMAL LOW (ref 8.9–10.3)
Chloride: 104 mmol/L (ref 98–111)
Creatinine, Ser: 0.55 mg/dL — ABNORMAL LOW (ref 0.61–1.24)
GFR calc Af Amer: >60 mL/min (ref >60)
GFR calc non Af Amer: >60 mL/min (ref >60)
Glucose, Bld: 114 mg/dL — ABNORMAL HIGH (ref 70–99)
Potassium: 3.7 mmol/L (ref 3.5–5.1)
Sodium: 138 mmol/L (ref 135–145)
Total Bilirubin: 2.1 mg/dL — ABNORMAL HIGH (ref 0.3–1.2)
Total Protein: 5.7 g/dL — ABNORMAL LOW (ref 6.5–8.1)

## 2019-10-20 LAB — TYPE AND SCREEN
ABO/RH(D): O POS
Antibody Screen: NEGATIVE
Unit division: 0
Unit division: 0

## 2019-10-20 LAB — CBC WITH DIFFERENTIAL/PLATELET
Abs Immature Granulocytes: 0.24 10*3/uL — ABNORMAL HIGH (ref 0.00–0.07)
Basophils Absolute: 0 10*3/uL (ref 0.0–0.1)
Basophils Relative: 0 %
Eosinophils Absolute: 0.2 10*3/uL (ref 0.0–0.5)
Eosinophils Relative: 1 %
HCT: 30.6 % — ABNORMAL LOW (ref 39.0–52.0)
Hemoglobin: 9.5 g/dL — ABNORMAL LOW (ref 13.0–17.0)
Immature Granulocytes: 2 %
Lymphocytes Relative: 8 %
Lymphs Abs: 0.9 10*3/uL (ref 0.7–4.0)
MCH: 30.1 pg (ref 26.0–34.0)
MCHC: 31 g/dL (ref 30.0–36.0)
MCV: 96.8 fL (ref 80.0–100.0)
Monocytes Absolute: 1.1 10*3/uL — ABNORMAL HIGH (ref 0.1–1.0)
Monocytes Relative: 9 %
Neutro Abs: 9.5 10*3/uL — ABNORMAL HIGH (ref 1.7–7.7)
Neutrophils Relative %: 80 %
Platelets: 91 10*3/uL — ABNORMAL LOW (ref 150–400)
RBC: 3.16 MIL/uL — ABNORMAL LOW (ref 4.22–5.81)
RDW: 17.4 % — ABNORMAL HIGH (ref 11.5–15.5)
WBC: 11.9 10*3/uL — ABNORMAL HIGH (ref 4.0–10.5)
nRBC: 0 % (ref 0.0–0.2)

## 2019-10-20 LAB — GLUCOSE, CAPILLARY: Glucose-Capillary: 114 mg/dL — ABNORMAL HIGH (ref 70–99)

## 2019-10-20 LAB — BPAM RBC
Blood Product Expiration Date: 202102122359
Blood Product Expiration Date: 202102122359
ISSUE DATE / TIME: 202101141434
ISSUE DATE / TIME: 202101142312
Unit Type and Rh: 5100
Unit Type and Rh: 5100

## 2019-10-20 SURGERY — ECHOCARDIOGRAM, TRANSESOPHAGEAL
Anesthesia: Monitor Anesthesia Care

## 2019-10-20 MED ORDER — GADOBUTROL 1 MMOL/ML IV SOLN: 10.0000 mL | Freq: Once | INTRAVENOUS | Status: DC | PRN

## 2019-10-20 MED ORDER — MORPHINE SULFATE (PF) 4 MG/ML IV SOLN
3.0000 mg | INTRAVENOUS | Status: DC | PRN
  Administered 2019-10-20 (×3): 3 mg via INTRAVENOUS
  Filled 2019-10-20 (×3): qty 1

## 2019-10-20 MED ORDER — MIDAZOLAM HCL 2 MG/2ML IJ SOLN
INTRAMUSCULAR | Status: AC | PRN
  Administered 2019-10-20 (×2): 1 mg via INTRAVENOUS

## 2019-10-20 MED ORDER — LIDOCAINE-EPINEPHRINE 1 %-1:100000 IJ SOLN
INTRAMUSCULAR | Status: AC
  Filled 2019-10-20: qty 1

## 2019-10-20 MED ORDER — FENTANYL CITRATE (PF) 100 MCG/2ML IJ SOLN
INTRAMUSCULAR | Status: AC | PRN
  Administered 2019-10-20 (×2): 50 ug via INTRAVENOUS

## 2019-10-20 MED ORDER — SODIUM CHLORIDE 0.9 % IV SOLN: 2.0000 g | INTRAVENOUS | Status: AC

## 2019-10-20 MED ORDER — RIVAROXABAN 10 MG PO TABS: 10.0000 mg | ORAL_TABLET | Freq: Every day | ORAL | Status: AC

## 2019-10-20 MED ORDER — SODIUM CHLORIDE 0.9 % IV SOLN: INTRAVENOUS | Status: DC

## 2019-10-20 MED ORDER — HEPARIN SOD (PORK) LOCK FLUSH 100 UNIT/ML IV SOLN
INTRAVENOUS | Status: AC
  Filled 2019-10-20: qty 5

## 2019-10-20 MED ORDER — MIDAZOLAM HCL 2 MG/2ML IJ SOLN
INTRAMUSCULAR | Status: AC
  Filled 2019-10-20: qty 4

## 2019-10-20 MED ORDER — PROPOFOL 10 MG/ML IV BOLUS
INTRAVENOUS | Status: DC | PRN
  Administered 2019-10-20: 20 mg via INTRAVENOUS

## 2019-10-20 MED ORDER — PROPOFOL 500 MG/50ML IV EMUL
INTRAVENOUS | Status: DC | PRN
  Administered 2019-10-20: 100 ug/kg/min via INTRAVENOUS

## 2019-10-20 MED ORDER — CEFAZOLIN SODIUM-DEXTROSE 2-4 GM/100ML-% IV SOLN
INTRAVENOUS | Status: AC
  Filled 2019-10-20: qty 100

## 2019-10-20 MED ORDER — FENTANYL CITRATE (PF) 100 MCG/2ML IJ SOLN
INTRAMUSCULAR | Status: AC
  Filled 2019-10-20: qty 2

## 2019-10-20 NOTE — Anesthesia Procedure Notes (Signed)
Procedure Name: MAC Date/Time: 10/20/2019 10:40 AM Performed by: Jenne Campus, CRNA Pre-anesthesia Checklist: Patient identified, Emergency Drugs available, Suction available and Patient being monitored Oxygen Delivery Method: Nasal cannula

## 2019-10-20 NOTE — Progress Notes (Signed)
Reviewed discharge paperwork with wife. Walker and BSC were provided to patient. Patient discharged via wheelchair by NT and RN into care of wife.

## 2019-10-20 NOTE — Anesthesia Postprocedure Evaluation (Signed)
Anesthesia Post Note  Patient: Billy Mendoza  Procedure(s) Performed: TRANSESOPHAGEAL ECHOCARDIOGRAM (TEE) (N/A ) BUBBLE STUDY     Patient location during evaluation: PACU Anesthesia Type: MAC Level of consciousness: awake and alert Pain management: pain level controlled Vital Signs Assessment: post-procedure vital signs reviewed and stable Respiratory status: spontaneous breathing, nonlabored ventilation and respiratory function stable Cardiovascular status: stable and blood pressure returned to baseline Postop Assessment: no apparent nausea or vomiting Anesthetic complications: no    Last Vitals:  Vitals:   10/20/19 1120 10/20/19 1130  BP: (!) 144/80 (!) 147/88  Pulse: 79 81  Resp: 16 17  Temp:    SpO2: 97% 96%    Last Pain:  Vitals:   10/20/19 1110  TempSrc: Temporal  PainSc: 0-No pain                 Lynda Rainwater

## 2019-10-20 NOTE — Progress Notes (Signed)
PHARMACY - PHYSICIAN COMMUNICATION CRITICAL VALUE ALERT - BLOOD CULTURE IDENTIFICATION (BCID)  Billy Mendoza is an 69 y.o. male who presented to [facility] on 10/16/2019 with a chief complaint of bacteremia  Assessment:  1 of 4 with GNR nothing on BCID  Name of physician (or Provider) Contacted: Burr Medico  Current antibiotics: Rocephin  Changes to prescribed antibiotics recommended:  No changes  Results for orders placed or performed during the hospital encounter of 10/14/19  Blood Culture ID Panel (Reflexed) (Collected: 10/14/2019  6:46 PM)  Result Value Ref Range   Enterococcus species NOT DETECTED NOT DETECTED   Listeria monocytogenes NOT DETECTED NOT DETECTED   Staphylococcus species NOT DETECTED NOT DETECTED   Staphylococcus aureus (BCID) NOT DETECTED NOT DETECTED   Streptococcus species NOT DETECTED NOT DETECTED   Streptococcus agalactiae NOT DETECTED NOT DETECTED   Streptococcus pneumoniae NOT DETECTED NOT DETECTED   Streptococcus pyogenes NOT DETECTED NOT DETECTED   Acinetobacter baumannii NOT DETECTED NOT DETECTED   Enterobacteriaceae species DETECTED (A) NOT DETECTED   Enterobacter cloacae complex NOT DETECTED NOT DETECTED   Escherichia coli NOT DETECTED NOT DETECTED   Klebsiella oxytoca NOT DETECTED NOT DETECTED   Klebsiella pneumoniae DETECTED (A) NOT DETECTED   Proteus species NOT DETECTED NOT DETECTED   Serratia marcescens NOT DETECTED NOT DETECTED   Carbapenem resistance NOT DETECTED NOT DETECTED   Haemophilus influenzae NOT DETECTED NOT DETECTED   Neisseria meningitidis NOT DETECTED NOT DETECTED   Pseudomonas aeruginosa NOT DETECTED NOT DETECTED   Candida albicans NOT DETECTED NOT DETECTED   Candida glabrata NOT DETECTED NOT DETECTED   Candida krusei NOT DETECTED NOT DETECTED   Candida parapsilosis NOT DETECTED NOT DETECTED   Candida tropicalis NOT DETECTED NOT DETECTED    Kara Mead 10/20/2019  4:59 PM

## 2019-10-20 NOTE — Anesthesia Preprocedure Evaluation (Addendum)
Anesthesia Evaluation  Patient identified by MRN, date of birth, ID band Patient awake    Reviewed: Allergy & Precautions, NPO status , Patient's Chart, lab work & pertinent test results  Airway Mallampati: II  TM Distance: >3 FB Neck ROM: Full    Dental no notable dental hx. (+) Teeth Intact, Dental Advisory Given, Implants   Pulmonary neg pulmonary ROS, former smoker,    Pulmonary exam normal breath sounds clear to auscultation       Cardiovascular negative cardio ROS Normal cardiovascular exam Rhythm:Regular Rate:Normal     Neuro/Psych negative neurological ROS     GI/Hepatic Neg liver ROS, Metastatic rectal Ca   Endo/Other  negative endocrine ROS  Renal/GU K+ 4.0 Cr 0.81     Musculoskeletal negative musculoskeletal ROS (+)   Abdominal   Peds  Hematology  (+) anemia , Hgb 10.4 Plt 315   Anesthesia Other Findings   Reproductive/Obstetrics                            Anesthesia Physical  Anesthesia Plan  ASA: IV  Anesthesia Plan: MAC   Post-op Pain Management:    Induction: Intravenous  PONV Risk Score and Plan: Treatment may vary due to age or medical condition, Ondansetron and Dexamethasone  Airway Management Planned: Nasal Cannula  Additional Equipment: None  Intra-op Plan:   Post-operative Plan:   Informed Consent: I have reviewed the patients History and Physical, chart, labs and discussed the procedure including the risks, benefits and alternatives for the proposed anesthesia with the patient or authorized representative who has indicated his/her understanding and acceptance.     Dental advisory given  Plan Discussed with:   Anesthesia Plan Comments:         Anesthesia Quick Evaluation

## 2019-10-20 NOTE — H&P (Signed)
Chief Complaint: Endocarditis  Referring Physician(s): Truitt Merle  Supervising Physician: Daryll Brod  Patient Status: [facility] - In-pt  History of Present Illness: Billy Mendoza is a 69 y.o. male with rectal cancer metastasized to the liver.  He presented to [facility] ED with a fever.  COVID is negative.  ECHO done today showed = 7. MITRAL VALVE:  The mitral valve is abnormal and appears myxomatous with elongated leaflets and thickening. There is Moderate regurgitation with multiple jets. Several small mobile masses noted on the valve leaflets are consistent with endocarditis. The anterior leaflet appears partially flail and prolapses and end-systole. The valve was visualized with 2D/3D modes.  We are asked to remove his Port A Cath and place a PICC.  The Port was initially placed by Dr. Earleen Newport on 08/16/2018.   Past Medical History:  Diagnosis Date  . colorectal ca dx'd 07/2018  . Liver metastases ([place]) dx'd 07/2018  . Lung metastases ([place]) dx'd 07/2018    Past Surgical History:  Procedure Laterality Date  . BILIARY STENT PLACEMENT  10/05/2019   Procedure: BILIARY STENT PLACEMENT;  Surgeon: Carol Ada, MD;  Location: [facility] ENDOSCOPY;  Service: Endoscopy;;  . COLONIC STENT PLACEMENT N/A 01/19/2019   Procedure: COLONIC STENT PLACEMENT;  Surgeon: Carol Ada, MD;  Location: [facility] ENDOSCOPY;  Service: Endoscopy;  Laterality: N/A;  . ERCP N/A 10/05/2019   Procedure: ENDOSCOPIC RETROGRADE CHOLANGIOPANCREATOGRAPHY (ERCP);  Surgeon: Carol Ada, MD;  Location: [place];  Service: Endoscopy;  Laterality: N/A;  . ESOPHAGOGASTRODUODENOSCOPY (EGD) WITH PROPOFOL N/A 10/05/2019   Procedure: ESOPHAGOGASTRODUODENOSCOPY (EGD) WITH PROPOFOL;  Surgeon: Carol Ada, MD;  Location: [place];  Service: Endoscopy;  Laterality: N/A;  . EUS  10/05/2019   Procedure: UPPER ENDOSCOPIC ULTRASOUND (EUS) LINEAR;  Surgeon: Carol Ada, MD;  Location: [facility] ENDOSCOPY;  Service: Endoscopy;;  .  FLEXIBLE SIGMOIDOSCOPY N/A 01/19/2019   Procedure: Beryle Quant;  Surgeon: Carol Ada, MD;  Location: [facility] ENDOSCOPY;  Service: Endoscopy;  Laterality: N/A;  . IR IMAGING GUIDED PORT INSERTION  08/16/2018  . IR US GUIDE BX ASP/DRAIN  08/16/2018  . REMOVAL OF STONES  10/05/2019   Procedure: REMOVAL OF STONES;  Surgeon: Carol Ada, MD;  Location: [facility] ENDOSCOPY;  Service: Endoscopy;;  . Joan Mayans  10/05/2019   Procedure: Joan Mayans;  Surgeon: Carol Ada, MD;  Location: [facility] ENDOSCOPY;  Service: Endoscopy;;    Allergies: Patient has no known allergies.  Medications: Prior to Admission medications   Medication Sig Start Date End Date Taking? Authorizing Provider  acetaminophen (TYLENOL) 500 MG tablet Take 1,000 mg by mouth every 6 (six) hours as needed for moderate pain.   Yes [provider]  docusate sodium (COLACE) 100 MG capsule Take 100 mg by mouth 2 (two) times daily.   Yes [provider]  loperamide (IMODIUM A-D) 2 MG tablet Take 2 mg by mouth 4 (four) times daily as needed for diarrhea or loose stools.   Yes [provider]  magnesium oxide (MAGNESIUM-OXIDE) 400 (241.3 Mg) MG tablet Take 0.5 tablets (200 mg total) by mouth 2 (two) times daily. 07/24/19  Yes Truitt Merle, MD  mirtazapine (REMERON) 7.5 MG tablet Take 1 tablet (7.5 mg total) by mouth at bedtime. 10/13/19  Yes Truitt Merle, MD  morphine (MS CONTIN) 30 MG 12 hr tablet Take 1 tablet (30 mg total) by mouth every 12 (twelve) hours. 09/21/19  Yes Truitt Merle, MD  Multiple Vitamin (MULTIVITAMIN WITH MINERALS) TABS tablet Take 1 tablet by mouth daily.   Yes [provider]  oxyCODONE (OXY IR/ROXICODONE) 5 MG immediate release tablet Take 2 tablets (10 mg total) by mouth every 8 (eight) hours as needed for severe pain. Patient taking differently: Take 10 mg by mouth 2 (two) times daily.  10/11/19  Yes Truitt Merle, MD  potassium chloride SA (KLOR-CON) 20 MEQ tablet Take 1 tablet (20 mEq  total) by mouth daily. 09/26/19  Yes Truitt Merle, MD  prochlorperazine (COMPAZINE) 10 MG tablet Take 10 mg by mouth every 6 (six) hours as needed for nausea or vomiting.   Yes [provider]  rivaroxaban (XARELTO) 10 MG TABS tablet Take 10 mg by mouth daily.   Yes [provider]  cefTRIAXone (ROCEPHIN) IVPB Inject 2 g into the vein daily for 9 days. Indication:  Gram negative bacteremia Last Day of Therapy:  10/29/2019 Labs - Once weekly:  CBC/D and BMP 10/20/19 10/29/19  Truitt Merle, MD  diphenoxylate-atropine (LOMOTIL) 2.5-0.025 MG tablet Take 1-2 tablets by mouth 4 (four) times daily as needed for diarrhea or loose stools. Up to 8 tabs daily for severe diarrhea Patient not taking: Reported on 10/14/2019 09/07/18   Truitt Merle, MD  Rivaroxaban (XARELTO) 15 MG TABS tablet Take 1 tablet (15 mg total) by mouth 2 (two) times daily with a meal. Patient not taking: Reported on 10/14/2019 08/16/19   Truitt Merle, MD     Family History  Problem Relation Age of Onset  . Cancer Mother 59       breast cancer  . Atrial fibrillation Father   . Heart failure Father   . Cancer Maternal Grandmother        lung cancer     Social History   Socioeconomic History  . Marital status: Married    Spouse name: Not on file  . Number of children: Not on file  . Years of education: Not on file  . Highest education level: Not on file  Occupational History  . Not on file  Tobacco Use  . Smoking status: Former Smoker    Years: 10.00    Types: Cigarettes, Cigars  . Smokeless tobacco: Never Used  . Tobacco comment: quit in Sep 2019   Substance and Sexual Activity  . Alcohol use: Yes    Alcohol/week: 1.0 - 2.0 standard drinks    Types: 1 - 2 Shots of liquor per week  . Drug use: Never  . Sexual activity: Not on file  Other Topics Concern  . Not on file  Social History Narrative  . Not on file   Social Determinants of Health   Financial Resource Strain:   . Difficulty of Paying Living Expenses:  Not on file  Food Insecurity:   . Worried About Charity fundraiser in the Last Year: Not on file  . Ran Out of Food in the Last Year: Not on file  Transportation Needs:   . Lack of Transportation (Medical): Not on file  . Lack of Transportation (Non-Medical): Not on file  Physical Activity:   . Days of Exercise per Week: Not on file  . Minutes of Exercise per Session: Not on file  Stress:   . Feeling of Stress : Not on file  Social Connections:   . Frequency of Communication with Friends and Family: Not on file  . Frequency of Social Gatherings with Friends and Family: Not on file  . Attends Religious Services: Not on file  . Active Member of Clubs or Organizations: Not on file  . Attends Club or  Organization Meetings: Not on file  . Marital Status: Not on file     Review of Systems: A 12 point ROS discussed and pertinent positives are indicated in the HPI above.  All other systems are negative.  Review of Systems  Vital Signs: BP (!) 147/96   Pulse 81   Temp 98.1 F (36.7 C) (Temporal)   Resp 15   Wt 97.9 kg   SpO2 97%   BMI 29.27 kg/m   Physical Exam Vitals reviewed.  Constitutional:      Appearance: Normal appearance.  HENT:     Head: Normocephalic and atraumatic.  Eyes:     Extraocular Movements: Extraocular movements intact.  Cardiovascular:     Rate and Rhythm: Normal rate and regular rhythm.  Pulmonary:     Effort: Pulmonary effort is normal. No respiratory distress.     Breath sounds: Normal breath sounds.  Abdominal:     General: There is no distension.     Palpations: Abdomen is soft.     Tenderness: There is no abdominal tenderness.  Musculoskeletal:        General: Normal range of motion.     Cervical back: Normal range of motion.  Skin:    General: Skin is warm and dry.  Neurological:     General: No focal deficit present.     Mental Status: He is alert and oriented to person, place, and time.  Psychiatric:        Mood and Affect: Mood  normal.        Behavior: Behavior normal.        Thought Content: Thought content normal.        Judgment: Judgment normal.     Imaging: CT HEAD W & WO CONTRAST  Result Date: 10/19/2019 CLINICAL DATA:  Worsening confusion. EXAM: CT HEAD WITHOUT AND WITH CONTRAST TECHNIQUE: Contiguous axial images were obtained from the base of the skull through the vertex without and with intravenous contrast CONTRAST:  47mL OMNIPAQUE IOHEXOL 300 MG/ML  SOLN COMPARISON:  None. FINDINGS: Brain: Chronic atrophy of the brainstem and cerebellum. No acute finding in those regions. Cerebral hemispheres show atrophy accelerated for age. There is cortical and subcortical low-density at the left frontal vertex consistent with recent infarction. This area measures about 3.5 cm in diameter. Mild swelling but no evidence of hemorrhage or mass effect. This is presumed represent arterial infarction. Second focus of smaller suspected acute infarction affecting a right frontal vertex gyrus, also without hemorrhage or mass effect. No evidence of mass lesion, hemorrhage hydrocephalus or extra-axial collection. No abnormal contrast enhancement occurs. Vascular: There is atherosclerotic calcification of the major vessels at the base of the brain. Skull: Negative Sinuses/Orbits: Clear/normal Other: None IMPRESSION: 3.5 cm region of cortical and subcortical low density at the left frontal vertex consistent with acute infarction. No hemorrhage or mass effect. Question second smaller focus of cortical infarction in the right frontal vertex, also without hemorrhage. This distribution would suggest embolic disease from the heart or ascending aorta. Brain atrophy elsewhere as noted above. Electronically Signed   By: Nelson Chimes M.D.   On: 10/19/2019 10:39   CT Abdomen Pelvis W Contrast  Result Date: 10/14/2019 CLINICAL DATA:  69 year old male with metastatic rectal cancer presents with fever and fatigue. Concern for intra-abdominal infection.  EXAM: CT ABDOMEN AND PELVIS WITH CONTRAST TECHNIQUE: Multidetector CT imaging of the abdomen and pelvis was performed using the standard protocol following bolus administration of intravenous contrast. CONTRAST:  148mL OMNIPAQUE IOHEXOL  300 MG/ML  SOLN COMPARISON:  CT abdomen pelvis dated 08/30/2019. FINDINGS: Lower chest: Multiple pulmonary lesions noted primarily involving the visualized right lung and measuring up to 19 mm in the right lower lobe consistent with metastatic disease. These have increased in size since the prior CT. There is coronary vascular calcification. No intra-abdominal free air or free fluid. Hepatobiliary: Multiple hypodense lesions within the liver measuring up to 6.8 x 4.7 cm (previously 3.8 x 2.7 cm) in the dome of the liver most consistent with metastatic disease. Significant interval increase in the size of the liver metastasis since the prior CT. There is mild periportal edema versus dilatation of the intrahepatic biliary tree. High attenuating content gallbladder may represent sludge or small stones versus vicarious excretion of contrast. A biliary stent is in place. Pancreas: There is a 2 x 3 cm low attenuating lesion in the uncinate process of the pancreas which has increased in size since the prior CT. This may represent metastatic disease within the pancreas or within a peripancreatic lymph node. A primary pancreatic malignancy is not entirely excluded. No dilatation of the main pancreatic duct. No active inflammatory changes. Spleen: Normal in size without focal abnormality. Adrenals/Urinary Tract: The adrenal glands are unremarkable. There is no hydronephrosis on either side. There is symmetric enhancement and excretion of contrast by both kidneys. Multiple bilateral renal cysts measure up to 3 cm in the interpolar aspect of the right kidney. The visualized ureters and urinary bladder appear unremarkable. Stomach/Bowel: There is circumferential thickening of the wall of the  rectum measuring approximately 15 mm in thickness. There is haziness and stranding of the perirectal fat. Several small rounded nodular densities adjacent to the rectum measure up to 9 mm (series 2, image 91) and may represent mildly rounded lymph nodes. These findings are similar to prior CT. There is overall mild-to-moderate segmental narrowing and stricture of the rectal vault. There is large amount of stool in the distal colon proximal to this narrowed segment. No evidence of small-bowel obstruction. The appendix is normal. Vascular/Lymphatic: Mild aortoiliac atherosclerotic disease. No portal venous gas. Top-normal periportal lymph nodes measure up to 11 mm in short axis. Aortocaval hypodense lymph node measures 2.4 x 2.3 cm (coronal series 4, image 65) and increased since the prior CT (previously measuring 1.3 x 1.4 cm). Reproductive: The prostate and seminal vesicles are grossly unremarkable. Other: Small fat containing umbilical hernia. Musculoskeletal: No acute or significant osseous findings. IMPRESSION: 1. Interval progression of metastatic disease in the chest, abdomen and pelvis. 2. Circumferential thickening of the wall of the rectum with mild-to-moderate segmental narrowing and stricture of the rectal vault similar to prior CT. There is large amount of stool within the distal colon proximal to this narrowed segment. No evidence of small-bowel obstruction. Normal appendix. 3. Interval increase in the size of the low attenuating lesion in the uncinate process of the pancreas concerning for metastatic disease. A primary pancreatic malignancy is not entirely excluded. 4. Aortic Atherosclerosis (ICD10-I70.0). Electronically Signed   By: Anner Crete M.D.   On: 10/14/2019 22:27   US Abdomen Limited  Result Date: 10/02/2019 CLINICAL DATA:  Elevated liver enzymes.  History of rectal cancer. EXAM: ULTRASOUND ABDOMEN LIMITED RIGHT UPPER QUADRANT COMPARISON:  August 30, 2019. FINDINGS: Gallbladder:  Gallbladder is mildly distended. No gallstones or gallbladder wall thickening or pericholecystic fluid is noted. No sonographic Murphy's sign is noted. Common bile duct: Diameter: 7 mm calculus is noted in distal common bile duct. Distal common bile duct measures 8  mm which is mildly dilated. Liver: At least 2 masses are noted in the liver consistent with metastatic disease; the largest measures 3 cm. Portal vein is patent on color Doppler imaging with normal direction of blood flow towards the liver. Other: None. IMPRESSION: 7 mm calculus is noted in distal common bile duct. Distal common bile duct is mildly dilated at 8 mm. MRCP or ERCP is recommended for further evaluation. At least 2 hepatic masses are noted suggesting metastatic disease. Electronically Signed   By: Marijo Conception M.D.   On: 10/02/2019 13:22   DG Chest Port 1 View  Result Date: 10/14/2019 CLINICAL DATA:  69 year old male with fatigue and fever. EXAM: PORTABLE CHEST 1 VIEW COMPARISON:  CT of the chest abdomen pelvis dated 08/30/2019. FINDINGS: Right pectoral Port-A-Cath with tip at the cavoatrial junction. There is mild eventration of the right hemidiaphragm. No focal consolidation, pleural effusion, or pneumothorax. Top-normal cardiac silhouette. No acute osseous pathology. IMPRESSION: No active cardiopulmonary disease. Electronically Signed   By: Anner Crete M.D.   On: 10/14/2019 19:23   DG ERCP BILIARY & PANCREATIC DUCTS  Result Date: 10/05/2019 CLINICAL DATA:  ERCP.  Gallstones. EXAM: ERCP TECHNIQUE: Multiple spot images obtained with the fluoroscopic device and submitted for interpretation post-procedure. FLUOROSCOPY TIME:  Fluoroscopy Time:  7 minutes and 19 seconds Number of Acquired Spot Images: 11 COMPARISON:  Ultrasound dated 10/02/2019 FINDINGS: Initial images demonstrate cannulation of the common bile duct with injection of contrast. There is intrahepatic and extrahepatic biliary ductal dilatation. Contrast is seen  flowing into the gallbladder lumen subsequent images demonstrate a balloon sweep of the common bile duct with subsequent placement of a plastic biliary stent. IMPRESSION: ERCP as above. These images were submitted for radiologic interpretation only. Please see the procedural report for the amount of contrast and the fluoroscopy time utilized. Electronically Signed   By: Constance Holster M.D.   On: 10/05/2019 17:59    Labs:  CBC: Recent Labs    10/17/19 0508 10/18/19 0536 10/19/19 0444 10/20/19 0533  WBC 10.7* 10.3 11.0* 11.9*  HGB 8.0* 8.5* 7.5* 9.5*  HCT 26.2* 27.9* 25.2* 30.6*  PLT 130* 107* 102* 91*    COAGS: Recent Labs    01/18/19 2157 01/18/19 2157 01/19/19 0822 01/20/19 0005 01/20/19 0829 05/31/19 1856 10/14/19 1846 10/17/19 0508  INR 1.3*  --   --   --   --  1.2 2.2* 1.8*  APTT 39*   < > 147* 116* 112*  --  47*  --    < > = values in this interval not displayed.    BMP: Recent Labs    10/17/19 0508 10/18/19 0536 10/19/19 0444 10/20/19 0533  NA 135 135 136 138  K 3.9 3.5 3.6 3.7  CL 105 103 106 104  CO2 23 20* 22 23  GLUCOSE 95 119* 98 114*  BUN 9 11 14 13   CALCIUM 7.8* 8.2* 7.7* 7.9*  CREATININE 0.61 0.69 0.58* 0.55*  GFRNONAA >60 >60 >60 >60  GFRAA >60 >60 >60 >60    LIVER FUNCTION TESTS: Recent Labs    10/17/19 0508 10/18/19 0536 10/19/19 0444 10/20/19 0533  BILITOT 1.3* 1.6* 1.0 2.1*  AST 69* 74* 67* 73*  ALT 114* 94* 69* 65*  ALKPHOS 1,300* 1,417* 1,178* 1,163*  PROT 5.8* 6.3* 5.6* 5.7*  ALBUMIN 1.9* 2.0* 1.8* 1.9*    TUMOR MARKERS: No results for input(s): AFPTM, CEA, CA199, CHROMGRNA in the last 8760 hours.  Assessment and Plan:  Mitral  valve endocarditis.  Will remove his Port today and place a PICC so he can continue chemotherapy for his metastatic rectal cancer.  Risks and benefits of tunneled catheter removal and PICC placement were discussed with the patient including bleeding, infection, damage to adjacent structures,  malfunction of the catheter with need for additional procedures.  All of the patient's questions were answered, patient is agreeable to proceed. Consent signed and in chart.  Thank you for this interesting consult.  I greatly enjoyed meeting Billy Mendoza and look forward to participating in their care.  A copy of this report was sent to the requesting provider on this date.  Electronically Signed: Murrell Redden, PA-C   10/20/2019, 2:47 PM      I spent a total of  15 Minutes in face to face in clinical consultation, greater than 50% of which was counseling/coordinating care for [facility] removal and PICC placement.

## 2019-10-20 NOTE — H&P (Signed)
   INTERVAL PROCEDURE H&P  History and Physical Interval Note:  10/20/2019 10:17 AM  Billy Mendoza has presented today for their planned procedure. The various methods of treatment have been discussed with the patient and family. After consideration of risks, benefits and other options for treatment, the patient has consented to the procedure.  The patients' outpatient history has been reviewed, patient examined, and no change in status from most recent office note within the past 30 days. I have reviewed the patients' chart and labs and will proceed as planned. Questions were answered to the patient's satisfaction.   Pixie Casino, MD, [facility], [place] Director of the [facility] Lipid [facility] &  Cardiovascular Risk Reduction Clinic Diplomate of the American Board of Clinical Lipidology Attending Cardiologist  Direct Dial: [phone]  Fax: [phone]  Website:  [email or web address] Billy Mendoza 10/20/2019, 10:17 AM

## 2019-10-20 NOTE — Progress Notes (Signed)
SLP Cancellation Note  Patient Details Name: Dmetri Acri MRN: [id number] DOB: 1951-09-17   Cancelled treatment:       Reason Eval/Treat Not Completed: Patient at procedure or test/unavailable. Pt again At MRI and then to procedure. Discussed with RN who reports pt unlikely to have difficulty swallowing. Ok to defer eval and d/c home. If eval is still desired, SLP will be available tomorrow on 1/16.    Rodgers Likes, Katherene Ponto 10/20/2019, 2:41 PM

## 2019-10-20 NOTE — Progress Notes (Signed)
Brief Oncology Note:  TEE results noted. Discussed PAC removal with patient and wife. He would like to proceed. Will keep NPO for now. Xarelto remains on hold. Discussed need for PICC line placement for home antibiotic use. He is scheduled for MRI this afternoon. IR has been contacted for [facility] removal and PICC placement. Unsure if they can perform today. Message to Dr. Johnnye Sima regarding length of antibiotic treatment. SLP evaluation also pending.  The patient indicates that he is leaving today and does not want to stay overnight. Will try to expedite these procedures this afternoon, but uncertain if all of this can be completed. He indicates that he would like to cancel the SLP evaluation if this cannot be completed in time.   Mikey Bussing, DNP, AGPCNP-BC, AOCNP

## 2019-10-20 NOTE — Progress Notes (Signed)
INFECTIOUS DISEASE PROGRESS NOTE  ID: Billy Mendoza is a 69 y.o. male with  Active Problems:   Bacteremia due to Klebsiella pneumoniae  Subjective: Not in room.   Abtx:  Anti-infectives (From admission, onward)   Start     Dose/Rate Route Frequency Ordered Stop   10/20/19 0000  cefTRIAXone (ROCEPHIN) IVPB     2 g Intravenous Every 24 hours 10/19/19 1259 10/29/19 2359   10/18/19 1200  [MAR Hold]  cefTRIAXone (ROCEPHIN) 2 g in sodium chloride 0.9 % 100 mL IVPB     (MAR Hold since Fri 10/20/2019 at 1002.Hold Reason: Transfer to a Procedural area.)   2 g 200 mL/hr over 30 Minutes Intravenous Every 24 hours 10/18/19 1136 10/29/19 1159   10/16/19 1600  ceFEPIme (MAXIPIME) 2 g in sodium chloride 0.9 % 100 mL IVPB  Status:  Discontinued     2 g 200 mL/hr over 30 Minutes Intravenous Every 8 hours 10/16/19 1518 10/18/19 1136   10/16/19 1500  piperacillin-tazobactam (ZOSYN) IVPB 3.375 g  Status:  Discontinued     3.375 g 12.5 mL/hr over 240 Minutes Intravenous Every 8 hours 10/16/19 1404 10/16/19 1516      Medications:  Scheduled: . Chlorhexidine Gluconate Cloth  6 each Topical Daily  . docusate sodium  100 mg Oral BID  . feeding supplement (ENSURE ENLIVE)  237 mL Oral BID BM  . fentaNYL      . heparin lock flush      . lidocaine-EPINEPHrine      . magnesium oxide  200 mg Oral BID  . midazolam      . morphine  30 mg Oral Q12H  . multivitamin with minerals  1 tablet Oral Daily  . potassium chloride SA  20 mEq Oral Daily    Objective: Vital signs in last 24 hours: Temp:  [97.6 F (36.4 C)-99.5 F (37.5 C)] 97.9 F (36.6 C) (01/15 1005) Pulse Rate:  [81-87] 81 (01/15 1005) Resp:  [15-20] 20 (01/15 1005) BP: (112-145)/(75-94) 145/94 (01/15 1005) SpO2:  [95 %-97 %] 96 % (01/15 1005) Weight:  [97.9 kg] 97.9 kg (01/15 0533)   not in room.   Lab Results Recent Labs    10/19/19 0444 10/20/19 0533  WBC 11.0* 11.9*  HGB 7.5* 9.5*  HCT 25.2* 30.6*  NA 136 138  K 3.6 3.7    CL 106 104  CO2 22 23  BUN 14 13  CREATININE 0.58* 0.55*   Liver Panel Recent Labs    10/19/19 0444 10/20/19 0533  PROT 5.6* 5.7*  ALBUMIN 1.8* 1.9*  AST 67* 73*  ALT 69* 65*  ALKPHOS 1,178* 1,163*  BILITOT 1.0 2.1*   Sedimentation Rate No results for input(s): ESRSEDRATE in the last 72 hours. C-Reactive Protein No results for input(s): CRP in the last 72 hours.  Microbiology: Recent Results (from the past 240 hour(s))  Blood Culture (routine x 2)     Status: Abnormal   Collection Time: 10/14/19  6:46 PM   Specimen: BLOOD  Result Value Ref Range Status   Specimen Description   Final    BLOOD PORTA CATH Performed at [facility] [address] [place], [place] [address]    Special Requests   Final    BOTTLES DRAWN AEROBIC AND ANAEROBIC Blood Culture adequate volume Performed at [facility] [address] [place], [place] [address]    Culture  Setup Time   Final    GRAM NEGATIVE RODS IN BOTH AEROBIC  AND ANAEROBIC BOTTLES CRITICAL RESULT CALLED TO, READ BACK BY AND VERIFIED WITH: C. REED, RN (MCHP) AT 6203 ON 10/15/19 BY C. JESSUP, MT. Performed at [facility], [place] [address] [place], [place] [address]    Culture KLEBSIELLA PNEUMONIAE CITROBACTER FREUNDII  (A)  Final   Report Status 10/18/2019 FINAL  Final   Organism ID, Bacteria KLEBSIELLA PNEUMONIAE  Final   Organism ID, Bacteria CITROBACTER FREUNDII  Final      Susceptibility   Citrobacter freundii - MIC*    CEFAZOLIN >=64 RESISTANT Resistant     CEFEPIME <=0.12 SENSITIVE Sensitive     CEFTAZIDIME <=1 SENSITIVE Sensitive     CEFTRIAXONE <=0.25 SENSITIVE Sensitive     CIPROFLOXACIN <=0.25 SENSITIVE Sensitive     GENTAMICIN <=1 SENSITIVE Sensitive     IMIPENEM 1 SENSITIVE Sensitive     TRIMETH/SULFA <=20 SENSITIVE Sensitive     PIP/TAZO <=4 SENSITIVE Sensitive     * CITROBACTER FREUNDII   Klebsiella pneumoniae - MIC*    AMPICILLIN >=32 RESISTANT Resistant      CEFAZOLIN <=4 SENSITIVE Sensitive     CEFEPIME <=0.12 SENSITIVE Sensitive     CEFTAZIDIME <=1 SENSITIVE Sensitive     CEFTRIAXONE <=0.25 SENSITIVE Sensitive     CIPROFLOXACIN <=0.25 SENSITIVE Sensitive     GENTAMICIN <=1 SENSITIVE Sensitive     IMIPENEM <=0.25 SENSITIVE Sensitive     TRIMETH/SULFA <=20 SENSITIVE Sensitive     AMPICILLIN/SULBACTAM 4 SENSITIVE Sensitive     PIP/TAZO <=4 SENSITIVE Sensitive     * KLEBSIELLA PNEUMONIAE  Blood Culture ID Panel (Reflexed)     Status: Abnormal   Collection Time: 10/14/19  6:46 PM  Result Value Ref Range Status   Enterococcus species NOT DETECTED NOT DETECTED Final   Listeria monocytogenes NOT DETECTED NOT DETECTED Final   Staphylococcus species NOT DETECTED NOT DETECTED Final   Staphylococcus aureus (BCID) NOT DETECTED NOT DETECTED Final   Streptococcus species NOT DETECTED NOT DETECTED Final   Streptococcus agalactiae NOT DETECTED NOT DETECTED Final   Streptococcus pneumoniae NOT DETECTED NOT DETECTED Final   Streptococcus pyogenes NOT DETECTED NOT DETECTED Final   Acinetobacter baumannii NOT DETECTED NOT DETECTED Final   Enterobacteriaceae species DETECTED (A) NOT DETECTED Final    Comment: Enterobacteriaceae represent a large family of gram-negative bacteria, not a single organism. CRITICAL RESULT CALLED TO, READ BACK BY AND VERIFIED WITH: C. REED, RN (MCHP) AT 1638 ON 10/15/19 BY C. JESSUP, MT.    Enterobacter cloacae complex NOT DETECTED NOT DETECTED Final   Escherichia coli NOT DETECTED NOT DETECTED Final   Klebsiella oxytoca NOT DETECTED NOT DETECTED Final   Klebsiella pneumoniae DETECTED (A) NOT DETECTED Final    Comment: CRITICAL RESULT CALLED TO, READ BACK BY AND VERIFIED WITH: C. REED, RN (MCHP) AT 1733 ON 10/15/19 BY C. JESSUP, MT.    Proteus species NOT DETECTED NOT DETECTED Final   Serratia marcescens NOT DETECTED NOT DETECTED Final   Carbapenem resistance NOT DETECTED NOT DETECTED Final   Haemophilus influenzae NOT  DETECTED NOT DETECTED Final   Neisseria meningitidis NOT DETECTED NOT DETECTED Final   Pseudomonas aeruginosa NOT DETECTED NOT DETECTED Final   Candida albicans NOT DETECTED NOT DETECTED Final   Candida glabrata NOT DETECTED NOT DETECTED Final   Candida krusei NOT DETECTED NOT DETECTED Final   Candida parapsilosis NOT DETECTED NOT DETECTED Final   Candida tropicalis NOT DETECTED NOT DETECTED Final    Comment: Performed at [facility], [place]  [address], [place], [place] [address]  Respiratory Panel by RT PCR (Flu A&B, Covid) - Nasopharyngeal Swab     Status: None   Collection Time: 10/14/19  6:48 PM   Specimen: Nasopharyngeal Swab  Result Value Ref Range Status   SARS Coronavirus 2 by RT PCR NEGATIVE NEGATIVE Final    Comment: (NOTE) SARS-CoV-2 target nucleic acids are NOT DETECTED. The SARS-CoV-2 RNA is generally detectable in upper respiratoy specimens during the acute phase of infection. The lowest concentration of SARS-CoV-2 viral copies this assay can detect is 131 copies/mL. A negative result does not preclude SARS-Cov-2 infection and should not be used as the sole basis for treatment or other patient management decisions. A negative result may occur with  improper specimen collection/handling, submission of specimen other than nasopharyngeal swab, presence of viral mutation(s) within the areas targeted by this assay, and inadequate number of viral copies (<131 copies/mL). A negative result must be combined with clinical observations, patient history, and epidemiological information. The expected result is Negative. Fact Sheet for Patients:  PinkCheek.be Fact Sheet for Healthcare Providers:  GravelBags.it This test is not yet ap proved or cleared by the [place] FDA and  has been authorized for detection and/or diagnosis of SARS-CoV-2 by FDA under an Emergency Use Authorization (EUA). This EUA will remain  in  effect (meaning this test can be used) for the duration of the COVID-19 declaration under Section 564(b)(1) of the Act, 21 U.S.C. section 360bbb-3(b)(1), unless the authorization is terminated or revoked sooner.    Influenza A by PCR NEGATIVE NEGATIVE Final   Influenza B by PCR NEGATIVE NEGATIVE Final    Comment: (NOTE) The Xpert Xpress SARS-CoV-2/FLU/RSV assay is intended as an aid in  the diagnosis of influenza from Nasopharyngeal swab specimens and  should not be used as a sole basis for treatment. Nasal washings and  aspirates are unacceptable for Xpert Xpress SARS-CoV-2/FLU/RSV  testing. Fact Sheet for Patients: PinkCheek.be Fact Sheet for Healthcare Providers: GravelBags.it This test is not yet approved or cleared by the [place] FDA and  has been authorized for detection and/or diagnosis of SARS-CoV-2 by  FDA under an Emergency Use Authorization (EUA). This EUA will remain  in effect (meaning this test can be used) for the duration of the  Covid-19 declaration under Section 564(b)(1) of the Act, 21  U.S.C. section 360bbb-3(b)(1), unless the authorization is  terminated or revoked. Performed at [facility], [place] [address] [place] [address] [place], [place] [address]   Blood Culture (routine x 2)     Status: Abnormal   Collection Time: 10/14/19  6:51 PM   Specimen: BLOOD LEFT HAND  Result Value Ref Range Status   Specimen Description   Final    BLOOD LEFT HAND Performed at [facility] [address], [place], [place] [address]    Special Requests   Final    BOTTLES DRAWN AEROBIC AND ANAEROBIC Blood Culture adequate volume Performed at [facility] [address] [place], [place] [address]    Culture  Setup Time   Final    GRAM NEGATIVE RODS IN BOTH AEROBIC AND ANAEROBIC BOTTLES CRITICAL RESULT CALLED TO, READ BACK BY AND VERIFIED WITH: C. REED, RN (MCHP) AT 2585 ON  10/15/19 BY C. JESSUP, MT.    Culture (A)  Final    KLEBSIELLA PNEUMONIAE CITROBACTER FREUNDII SUSCEPTIBILITIES PERFORMED ON PREVIOUS CULTURE WITHIN THE LAST 5 DAYS. Performed at [facility], [place] [address] [place],  [address]  Report Status 10/18/2019 FINAL  Final  Urine culture     Status: None   Collection Time: 10/15/19  1:06 AM   Specimen: In/Out Cath Urine  Result Value Ref Range Status   Specimen Description   Final    IN/OUT CATH URINE Performed at [facility], [place] [address] [place], [place] [address]    Special Requests   Final    NONE Performed at [facility], [place] [address] [place], [place] [address]    Culture   Final    NO GROWTH Performed at [facility], [place] [address], [place], [place] [address]    Report Status 10/16/2019 FINAL  Final  Culture, Blood     Status: None (Preliminary result)   Collection Time: 10/16/19 12:50 PM   Specimen: BLOOD  Result Value Ref Range Status   Specimen Description BLOOD LEFT ANTECUBITAL  Final   Special Requests   Final    BOTTLES DRAWN AEROBIC AND ANAEROBIC Blood Culture adequate volume   Culture   Final    NO GROWTH 3 DAYS Performed at [facility], [place] [address], [place], [place] [address]    Report Status PENDING  Incomplete  Culture, Blood     Status: None (Preliminary result)   Collection Time: 10/16/19 12:55 PM   Specimen: BLOOD  Result Value Ref Range Status   Specimen Description BLOOD PORTA CATH  Final   Special Requests   Final    BOTTLES DRAWN AEROBIC AND ANAEROBIC Blood Culture adequate volume   Culture   Final    NO GROWTH 3 DAYS Performed at [facility], [address] [place], [place] [address]    Report Status PENDING  Incomplete  Culture, blood (routine x 2)     Status: None (Preliminary result)   Collection Time: 10/17/19  7:31 PM   Specimen: BLOOD  Result Value Ref Range Status   Specimen Description   Final    BLOOD  RIGHT ANTECUBITAL Performed at [facility] [address] [place], [place] [address]    Special Requests   Final    BOTTLES DRAWN AEROBIC AND ANAEROBIC Blood Culture adequate volume Performed at [facility] [address], [place], [place] [address]    Culture   Final    NO GROWTH 2 DAYS Performed at [facility] [address], [place], [place] [address]    Report Status PENDING  Incomplete  Culture, blood (routine x 2)     Status: None (Preliminary result)   Collection Time: 10/17/19  7:33 PM   Specimen: BLOOD  Result Value Ref Range Status   Specimen Description   Final    BLOOD LEFT ANTECUBITAL Performed at [facility] [address] [place], [place] [address]    Special Requests   Final    BOTTLES DRAWN AEROBIC AND ANAEROBIC Blood Culture adequate volume Performed at [facility] [address] [place], [place] [address]    Culture   Final    NO GROWTH 2 DAYS Performed at [facility] [address] [place], [place] [address]    Report Status PENDING  Incomplete  C difficile quick scan w PCR reflex     Status: None   Collection Time: 10/18/19 11:36 AM   Specimen: STOOL  Result Value Ref Range Status   C Diff antigen NEGATIVE NEGATIVE Final   C Diff toxin NEGATIVE NEGATIVE Final  C Diff interpretation No C. difficile detected.  Final    Comment: Performed at [facility], [place] [address], [place], [place] [address]    Studies/Results: CT HEAD W & WO CONTRAST  Result Date: 10/19/2019 CLINICAL DATA:  Worsening confusion. EXAM: CT HEAD WITHOUT AND WITH CONTRAST TECHNIQUE: Contiguous axial images were obtained from the base of the skull through the vertex without and with intravenous contrast CONTRAST:  76m OMNIPAQUE IOHEXOL 300 MG/ML  SOLN COMPARISON:  None. FINDINGS: Brain: Chronic atrophy of the brainstem and cerebellum. No acute finding in those  regions. Cerebral hemispheres show atrophy accelerated for age. There is cortical and subcortical low-density at the left frontal vertex consistent with recent infarction. This area measures about 3.5 cm in diameter. Mild swelling but no evidence of hemorrhage or mass effect. This is presumed represent arterial infarction. Second focus of smaller suspected acute infarction affecting a right frontal vertex gyrus, also without hemorrhage or mass effect. No evidence of mass lesion, hemorrhage hydrocephalus or extra-axial collection. No abnormal contrast enhancement occurs. Vascular: There is atherosclerotic calcification of the major vessels at the base of the brain. Skull: Negative Sinuses/Orbits: Clear/normal Other: None IMPRESSION: 3.5 cm region of cortical and subcortical low density at the left frontal vertex consistent with acute infarction. No hemorrhage or mass effect. Question second smaller focus of cortical infarction in the right frontal vertex, also without hemorrhage. This distribution would suggest embolic disease from the heart or ascending aorta. Brain atrophy elsewhere as noted above. Electronically Signed   By: MNelson ChimesM.D.   On: 10/19/2019 10:39     Assessment/Plan: Klebsiella bacteremia Embolic CVA Stage IVb rectal cancer (2019) Port in place Hepatitis, obstructive  Total days of antibiotics:4 ceftriaxone TEE today shows MV IE.  Port removed today.  Await repeat BCx (1-11, 1-12), so far ngtd.  Would continue his ceftriaxone for 42 days.  He will need PIC.  If pt, his PCP, daughter do not want long term IV rx due to his comorbidities, would favor completing 2 weeks and then 4 weeks of po levaquin 750 mg daily. Provided his repeat blood cultures remain negative.  Dr VTommy Medalavailable over weekend if needed.  Explained to daughter.   No Known Allergies  OPAT Orders Discharge antibiotics: Ceftriaxone 2g ivpb qday Duration: 42 days End Date: [facility]-27-19  [facility]  Protocol: please  Labs weekly while on IV antibiotics: __x CBC with differential __ BMP _x_ CMP __ CRP __ ESR __ Vancomycin trough __ CK  _x_ Please pull PIC at completion of IV antibiotics __ Please leave PIC in place until doctor has seen patient or been notified  Fax weekly labs to [phone] Clinic Follow Up Appt: Primary md             JBobby RumpfMD, FACP Infectious Diseases (pager) [phone] 10/20/2019, 10:26 AM  LOS: 4 days

## 2019-10-20 NOTE — Procedures (Signed)
Bacteremia  S/p port removal S/p RUE SL PICC  TIP SVCRA No comp Stable ebl min Full reports in pacs

## 2019-10-20 NOTE — Progress Notes (Signed)
Billy Mendoza   DOB:[birth year]   [id number]   [id number]  Oncology follow up note  Subjective: The patient is scheduled for TEE later this morning at [facility].  Remains mildly confused and answers most questions with "yeah."  He is oriented to person, place, and time.  He denies chest pain and shortness of breath.  Denies abdominal pain, nausea, vomiting.  He reports that he had a loose stool overnight and nursing reports that this was bloody.  Objective:  Vitals:   10/20/19 0206 10/20/19 0533  BP: 134/90 (!) 141/84  Pulse: 86 82  Resp: 16 16  Temp: 99.5 F (37.5 C) 99.2 F (37.3 C)  SpO2: 96% 95%    Body mass index is 29.27 kg/m.  Intake/Output Summary (Last 24 hours) at 10/20/2019 0845 Last data filed at 10/19/2019 2319 Gross per 24 hour  Intake 1021 ml  Output 1 ml  Net 1020 ml     Sclerae slightly icteric  Oropharynx clear  No peripheral adenopathy  Lungs clear -- no rales or rhonchi  Heart regular rate and rhythm  Abdomen benign  MSK no focal spinal tenderness, no peripheral edema  Neuro nonfocal, drowsy    CBG (last 3)  Recent Labs    10/18/19 0745 10/19/19 0741 10/20/19 0738  GLUCAP 94 97 114*     Labs:  Urine Studies No results for input(s): UHGB, CRYS in the last 72 hours.  Invalid input(s): UACOL, UAPR, USPG, UPH, UTP, UGL, [place], UBIL, UNIT, UROB, [place], UEPI, UWBC, Junie Panning [place], [place], [place]  Basic Metabolic Panel: Recent Labs  Lab 10/16/19 1250 10/16/19 1250 10/17/19 0508 10/17/19 0508 10/18/19 0536 10/18/19 0536 10/19/19 0444 10/20/19 0533  NA 137  --  135  --  135  --  136 138  K 3.6   < > 3.9   < > 3.5   < > 3.6 3.7  CL 104  --  105  --  103  --  106 104  CO2 22  --  23  --  20*  --  22 23  GLUCOSE 111*  --  95  --  119*  --  98 114*  BUN 14  --  9  --  11  --  14 13  CREATININE 0.64  --  0.61  --  0.69  --  0.58* 0.55*  CALCIUM 7.9*  --  7.8*  --  8.2*  --  7.7* 7.9*  MG  --   --  2.0  --   --   --   --   --    < > =  values in this interval not displayed.   GFR Estimated Creatinine Clearance: 110.1 mL/min (A) (by C-G formula based on SCr of 0.55 mg/dL (L)). Liver Function Tests: Recent Labs  Lab 10/16/19 1250 10/17/19 0508 10/18/19 0536 10/19/19 0444 10/20/19 0533  AST 83* 69* 74* 67* 73*  ALT 165* 114* 94* 69* 65*  ALKPHOS 1,701* 1,300* 1,417* 1,178* 1,163*  BILITOT 1.1 1.3* 1.6* 1.0 2.1*  PROT 6.3* 5.8* 6.3* 5.6* 5.7*  ALBUMIN 1.9* 1.9* 2.0* 1.8* 1.9*   No results for input(s): LIPASE, AMYLASE in the last 168 hours. No results for input(s): AMMONIA in the last 168 hours. Coagulation profile Recent Labs  Lab 10/14/19 1846 10/17/19 0508  INR 2.2* 1.8*    CBC: Recent Labs  Lab 10/16/19 1250 10/17/19 0508 10/18/19 0536 10/19/19 0444 10/20/19 0533  WBC 10.1 10.7* 10.3 11.0* 11.9*  NEUTROABS 8.2* 8.4*  8.1* 8.7* 9.5*  HGB 9.2* 8.0* 8.5* 7.5* 9.5*  HCT 29.1* 26.2* 27.9* 25.2* 30.6*  MCV 94.8 98.1 97.2 98.1 96.8  PLT 121* 130* 107* 102* 91*   Cardiac Enzymes: No results for input(s): CKTOTAL, CKMB, CKMBINDEX, TROPONINI in the last 168 hours. BNP: Invalid input(s): POCBNP CBG: Recent Labs  Lab 10/17/19 0808 10/18/19 0625 10/18/19 0745 10/19/19 0741 10/20/19 0738  GLUCAP 89 109* 94 97 114*   D-Dimer No results for input(s): DDIMER in the last 72 hours. Hgb A1c No results for input(s): HGBA1C in the last 72 hours. Lipid Profile No results for input(s): CHOL, HDL, LDLCALC, TRIG, CHOLHDL, LDLDIRECT in the last 72 hours. Thyroid function studies No results for input(s): TSH, T4TOTAL, T3FREE, THYROIDAB in the last 72 hours.  Invalid input(s): FREET3 Anemia work up No results for input(s): VITAMINB12, FOLATE, FERRITIN, TIBC, IRON, RETICCTPCT in the last 72 hours. Microbiology Recent Results (from the past 240 hour(s))  Blood Culture (routine x 2)     Status: Abnormal   Collection Time: 10/14/19  6:46 PM   Specimen: BLOOD  Result Value Ref Range Status   Specimen  Description   Final    BLOOD PORTA CATH Performed at [facility] [address], [place], [place] [address]    Special Requests   Final    BOTTLES DRAWN AEROBIC AND ANAEROBIC Blood Culture adequate volume Performed at [facility] [address] [place], [place] [address]    Culture  Setup Time   Final    GRAM NEGATIVE RODS IN BOTH AEROBIC AND ANAEROBIC BOTTLES CRITICAL RESULT CALLED TO, READ BACK BY AND VERIFIED WITH: C. REED, RN (MCHP) AT [id number] ON 10/15/19 BY C. JESSUP, MT. Performed at [facility], [place] [address], [place], [place] [address]    Culture KLEBSIELLA PNEUMONIAE CITROBACTER FREUNDII  (A)  Final   Report Status 10/18/2019 FINAL  Final   Organism ID, Bacteria KLEBSIELLA PNEUMONIAE  Final   Organism ID, Bacteria CITROBACTER FREUNDII  Final      Susceptibility   Citrobacter freundii - MIC*    CEFAZOLIN >=64 RESISTANT Resistant     CEFEPIME <=0.12 SENSITIVE Sensitive     CEFTAZIDIME <=1 SENSITIVE Sensitive     CEFTRIAXONE <=0.25 SENSITIVE Sensitive     CIPROFLOXACIN <=0.25 SENSITIVE Sensitive     GENTAMICIN <=1 SENSITIVE Sensitive     IMIPENEM 1 SENSITIVE Sensitive     TRIMETH/SULFA <=20 SENSITIVE Sensitive     PIP/TAZO <=4 SENSITIVE Sensitive     * CITROBACTER FREUNDII   Klebsiella pneumoniae - MIC*    AMPICILLIN >=32 RESISTANT Resistant     CEFAZOLIN <=4 SENSITIVE Sensitive     CEFEPIME <=0.12 SENSITIVE Sensitive     CEFTAZIDIME <=1 SENSITIVE Sensitive     CEFTRIAXONE <=0.25 SENSITIVE Sensitive     CIPROFLOXACIN <=0.25 SENSITIVE Sensitive     GENTAMICIN <=1 SENSITIVE Sensitive     IMIPENEM <=0.25 SENSITIVE Sensitive     TRIMETH/SULFA <=20 SENSITIVE Sensitive     AMPICILLIN/SULBACTAM 4 SENSITIVE Sensitive     PIP/TAZO <=4 SENSITIVE Sensitive     * KLEBSIELLA PNEUMONIAE  Blood Culture ID Panel (Reflexed)     Status: Abnormal   Collection Time: 10/14/19  6:46 PM  Result Value Ref Range Status   Enterococcus species  NOT DETECTED NOT DETECTED Final   Listeria monocytogenes NOT DETECTED NOT DETECTED Final   Staphylococcus species NOT DETECTED NOT DETECTED Final   Staphylococcus aureus (BCID) NOT DETECTED NOT DETECTED  Final   Streptococcus species NOT DETECTED NOT DETECTED Final   Streptococcus agalactiae NOT DETECTED NOT DETECTED Final   Streptococcus pneumoniae NOT DETECTED NOT DETECTED Final   Streptococcus pyogenes NOT DETECTED NOT DETECTED Final   Acinetobacter baumannii NOT DETECTED NOT DETECTED Final   Enterobacteriaceae species DETECTED (A) NOT DETECTED Final    Comment: Enterobacteriaceae represent a large family of gram-negative bacteria, not a single organism. CRITICAL RESULT CALLED TO, READ BACK BY AND VERIFIED WITH: C. REED, RN (MCHP) AT [id number] ON 10/15/19 BY C. JESSUP, MT.    Enterobacter cloacae complex NOT DETECTED NOT DETECTED Final   Escherichia coli NOT DETECTED NOT DETECTED Final   Klebsiella oxytoca NOT DETECTED NOT DETECTED Final   Klebsiella pneumoniae DETECTED (A) NOT DETECTED Final    Comment: CRITICAL RESULT CALLED TO, READ BACK BY AND VERIFIED WITH: C. REED, RN (MCHP) AT 1733 ON 10/15/19 BY C. JESSUP, MT.    Proteus species NOT DETECTED NOT DETECTED Final   Serratia marcescens NOT DETECTED NOT DETECTED Final   Carbapenem resistance NOT DETECTED NOT DETECTED Final   Haemophilus influenzae NOT DETECTED NOT DETECTED Final   Neisseria meningitidis NOT DETECTED NOT DETECTED Final   Pseudomonas aeruginosa NOT DETECTED NOT DETECTED Final   Candida albicans NOT DETECTED NOT DETECTED Final   Candida glabrata NOT DETECTED NOT DETECTED Final   Candida krusei NOT DETECTED NOT DETECTED Final   Candida parapsilosis NOT DETECTED NOT DETECTED Final   Candida tropicalis NOT DETECTED NOT DETECTED Final    Comment: Performed at [facility], [place] [address], [place], [place] [address]  Respiratory Panel by RT PCR (Flu A&B, Covid) - Nasopharyngeal Swab     Status: None   Collection Time:  10/14/19  6:48 PM   Specimen: Nasopharyngeal Swab  Result Value Ref Range Status   SARS Coronavirus 2 by RT PCR NEGATIVE NEGATIVE Final    Comment: (NOTE) SARS-CoV-2 target nucleic acids are NOT DETECTED. The SARS-CoV-2 RNA is generally detectable in upper respiratoy specimens during the acute phase of infection. The lowest concentration of SARS-CoV-2 viral copies this assay can detect is 131 copies/mL. A negative result does not preclude SARS-Cov-2 infection and should not be used as the sole basis for treatment or other patient management decisions. A negative result may occur with  improper specimen collection/handling, submission of specimen other than nasopharyngeal swab, presence of viral mutation(s) within the areas targeted by this assay, and inadequate number of viral copies (<131 copies/mL). A negative result must be combined with clinical observations, patient history, and epidemiological information. The expected result is Negative. Fact Sheet for Patients:  PinkCheek.be Fact Sheet for Healthcare Providers:  GravelBags.it This test is not yet ap proved or cleared by the [place] FDA and  has been authorized for detection and/or diagnosis of SARS-CoV-2 by FDA under an Emergency Use Authorization (EUA). This EUA will remain  in effect (meaning this test can be used) for the duration of the COVID-19 declaration under Section 564(b)(1) of the Act, 21 U.S.C. section 360bbb-3(b)(1), unless the authorization is terminated or revoked sooner.    Influenza A by PCR NEGATIVE NEGATIVE Final   Influenza B by PCR NEGATIVE NEGATIVE Final    Comment: (NOTE) The Xpert Xpress SARS-CoV-2/FLU/RSV assay is intended as an aid in  the diagnosis of influenza from Nasopharyngeal swab specimens and  should not be used as a sole basis for treatment. Nasal washings and  aspirates are unacceptable for Xpert Xpress SARS-CoV-2/FLU/RSV   testing. Fact Sheet  for Patients: PinkCheek.be Fact Sheet for Healthcare Providers: GravelBags.it This test is not yet approved or cleared by the [place] FDA and  has been authorized for detection and/or diagnosis of SARS-CoV-2 by  FDA under an Emergency Use Authorization (EUA). This EUA will remain  in effect (meaning this test can be used) for the duration of the  Covid-19 declaration under Section 564(b)(1) of the Act, 21  U.S.C. section 360bbb-3(b)(1), unless the authorization is  terminated or revoked. Performed at [facility], [place] [address] [place], [place] [address]   Blood Culture (routine x 2)     Status: Abnormal   Collection Time: 10/14/19  6:51 PM   Specimen: BLOOD LEFT HAND  Result Value Ref Range Status   Specimen Description   Final    BLOOD LEFT HAND Performed at [facility] [address], [place], [place] [address]    Special Requests   Final    BOTTLES DRAWN AEROBIC AND ANAEROBIC Blood Culture adequate volume Performed at [facility] [address] [place], [place] [address]    Culture  Setup Time   Final    GRAM NEGATIVE RODS IN BOTH AEROBIC AND ANAEROBIC BOTTLES CRITICAL RESULT CALLED TO, READ BACK BY AND VERIFIED WITH: C. REED, RN (MCHP) AT [id number] ON 10/15/19 BY C. JESSUP, MT.    Culture (A)  Final    KLEBSIELLA PNEUMONIAE CITROBACTER FREUNDII SUSCEPTIBILITIES PERFORMED ON PREVIOUS CULTURE WITHIN THE LAST 5 DAYS. Performed at [facility], [place] [address], [place], [place] [address]    Report Status 10/18/2019 FINAL  Final  Urine culture     Status: None   Collection Time: 10/15/19  1:06 AM   Specimen: In/Out Cath Urine  Result Value Ref Range Status   Specimen Description   Final    IN/OUT CATH URINE Performed at [facility] [address], [place], [place] [address]    Special Requests   Final     NONE Performed at [facility], [place] [address], [place], [place] [address]    Culture   Final    NO GROWTH Performed at [facility], [place] [address] [place], [place] [address]    Report Status 10/16/2019 FINAL  Final  Culture, Blood     Status: None (Preliminary result)   Collection Time: 10/16/19 12:50 PM   Specimen: BLOOD  Result Value Ref Range Status   Specimen Description BLOOD LEFT ANTECUBITAL  Final   Special Requests   Final    BOTTLES DRAWN AEROBIC AND ANAEROBIC Blood Culture adequate volume   Culture   Final    NO GROWTH 3 DAYS Performed at [facility], [place] [address], [place], [place] [address]    Report Status PENDING  Incomplete  Culture, Blood     Status: None (Preliminary result)   Collection Time: 10/16/19 12:55 PM   Specimen: BLOOD  Result Value Ref Range Status   Specimen Description BLOOD PORTA CATH  Final   Special Requests   Final    BOTTLES DRAWN AEROBIC AND ANAEROBIC Blood Culture adequate volume   Culture   Final    NO GROWTH 3 DAYS Performed at [facility], [address], [place], [place] [address]    Report Status PENDING  Incomplete  Culture, blood (routine x 2)     Status: None (Preliminary result)   Collection Time: 10/17/19  7:31 PM   Specimen: BLOOD  Result  Value Ref Range Status   Specimen Description   Final    BLOOD RIGHT ANTECUBITAL Performed at [facility] [address], [place], [place] [address]    Special Requests   Final    BOTTLES DRAWN AEROBIC AND ANAEROBIC Blood Culture adequate volume Performed at [facility] [address] [place], [place] [address]    Culture   Final    NO GROWTH 2 DAYS Performed at [facility] [address], [place], [place] [address]    Report Status PENDING  Incomplete  Culture, blood (routine x 2)     Status: None (Preliminary result)   Collection Time: 10/17/19  7:33 PM   Specimen: BLOOD  Result Value Ref  Range Status   Specimen Description   Final    BLOOD LEFT ANTECUBITAL Performed at [facility] [address] [place], [place] [address]    Special Requests   Final    BOTTLES DRAWN AEROBIC AND ANAEROBIC Blood Culture adequate volume Performed at [facility] [address], [place], [place] [address]    Culture   Final    NO GROWTH 2 DAYS Performed at [facility] [address] [place], [place] [address]    Report Status PENDING  Incomplete  C difficile quick scan w PCR reflex     Status: None   Collection Time: 10/18/19 11:36 AM   Specimen: STOOL  Result Value Ref Range Status   C Diff antigen NEGATIVE NEGATIVE Final   C Diff toxin NEGATIVE NEGATIVE Final   C Diff interpretation No C. difficile detected.  Final    Comment: Performed at [facility], [place] [address] [place], [place] [address]      Studies:  CT HEAD W & WO CONTRAST  Result Date: 10/19/2019 CLINICAL DATA:  Worsening confusion. EXAM: CT HEAD WITHOUT AND WITH CONTRAST TECHNIQUE: Contiguous axial images were obtained from the base of the skull through the vertex without and with intravenous contrast CONTRAST:  60mL OMNIPAQUE IOHEXOL 300 MG/ML  SOLN COMPARISON:  None. FINDINGS: Brain: Chronic atrophy of the brainstem and cerebellum. No acute finding in those regions. Cerebral hemispheres show atrophy accelerated for age. There is cortical and subcortical low-density at the left frontal vertex consistent with recent infarction. This area measures about 3.5 cm in diameter. Mild swelling but no evidence of hemorrhage or mass effect. This is presumed represent arterial infarction. Second focus of smaller suspected acute infarction affecting a right frontal vertex gyrus, also without hemorrhage or mass effect. No evidence of mass lesion, hemorrhage hydrocephalus or extra-axial collection. No abnormal contrast enhancement occurs. Vascular: There is  atherosclerotic calcification of the major vessels at the base of the brain. Skull: Negative Sinuses/Orbits: Clear/normal Other: None IMPRESSION: 3.5 cm region of cortical and subcortical low density at the left frontal vertex consistent with acute infarction. No hemorrhage or mass effect. Question second smaller focus of cortical infarction in the right frontal vertex, also without hemorrhage. This distribution would suggest embolic disease from the heart or ascending aorta. Brain atrophy elsewhere as noted above. Electronically Signed   By: Nelson Chimes M.D.   On: 10/19/2019 10:39    Assessment: 69 y.o.  1.  Bacteremia secondary to Klebsiella pneumoniae 2.  Rectal cancer metastasized to the liver 3.  History of pulmonary emboli 4.  Anemia of chronic disease and GI bleed 5.  Abnormal LFTs secondary to liver metastasis and recent infection, improving 6.  Mild confusion, mild encephalopathy, mild expressive aphasia secondary to acute CVA 7.  CODE STATUS: DNR/DNI  Plan:  -Appreciate input from neurology and infectious disease.  The patient will have a TEE later this morning and is also scheduled to have an MRI of the brain with and without contrast, MRA of the head, and MRI of the neck.  If TEE is negative, will hold off on removing Port-A-Cath given his poor prognosis.  If TEE is positive, will decide on removing Port-A-Cath.  If we remove Port-A-Cath, he will need PICC line placement for IV antibiotics. -Continue IV ceftriaxone to complete a 14-day course.  Blood cultures remain negative and he remains afebrile. -We will continue to hold Xarelto for now pending further work-up with TEE and MRI/MRA.  He is still having GI bleeding. -Hemoglobin has improved following transfusion.  We will hold off on further transfusion at this time. -Await speech therapy evaluation. -The patient is a DNR/DNI.  We have had ongoing discussions regarding hospice.  We will continue discussions.  Mikey Bussing,  NP 10/20/2019  8:45 AM

## 2019-10-20 NOTE — Discharge Summary (Addendum)
Discharge Summary  Patient ID: Billy Mendoza MRN: [id number] DOB/AGE: 1951-01-11 69 y.o.  Admit date: 10/16/2019 Discharge date: 10/20/2019  Discharge Diagnoses:  Active Problems:   Bacteremia due to Klebsiella pneumoniae  Discharged Condition: fair  Discharge Labs:   CBC    Component Value Date/Time   WBC 11.9 (H) 10/20/2019 0533   RBC 3.16 (L) 10/20/2019 0533   HGB 9.5 (L) 10/20/2019 0533   HGB 9.2 (L) 10/16/2019 1250   HCT 30.6 (L) 10/20/2019 0533   PLT 91 (L) 10/20/2019 0533   PLT 121 (L) 10/16/2019 1250   MCV 96.8 10/20/2019 0533   MCH 30.1 10/20/2019 0533   MCHC 31.0 10/20/2019 0533   RDW 17.4 (H) 10/20/2019 0533   LYMPHSABS 0.9 10/20/2019 0533   MONOABS 1.1 (H) 10/20/2019 0533   EOSABS 0.2 10/20/2019 0533   BASOSABS 0.0 10/20/2019 0533   CMP Latest Ref Rng & Units 10/20/2019 10/19/2019 10/18/2019  Glucose 70 - 99 mg/dL 114(H) 98 119(H)  BUN 8 - 23 mg/dL 13 14 11   Creatinine 0.61 - 1.24 mg/dL 0.55(L) 0.58(L) 0.69  Sodium 135 - 145 mmol/L 138 136 135  Potassium 3.5 - 5.1 mmol/L 3.7 3.6 3.5  Chloride 98 - 111 mmol/L 104 106 103  CO2 22 - 32 mmol/L 23 22 20(L)  Calcium 8.9 - 10.3 mg/dL 7.9(L) 7.7(L) 8.2(L)  Total Protein 6.5 - 8.1 g/dL 5.7(L) 5.6(L) 6.3(L)  Total Bilirubin 0.3 - 1.2 mg/dL 2.1(H) 1.0 1.6(H)  Alkaline Phos 38 - 126 U/L 1,163(H) 1,178(H) 1,417(H)  AST 15 - 41 U/L 73(H) 67(H) 74(H)  ALT 0 - 44 U/L 65(H) 69(H) 94(H)    Significant Diagnostic Studies:  1.  CT of the head with and without contrast performed on 10/19/2019 showed a 3.5 cm region of cortical subcortical low-density of the left frontal vertex consistent with acute infarction. 2.  An MRI of the brain with and without contrast and MRA of the head and neck without contrast have been ordered and are pending at the time of admission.  Consults: ID and neurology  Procedures:  1.  He had a transesophageal echocardiogram performed on 10/20/2019-findings consistent with mitral valve endocarditis 2.   Port-A-Cath removal and PICC line placement by interventional radiology on 10/20/2019.  Disposition: Discharge disposition: 01-Home or Self Care        Allergies as of 10/20/2019   No Known Allergies     Medication List    STOP taking these medications   docusate sodium 100 MG capsule Commonly known as: COLACE     TAKE these medications   acetaminophen 500 MG tablet Commonly known as: TYLENOL Take 1,000 mg by mouth every 6 (six) hours as needed for moderate pain.   cefTRIAXone  IVPB Commonly known as: ROCEPHIN Inject 2 g into the vein daily for 9 days. Indication:  Gram negative bacteremia Last Day of Therapy:  10/29/2019 Labs - Once weekly:  CBC/D and BMP   cefTRIAXone 2 g in sodium chloride 0.9 % 100 mL Inject 2 g into the vein daily.   diphenoxylate-atropine 2.5-0.025 MG tablet Commonly known as: LOMOTIL Take 1-2 tablets by mouth 4 (four) times daily as needed for diarrhea or loose stools. Up to 8 tabs daily for severe diarrhea   loperamide 2 MG tablet Commonly known as: IMODIUM A-D Take 2 mg by mouth 4 (four) times daily as needed for diarrhea or loose stools.   magnesium oxide 400 (241.3 Mg) MG tablet Commonly known as: MAGnesium-Oxide Take 0.5 tablets (200 mg  total) by mouth 2 (two) times daily.   mirtazapine 7.5 MG tablet Commonly known as: REMERON Take 1 tablet (7.5 mg total) by mouth at bedtime.   morphine 30 MG 12 hr tablet Commonly known as: MS CONTIN Take 1 tablet (30 mg total) by mouth every 12 (twelve) hours.   multivitamin with minerals Tabs tablet Take 1 tablet by mouth daily.   oxyCODONE 5 MG immediate release tablet Commonly known as: Oxy IR/ROXICODONE Take 2 tablets (10 mg total) by mouth every 8 (eight) hours as needed for severe pain. What changed: when to take this   potassium chloride SA 20 MEQ tablet Commonly known as: KLOR-CON Take 1 tablet (20 mEq total) by mouth daily.   prochlorperazine 10 MG tablet Commonly known as:  COMPAZINE Take 10 mg by mouth every 6 (six) hours as needed for nausea or vomiting.   rivaroxaban 10 MG Tabs tablet Commonly known as: XARELTO Take 1 tablet (10 mg total) by mouth daily. What changed: Another medication with the same name was removed. Continue taking this medication, and follow the directions you see here.            Home Infusion Instuctions  (From admission, onward)         Start     Ordered   10/19/19 0000  Home infusion instructions    Question:  Instructions  Answer:  Flushing of vascular access device: 0.9% NaCl pre/post medication administration and prn patency; Heparin 100 u/ml, 37ml for implanted ports and Heparin 10u/ml, 90ml for all other central venous catheters.   10/19/19 [place]  (From admission, onward)         Start     Ordered   10/20/19 1555  For home use only DME Walker rolling  Once    Question Answer Comment  Walker: With Lockhart Wheels   Patient needs a walker to treat with the following condition Rectal cancer ([place])      10/20/19 1555   10/20/19 1555  For home use only DME 3 n 1  Once     10/20/19 1555           HPI:  Billy Mendoza is a 69 year old male with a history of a metastatic rectal cancer with liver metastasis who is managed by Billy Mendoza. He was most recently seen in the emergency room at [facility] on 10/14/2019 after having had a fever on Saturday morning of 100.6. He was given Tylenol at home with improvement of his fever to 99. His fever increased to 102 on Saturday afternoon at which time he was taken to the emergency room. A urine returned with no evidence of a UTI. Blood cultures x2 were collected. He was given a dose of Zosyn while in the ER. His blood cultures returned showing gram-negative rods which were identified as Klebsiella pneumonia. A COVID-19 screening test was completed which returned negative at that time. The patient was contacted and brought into the cancer  center today for admission. He is currently afebrile. He was last seen by Dr. Burr Medico on 10/13/2019. He presented that day for treatment and in follow-up of a recent stent placement. He was less jaundiced. Since then his jaundice. He continues to have dehydration despite drinking more water. His right upper quadrant pain had resolved after having a stent placed. He reported ongoing fatigue and anorexia. He was having more difficulty performing activities of daily living and  walking upstairs due to his fatigue. At that time he reported having an elevated temperature and drenching night sweats. He denied chills or cough. He presents today with continued anorexia, fatigue, decreased endurance, and weight loss. He denies constipation, diarrhea, nausea, or vomiting. He does report that his urine is dark in color. He has had no claylike stools.   Hospital Course:  The patient was admitted to the hospital for IV antibiotics due to Klebsiella pneumoniae bacteremia.  And infectious disease consult was requested who recommended starting the patient on cefepime and repeating blood cultures next day.  Repeat blood cultures drawn on both 10/16/2019 and 10/17/2019 have been negative to date.  Based on sensitivities, the patient was switched to ceftriaxone which he will continue as an outpatient. The patient remained afebrile during his hospitalization.  The patient developed bloody diarrhea during the hospitalization.  A stool for C. difficile was negative.  He was started on Imodium with improvement of his diarrhea.  Xarelto was placed on hold.  He was found to be more anemic and was given 2 units PRBCs on 10/19/2019 with improvement of his hemoglobin.  The patient was also more sedated and this was initially thought to be related to Ambien.  The Ambien was stopped, but the patient had ongoing mild confusion, difficulty with word finding, and somnolence.  A CT of the head with and without contrast was performed which showed acute  CVA.  Neurology was consulted and they recommended an MRI of the brain and MRI of the head neck, TEE, and speech therapy evaluation.  The TEE showed mitral valve endocarditis.  Based on these results, the decision was made to remove his Port-A-Cath and to place a PICC line for home IV antibiotics.  The MRI of the brain and MRI of the head and neck are pending at the time of this dictation.  SLP evaluation has been attempted x2 on the day of discharge but due to his n.p.o. status and multiple procedures, this was not performed.  The patient has indicated that he is unwilling to stay in the hospital for the speech-language pathology evaluation.    The patient will continue on IV ceftriaxone for the bacteremia.  Currently, this is planned for at least 14 days.  However, we have reached out to ID regarding the new finding of endocarditis to determine appropriate length of therapy and we may need to adjust this based on their recommendations.  We will also need to follow-up on the MRI of the brain and MRA of the head neck when they become available to [place].  Based on the patient's poor prognosis, he will likely be going to hospice once he completes his course of IV antibiotics.  He is a DNR/DNI.  Discharge Instructions    Diet general   Complete by: As directed    Home infusion instructions   Complete by: As directed    Instructions: Flushing of vascular access device: 0.9% NaCl pre/post medication administration and prn patency; Heparin 100 u/ml, 2ml for implanted ports and Heparin 10u/ml, 49ml for all other central venous catheters.   Increase activity slowly   Complete by: As directed       Signed: Mikey Bussing 10/20/2019, 3:58 PM   Addendum  I have seen the patient, examined him. I agree with the assessment and and plan and have edited the notes.   I reviewed his TEE, and brain MRI findings with pt and his wife in detail.  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  Patient wants to go home today, and declined speech evaluation.  We called social worker, and arranged bedside commode, and a walker for him.  Per patient's wife's request, I also wrote a letter for him before discharge.  I have discussed with Dr. Johnnye Sima, will give iv ceftriaxone for 6 weeks, for his endocarditis, or as long as he wants to.  I had a frank discussion with patient and his wife, due to the multifocal stroke, his life expectancy may be less than 6 weeks.  If things deteriorate, especially his neurological functions, I encouraged him to consider hospice.  They voiced good understanding.  I plan to set up a virtual visit appointment next week for f/u, his wife knows to call me if anything needed.  I spent a total of 60 mins for his discharge and coordinate his care today.   Truitt Merle  10/20/2019

## 2019-10-20 NOTE — Progress Notes (Signed)
  Echocardiogram 2D Echocardiogram and Echocardiogram Transesophageal has been performed.  Billy Mendoza 10/20/2019, 11:26 AM

## 2019-10-20 NOTE — Progress Notes (Signed)
SLP Cancellation Note  Patient Details Name: Billy Mendoza MRN: [id number] DOB: 06-Dec-1950   Cancelled treatment:       Reason Eval/Treat Not Completed: Other (comment). Pt is NPO for TEE. I will be at [facility] later this am until 2:30 pm. If pt is able to attempt POs, can come today for bedside swallow eval. Please feel free to secure chat me to let me know if pt is ready for assessment. Thanks  [name], MA [place]  Acute Rehabilitation Services Pager [phone] Office [phone]   [name] 10/20/2019, 8:47 AM

## 2019-10-20 NOTE — Progress Notes (Signed)
PT Cancellation Note  Patient Details Name: Billy Mendoza MRN: [id number] DOB: 28-Dec-1950   Cancelled Treatment:    Reason Eval/Treat Not Completed: Patient at procedure or test/unavailable Pt at TEE at [facility] then MRI.  Will f/u as able. Maggie Font, PT Acute Rehab Services Pager [phone] [facility] [place] [facility] 10/20/2019, 3:38 PM

## 2019-10-20 NOTE — Progress Notes (Signed)
Verbal report given to [facility] primary nurse for the patient.The patient was recovered in IR after which will be going to MRI. Vitals remain stable and there is no acute distress.

## 2019-10-20 NOTE — TOC Transition Note (Signed)
Transition of Care [facility]) - CM/SW Discharge Note   Patient Details  Name: Gurfateh Fagerberg MRN: [id number] Date of Birth: [birth year]  Transition of Care [facility]) CM/SW Contact:  Trish Mage, LCSW Phone Number: 10/20/2019, 4:22 PM   Clinical Narrative:  Pt to discharge today.  Called Zach at [facility] who will deliver RW and 3 in 1.  No further needs. TOC sign off.     Final next level of care: [place] Barriers to Discharge: No Barriers Identified   Patient Goals and CMS Choice        Discharge Placement                       Discharge Plan and Services                                     Social Determinants of Health (SDOH) Interventions     Readmission Risk Interventions Readmission Risk Prevention Plan 10/18/2019  Transportation Screening Complete  Medication Review ([place]) Complete  PCP or Specialist appointment within 3-5 days of discharge Complete  HRI or [place] Complete  [place] Not Applicable

## 2019-10-20 NOTE — CV Procedure (Signed)
TRANSESOPHAGEAL ECHOCARDIOGRAM (TEE) NOTE  INDICATIONS: infective endocarditis  PROCEDURE:   Informed consent was obtained prior to the procedure. The risks, benefits and alternatives for the procedure were discussed and the patient comprehended these risks.  Risks include, but are not limited to, cough, sore throat, vomiting, nausea, somnolence, esophageal and stomach trauma or perforation, bleeding, low blood pressure, aspiration, pneumonia, infection, trauma to the teeth and death.    After a procedural time-out, the patient was given propofol per anesthesia for sedation.  The patient's heart rate, blood pressure, and oxygen saturation are monitored continuously during the procedure.The oropharynx was anesthetized with topical cetacaine.  The transesophageal probe was inserted in the esophagus and stomach without difficulty and multiple views were obtained.  The patient was kept under observation until the patient left the procedure room.  I was present face-to-face 100% of this time. The patient left the procedure room in stable condition.   Agitated microbubble saline contrast was administered.  COMPLICATIONS:    There were no immediate complications.  Findings:  1. LEFT VENTRICLE: The left ventricular wall thickness is normal.  The left ventricular cavity is normal in size. Wall motion is normal.  LVEF is 55-60%.  2. RIGHT VENTRICLE:  The right ventricle is normal in structure and function without any thrombus or masses.    3. LEFT ATRIUM:  The left atrium is mildly increased in size without any thrombus or masses.  There is not spontaneous echo contrast ("smoke") in the left atrium consistent with a low flow state.  4. LEFT ATRIAL APPENDAGE:  The left atrial appendage is free of any thrombus or masses. The appendage has single lobes. Pulse doppler indicates moderate flow in the appendage.  5. ATRIAL SEPTUM:  The atrial septum is aneurysmal. There is no interatrial shunting by  color doppler and saline microbubble, however, a late right to left shunt was noted, consistent with small intrapulmonary or intrahepatic shunt.  6. RIGHT ATRIUM:  The right atrium is normal in size and function without any thrombus or masses. Prominent eustachian valve.  7. MITRAL VALVE:  The mitral valve is abnormal and appears myxomatous with elongated leaflets and thickening. There is Moderate regurgitation with multiple jets. Several small mobile masses noted on the valve leaflets are consistent with endocarditis. The anterior leaflet appears partially flail and prolapses and end-systole. The valve was visualized with 2D/3D modes.  8. AORTIC VALVE:  The aortic valve is trileaflet, normal in structure and function with no regurgitation.  There were no vegetations or stenosis  9. TRICUSPID VALVE:  The tricuspid valve also appears myxomatous with thickened and prolapsing leaflets. There was only trivial regurgitation, however.  There were no definitive vegetations or stenosis  10.  PULMONIC VALVE:  The pulmonic valve is normal in structure and function with trivial eccentric regurgitation.  There were no vegetations or stenosis.   11. AORTIC ARCH, ASCENDING AND DESCENDING AORTA:  There was grade 1 Ron Parker et. Al, 1992) atherosclerosis of the ascending aorta, aortic arch, or proximal descending aorta.  12. PULMONARY VEINS: Anomalous pulmonary venous return was not noted.  13. PERICARDIUM: The pericardium appeared normal and non-thickened.  There is no pericardial effusion.  IMPRESSION:   1. Findings consistent with mitral valve endocarditis. New changes compared to prior study in 05/2019. There is now moderate MR with AMVL prolapse and partially flail cord.  2. No LAA thrombus 3. Aneurysmal IAS - however, no interatrial shunt. A small late intrapulmonary or intrahepatic right to left shunt was detected  by saline microbubble contrast. 4. The tricuspid valve appears myxomatous and prolapses -  there is trivial TR and no endocarditis. 5. Prominent eustachian valve 6. Mild LAE 7. LVEF 55-60%  RECOMMENDATIONS:    1. Findings consistent with MV endocarditis - there has been partial cord rupture of the subvalvular apparatus to the AMVL with resultant prolapse and at least moderate MR which is a new finding compared to the prior echo in 05/2019.  2. Would repeat TTE after treatment for endocarditis to re-evaluate the mitral valve and determine if CT surgical evaluation is necessary.  Time Spent Directly with the Patient:  60 minutes   Pixie Casino, MD, [facility] [place], [place] Director of the Advanced Lipid Disorders &  Cardiovascular Risk Reduction Clinic Diplomate of the American Board of Clinical Lipidology Attending Cardiologist  Direct Dial: [phone]  Fax: [phone]  Website:  [email or web address] Aryianna Earwood 10/20/2019, 11:03 AM

## 2019-10-20 NOTE — Transfer of Care (Signed)
Immediate Anesthesia Transfer of Care Note  Patient: Billy Mendoza  Procedure(s) Performed: TRANSESOPHAGEAL ECHOCARDIOGRAM (TEE) (N/A ) BUBBLE STUDY  Patient Location: Endoscopy Unit  Anesthesia Type:MAC  Level of Consciousness: awake, oriented and patient cooperative  Airway & Oxygen Therapy: Patient Spontanous Breathing and Patient connected to nasal cannula oxygen  Post-op Assessment: Report given to RN and Post -op Vital signs reviewed and stable  Post vital signs: Reviewed  Last Vitals:  Vitals Value Taken Time  BP 147/96 10/20/19 1147  Temp 36.7 C 10/20/19 1110  Pulse 79 10/20/19 1202  Resp 19 10/20/19 1202  SpO2 97 % 10/20/19 1202  Vitals shown include unvalidated device data.  Last Pain:  Vitals:   10/20/19 1110  TempSrc: Temporal  PainSc: 0-No pain         Complications: No apparent anesthesia complications

## 2019-10-21 LAB — CULTURE, BLOOD (SINGLE)
Culture: NO GROWTH
Culture: NO GROWTH
Special Requests: ADEQUATE
Special Requests: ADEQUATE

## 2019-10-22 ENCOUNTER — Other Ambulatory Visit: Payer: Self-pay | Admitting: Hematology

## 2019-10-22 LAB — CULTURE, BLOOD (ROUTINE X 2)
Culture: NO GROWTH
Special Requests: ADEQUATE
Special Requests: ADEQUATE

## 2019-10-24 ENCOUNTER — Telehealth: Payer: Self-pay | Admitting: Hematology

## 2019-10-24 NOTE — Telephone Encounter (Signed)
Scheduled appt per 11/9 sch message  - unable to reach pt . Left message with appt date and time .  

## 2019-10-25 ENCOUNTER — Telehealth (HOSPITAL_BASED_OUTPATIENT_CLINIC_OR_DEPARTMENT_OTHER): Payer: BC Managed Care – PPO | Admitting: Hematology

## 2019-10-25 ENCOUNTER — Other Ambulatory Visit: Payer: Self-pay

## 2019-10-25 ENCOUNTER — Encounter: Payer: Self-pay | Admitting: Hematology

## 2019-10-25 ENCOUNTER — Telehealth: Payer: Self-pay

## 2019-10-25 DIAGNOSIS — C787 Secondary malignant neoplasm of liver and intrahepatic bile duct: Secondary | ICD-10-CM | POA: Diagnosis not present

## 2019-10-25 DIAGNOSIS — C20 Malignant neoplasm of rectum: Secondary | ICD-10-CM

## 2019-10-25 DIAGNOSIS — R531 Weakness: Secondary | ICD-10-CM

## 2019-10-25 MED ORDER — OXYCODONE HCL 5 MG PO TABS: 5.0000 mg | ORAL_TABLET | ORAL | 0 refills | Status: AC | PRN

## 2019-10-25 MED ORDER — DRONABINOL 2.5 MG PO CAPS: 2.5000 mg | ORAL_CAPSULE | Freq: Two times a day (BID) | ORAL | 0 refills | Status: AC

## 2019-10-25 NOTE — Telephone Encounter (Signed)
Verbal order given for injections caps.

## 2019-10-25 NOTE — Telephone Encounter (Signed)
Billy Mendoza called requesting refills for Billy contin and oxycodone

## 2019-10-25 NOTE — Progress Notes (Signed)
[name]   [phone] [phone]   Clinic Follow up Note   Patient Care Team: Merry Proud, MD as PCP - General (Internal Medicine) Truitt Merle, MD as Consulting Physician (Hematology) Carol Ada, MD as Consulting Physician (Gastroenterology)   I connected with Billy Mendoza on 10/25/2019 at  3:30 PM EST by video enabled telemedicine visit and verified that I am speaking with the correct person using two identifiers.  I discussed the limitations, risks, security and privacy concerns of performing an evaluation and management service by telephone and the availability of in person appointments. I also discussed with the patient that there may be a patient responsible charge related to this service. The patient expressed understanding and agreed to proceed.   Other persons participating in the visit and their role in the encounter:  His Wife  Patient's location:  His home  Provider's location:  My Office  CHIEF COMPLAINT: F/u of rectal cancer metastasized to liver  SUMMARY OF ONCOLOGIC HISTORY: Oncology History Overview Note  Cancer Staging Rectal cancer metastasized to liver [facility]) Staging form: Colon and Rectum, AJCC 8th Edition - Clinical stage from 07/28/2018: Stage IVB (cTX, cNX, pM1b) - Signed by Truitt Merle, MD on 08/18/2018     Rectal cancer metastasized to liver ([name])  07/28/2018 Initial Biopsy   Biopsy 07/28/18 Final Microscopic Diagnosis A.Colon, Ascending, Polyp:  -Fragments of tubular adenoma -No high-grade dysplasia of malignancy.  B. Rectum, Mass, Polyp:  -Invasive adenocarcinoma, moderately - poorly differentiatred.    07/28/2018 Cancer Staging   Staging form: Colon and Rectum, AJCC 8th Edition - Clinical stage from 07/28/2018: Stage IVB (cTX, cNX, pM1b) - Signed by Truitt Merle, MD on 08/18/2018   08/04/2018 Initial Diagnosis   Rectal cancer metastasized to liver ([name])   08/16/2018 Pathology Results   Biospy    Diagnosis 08/16/18 Liver, needle/core biopsy - ADENOCARCINOMA. Microscopic Comment By morphology, the findings are compatible with the provided clinical history of colorectal adenocarcinoma. Results reported to [name] on 08/18/2018. Intradepartmental consultation was obtained ([name]).    08/16/2018 Miscellaneous   Foundation 1 genomic testing: MS-stable Tumor mutation burden  3/Mb K-ras/NRAS wild-type BRAF V600 E KDMSA R266Q PTEN T324f* SMAD4 loss TP53 loss     08/18/2018 Procedure   Colonoscopy by Dr. HBenson Norwayshowed malignant appearing partially obstructing tumor in the rectum, 8 cm above anal verge.   08/18/2018 - 09/07/2018 Chemotherapy   CAPOX q3weeks with Xeloda 20071mBID 2 weeks on, 1 weeks off starting 08/18/18.  Swithced treatment due to BRAF mutation.    09/07/2018 - 12/24/2018 Chemotherapy   FOLFOXIRI and avastin every 2 weeks starting 09/07/18. Oxaliplatin held starting with cycle 8 and continue with FOLFIRI and Avastin. D/c due to disease progression.    11/04/2018 Imaging   CT CAP 11/04/18 IMPRESSION: 1. Peripheral filling defect in the right main pulmonary artery extending primarily into the right lower lobe pulmonary artery. While the peripheral location indicates some degree of chronicity of this pulmonary embolus, this is a new finding compared to 07/28/2018. The right ventricular to left ventricular ratio is 0.7, which does not specifically indicate right heart strain. 2. The malignancy appears considerably improved, with reduced size pulmonary nodules; reduced size and increased internal necrosis of the hepatic metastatic lesions; marked improvement in the thoracic adenopathy; reduced conspicuity of the rectal tumor; and significant reduction in perirectal tumor burden/adenopathy. 3. Other imaging findings of potential clinical significance: Aortic Atherosclerosis (ICD10-I70.0). Coronary atherosclerosis. Bilateral renal cysts. Small umbilical  hernia  contains adipose tissue.  Critical Value/emergent results were called by telephone at the time of interpretation on 11/04/2018 at 3:05 pm to [name] , who verbally acknowledged these results.   01/18/2019 Imaging   CT AP W contrast  IMPRESSION: 1. Colonic obstruction resulting from rectal carcinoma. 2. Hepatic metastatic disease which is decreased in size compared with the prior examination of 11/04/2018. 3. Multiple right middle lobe and right lower lobe pulmonary nodules most consistent with metastatic disease.   02/01/2019 - 06/01/2019 Chemotherapy   second line BRAFTOVI(encorafenib) 352m daily and MEKTOVI(binimetinib), 411mq12h and Vectibix q2weeks starting 02/01/19.  -[facility] due to RT  -Stopped on 06/01/19 due to disease progression    02/13/2019 Imaging   CT Chest  IMPRESSION: Small bilateral pulmonary metastases, mildly progressive from January 2020.  Multifocal hepatic metastases, incompletely visualized, favored to be progressive from April 2020.  Aortic Atherosclerosis (ICD10-I70.0).    02/22/2019 - 03/21/2019 Radiation Therapy   -Palliative local radiation  -Holding mektovi (binimetinib) during RT -Due to significant rectal pain, diarrhea and stool incontinence, he stopped RT after 03/13/19. He was only able to finish 1/2 remianing treatments on 03/21/19.    05/31/2019 Imaging   CT CAP W contrast 05/31/19  IMPRESSION: 1. Pulmonary emboli with a saddle embolus. Critical Value/emergent results were called by telephone at the time of interpretation on 05/31/2019 at 12:03 pm to [name] , who verbally acknowledged these results. 2. Slight increase in perirectal soft tissue nodularity with overall worsening in pulmonary and hepatic metastatic disease. Omental nodularity appears slightly improved. 3. New mediastinal and periportal adenopathy. Aortocaval lymph node is not enlarged by CT size criteria but is new and  therefore considered suspicious for metastatic involvement. 4. Aortic atherosclerosis (ICD10-170.0). Coronary artery calcification.   06/08/2019 - 09/04/2019 Chemotherapy   FOLFOXIRI q2weeks starting 06/08/19. Due to fatigue, diarrhea, nausea and low blood counts he was switched to FOLFIRI starting 06/22/19. Postponed the addition of Avastin until C4 on 08/03/19 given recent PE. Stopped 09/04/19 due to disease progression in liver.    08/30/2019 Imaging   CT CAP W Contrast 08/30/19  IMPRESSION: 1. Redemonstrated circumferential mass of the distal rectum (series 2, image 126). There has been interval removal of a previously seen rectal stent. 2. There are however newly enlarged right perirectal lymph nodes, measuring up to 6 mm (series 2, image 121). 3. Multiple bilateral pulmonary nodules are stable or slightly increased in size, the largest index nodule in the right lower lobe measuring 1.2 x 1.2 cm (series 6, image 106), a nodule of the right middle lobe measuring 5 mm, previously 3 mm or smaller (series 6, image 89). 4. Interval enlargement of multiple, somewhat ill-defined low-attenuation liver lesions, an index lesion in the right lobe of the liver measuring 3.8 x 2.8 cm, previously 1.9 x 1.7 cm (series 2, image 47). 5. Interval enlargement of an aortocaval lymph node, measuring 1.3 x 1.1 cm, previously 1.1 x 0.8 cm (series 2, image 76). Interval enlargement in ill-defined portacaval lymph nodes, the largest measuring 2.4 x 1.3 cm, previously 1.5 x 0.9 cm (series 2, image 64). 6. Above constellation of findings is consistent with worsened metastatic disease. 7. Other chronic and incidental findings as detailed above. Aortic Atherosclerosis (ICD10-I70.0).   09/22/2019 - 10/13/2019 Chemotherapy   Regorafinib (Stivarga) 8037maily 3 weeks on/1 week off starting 09/22/19. Stopped 10/05/19 for stent placement.           -Planned to restart at 71m82m  every other day and start Keytruda  q3weeks but Cancelled 10/13/19 due to liver failure.       CURRENT THERAPY:  Palliative and Supportive care  INTERVAL HISTORY:  Elieser Tetrick is here for a follow up. They identified themselves by face to face video. He notes he is doing fairly. He has been tolerating IV antibiotic. He is now on 6 weeks atropinin. He notes his eating is fair. Today he had coffee and toast.  He plans to try to eat more. He notes his pain improved and controlled. He has been able to cut back on pain meds. He only took oxycodone this afternoon.  He has been having 1-2 BM daily and this comes with blood mixed in stool. He notes he is drinking plenty of water.  His wife requests filling out her FMLA form as she plans to be off while she takes care of her husband.    REVIEW OF SYSTEMS:   Constitutional: Denies fevers, chills (+) Low appetite, low food intake, weight loss Eyes: Denies blurriness of vision Ears, nose, mouth, throat, and face: Denies mucositis or sore throat Respiratory: Denies cough, dyspnea or wheezes Cardiovascular: Denies palpitation, chest discomfort or lower extremity swelling Gastrointestinal:  Denies nausea, heartburn (+) Rectal bleeding.  Skin: Denies abnormal skin rashes Lymphatics: Denies new lymphadenopathy or easy bruising Neurological:Denies numbness, tingling or new weaknesses Behavioral/Psych: Mood is stable, no new changes  All other systems were reviewed with the patient and are negative.  MEDICAL HISTORY:  Past Medical History:  Diagnosis Date  . colorectal ca dx'd 07/2018  . Liver metastases ([place]) dx'd 07/2018  . Lung metastases ([place]) dx'd 07/2018    SURGICAL HISTORY: Past Surgical History:  Procedure Laterality Date  . BILIARY STENT PLACEMENT  10/05/2019   Procedure: BILIARY STENT PLACEMENT;  Surgeon: Carol Ada, MD;  Location: [facility] ENDOSCOPY;  Service: Endoscopy;;  . BUBBLE STUDY  10/20/2019   Procedure: BUBBLE STUDY;  Surgeon: Pixie Casino, MD;  Location: [facility]  ENDOSCOPY;  Service: Cardiovascular;;  . COLONIC STENT PLACEMENT N/A 01/19/2019   Procedure: COLONIC STENT PLACEMENT;  Surgeon: Carol Ada, MD;  Location: [facility] ENDOSCOPY;  Service: Endoscopy;  Laterality: N/A;  . ERCP N/A 10/05/2019   Procedure: ENDOSCOPIC RETROGRADE CHOLANGIOPANCREATOGRAPHY (ERCP);  Surgeon: Carol Ada, MD;  Location: [place];  Service: Endoscopy;  Laterality: N/A;  . ESOPHAGOGASTRODUODENOSCOPY (EGD) WITH PROPOFOL N/A 10/05/2019   Procedure: ESOPHAGOGASTRODUODENOSCOPY (EGD) WITH PROPOFOL;  Surgeon: Carol Ada, MD;  Location: [place];  Service: Endoscopy;  Laterality: N/A;  . EUS  10/05/2019   Procedure: UPPER ENDOSCOPIC ULTRASOUND (EUS) LINEAR;  Surgeon: Carol Ada, MD;  Location[facility] ENDOSCOPY;  Service: Endoscopy;;  . FLEXIBLE SIGMOIDOSCOPY N/A 01/19/2019   Procedure: Beryle Quant;  Surgeon: Carol Ada, MD;  Location: [facility] ENDOSCOPY;  Service: Endoscopy;  Laterality: N/A;  . IR IMAGING GUIDED PORT INSERTION  08/16/2018  . IR REMOVAL TUN ACCESS W/ PORT W/O FL MOD SED  10/20/2019  . IR US GUIDE BX ASP/DRAIN  08/16/2018  . REMOVAL OF STONES  10/05/2019   Procedure: REMOVAL OF STONES;  Surgeon: Carol Ada, MD;  Location: [facility] ENDOSCOPY;  Service: Endoscopy;;  . Joan Mayans  10/05/2019   Procedure: Joan Mayans;  Surgeon: Carol Ada, MD;  Location: [place];  Service: Endoscopy;;  . TEE WITHOUT CARDIOVERSION N/A 10/20/2019   Procedure: TRANSESOPHAGEAL ECHOCARDIOGRAM (TEE);  Surgeon: Pixie Casino, MD;  Location: [facility] ENDOSCOPY;  Service: Cardiovascular;  Laterality: N/A;    I have reviewed the social history and family history  with the patient and they are unchanged from previous note.  ALLERGIES:  has No Known Allergies.  MEDICATIONS:  Current Outpatient Medications  Medication Sig Dispense Refill  . acetaminophen (TYLENOL) 500 MG tablet Take 1,000 mg by mouth every 6 (six) hours as needed for moderate pain.    . cefTRIAXone  (ROCEPHIN) IVPB Inject 2 g into the vein daily for 9 days. Indication:  Gram negative bacteremia Last Day of Therapy:  10/29/2019 Labs - Once weekly:  CBC/D and BMP 10 Units 0  . cefTRIAXone 2 g in sodium chloride 0.9 % 100 mL Inject 2 g into the vein daily.    . diphenoxylate-atropine (LOMOTIL) 2.5-0.025 MG tablet Take 1-2 tablets by mouth 4 (four) times daily as needed for diarrhea or loose stools. Up to 8 tabs daily for severe diarrhea (Patient not taking: Reported on 10/14/2019) 60 tablet 2  . dronabinol (MARINOL) 2.5 MG capsule Take 1 capsule (2.5 mg total) by mouth 2 (two) times daily before a meal. 40 capsule 0  . loperamide (IMODIUM A-D) 2 MG tablet Take 2 mg by mouth 4 (four) times daily as needed for diarrhea or loose stools.    Marland Kitchen MAGNESIUM-OXIDE 400 (241.3 Mg) MG tablet TAKE 1/2 TABLET(200 MG) BY MOUTH TWICE DAILY 30 tablet 1  . mirtazapine (REMERON) 7.5 MG tablet Take 1 tablet (7.5 mg total) by mouth at bedtime. 30 tablet 0  . morphine (MS CONTIN) 30 MG 12 hr tablet Take 1 tablet (30 mg total) by mouth every 12 (twelve) hours. 60 tablet 0  . Multiple Vitamin (MULTIVITAMIN WITH MINERALS) TABS tablet Take 1 tablet by mouth daily.    Marland Kitchen oxyCODONE (OXY IR/ROXICODONE) 5 MG immediate release tablet Take 1 tablet (5 mg total) by mouth every 4 (four) hours as needed for severe pain. 60 tablet 0  . potassium chloride SA (KLOR-CON) 20 MEQ tablet Take 1 tablet (20 mEq total) by mouth daily. 30 tablet 2  . prochlorperazine (COMPAZINE) 10 MG tablet Take 10 mg by mouth every 6 (six) hours as needed for nausea or vomiting.    . rivaroxaban (XARELTO) 10 MG TABS tablet Take 1 tablet (10 mg total) by mouth daily. 30 tablet    No current facility-administered medications for this visit.    PHYSICAL EXAMINATION: ECOG PERFORMANCE STATUS: 3 - Symptomatic, >50% confined to bed  No vitals taken today, Exam not performed today   LABORATORY DATA:  I have reviewed the data as listed CBC Latest Ref Rng &  Units 10/20/2019 10/19/2019 10/18/2019  WBC 4.0 - 10.5 K/uL 11.9(H) 11.0(H) 10.3  Hemoglobin 13.0 - 17.0 g/dL 9.5(L) 7.5(L) 8.5(L)  Hematocrit 39.0 - 52.0 % 30.6(L) 25.2(L) 27.9(L)  Platelets 150 - 400 K/uL 91(L) 102(L) 107(L)     CMP Latest Ref Rng & Units 10/20/2019 10/19/2019 10/18/2019  Glucose 70 - 99 mg/dL 114(H) 98 119(H)  BUN 8 - 23 mg/dL 13 14 11   Creatinine 0.61 - 1.24 mg/dL 0.55(L) 0.58(L) 0.69  Sodium 135 - 145 mmol/L 138 136 135  Potassium 3.5 - 5.1 mmol/L 3.7 3.6 3.5  Chloride 98 - 111 mmol/L 104 106 103  CO2 22 - 32 mmol/L 23 22 20(L)  Calcium 8.9 - 10.3 mg/dL 7.9(L) 7.7(L) 8.2(L)  Total Protein 6.5 - 8.1 g/dL 5.7(L) 5.6(L) 6.3(L)  Total Bilirubin 0.3 - 1.2 mg/dL 2.1(H) 1.0 1.6(H)  Alkaline Phos 38 - 126 U/L 1,163(H) 1,178(H) 1,417(H)  AST 15 - 41 U/L 73(H) 67(H) 74(H)  ALT 0 - 44 U/L 65(H) 69(H)  94(H)      RADIOGRAPHIC STUDIES: I have personally reviewed the radiological images as listed and agreed with the findings in the report. No results found.   ASSESSMENT & PLAN:  Billy Mendoza is a 69 y.o. male with    1. Klebsiella bacteremia and Endocarditis, with embolic stroke  -Possibly related to recent CBD stent placement.  -He was treated with IV antibiotics.  -On 1/14 he started having confusion and mild aphasia.  -His 10/19/19 CT of the head showed a 3.5 cm region of the cortical and subcortical low-density at the left frontal vertex consistent with acute infarction.  There was no hemorrhage or mass-effect.  -Pt declined Speech therapy consult in hospital.  -He underwent Brain MRI and TEE procedure on 10/20/19. Unfortunately he has multifocal embolic stroke from endocarditis, prognosis is very poor, possible less than 6 weeks. He is currently on iv imipenem for 6 weeks   2. Supportive Care: Dehydration, Fatigue, low appetite and weight loss, Rectal Bleeding and IDA -He has not been eating adequately and losing weight. His fatigue has been worsening.  -I encouraged  him to eat more calories, carbohydrates and protein. I also encouraged him to increase mobility and nutritional supplements. Continue to drink adequate water.  -He has previously tried Mirtazapine which causes drowsiness. I will start him on Marinol BID, he is agreeable to try. (10/25/19) -He has not been seen by home PT, I will check on this.  -Due to recent rectal bleeding s/p hospitalization. He can reduce Xeloda to 19m daily (10/25/19). Will have home care check labs to determine if he needs Blood transfusion.   3. Proximal rectal adenocarcinoma, metastatic to liver,lungsandnodes, MSS,KRAS/NRAS wild type,BRAF V600E mutation (+) -He was diagnosed in10/2019.He progressed on first-line FOLFOXIRI and avastin and he progressed on second-line oralBRAF/MEK/EGFR inhibitors(Braftovi, Mektovi andVectibix q2weeks)which he started on 02/01/19. -HereceivedRadiationin May 2020 to his rectal tumor -Dueto previous good response in liver,Irestarted him onIV FOLFOXIRIwith lower oxaliplatin dose due to neuropathy on 06/08/19.Due to poor tolerance this was reduced to FOLFIRIstarting 06/22/19 and added Avastin with C4. -Givenmultiple tiny b/l lung nodules, progression of liver and LN metastasis as seen on 08/30/19 CT CAP,I changed toRegorafinib (Stivarga)875mdaily3 weeks on/1 week offstarting 09/22/19. Due to worsening liver function and CBD stent placement, his oral treatment was held.  -We did not restart Stivarga nor start Keytruda due to his liver failure. I do not think more cancer treatment is possible, due to his recent endocarditis and stroke.  We are holding off hospice care while he gets treatment for Endocarditis s/p stroke.    4. Recent CBD stone and Jaundice  -He underwent ERCP by Dr HuBenson Norwayn 10/05/19 for CBD stent placement.   5.History ofbowel obstruction from rectal tumor -s/p stent placement by Dr. HuBenson Norwayn 01/19/2019,he declined loop colostomybut agree to keep surgery as  last resort. Hereceived a course of radiation.  -His 08/30/19 CT shows stent is not present.This was confirmed with 09/2019 Sigmoidoscopy with Dr. HuBenson Norway -Rectal and abdominal pain improved. He will decrease to MS Contin 1537maily and Oxycodone 5mg65m needed.    6. Rectal bleeding -secondary to cancerand anticoagulation -Intermittent, worse with anticoagulation -Due to significant rectal bleeding,Xarelto has been dose adjusted. Due to recent rectal bleeding s/p hospitalization. He can reduce Xeloda to 5mg 45mly (10/25/19)  7. Iron deficiency anemia, secondaryto blood loss from tumor  -He receivedinitial 2 doses ofIV Feraheme on 5/20/20and 03/02/19. -I encouraged him to take oral prenatal vitamin with iron. -He has been  having mild rectal bleeding lately. Last blood transfusion 10/13/19 -Given recent rectal bleeding IV Feraheme was given 09/21/19.   8.History of right PE, RecentAcute saddle PE and left LE DVTin 05/2019 -His PE is likely related to his underlying metastatic colon cancer and chemotherapy, especially Avastin. -He was already on Xarelto but not consistently due to occasional epistaxis and rectal bleeding.  -His 05/2019 PE/DVT was treated withLovenoxinjections BID for 2 months, then returnedto Xarelto 7m. I encouraged him to continue if his rectal bleeding is mild, due to his high risk of thrombosis.Certainly if he develops severe rectal bleeding, will hold or reduce dose.  -Will reduce Xarelto further to 584mdaily (10/25/19).   9. HTN  -TakesAmlodipine, BPusually controlled  10. Goal of care discussion -We again discussed the incurable nature of his cancer, and the overall poor prognosis especially as he is no longer a good candidate for chemo and recent endocarditis and strokes.  -The patient understands the goal of care is palliative. -I recommend DNR/DNI, He agreed to DNR/DNI. Order placed.  -He understands his disease is incurable,and the goal of  therapy is palliative.  Plan -Will fill out his wife's FMLA while she takes care of patient.  -I called in Marinol 2.39m439mid for his anorexia  -continue home care and IV antibiotics  -refilled oxycodone today, will reduce MS Contin to 15 mg twice daily, due to his drowsiness. I will refill MS contin later this week based on his pain control  -Virtual Visit in 1 week    No problem-specific Assessment & Plan notes found for this encounter.   No orders of the defined types were placed in this encounter.  I discussed the assessment and treatment plan with the patient. The patient was provided an opportunity to ask questions and all were answered. The patient agreed with the plan and demonstrated an understanding of the instructions.  The patient was advised to call back or seek an in-person evaluation if the symptoms worsen or if the condition fails to improve as anticipated.  The total time spent in the appointment was 22 minutes.    YanTruitt MerleD 10/25/2019   I, AmoJoslyn Devonm acting as scribe for YanTruitt MerleD.   I have reviewed the above documentation for accuracy and completeness, and I agree with the above.

## 2019-10-26 ENCOUNTER — Telehealth: Payer: Self-pay

## 2019-10-26 NOTE — Telephone Encounter (Signed)
Referral faxed to [facility] at [phone] for physical therapy referral, evaluate and treat.

## 2019-10-30 ENCOUNTER — Telehealth: Payer: Self-pay

## 2019-10-30 ENCOUNTER — Other Ambulatory Visit: Payer: Self-pay | Admitting: Hematology

## 2019-10-30 ENCOUNTER — Other Ambulatory Visit: Payer: Self-pay

## 2019-10-30 DIAGNOSIS — C20 Malignant neoplasm of rectum: Secondary | ICD-10-CM

## 2019-10-30 MED ORDER — MORPHINE SULFATE ER 30 MG PO TBCR: 30.0000 mg | EXTENDED_RELEASE_TABLET | Freq: Two times a day (BID) | ORAL | 0 refills | Status: AC

## 2019-10-30 NOTE — Progress Notes (Signed)
Referral to hospice 

## 2019-10-30 NOTE — Telephone Encounter (Signed)
Mrs Harben called this am. She states that Billy Mendoza is having intermittent confusion at night, he is refusing all of his medications except pain medications.  He has become increasingly weak, he is not eating only drinking water and gatorade.  She is requesting hospice services.  I told her we use [facility].  I have called in the referral as well as faxed last ov, demographics sheet, and insurance cards. Mrs. Mccravy asked if it would be possible for him to travel back to [place].  I told Ms Bollmann Per Dr. Burr Medico Billy Eckerd would not be able to tolerate a trip to [facility]  She verbalized understanding.  I encouraged her to call with any questions or concerns.

## 2019-11-08 ENCOUNTER — Other Ambulatory Visit: Payer: Self-pay | Admitting: Hematology

## 2019-11-09 ENCOUNTER — Telehealth: Payer: Self-pay | Admitting: *Deleted

## 2019-11-09 NOTE — Telephone Encounter (Signed)
Received fax from [employer] that pt expired 11-22-19 @ 4:20 am.  Message to MD/Feng & HIM

## 2019-11-10 ENCOUNTER — Telehealth: Payer: Self-pay | Admitting: *Deleted

## 2019-11-10 NOTE — Telephone Encounter (Signed)
Daughter's FMLA form successfully faxed to [facility] [phone]) at [phone].  Original copy mailed to patient address on file.

## 2019-11-11 ENCOUNTER — Other Ambulatory Visit: Payer: Self-pay | Admitting: Hematology

## 2019-12-04 DEATH — deceased

## 2020-08-31 IMAGING — MR MR HEAD W/O CM
7 of 8 series · 30 of 48 positions shown · non-contrast
Comparison: Head CT yesterday

CLINICAL DATA: Follow-up stroke.  New onset confusion.

EXAM:
MRI HEAD WITHOUT CONTRAST
MRA HEAD WITHOUT CONTRAST
TECHNIQUE: Multiplanar, multiecho pulse sequences of the brain and surrounding
structures were obtained without intravenous contrast. Angiographic
images of the head were obtained using MRA technique without
contrast.

[Series 3: T1 · sagittal · 5.0mm · 0.47mm/px · 2 of 21 slices shown (1 of 2)]
[im 1/21]
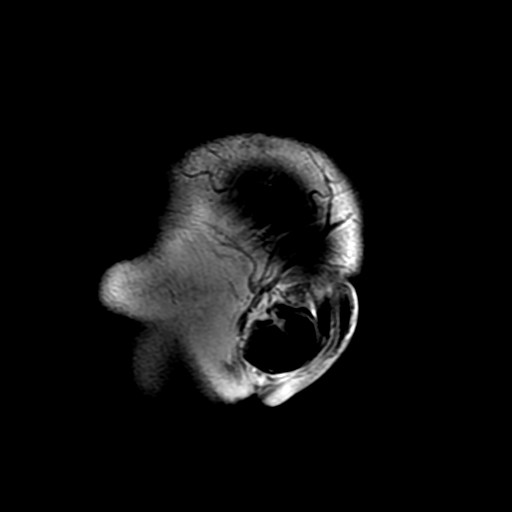
[im 21/21]
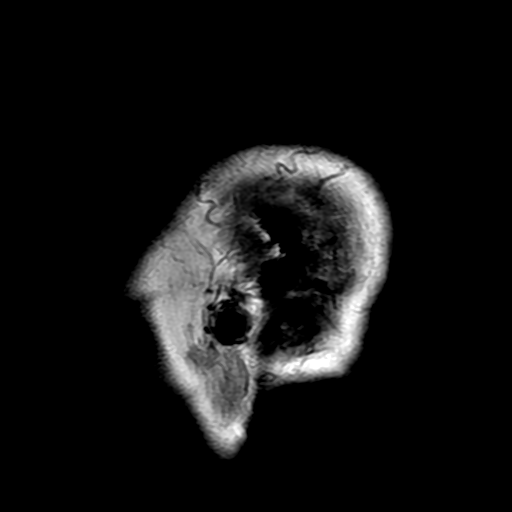

[Series 4: DWI · axial · 3.0mm · 1.09mm/px · z∈[-8,+124]mm · 9 of 96 slices shown (1 of 2)]
[im 1/96]
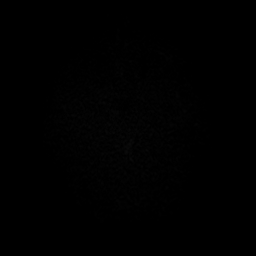
[im 12/96]
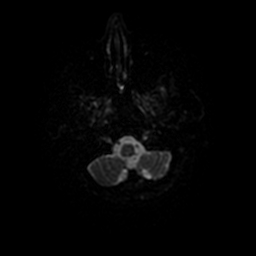
[im 24/96]
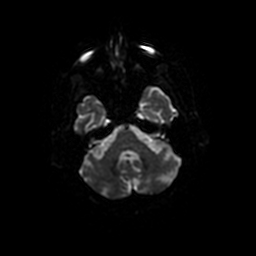
[im 36/96]
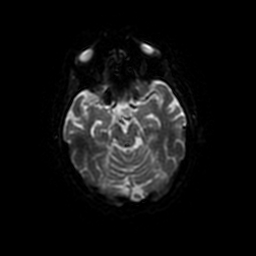
[im 48/96]
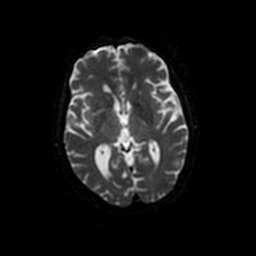
[im 60/96]
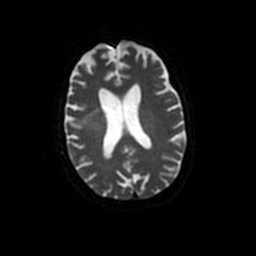
[im 72/96]
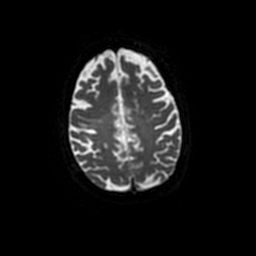
[im 84/96]
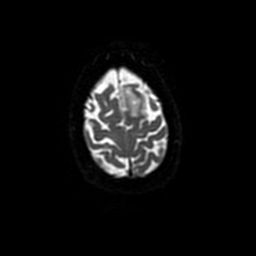
[im 96/96]
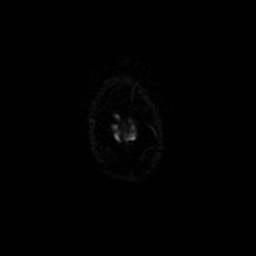

[Series 5: ***fast (id) mt · axial · 1.4mm · 0.41mm/px · z∈[-15,+15]mm · 3 of 142 slices shown]
[im 1/142]
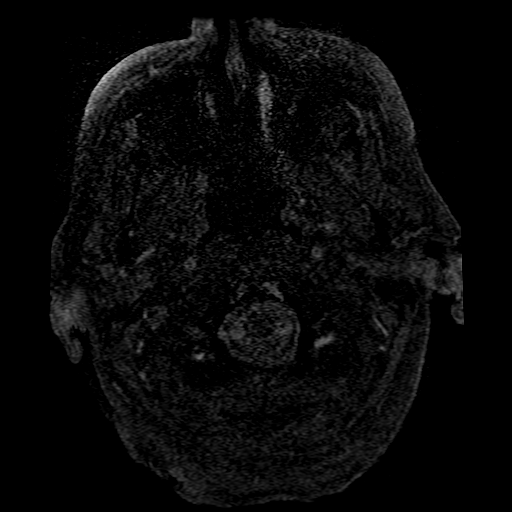
[im 24/142]
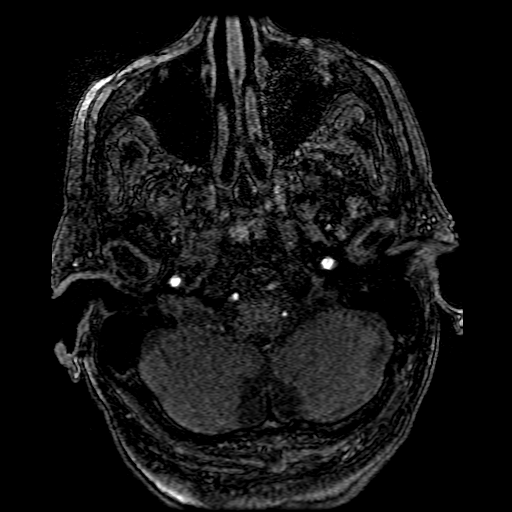
[im 48/142]
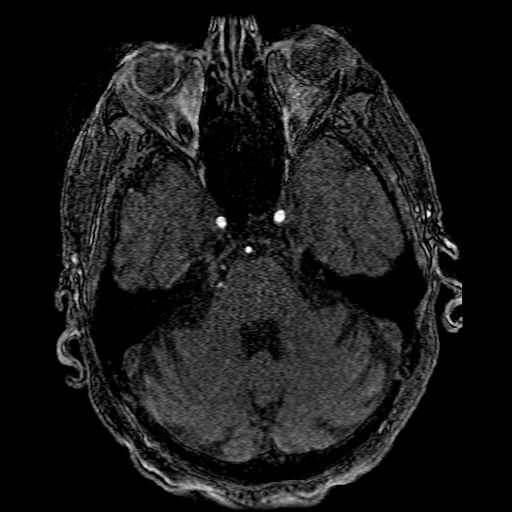

[Series 6: T2 · axial · 5.0mm · 0.94mm/px · z∈[-13,+117]mm · 2 of 21 slices shown]
[im 1/21]
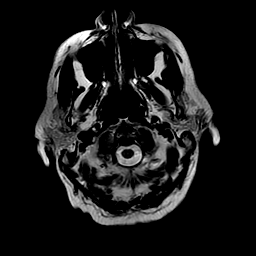
[im 21/21]
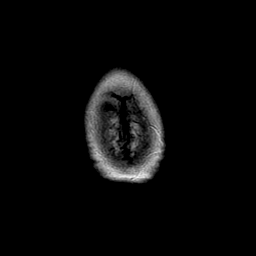

[Series 7: FLAIR · axial · 3.0mm · 0.94mm/px · z∈[-15,+119]mm · 2 of 25 slices shown]
[im 1/25]
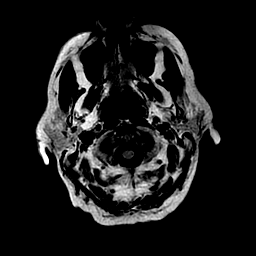
[im 25/25]
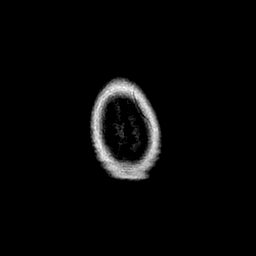

[Series 9: T1 · axial · 1.0mm · 0.47mm/px · z∈[-9,+118]mm · 8 of 138 slices shown (2 of 2)]
[im 1/138]
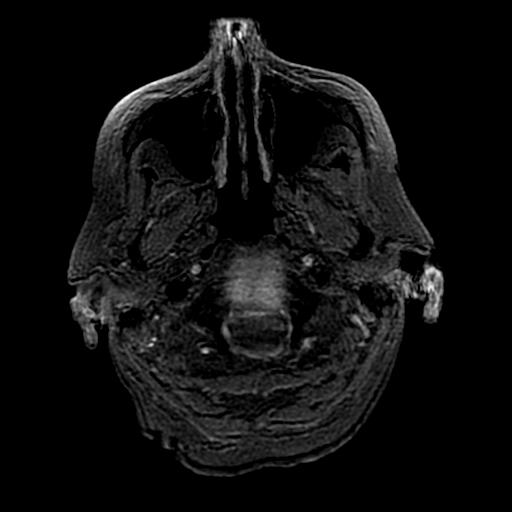
[im 23/138]
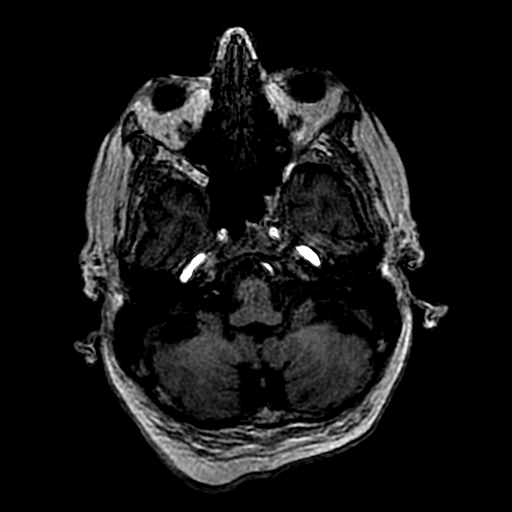
[im 46/138]
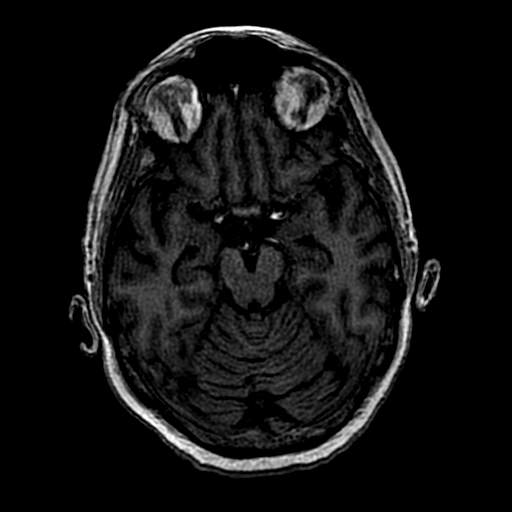
[im 58/138]
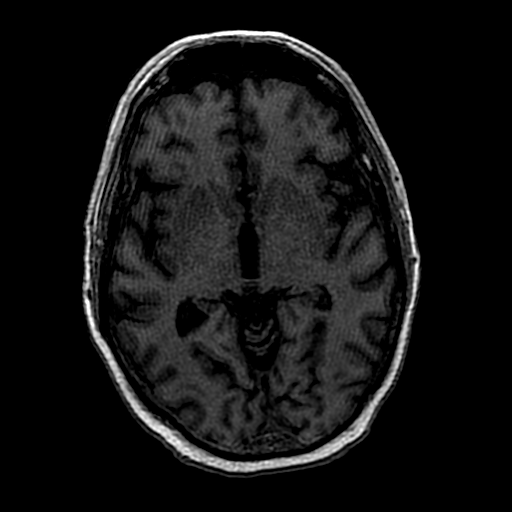
[im 80/138]
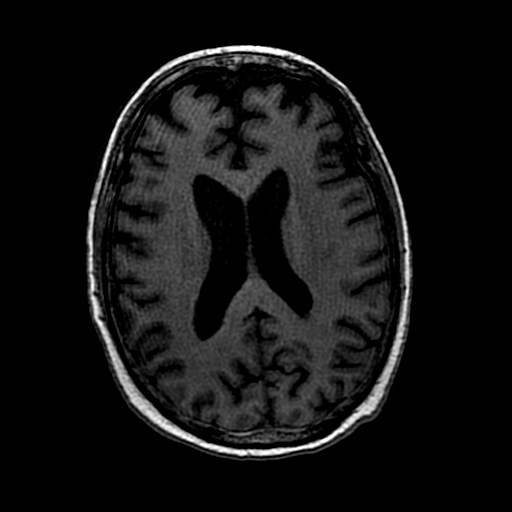
[im 92/138]
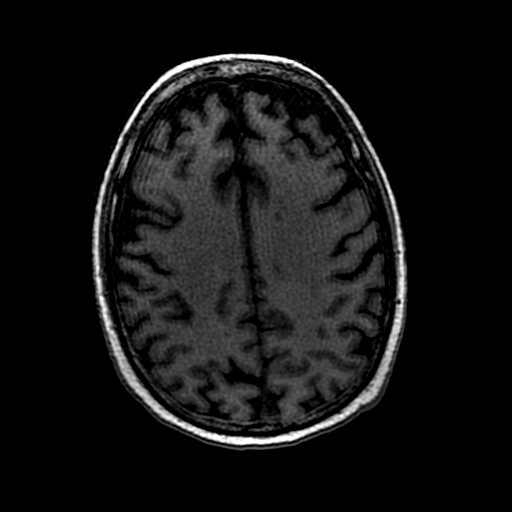
[im 115/138]
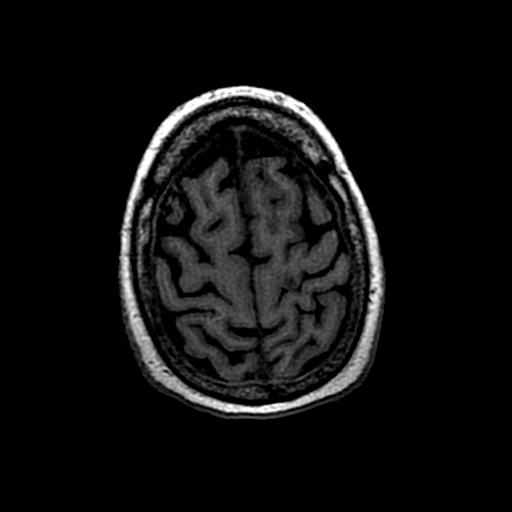
[im 138/138]
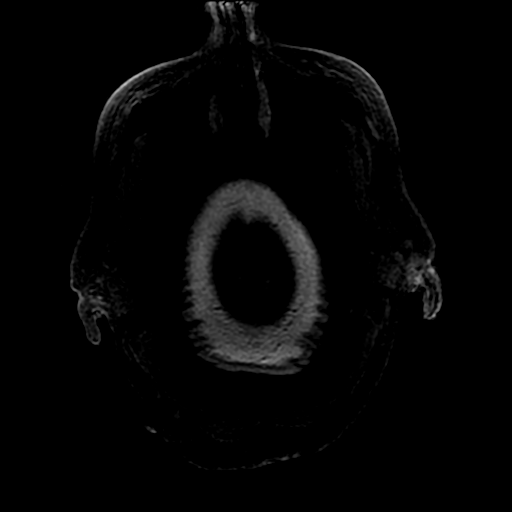

[Series 400: DWI · axial · 3.0mm · 1.09mm/px · z∈[-8,+124]mm · 4 of 48 slices shown (2 of 2)]
[im 1/48]
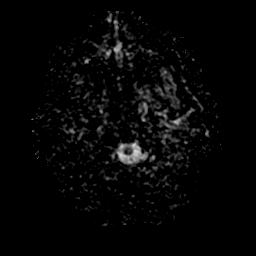
[im 16/48]
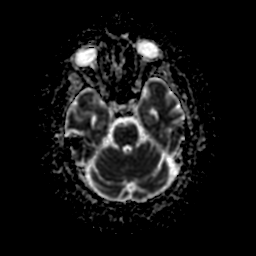
[im 32/48]
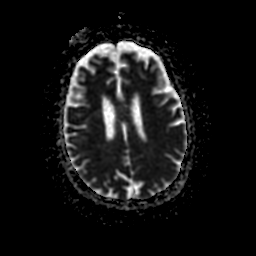
[im 48/48]
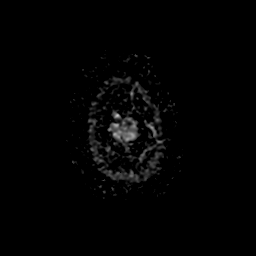

[30 of 48 positions shown; findings below may reference images not displayed]

FINDINGS: MRI HEAD FINDINGS

Brain: Diffusion imaging confirms multiple foci of acute infarction.
Punctate focus of acute infarction in the right medial posterior
cerebellum. Scattered small foci of acute infarction in the right
frontoparietal brain, including involvement of the posterior frontal
gyrus visible on the previous CT. Largest acute infarction is a
confluent infarction at the left frontal vertex measuring up to 4 cm
in size. Few other smaller acute infarctions at the left vertex.
This pattern of disease suggests embolic disease from the heart or
ascending aorta.

Elsewhere, there are minor chronic small-vessel ischemic changes of
the white matter. No evidence of mass, hemorrhage, hydrocephalus or
extra-axial collection.

Vascular: Major vessels at the base of the brain show flow.

Skull and upper cervical spine: Negative

Sinuses/Orbits: Clear/normal

Other: None

MRA HEAD FINDINGS

Both internal carotid arteries are widely patent through the skull
base and siphon regions. The anterior and middle cerebral vessels
are patent without proximal stenosis, aneurysm or vascular
malformation. No large or medium vessel occlusion identified.
Duplicated MCA on the right. Mild atherosclerotic irregularity of
the more distal ACA and MCA branches.

Both vertebral arteries are patent with the right being dominant. No
basilar stenosis. Posterior circulation branch vessels show flow.
Mild atherosclerotic irregularity of the more distal PCA branches.
IMPRESSION: Multiple acute infarctions including the right cerebellum, right
frontoparietal brain and left frontoparietal brain. The largest
infarction is a 4 cm stroke in the left frontal vertex region. This
pattern of disease suggests emboli from the heart or ascending
aorta. No evidence of hemorrhage or mass effect.

MR angiography does not show any large or medium vessel occlusion or
correctable proximal stenosis. Distal vessel atherosclerotic
irregularity is noted.
